# Patient Record
Sex: Male | Born: 1955 | Race: White | Hispanic: No | Marital: Single | Smoking: Former smoker
Health system: Southern US, Community
[De-identification: ages and names within clinical notes are randomized; demographics above are authoritative.]

## PROBLEM LIST (undated history)

## (undated) DIAGNOSIS — I10 Essential (primary) hypertension: Secondary | ICD-10-CM

## (undated) DIAGNOSIS — I428 Other cardiomyopathies: Secondary | ICD-10-CM

## (undated) DIAGNOSIS — I2510 Atherosclerotic heart disease of native coronary artery without angina pectoris: Secondary | ICD-10-CM

## (undated) DIAGNOSIS — N400 Enlarged prostate without lower urinary tract symptoms: Secondary | ICD-10-CM

## (undated) DIAGNOSIS — I4891 Unspecified atrial fibrillation: Secondary | ICD-10-CM

## (undated) DIAGNOSIS — I5020 Unspecified systolic (congestive) heart failure: Secondary | ICD-10-CM

## (undated) DIAGNOSIS — I700 Atherosclerosis of aorta: Secondary | ICD-10-CM

## (undated) DIAGNOSIS — J439 Emphysema, unspecified: Secondary | ICD-10-CM

## (undated) DIAGNOSIS — N183 Chronic kidney disease, stage 3 unspecified: Secondary | ICD-10-CM

## (undated) DIAGNOSIS — Z87891 Personal history of nicotine dependence: Secondary | ICD-10-CM

## (undated) DIAGNOSIS — E785 Hyperlipidemia, unspecified: Secondary | ICD-10-CM

## (undated) DIAGNOSIS — M7020 Olecranon bursitis, unspecified elbow: Secondary | ICD-10-CM

---

## 1977-02-14 HISTORY — PX: HAND SURGERY: SHX662

## 1998-01-05 ENCOUNTER — Ambulatory Visit (HOSPITAL_COMMUNITY): Admission: RE | Admit: 1998-01-05 | Discharge: 1998-01-05 | Payer: Self-pay | Admitting: *Deleted

## 1999-01-25 ENCOUNTER — Ambulatory Visit (HOSPITAL_COMMUNITY): Admission: RE | Admit: 1999-01-25 | Discharge: 1999-01-25 | Payer: Self-pay | Admitting: *Deleted

## 2000-11-09 ENCOUNTER — Encounter: Payer: Self-pay | Admitting: Internal Medicine

## 2000-11-09 ENCOUNTER — Ambulatory Visit (HOSPITAL_COMMUNITY): Admission: RE | Admit: 2000-11-09 | Discharge: 2000-11-09 | Payer: Self-pay | Admitting: Internal Medicine

## 2001-07-12 ENCOUNTER — Ambulatory Visit (HOSPITAL_COMMUNITY): Admission: RE | Admit: 2001-07-12 | Discharge: 2001-07-12 | Payer: Self-pay | Admitting: Internal Medicine

## 2001-07-12 ENCOUNTER — Encounter: Payer: Self-pay | Admitting: Internal Medicine

## 2002-02-21 ENCOUNTER — Ambulatory Visit (HOSPITAL_COMMUNITY): Admission: RE | Admit: 2002-02-21 | Discharge: 2002-02-21 | Payer: Self-pay | Admitting: Internal Medicine

## 2002-02-21 ENCOUNTER — Encounter: Payer: Self-pay | Admitting: Internal Medicine

## 2003-02-26 ENCOUNTER — Ambulatory Visit (HOSPITAL_COMMUNITY): Admission: RE | Admit: 2003-02-26 | Discharge: 2003-02-26 | Payer: Self-pay | Admitting: Internal Medicine

## 2010-02-14 DIAGNOSIS — I219 Acute myocardial infarction, unspecified: Secondary | ICD-10-CM

## 2010-02-14 HISTORY — DX: Acute myocardial infarction, unspecified: I21.9

## 2010-11-15 DIAGNOSIS — I428 Other cardiomyopathies: Secondary | ICD-10-CM

## 2010-11-15 HISTORY — DX: Other cardiomyopathies: I42.8

## 2010-12-08 ENCOUNTER — Inpatient Hospital Stay (HOSPITAL_COMMUNITY)
Admission: EM | Admit: 2010-12-08 | Discharge: 2010-12-11 | DRG: 286 | Disposition: A | Discharge status: Home / Self Care | Payer: Managed Care, Other (non HMO) | Source: Ambulatory Visit | Attending: Internal Medicine | Admitting: Internal Medicine

## 2010-12-08 ENCOUNTER — Emergency Department (HOSPITAL_COMMUNITY): Payer: Managed Care, Other (non HMO)

## 2010-12-08 DIAGNOSIS — R0602 Shortness of breath: Secondary | ICD-10-CM

## 2010-12-08 DIAGNOSIS — R0789 Other chest pain: Secondary | ICD-10-CM | POA: Diagnosis present

## 2010-12-08 DIAGNOSIS — Z7982 Long term (current) use of aspirin: Secondary | ICD-10-CM

## 2010-12-08 DIAGNOSIS — R0609 Other forms of dyspnea: Secondary | ICD-10-CM

## 2010-12-08 DIAGNOSIS — Z87891 Personal history of nicotine dependence: Secondary | ICD-10-CM

## 2010-12-08 DIAGNOSIS — I2510 Atherosclerotic heart disease of native coronary artery without angina pectoris: Secondary | ICD-10-CM | POA: Diagnosis present

## 2010-12-08 DIAGNOSIS — J9600 Acute respiratory failure, unspecified whether with hypoxia or hypercapnia: Secondary | ICD-10-CM | POA: Diagnosis present

## 2010-12-08 DIAGNOSIS — R0989 Other specified symptoms and signs involving the circulatory and respiratory systems: Secondary | ICD-10-CM

## 2010-12-08 DIAGNOSIS — I5023 Acute on chronic systolic (congestive) heart failure: Principal | ICD-10-CM | POA: Diagnosis present

## 2010-12-08 DIAGNOSIS — E785 Hyperlipidemia, unspecified: Secondary | ICD-10-CM | POA: Diagnosis present

## 2010-12-08 DIAGNOSIS — N400 Enlarged prostate without lower urinary tract symptoms: Secondary | ICD-10-CM | POA: Diagnosis present

## 2010-12-08 DIAGNOSIS — I428 Other cardiomyopathies: Secondary | ICD-10-CM | POA: Diagnosis present

## 2010-12-08 DIAGNOSIS — I509 Heart failure, unspecified: Secondary | ICD-10-CM | POA: Diagnosis present

## 2010-12-08 LAB — CBC
HCT: 42.2 % (ref 39.0–52.0)
Hemoglobin: 14.7 g/dL (ref 13.0–17.0)
MCH: 31.7 pg (ref 26.0–34.0)
MCHC: 34.8 g/dL (ref 30.0–36.0)
RDW: 13.5 % (ref 11.5–15.5)

## 2010-12-08 LAB — CARDIAC PANEL(CRET KIN+CKTOT+MB+TROPI)
CK, MB: 6 ng/mL — ABNORMAL HIGH (ref 0.3–4.0)
CK, MB: 4.8 ng/mL — ABNORMAL HIGH (ref 0.3–4.0)
Relative Index: 2.4 (ref 0.0–2.5)
Troponin I: <0.3 ng/mL (ref <0.30)
Troponin I: <0.3 ng/mL (ref <0.30)

## 2010-12-08 LAB — LIPID PANEL
Cholesterol: 264 mg/dL — ABNORMAL HIGH (ref 0–200)
Triglycerides: 132 mg/dL (ref <150)

## 2010-12-08 LAB — BASIC METABOLIC PANEL
BUN: 19 mg/dL (ref 6–23)
CO2: 21 mEq/L (ref 19–32)
Chloride: 111 mEq/L (ref 96–112)
Creatinine, Ser: 1.11 mg/dL (ref 0.50–1.35)

## 2010-12-08 LAB — DIFFERENTIAL
Basophils Absolute: 0 10*3/uL (ref 0.0–0.1)
Basophils Relative: 0 % (ref 0–1)
Eosinophils Absolute: 0.1 10*3/uL (ref 0.0–0.7)
Eosinophils Relative: 1 % (ref 0–5)
Lymphocytes Relative: 21 % (ref 12–46)
Lymphs Abs: 2.3 10*3/uL (ref 0.7–4.0)
Monocytes Absolute: 0.6 10*3/uL (ref 0.1–1.0)
Monocytes Relative: 5 % (ref 3–12)
Neutro Abs: 7.6 10*3/uL (ref 1.7–7.7)
Neutrophils Relative %: 72 % (ref 43–77)

## 2010-12-08 LAB — POCT I-STAT TROPONIN I: Troponin i, poc: 0.06 ng/mL (ref 0.00–0.08)

## 2010-12-08 LAB — TSH: TSH: 0.945 u[IU]/mL (ref 0.350–4.500)

## 2010-12-08 MED ORDER — IOHEXOL 300 MG/ML  SOLN
80.0000 mL | Freq: Once | INTRAMUSCULAR | Status: AC | PRN
  Administered 2010-12-08: 80 mL via INTRAVENOUS

## 2010-12-09 LAB — BASIC METABOLIC PANEL
BUN: 19 mg/dL (ref 6–23)
CO2: 25 mEq/L (ref 19–32)
Calcium: 9.9 mg/dL (ref 8.4–10.5)
Chloride: 105 mEq/L (ref 96–112)
Creatinine, Ser: 1.36 mg/dL — ABNORMAL HIGH (ref 0.50–1.35)
Creatinine, Ser: 1.13 mg/dL (ref 0.50–1.35)
GFR calc Af Amer: 65 mL/min — ABNORMAL LOW (ref >90)
Glucose, Bld: 97 mg/dL (ref 70–99)
Potassium: 4.2 mEq/L (ref 3.5–5.1)

## 2010-12-09 LAB — URINE DRUGS OF ABUSE SCREEN W ALC, ROUTINE (REF LAB)
Barbiturate Quant, Ur: NEGATIVE
Benzodiazepines.: NEGATIVE
Ethyl Alcohol: <10 mg/dL (ref <10)
Phencyclidine (PCP): NEGATIVE

## 2010-12-09 LAB — PRO B NATRIURETIC PEPTIDE: Pro B Natriuretic peptide (BNP): 1680 pg/mL — ABNORMAL HIGH (ref 0–125)

## 2010-12-09 LAB — CBC
Platelets: 182 10*3/uL (ref 150–400)
RDW: 13.7 % (ref 11.5–15.5)
WBC: 9 10*3/uL (ref 4.0–10.5)

## 2010-12-09 LAB — CARDIAC PANEL(CRET KIN+CKTOT+MB+TROPI): Troponin I: <0.3 ng/mL (ref <0.30)

## 2010-12-10 DIAGNOSIS — I5021 Acute systolic (congestive) heart failure: Secondary | ICD-10-CM

## 2010-12-10 LAB — BASIC METABOLIC PANEL
BUN: 21 mg/dL (ref 6–23)
Calcium: 9.9 mg/dL (ref 8.4–10.5)
Creatinine, Ser: 1.31 mg/dL (ref 0.50–1.35)
GFR calc non Af Amer: 59 mL/min — ABNORMAL LOW (ref >90)
Glucose, Bld: 99 mg/dL (ref 70–99)

## 2010-12-10 LAB — POCT I-STAT 3, VENOUS BLOOD GAS (G3P V)
Bicarbonate: 24.4 mEq/L — ABNORMAL HIGH (ref 20.0–24.0)
TCO2: 26 mmol/L (ref 0–100)

## 2010-12-10 LAB — POCT I-STAT 3, ART BLOOD GAS (G3+)
Acid-base deficit: 1 mmol/L (ref 0.0–2.0)
O2 Saturation: 91 %
TCO2: 24 mmol/L (ref 0–100)

## 2010-12-10 NOTE — H&P (Signed)
Austin [name], Austin [name]              ACCOUNT NO.:  [id number]  MEDICAL RECORD NO.:  [id number]  LOCATION:  3731                         FACILITY:  [facility]  PHYSICIAN:  Isidor Holts, M.D.  DATE OF BIRTH:  [birth year]  DATE OF ADMISSION:  12/08/2010 DATE OF DISCHARGE:                             HISTORY & PHYSICAL   PRIMARY CARE PHYSICIAN:  None.  Previously was with Dr. Lovenia Kim at [facility].  CHIEF COMPLAINT:  Chest pain, shortness of breath, and cough productive of yellowish/blood-streaked phlegm for the past 4 days.  HISTORY OF PRESENT ILLNESS:  This is a 56 year old male.  He is an excellent historian.  According to him, over the past 4 days, he has experienced increasing shortness of breath while doing his regular work. He works as a Product manager, making deliveries, and according to him he has been unable to "catch my breath" associated with chest discomfort, but no dysphagia.  He has also noticed retrosternal and left- sided chest pain on and off for the past 4 days, duration approximately 8-10 minutes at each time, partially relieved by cough, but immediately recurrent.  The cough is productive of yellowish phlegm with blood streaks.  He denies any chills, but has felt "feverish".  Denies any sick contacts.  Has had no lower extremity pain or swelling.  He also has occasional abdominal pain at the same time as his chest pain, but has no diarrhea or vomiting.  He has tried to continue working through today, since he will be off from tomorrow; however, at about 06:20 a.m. while at [facility] making deliveries, he was so symptomatic that a member of the [facility] staff called the [facility] police, who then called emergency medical services and the patient was brought to the emergency department at [facility], where his chest pain responded to sublingual nitroglycerin x2.  Chest CT angiogram done on suspicion of possible pulmonary embolism, was negative for such,  although there were some other abnormalities; for details see below.  The patient was then referred to the medical service for admission.  PAST MEDICAL HISTORY: 1. Benign prostatic hypertrophy. 2. Dyslipidemia. 3. Status post left hand surgery for left hand machine trauma     approximately 33 years ago.  ALLERGIES:  NO KNOWN DRUG ALLERGIES.  MEDICATIONS:  The patient is not currently on any medication.  REVIEW OF SYSTEMS:  Systems review as per HPI and chief complaint.  The patient denies headache.  Denies photophobia or neck stiffness.  Denies vomiting or diarrhea.  Denies back pain.  The rest of systems review is negative.  SOCIAL HISTORY:  The patient is single, has no offspring.  He works as a Product manager.  Ex-smoker, quit smoking approximately 7 years ago.  Prior to that, he was smoking 2 packets of cigarettes per day for the past 14 years.  Drinks alcohol only very occasionally, maybe once a month.  Has no history of drug abuse.  FAMILY HISTORY:  The patient's mother died at age 17 years, from breast cancer.  She had no other known medical problems.  His father died when he was at a very young age, so he does not know his  father's history.  He does have 2 younger brothers, 1 died at age 49 years from a massive heart attack.  The second is aged 69 years now.  He had an MI at age 46 years.  PHYSICAL EXAMINATION:  VITAL SIGNS:  Temperature 97.6, pulse 90 per minute regular, respiratory rate 19, BP 127/92 mmHg, rechecked 145/92 mmHg, pulse oximeter 99% on room air. GENERAL:  The patient appeared somewhat anxious, mildly short of breath on exertion at the time of this evaluation.  Alert and communicative. HEENT:  No clinical pallor.  No jaundice.  No conjunctival injection. Throat is clear. NECK:  Supple.  JVP not seen.  No palpable lymphadenopathy.  No palpable goiter. CHEST:  Clinically clear to auscultation.  No wheezes.  No crackles. CARDIOVASCULAR:  Heart  sounds 1 and 2 heard, normal.  Regular, mildly tachycardic.  No murmurs. ABDOMEN:  Full, soft, and nontender.  No palpable organomegaly.  No palpable masses.  Normal bowel sounds. EXTREMITIES:  Lower extremity examination, no pitting edema.  Palpable peripheral pulses. MUSCULOSKELETAL SYSTEM:  Appears quite unremarkable. CENTRAL NERVOUS SYSTEM:  No focal neurologic deficits on gross examination.  INVESTIGATIONS:  CBC WBC 10.6, hemoglobin 14.7, hematocrit 42.2, and platelets 209.  Electrolytes sodium 142, potassium 3.8, chloride 111, CO2 21, BUN 19, creatinine 1.11, and glucose 110.  Troponin I point of care 0.07.  Chest x-ray from December 08, 2010, shows left lower lobe atelectasis versus infiltrate as well as small bilateral pleural effusions.  Chest CT angiogram December 08, 2010, shows no evidence of pulmonary embolism, but there were moderate bilateral pleural effusions with airspace disease likely secondary to atelectasis.  There is minimal focal opacification of the right upper lobe.  A 12-lead EKG December 08, 2010, shows sinus rhythm, regular, normal axis, no acute ischemic findings.  Rate 100 per minute.  ASSESSMENT AND PLAN: 1. Community-acquired pneumonia.  The patient has borderline elevated     white cell count, subjective fevers, productive cough with yellow     phlegm, and blood streaks, also suspicious chest x-ray and chest CT     angiogram.  We shall therefore commence the patient on intravenous     Rocephin and azithromycin.  Manage this with albuterol nebulizers.     For completeness, we shall send blood cultures.  2. Bilateral moderate pleural effusions seen on chest CT angiogram.     Etiology unclear.  Might be parapneumonic and may be contributory     to shortness of breath.  Clinically, the patient does not appear to     have congestive heart failure; however, we will discuss with     Radiologist and possibly consider diagnostic/therapeutic     thoracentesis.   For completeness, we shall check TSH.  3. Chest pain.  This has atypical features and I suspect it may be     secondary to community-acquired pneumonia.  However, the patient     has had relief with sublingual nitroglycerin, and has risk factors     including dyslipidemia, positive family history of early coronary     artery disease.  He will therefore need stratification.  We shall     cycle cardiac enzymes, monitor telemetrically, and continue     Nitropaste for now.  Do a 2D echocardiogram.    Further management will depend on clinical course.     Isidor Holts, M.D.    CO/MEDQ  D:  12/08/2010  T:  12/08/2010  Job:  [id number]  Electronically Signed by Isidor Holts M.D. on  12/10/2010 07:04:53 PM

## 2010-12-11 ENCOUNTER — Inpatient Hospital Stay (HOSPITAL_COMMUNITY): Payer: Managed Care, Other (non HMO)

## 2010-12-11 LAB — BASIC METABOLIC PANEL
BUN: 22 mg/dL (ref 6–23)
CO2: 27 mEq/L (ref 19–32)
Calcium: 10.1 mg/dL (ref 8.4–10.5)
Chloride: 104 mEq/L (ref 96–112)
Creatinine, Ser: 1.31 mg/dL (ref 0.50–1.35)
Glucose, Bld: 106 mg/dL — ABNORMAL HIGH (ref 70–99)

## 2010-12-14 LAB — CULTURE, BLOOD (ROUTINE X 2)
Culture  Setup Time: 201210242101
Culture: NO GROWTH

## 2010-12-17 ENCOUNTER — Ambulatory Visit (HOSPITAL_COMMUNITY)
Admit: 2010-12-17 | Discharge: 2010-12-17 | Disposition: A | Discharge status: Home / Self Care | Payer: Managed Care, Other (non HMO) | Source: Ambulatory Visit | Attending: Internal Medicine | Admitting: Internal Medicine

## 2010-12-17 ENCOUNTER — Encounter (HOSPITAL_COMMUNITY): Payer: Self-pay

## 2010-12-17 ENCOUNTER — Encounter (HOSPITAL_COMMUNITY): Payer: Self-pay | Admitting: *Deleted

## 2010-12-17 VITALS — BP 88/62 | HR 65 | Ht 76.0 in | Wt 217.1 lb

## 2010-12-17 DIAGNOSIS — I5022 Chronic systolic (congestive) heart failure: Secondary | ICD-10-CM | POA: Insufficient documentation

## 2010-12-17 DIAGNOSIS — I959 Hypotension, unspecified: Secondary | ICD-10-CM | POA: Insufficient documentation

## 2010-12-17 DIAGNOSIS — I5023 Acute on chronic systolic (congestive) heart failure: Secondary | ICD-10-CM | POA: Insufficient documentation

## 2010-12-17 HISTORY — DX: Other cardiomyopathies: I42.8

## 2010-12-17 HISTORY — DX: Benign prostatic hyperplasia without lower urinary tract symptoms: N40.0

## 2010-12-17 HISTORY — DX: Hyperlipidemia, unspecified: E78.5

## 2010-12-17 LAB — BASIC METABOLIC PANEL
BUN: 40 mg/dL — ABNORMAL HIGH (ref 6–23)
CO2: 24 mEq/L (ref 19–32)
Chloride: 101 mEq/L (ref 96–112)
Creatinine, Ser: 1.35 mg/dL (ref 0.50–1.35)
GFR calc Af Amer: 66 mL/min — ABNORMAL LOW (ref >90)
Glucose, Bld: 95 mg/dL (ref 70–99)
Potassium: 5 mEq/L (ref 3.5–5.1)

## 2010-12-17 MED ORDER — POTASSIUM CHLORIDE CRYS ER 20 MEQ PO TBCR: 20.0000 meq | EXTENDED_RELEASE_TABLET | Freq: Every day | ORAL | Status: DC

## 2010-12-17 MED ORDER — LISINOPRIL 10 MG PO TABS: 5.0000 mg | ORAL_TABLET | Freq: Two times a day (BID) | ORAL | Status: DC

## 2010-12-17 MED ORDER — FUROSEMIDE 40 MG PO TABS: 40.0000 mg | ORAL_TABLET | Freq: Every day | ORAL | Status: DC

## 2010-12-17 NOTE — Consult Note (Signed)
Austin [name]              ACCOUNT NO.:  [id number]  MEDICAL RECORD NO.:  [id number]  LOCATION:  3731                         FACILITY:  [facility]  PHYSICIAN:  Austin [name], MDDATE OF BIRTH:  August 31, 1955  DATE OF CONSULTATION:  12/08/2010 DATE OF DISCHARGE:                                CONSULTATION   REQUESTING PHYSICIAN:  Austin Holts, MD  PRIMARY CARE PHYSICIAN:  None.  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  He presented to the hospital with a 4-day history of increasing shortness of breath, dyspnea, and orthopnea.  He said that he is usually quite active and felt fine until over the weekend and over the past few days, he has noticed that he has become more dyspneic with exertion.  He also has been unable to lie flat and having to sit straight up in bed. He also notes that he has had a very severe cough.  He says he has coughed up a couple of episodes of blood-streaked phlegm over the past 24 hours.  He says occasionally, it is yellow, but often clear.  He does not have any chest pain, but he says he feels like something is stuck in his throat.  He denies any lower extremity edema.  He says his weight has been relatively stable.  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  He was brought to the ER.  EKG showed sinus tachycardia.  First set of cardiac markers were normal.  Chest x-ray showed some atelectasis and bilateral pleural effusions.  He subsequently underwent a CT scan of the chest which showed moderate-to- large bilateral pleural effusions and some atelectasis.  A BNP was obtained which was markedly elevated at 1948.  REVIEW OF SYSTEMS:  He denies fevers or chills.  No nausea, no vomiting, no diarrhea.  No rashes.  No joint swelling.   He does have some arthritis pain.  Remainder of review of systems, all systems are negative except for HPI and problem list.  PAST MEDICAL HISTORY: 1. Hyperlipidemia. 2. History of sinus troubles. 3. Benign prostatic hypertrophy.  He went to a cardiologist in [place] about 5 or 10 years ago and had a normal stress test and echocardiogram which were just done as part of screening.  CURRENT MEDICINES:  Just a cholesterol medicine which does not appear like he was taking.  ALLERGIES:  No known drug allergies.  SOCIAL HISTORY:  He lives in [place].  He is single.  He drives a refrigerator truck.  Denies any current tobacco use, he quit 16 years ago.  Alcohol very rarely.  FAMILY HISTORY:  Mother died of breast cancer at age 51.  Father died of bladder cancer at age 2.  Had a brother who died suddenly at age 3 and another brother who had an MI at age 58, brother is currently 60.  PHYSICAL EXAMINATION:  GENERAL:  He is dyspneic at rest. VITAL SIGNS:  Blood pressure is 145/93, his heart rate is  110, his respirations are about 20 per minute, he is sating 97% on 2 L. HEENT:  Normal. NECK:  Supple.  JVP is elevated about 8-9 cm with prominent CV waves. There is also hepatojugular reflux.  Carotids are 1+ bilaterally without bruits.  There is no lymphadenopathy or thyromegaly. CARDIAC:  PMI is laterally displaced.  He is tachycardic and regular. He has a prominent S3. HEART:  His heart sounds are distant, though I do not hear any murmurs. LUNGS:  Bibasilar crackles. ABDOMEN:  Obese, mildly distended.  Liver edge appears down several centimeters.  They are otherwise nontender.  Good bowel sounds. EXTREMITIES:  Warm with no cyanosis, clubbing, or edema.  No rash. NEUROLOGIC:  Alert and oriented x3.  Cranial nerves II through XII are intact.  Moves all 4 extremities without difficulty.  Affect is pleasant.  EKG shows sinus tach at a rate of 100.  There is LVH with a  nonspecific IVCD.  There is left axis deviation.  No acute ST-T-wave abnormalities. First set of cardiac markers; CK 221, MB of 6.1, troponin is 0.07. White count is 10.6, hemoglobin 14.7, platelet 209.  Sodium 143, potassium 3.8, BUN 19, creatinine 1.11, glucose of 110.  Cholesterol is 264, triglycerides 132, HDL 46, LDL 192.  ASSESSMENT: 1. Acute respiratory failure. 2. Acute congestive heart failure, probably systolic. 3. Hyperlipidemia. 4. Bilateral pleural effusions, suspect related to heart failure.  PLAN/DISCUSSION:  I suspect [name] has acute systolic heart failure.  He has significant volume overload.  I do not see any evidence of pneumonia.  We will start with IV Lasix and also ACE inhibitor.  I worry he has significant systolic dysfunction.  We will get an echocardiogram on him hopefully tonight.  I suspect he will also need a right and left heart catheterization once his volume status is improved. I would hold on beta-blocker for now until his respiratory status is improved.  We will go ahead and start digoxin.  TOTAL TIME SPENT:  One hour in reviewing records and examining and discussing with the patient.Austin Buckles. Bensimhon, MD     DRB/MEDQ  D:  12/08/2010  T:  12/08/2010  Job:  [id number]  Electronically Signed by Arvilla Meres MD on 12/17/2010 02:09:45 PM

## 2010-12-17 NOTE — Assessment & Plan Note (Signed)
Asymptomatic, doppler BP 92.  Will decrease medications as above.

## 2010-12-17 NOTE — Patient Instructions (Signed)
Labs today.  Decrease Lasix 40 mg daily. Decrease KCl 20 mEq daily. Decrease Lisinopril 5 mg (0.5 tab) twice daily.    Call if dizziness/lightheadnesses worsens or any questions.  Continue to weigh and record daily weights.  Return for follow up in 2 weeks.

## 2010-12-17 NOTE — Progress Notes (Signed)
HPI:  Austin [name] is a 55 yo gentlemen with history of BPH and hyperlipidemia and recently diagnosed systolic HF, EF 20% in October 2012.  Dr. Shirlee Latch took the pt to the cath lab on 12/10/2010 for right and left heart catheterizations showing: Right atrial pressure 7, RV 23/7, PA 30/17, mean PCWP 18, CO by Fick 5.2, CI by Fick 2.2, CO by thermodilution 3.61 and CI by thermodilution 1.55; RCA normal, left main with mild narrowing distal portion 20%, LAD luminal irregularities, circumflex mild luminal irregularities; EF 15%-20%.  He diuresed well with discharge rate of 102 kg.  He was discharged on Aspirin 81, Carvedilol 3.125 mg twice daily, Digoxin 0.25 mg daily, Lasix 40 mg b.i.d, Lisinopril 10 mg twice daily, Potassium 20 mEq 2 tablets b.i.d, Crestor 40 mg daily and Spironolactone 12.5 mg daily.  Returns for follow up today.  Feels fatigued but doing well.  Feels he does tire easier than prior to the hospital.  Walking about 1 mile daily, sometimes he needs to stop prior to getting back home.  No SOB/orthopnea/PND.  Occasional dizziness with standing.  Nonproductive cough.  Weighing daily at [facility] because he does not have a scale at home, weight constant 221.  He is ready to go back to work as he is not use to sitting around the house all day.    ROS: All systems negative except as listed in HPI, PMH and Problem List.  Past Medical History  Diagnosis Date  . Systolic heart failure Oct 2012    echo 12/08/10 EF 20%, severe left ventricular enlargement, mild right ventricular enlargement  . Nonischemic cardiomyopathy Oct 2012    cath 12/10/10 minimal nonobstructive coronary artery disease  . Hyperlipidemia   . Benign prostatic hypertrophy     Current Outpatient Prescriptions  Medication Sig Dispense Refill  . aspirin 81 MG tablet Take 81 mg by mouth daily.        . carvedilol (COREG) 3.125 MG tablet Take 3.125 mg by mouth 2 (two) times daily with a meal.        . digoxin (LANOXIN) 0.25 MG  tablet Take 250 mcg by mouth daily.        . furosemide (LASIX) 40 MG tablet Take 40 mg by mouth 2 (two) times daily.        Marland Kitchen lisinopril (PRINIVIL,ZESTRIL) 10 MG tablet Take 10 mg by mouth 2 (two) times daily.        . potassium chloride SA (K-DUR,KLOR-CON) 20 MEQ tablet Take 40 mEq by mouth 2 (two) times daily.        . rosuvastatin (CRESTOR) 40 MG tablet Take 40 mg by mouth daily.        Marland Kitchen spironolactone (ALDACTONE) 25 MG tablet Take 12.5 mg by mouth daily.           PHYSICAL EXAM: Filed Vitals:   12/17/10 1250  BP: 88/62  Pulse: 65  Wt 217 Doppler BP:92 Standing BP: 78/64  General:  Well appearing. No resp difficulty HEENT: normal Neck: supple. JVP flat. Carotids 1+ bilaterally; no bruits. No lymphadenopathy or thryomegaly appreciated. Cor: PMI normal. Regular rate & rhythm. No rubs, gallops or murmurs. No S3 Lungs: clear Abdomen: soft, nontender, nondistended. No hepatosplenomegaly. No bruits or masses. Good bowel sounds. Extremities: no cyanosis, clubbing, rash, edema Neuro: alert & orientedx3, cranial nerves grossly intact. Moves all 4 extremities w/o difficulty. Affect pleasant.     ASSESSMENT & PLAN:

## 2010-12-17 NOTE — Assessment & Plan Note (Addendum)
He continues to feel fatigued.  Volume status looks good.  NYHA III.  Would recommend decreasing lasix to 40 mg daily, KCL 20 mEq daily and Lisinopril 5 mg BID with hypotension and dizziness.  Have given patient a scale and discussed the importance of daily weights and recording them daily, he is very agreeable to this.   Patient seen and examined with Ulyess Blossom PA-C. We discussed all aspects of the encounter. I agree with the assessment and plan as stated above.

## 2010-12-20 NOTE — Progress Notes (Signed)
Patient seen and examined with Nicki Bradley PA-C. We discussed all aspects of the encounter. I agree with the assessment and plan as stated above.   

## 2010-12-25 NOTE — Discharge Summary (Signed)
NAMEJONAVON, TRIEU NO.:  [id number]  MEDICAL RECORD NO.:  [id number]  LOCATION:  3731                         FACILITY:  [facility]  PHYSICIAN:  Luis Abed, MD, FACCDATE OF BIRTH:  [birth year]  DATE OF ADMISSION:  12/08/2010 DATE OF DISCHARGE:  12/11/2010                              DISCHARGE SUMMARY   PRIMARY CARDIOLOGIST:  Bevelyn Buckles. Bensimhon, MD with the [facility]  REASON FOR ADMISSION:  Acute congestive heart failure.  DISCHARGE DIAGNOSES: 1. Acute systolic congestive heart failure - compensated prior to     discharge. 2. Nonischemic cardiomyopathy diagnosed this admission. 3. a.  2-D echocardiogram, December 08, 2010:  Severe left ventricular     enlargement, ejection fraction 20%, mild right ventricular     enlargement, mildly reduced right ventricular systolic function,     mild heart right atrial enlargement. 4. Minimal nonobstructive coronary artery disease by cardiac     catheterization this admission. 5. Hyperlipidemia. 6. Benign prostatic hypertrophy. 7. Bilateral pleural effusions - improved prior to discharge.  PROCEDURE PERFORMED THIS ADMISSION: Right and left heart catheterizations by Dr. Marca Ancona, December 10, 2010:  Right atrial pressure 7, RV 23/7, PA 30/17, mean PCWP 18, CO by Fick 5.2, CI by Fick 2.2, CO by thermodilution 3.61 and CI by thermodilution 1.55; RCA normal, left main with mild narrowing distal portion 20%, LAD luminal irregularities, circumflex mild luminal irregularities; EF 15%-20%.  HOSPITAL COURSE:  Mr. Alexa is a 55 year old male with no recent regular medical followups who does have a history of BPH and hyperlipidemia.  He presented to the hospital with 4-day history of increased shortness of breath and orthopnea.  Chest x-ray on admission demonstrated left lower lobe atelectasis versus infiltrate and small bilateral pleural effusions.  CT angiogram of the chest demonstrated  no pulmonary embolism, but moderate bilateral pleural effusions and associated airspace disease, likely reflecting atelectasis; minimal focal opacification in right upper lobe, likely reflecting atelectasis as well.  He was initially admitted by the Internal Medicine Service. They put him on antibiotics empirically for possible pneumonia.  CHF Service was asked to see the patient.  Echocardiogram demonstrated severely reduced LV function with an EF of 20%.  He was diuresed.  CHF medications were titrated as they could be tolerated.  He was set up for cardiac catheterization done on December 10, 2010, which demonstrated minimal nonobstructive CAD.  He was felt to have a nonischemic cardiomyopathy.  He was evaluated by Dr. Myrtis Ser on the morning of December 11, 2010 and felt to be stable enough for discharge to home.  Followup chest x-ray demonstrated improving left lower lobe atelectasis or infiltrate and no acute disease.  The patient will follow up in the [facility] next week.  DISCHARGE WEIGHT:  102 kg.  LABORATORIES AND ANCILLARY DATA: Hemoglobin 13.7.  Sodium 140, potassium 4.6, creatinine 1.31.  Cardiac markers negative x3.  BNP at discharge 457.3.  Total cholesterol 264, triglycerides 132, HDL 46, LDL 192.  TSH 0.945.  Urine drug screen positive for marijuana.  Blood cultures were negative x2 to date at discharge.  Echocardiogram, chest CT as noted above.  DISCHARGE MEDICATION: 1. Aspirin 81 mg  daily. 2. Carvedilol 3.125 mg twice daily. 3. Digoxin 0.25 mg daily. 4. Lasix 40 mg b.i.d. 5. Lisinopril 10 mg twice daily. 6. Potassium 20 mEq 2 tablets b.i.d. 7. Crestor 40 mg daily at bedtime. 8. Spironolactone 12.5 mg daily.  ALLERGIES:  No known drug allergies.  ACTIVITY:  The patient has been advised to increase activity slowly.  No lifting, driving, or sexual activity for 1 week.  He should not return to work until he sees Dr. [name] in followup next  week.  WOUND CARE:  He should call our office for any swelling, bleeding, bruising or fever.  FOLLOWUP: 1. With Dr. [name] in the [facility] on December 17, 2010     at 11:15 a.m. 2. He will need lipids and LFTs arranged in the next 6 weeks given the     initiation of Crestor. Total physician and PA time greater than 30 minutes on this discharge.     Tereso Newcomer, PA-C   ______________________________ Luis Abed, MD, [facility]    SW/MEDQ  D:  12/11/2010  T:  12/11/2010  Job:  [id number]  Electronically Signed by Tereso Newcomer PA-C on 12/18/2010 08:20:45 PM Electronically Signed by Willa Rough MD FACC on 12/25/2010 06:32:54 AM

## 2010-12-31 ENCOUNTER — Encounter (HOSPITAL_COMMUNITY): Payer: Self-pay | Admitting: Internal Medicine

## 2010-12-31 ENCOUNTER — Ambulatory Visit (HOSPITAL_COMMUNITY)
Admission: RE | Admit: 2010-12-31 | Discharge: 2010-12-31 | Disposition: A | Discharge status: Home / Self Care | Payer: Managed Care, Other (non HMO) | Source: Ambulatory Visit | Attending: Internal Medicine | Admitting: Internal Medicine

## 2010-12-31 VITALS — BP 108/82 | HR 68 | Wt 219.5 lb

## 2010-12-31 DIAGNOSIS — I5022 Chronic systolic (congestive) heart failure: Secondary | ICD-10-CM | POA: Insufficient documentation

## 2010-12-31 MED ORDER — CARVEDILOL 3.125 MG PO TABS: 6.2500 mg | ORAL_TABLET | Freq: Two times a day (BID) | ORAL | Status: DC

## 2010-12-31 NOTE — Progress Notes (Signed)
HPI:  [name] is a 55 yo gentlemen with history of BPH and hyperlipidemia and recently diagnosed systolic HF, EF 20% in October 2012.  Dr. Shirlee Latch took the pt to the cath lab on 12/10/2010 for right and left heart catheterizations showing: Right atrial pressure 7, RV 23/7, PA 30/17, mean PCWP 18, CO by Fick 5.2, CI by Fick 2.2, CO by thermodilution 3.61 and CI by thermodilution 1.55; RCA normal, left main with mild narrowing distal portion 20%, LAD luminal irregularities, circumflex mild luminal irregularities; EF 15%-20%.  He diuresed well with discharge rate of 102 kg.  He was discharged on Aspirin 81, Carvedilol 3.125 mg twice daily, Digoxin 0.25 mg daily, Lasix 40 mg b.i.d, Lisinopril 10 mg twice daily, Potassium 20 mEq 2 tablets b.i.d, Crestor 40 mg daily and Spironolactone 12.5 mg daily.  Last visit he was hypotensive/fatigue and occasional dizziness and we decreased lisinopril 5 mg BID, lasix 40 mg daily and KCl 20 mEq daily.  He returns today for follow up.  Continues to fatigue quicker than he use too but feels good.  He continues to walk almost 1 mile on most days but does not walk on cold days.  He is weighing daily, stable 211.5-214.  Has not taken extra lasix.  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  He is watching his sodium very closely and uses Ms[employer] now.  No SOB/orhtopnea or PND.  Dizziness improved.     Quit smoking many years ago.    ROS: All systems negative except as listed in HPI, PMH and Problem List.  Past Medical History  Diagnosis Date  . Systolic heart failure Oct 2012    echo 12/08/10 EF 20%, severe left ventricular enlargement, mild right ventricular enlargement  . Nonischemic cardiomyopathy Oct 2012    cath 12/10/10 minimal nonobstructive coronary artery disease  . Hyperlipidemia   . Benign prostatic hypertrophy     Current Outpatient Prescriptions   Medication Sig Dispense Refill  . aspirin 81 MG tablet Take 81 mg by mouth daily.        . carvedilol (COREG) 3.125 MG tablet Take 3.125 mg by mouth 2 (two) times daily with a meal.        . digoxin (LANOXIN) 0.25 MG tablet Take 250 mcg by mouth daily.        . furosemide (LASIX) 40 MG tablet Take 1 tablet (40 mg total) by mouth daily.      Marland Kitchen lisinopril (PRINIVIL,ZESTRIL) 10 MG tablet Take 0.5 tablets (5 mg total) by mouth 2 (two) times daily.      . potassium chloride SA (K-DUR,KLOR-CON) 20 MEQ tablet Take 1 tablet (20 mEq total) by mouth daily.      . rosuvastatin (CRESTOR) 40 MG tablet Take 40 mg by mouth daily.        Marland Kitchen spironolactone (ALDACTONE) 25 MG tablet Take 12.5 mg by mouth daily.           PHYSICAL EXAM: Filed Vitals:   12/31/10 1055  BP: 108/82  Pulse: 68  Wt 219   General:  Well appearing. No resp difficulty HEENT: normal Neck: supple. JVP flat. Carotids 1+ bilaterally; no bruits. No lymphadenopathy or thryomegaly appreciated. Cor: PMI normal. Regular rate & rhythm. No rubs, gallops or murmurs. No S3 Lungs: clear Abdomen: soft, nontender, nondistended. No hepatosplenomegaly. No bruits or masses. Good bowel sounds. Extremities: no cyanosis, clubbing,  rash, edema Neuro: alert & orientedx3, cranial nerves grossly intact. Moves all 4 extremities w/o difficulty. Affect pleasant.     ASSESSMENT & PLAN:

## 2010-12-31 NOTE — Assessment & Plan Note (Addendum)
Volume status looks good.  NYHA II.  At this time he is tolerating medications, will titrate coreg 6.25 mg BID.  Suspect his dry weight should stay around 219 (per our scales).  Had a long discussion regarding low sodium diet and allotment of 2 g sodium daily.  Encouraged him to keep walking.  With him using Mrs. Dash, Spironolactone, decrease in lasix, and recent K of 5 will discontinue KCl at this time.  Will recheck echo in January to reassess LVEF.

## 2010-12-31 NOTE — Patient Instructions (Addendum)
Stop taking potassium.  Increase coreg 6.25 mg (2tablets) twice daily.      Continue to weigh yourself daily and record weights.  Follow up in 3 weeks.

## 2011-01-06 NOTE — Progress Notes (Signed)
Patient seen and examined with Nicki Bradley PA-C. We discussed all aspects of the encounter. I agree with the assessment and plan as stated above.   

## 2011-01-24 ENCOUNTER — Encounter (HOSPITAL_COMMUNITY): Payer: Self-pay | Admitting: *Deleted

## 2011-01-24 ENCOUNTER — Encounter (HOSPITAL_COMMUNITY): Payer: Self-pay

## 2011-01-24 ENCOUNTER — Ambulatory Visit (HOSPITAL_COMMUNITY)
Admission: RE | Admit: 2011-01-24 | Discharge: 2011-01-24 | Disposition: A | Discharge status: Home / Self Care | Payer: Managed Care, Other (non HMO) | Source: Ambulatory Visit | Attending: Internal Medicine | Admitting: Internal Medicine

## 2011-01-24 VITALS — BP 122/82 | HR 81 | Wt 221.2 lb

## 2011-01-24 DIAGNOSIS — I5022 Chronic systolic (congestive) heart failure: Secondary | ICD-10-CM | POA: Insufficient documentation

## 2011-01-24 LAB — DIGOXIN LEVEL: Digoxin Level: 0.4 ng/mL — ABNORMAL LOW (ref 0.8–2.0)

## 2011-01-24 LAB — BASIC METABOLIC PANEL
CO2: 26 mEq/L (ref 19–32)
Chloride: 105 mEq/L (ref 96–112)
Creatinine, Ser: 1.09 mg/dL (ref 0.50–1.35)
Glucose, Bld: 109 mg/dL — ABNORMAL HIGH (ref 70–99)

## 2011-01-24 MED ORDER — CARVEDILOL 12.5 MG PO TABS: 12.5000 mg | ORAL_TABLET | Freq: Two times a day (BID) | ORAL | Status: DC

## 2011-01-24 MED ORDER — FUROSEMIDE 40 MG PO TABS: 40.0000 mg | ORAL_TABLET | Freq: Every day | ORAL | Status: DC

## 2011-01-24 MED ORDER — SPIRONOLACTONE 25 MG PO TABS: 25.0000 mg | ORAL_TABLET | Freq: Every day | ORAL | Status: DC

## 2011-01-24 MED ORDER — DIGOXIN 250 MCG PO TABS: 250.0000 ug | ORAL_TABLET | Freq: Every day | ORAL | Status: DC

## 2011-01-24 MED ORDER — CARVEDILOL 12.5 MG PO TABS: 6.2500 mg | ORAL_TABLET | Freq: Two times a day (BID) | ORAL | Status: DC

## 2011-01-24 MED ORDER — PRAVASTATIN SODIUM 40 MG PO TABS: 40.0000 mg | ORAL_TABLET | Freq: Every day | ORAL | Status: DC

## 2011-01-24 MED ORDER — LISINOPRIL 10 MG PO TABS: 5.0000 mg | ORAL_TABLET | Freq: Two times a day (BID) | ORAL | Status: DC

## 2011-01-24 NOTE — Patient Instructions (Signed)
Increase Carvedilol to 6.25 mg Twice daily (We gave you prescription for 12.5 mg Twice daily, PLEASE CUT TAB IN HALF AND TAKE 1/2 TAB Twice daily) Stop Crestor Start Pravastatin 40 mg daily  Your physician has requested that you have an echocardiogram. Echocardiography is a painless test that uses sound waves to create images of your heart. It provides your doctor with information about the size and shape of your heart and how well your heart's chambers and valves are working. This procedure takes approximately one hour. There are no restrictions for this procedure.  IN 1 MONTH.  Your physician recommends that you schedule a follow-up appointment in: 1 month

## 2011-01-24 NOTE — Progress Notes (Signed)
HPI:  [name] is a 55 yo gentlemen with history of BPH and hyperlipidemia and recently diagnosed systolic HF, EF 20% in October 2012.  Dr. Shirlee Latch took the pt to the cath lab on 12/10/2010 for right and left heart catheterizations showing: Right atrial pressure 7, RV 23/7, PA 30/17, mean PCWP 18, CO by Fick 5.2, CI by Fick 2.2, CO by thermodilution 3.61 and CI by thermodilution 1.55; RCA normal, left main with mild narrowing distal portion 20%, LAD luminal irregularities, circumflex mild luminal irregularities; EF 15%-20%.  He diuresed well with discharge rate of 102 kg.  He was discharged on Aspirin 81, Carvedilol 3.125 mg twice daily, Digoxin 0.25 mg daily, Lasix 40 mg b.i.d, Lisinopril 10 mg twice daily, Potassium 20 mEq 2 tablets b.i.d, Crestor 40 mg daily and Spironolactone 12.5 mg daily.  At last visit we asked him to increase carvedilol to 6.25 bid but he didn't do that due to confusion over his medication. Energy much improved. 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 Weigt now 217-221 (previously 212-214).  No SOB/orhtopnea/edema or PND.  Able to go up 3 flights up steps. Dizziness improved.  Appetite great.    ROS: All systems negative except as listed in HPI, PMH and Problem List.  Past Medical History  Diagnosis Date  . Systolic heart failure Oct 2012    echo 12/08/10 EF 20%, severe left ventricular enlargement, mild right ventricular enlargement  . Nonischemic cardiomyopathy Oct 2012    cath 12/10/10 minimal nonobstructive coronary artery disease  . Hyperlipidemia   . Benign prostatic hypertrophy     Current Outpatient Prescriptions  Medication Sig Dispense Refill  . aspirin 81 MG tablet Take 81 mg by mouth daily.        . digoxin (LANOXIN) 0.25 MG tablet Take 250 mcg by mouth daily.        . furosemide (LASIX) 40 MG tablet Take 1 tablet (40 mg total) by mouth daily.      Marland Kitchen lisinopril (PRINIVIL,ZESTRIL) 10 MG tablet Take 0.5 tablets (5 mg total) by mouth 2 (two)  times daily.      . rosuvastatin (CRESTOR) 40 MG tablet Take 40 mg by mouth daily.        Marland Kitchen spironolactone (ALDACTONE) 25 MG tablet Take 12.5 mg by mouth daily.        . carvedilol (COREG) 3.125 MG tablet Take 2 tablets (6.25 mg total) by mouth 2 (two) times daily with a meal.         PHYSICAL EXAM: Filed Vitals:   01/24/11 1242  BP: 122/87  Pulse: 81  Wt 219   General:  Well appearing. No resp difficulty HEENT: normal Neck: supple. JVP flat. Carotids 1+ bilaterally; no bruits. No lymphadenopathy or thryomegaly appreciated. Cor: PMI normal. Regular rate & rhythm. No rubs, gallops or murmurs. No S3 Lungs: clear Abdomen: soft, nontender, nondistended. No hepatosplenomegaly. No bruits or masses. Good bowel sounds. Extremities: no cyanosis, clubbing, rash, edema Neuro: alert & orientedx3, cranial nerves grossly intact. Moves all 4 extremities w/o difficulty. Affect pleasant.   ASSESSMENT & PLAN:

## 2011-01-24 NOTE — Progress Notes (Signed)
Encounter addended by: Noralee Space, RN on: 01/24/2011  1:28 PM<BR>     Documentation filed: Patient Instructions Section, Visit Diagnoses, Orders

## 2011-01-24 NOTE — Assessment & Plan Note (Signed)
Doing very well. NYHA I. Volume status looks good. Suspect weight gain due to adipose. I also suspect he will have complete recovery of his LV function. Increase carvedilol to 6.25 bid. See back 1 month with echo. Needs bloowork today. Reinforced need to be compliant with meds. We will have him get his meds at [facility] to save money.

## 2011-01-29 NOTE — Progress Notes (Signed)
Encounter addended by: Dolores Patty, MD on: 01/29/2011 12:09 AM<BR>     Documentation filed: Follow-up Section, LOS Section

## 2011-02-09 ENCOUNTER — Telehealth (HOSPITAL_COMMUNITY): Payer: Self-pay | Admitting: Physician Assistant

## 2011-02-09 NOTE — Telephone Encounter (Signed)
Patient received a letter from his insurance company today stated they are going to drop him from his plan if he doesn't pay 2 payments of $58.05.  He currently is not working full time and is barely able to keep his lights on and have money to eat.  He was calling to see what needed to be done.  He is going to go to the crisis center near his home this afternoon to see if they could help out, I agreed this was a good idea.  I will discuss the case with SW, Amy as she has worked with him in the past and call him back.  He was appreciative of the call back.

## 2011-02-11 NOTE — Telephone Encounter (Signed)
CSW spoke with patient about losing his insurance and the patient is going to try and borrow the money for his insurance from his family in [place] and pay them back when he starts work again. CSW encouraged patient to call back if he had anymore concerns

## 2011-02-11 NOTE — Telephone Encounter (Signed)
Amy Riley Kill is aware and will call pt

## 2011-02-11 NOTE — Telephone Encounter (Signed)
CSW contacted Mr. Robar and left a message. CSW will continue to follow up.

## 2011-03-01 ENCOUNTER — Ambulatory Visit (HOSPITAL_COMMUNITY)
Admission: RE | Admit: 2011-03-01 | Discharge: 2011-03-01 | Disposition: A | Discharge status: Home / Self Care | Payer: Managed Care, Other (non HMO) | Source: Ambulatory Visit | Attending: Internal Medicine | Admitting: Internal Medicine

## 2011-03-01 ENCOUNTER — Ambulatory Visit (HOSPITAL_COMMUNITY)
Admission: RE | Admit: 2011-03-01 | Discharge: 2011-03-01 | Disposition: A | Discharge status: Home / Self Care | Payer: PRIVATE HEALTH INSURANCE | Source: Ambulatory Visit | Attending: Internal Medicine | Admitting: Internal Medicine

## 2011-03-01 VITALS — BP 124/68 | HR 66 | Wt 227.5 lb

## 2011-03-01 DIAGNOSIS — R079 Chest pain, unspecified: Secondary | ICD-10-CM | POA: Insufficient documentation

## 2011-03-01 DIAGNOSIS — R0609 Other forms of dyspnea: Secondary | ICD-10-CM | POA: Insufficient documentation

## 2011-03-01 DIAGNOSIS — I509 Heart failure, unspecified: Secondary | ICD-10-CM | POA: Insufficient documentation

## 2011-03-01 DIAGNOSIS — I379 Nonrheumatic pulmonary valve disorder, unspecified: Secondary | ICD-10-CM | POA: Insufficient documentation

## 2011-03-01 DIAGNOSIS — I5022 Chronic systolic (congestive) heart failure: Secondary | ICD-10-CM

## 2011-03-01 DIAGNOSIS — I059 Rheumatic mitral valve disease, unspecified: Secondary | ICD-10-CM | POA: Insufficient documentation

## 2011-03-01 DIAGNOSIS — I079 Rheumatic tricuspid valve disease, unspecified: Secondary | ICD-10-CM | POA: Insufficient documentation

## 2011-03-01 DIAGNOSIS — I369 Nonrheumatic tricuspid valve disorder, unspecified: Secondary | ICD-10-CM

## 2011-03-01 DIAGNOSIS — R0989 Other specified symptoms and signs involving the circulatory and respiratory systems: Secondary | ICD-10-CM | POA: Insufficient documentation

## 2011-03-01 NOTE — Assessment & Plan Note (Addendum)
Appears EF is improving on preliminary review of echo today.  Still with NYHA II-III symptoms.  Weight stable.  Will hold off on ICD at this time with EF improving.  Feel that he may have extra fluid on the days that he has increased fatigue/chest pressure.  Will have him take extra lasix for a weight of 222 lbs or above.  Would like him to stay around 220 lbs.    Patient seen and examined with Austin Blossom PA-C. We discussed all aspects of the encounter. I agree with the assessment and plan as stated above. Reviewed echo with him in clinic today. EF appears to be improving but still NYHA III. Continue current regimen as BP too soft to push harder at this time. Reinforced need for daily weights and reviewed use of sliding scale diuretics.

## 2011-03-01 NOTE — Progress Notes (Signed)
HPI:  Austin [name] is a 56 yo gentlemen with history of BPH and hyperlipidemia and recently diagnosed systolic HF, EF 20% in October 2012.  Dr. Shirlee Latch took the pt to the cath lab on 12/10/2010 for right and left heart catheterizations showing: Right atrial pressure 7, RV 23/7, PA 30/17, mean PCWP 18, CO by Fick 5.2, CI by Fick 2.2, CO by thermodilution 3.61 and CI by thermodilution 1.55; RCA normal, left main with mild narrowing distal portion 20%, LAD luminal irregularities, circumflex mild luminal irregularities; EF 15%-20%.  He diuresed well with discharge rate of 102 kg.    Returns for follow up today.  Has felt increased fatigue over the last 3 weeks or so.  Carvedilol was increased to 6.25 mg BID.  He has good and bad days, dyspnea ok today.  C/o chest pressure at night that wakes him up.  Weight at home has remained stable 218-222.  No orthopnea/PND/edema.  No dizziness/syncope.  Sometimes struggle with stairs.   Repeat echo today (1/15) preliminary: 35-40%. Scheduled for disability hearing on Thursday.    ROS: All systems negative except as listed in HPI, PMH and Problem List.  Past Medical History  Diagnosis Date  . Systolic heart failure Oct 2012    echo 12/08/10 EF 20%, severe left ventricular enlargement, mild right ventricular enlargement  . Nonischemic cardiomyopathy Oct 2012    cath 12/10/10 minimal nonobstructive coronary artery disease  . Hyperlipidemia   . Benign prostatic hypertrophy     Current Outpatient Prescriptions  Medication Sig Dispense Refill  . aspirin 81 MG tablet Take 81 mg by mouth daily.        . carvedilol (COREG) 12.5 MG tablet Take 0.5 tablets (6.25 mg total) by mouth 2 (two) times daily with a meal.  180 tablet  2  . digoxin (LANOXIN) 0.25 MG tablet Take 1 tablet (250 mcg total) by mouth daily.  90 tablet  3  . furosemide (LASIX) 40 MG tablet Take 1 tablet (40 mg total) by mouth daily.  90 tablet  2  . lisinopril (PRINIVIL,ZESTRIL) 10 MG tablet Take 0.5  tablets (5 mg total) by mouth 2 (two) times daily.  90 tablet  2  . pravastatin (PRAVACHOL) 40 MG tablet Take 1 tablet (40 mg total) by mouth daily.  90 tablet  2  . spironolactone (ALDACTONE) 25 MG tablet Take 1 tablet (25 mg total) by mouth daily.  90 tablet  2     PHYSICAL EXAM: Filed Vitals:   03/01/11 1415  BP: 124/68  Pulse: 66  Weight: 227 lb 8 oz (103.193 kg)  SpO2: 98%  Weight up from 221 1 month ago  General:  Well appearing. No resp difficulty HEENT: normal Neck: supple. JVP flat. Carotids 1+ bilaterally; no bruits. No lymphadenopathy or thryomegaly appreciated. Cor: PMI normal. Regular rate & rhythm. No rubs, gallops or murmurs. No S3 Lungs: clear Abdomen: soft, nontender, nondistended. No hepatosplenomegaly. No bruits or masses. Good bowel sounds. Extremities: no cyanosis, clubbing, rash, edema Neuro: alert & orientedx3, cranial nerves grossly intact. Moves all 4 extremities w/o difficulty. Affect pleasant.   ASSESSMENT & PLAN:

## 2011-03-01 NOTE — Patient Instructions (Addendum)
Take an extra Furosemide (lasix) if your weight is 222 lb or higher  Your physician recommends that you schedule a follow-up appointment in: 1 month

## 2011-03-31 ENCOUNTER — Ambulatory Visit (HOSPITAL_COMMUNITY)
Admission: RE | Admit: 2011-03-31 | Discharge: 2011-03-31 | Disposition: A | Discharge status: Home / Self Care | Payer: PRIVATE HEALTH INSURANCE | Source: Ambulatory Visit | Attending: Internal Medicine | Admitting: Internal Medicine

## 2011-03-31 ENCOUNTER — Encounter (HOSPITAL_COMMUNITY): Payer: Self-pay

## 2011-03-31 VITALS — BP 130/78 | HR 73 | Wt 234.0 lb

## 2011-03-31 DIAGNOSIS — I5022 Chronic systolic (congestive) heart failure: Secondary | ICD-10-CM

## 2011-03-31 MED ORDER — SPIRONOLACTONE 25 MG PO TABS: 25.0000 mg | ORAL_TABLET | Freq: Every day | ORAL | Status: DC

## 2011-03-31 MED ORDER — CARVEDILOL 12.5 MG PO TABS: 12.5000 mg | ORAL_TABLET | Freq: Two times a day (BID) | ORAL | Status: DC

## 2011-03-31 NOTE — Patient Instructions (Signed)
Take Spironolactone 25 mg daily  Take carvedilol 12.5 mg twice a day  Follow up in 1 month  Do the following things EVERYDAY: 1) Weigh yourself in the morning before breakfast. Write it down and keep it in a log. 2) Take your medicines as prescribed 3) Eat low salt foods--Limit salt (sodium) to 2000mg  per day.  4) Stay as active as you can everyday

## 2011-03-31 NOTE — Progress Notes (Signed)
Patient ID: Austin [name], male   DOB: [birth year], 56 y.o.   MRN: [id number] HPI:  Austin [name] is a 56 yo gentlemen with history of BPH and hyperlipidemia and recently diagnosed systolic HF, EF 20% in October 2012.  Dr. Shirlee Latch took the pt to the cath lab on 12/10/2010 for right and left heart catheterizations showing: Right atrial pressure 7, RV 23/7, PA 30/17, mean PCWP 18, CO by Fick 5.2, CI by Fick 2.2, CO by thermodilution 3.61 and CI by thermodilution 1.55; RCA normal, left main with mild narrowing distal portion 20%, LAD luminal irregularities, circumflex mild luminal irregularities; EF 15%-20%.  He diuresed well with discharge rate of 102 kg.    Repeat echo today (1/15) preliminary: 35-40%. Scheduled for disability hearing on Thursday.    He is here for follow up. Complains of intermittent chest pain. 1-2 times a week.  He has missed a couple of days of his medications. He has signed up for [facility] benefits. [facility] Discharge).  Disability pending. He has difficulty paying for food. Noncompliant with low salt diet. Works part time at [employer]. Weight at home 227-231 pounds. Denies lower extremity edema.  Denies SOB/Orthopnea.  +PND.   ROS: All systems negative except as listed in HPI, PMH and Problem List.  Past Medical History  Diagnosis Date  . Systolic heart failure Oct 2012    echo 12/08/10 EF 20%, severe left ventricular enlargement, mild right ventricular enlargement  . Nonischemic cardiomyopathy Oct 2012    cath 12/10/10 minimal nonobstructive coronary artery disease  . Hyperlipidemia   . Benign prostatic hypertrophy     Current Outpatient Prescriptions  Medication Sig Dispense Refill  . aspirin 81 MG tablet Take 81 mg by mouth daily.        . carvedilol (COREG) 12.5 MG tablet Take 0.5 tablets (6.25 mg total) by mouth 2 (two) times daily with a meal.  180 tablet  2  . digoxin (LANOXIN) 0.25 MG tablet Take 1 tablet (250 mcg total) by mouth daily.  90 tablet  3  . furosemide  (LASIX) 40 MG tablet Take 1 tablet (40 mg total) by mouth daily.  90 tablet  2  . lisinopril (PRINIVIL,ZESTRIL) 10 MG tablet Take 0.5 tablets (5 mg total) by mouth 2 (two) times daily.  90 tablet  2  . pravastatin (PRAVACHOL) 40 MG tablet Take 1 tablet (40 mg total) by mouth daily.  90 tablet  2  . spironolactone (ALDACTONE) 25 MG tablet Take 12.5 mg by mouth daily.         PHYSICAL EXAM: Filed Vitals:   03/31/11 1335  BP: 130/78  Pulse: 73  Weight: 234 lb (106.142 kg)  SpO2: 99%  Weight up from 234  (227)  General:  Well appearing. No resp difficulty HEENT: normal Neck: supple. JVP flat. Carotids 1+ bilaterally; no bruits. No lymphadenopathy or thryomegaly appreciated. Cor: PMI normal. Regular rate & rhythm. No rubs, gallops or murmurs. No S3 Lungs: clear Abdomen: soft, nontender, nondistended. No hepatosplenomegaly. No bruits or masses. Good bowel sounds. Extremities: no cyanosis, clubbing, rash, edema Neuro: alert & orientedx3, cranial nerves grossly intact. Moves all 4 extremities w/o difficulty. Affect pleasant.   ASSESSMENT & PLAN:

## 2011-03-31 NOTE — Assessment & Plan Note (Addendum)
NYHA II-III. Volume status stable. ECHO results discussed 40-45%. Will increase Carvedilol 9.375 mg BID. Increase  Spironolactone 25 mg daily. Follow up in 3 weeks.   Patient seen and examined with Tonye Becket, NP. We discussed all aspects of the encounter. I agree with the assessment and plan as stated above.  EF and symptoms improving. Continue to titrate meds as tolerated.

## 2011-04-05 ENCOUNTER — Telehealth: Payer: Self-pay | Admitting: Cardiology

## 2011-04-05 NOTE — Telephone Encounter (Signed)
Error, sent pt to [facility], had question re financial services

## 2011-04-07 ENCOUNTER — Telehealth (HOSPITAL_COMMUNITY): Payer: Self-pay | Admitting: *Deleted

## 2011-04-07 NOTE — Telephone Encounter (Signed)
Got pt a 30 day supply of meds from pharmacy, he is aware and will pick them up tomorrow AM, he is aware that Amy Riley Kill will call him to f/u

## 2011-04-07 NOTE — Telephone Encounter (Signed)
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  04/07/11.cam

## 2011-04-07 NOTE — Telephone Encounter (Addendum)
Pt's insurance has been cancelled and he has been out of meds for 5 days and has no money to get them, he is very nervous and upset about this, he states he has dropped off paperwork at the [place] showing he was a [place] Discharge and they said it would be 4-6 weeks before they could get him an appt, will talk to pharmacy and get his meds have spoken w/Amy [place] Child psychotherapist and she will f/u with pt

## 2011-04-27 ENCOUNTER — Ambulatory Visit (HOSPITAL_COMMUNITY)
Admission: RE | Admit: 2011-04-27 | Discharge: 2011-04-27 | Disposition: A | Discharge status: Home / Self Care | Payer: PRIVATE HEALTH INSURANCE | Source: Ambulatory Visit | Attending: Internal Medicine | Admitting: Internal Medicine

## 2011-04-27 VITALS — BP 142/88 | HR 84 | Wt 235.8 lb

## 2011-04-27 DIAGNOSIS — Z91199 Patient's noncompliance with other medical treatment and regimen due to unspecified reason: Secondary | ICD-10-CM | POA: Insufficient documentation

## 2011-04-27 DIAGNOSIS — I5022 Chronic systolic (congestive) heart failure: Secondary | ICD-10-CM | POA: Insufficient documentation

## 2011-04-27 DIAGNOSIS — E785 Hyperlipidemia, unspecified: Secondary | ICD-10-CM | POA: Insufficient documentation

## 2011-04-27 DIAGNOSIS — I509 Heart failure, unspecified: Secondary | ICD-10-CM | POA: Insufficient documentation

## 2011-04-27 DIAGNOSIS — I428 Other cardiomyopathies: Secondary | ICD-10-CM | POA: Insufficient documentation

## 2011-04-27 DIAGNOSIS — N400 Enlarged prostate without lower urinary tract symptoms: Secondary | ICD-10-CM | POA: Insufficient documentation

## 2011-04-27 DIAGNOSIS — Z9119 Patient's noncompliance with other medical treatment and regimen: Secondary | ICD-10-CM | POA: Insufficient documentation

## 2011-04-27 DIAGNOSIS — Z7982 Long term (current) use of aspirin: Secondary | ICD-10-CM | POA: Insufficient documentation

## 2011-04-27 MED ORDER — CARVEDILOL 12.5 MG PO TABS: 18.7500 mg | ORAL_TABLET | Freq: Two times a day (BID) | ORAL | Status: DC

## 2011-04-27 NOTE — Patient Instructions (Signed)
Increase carvedilol to 1.5 tab (18.75 mg ) twice daily.  Follow up 4 weeks.   Do the following things EVERYDAY: 1) Weigh yourself in the morning before breakfast. Write it down and keep it in a log. 2) Take your medicines as prescribed 3) Eat low salt foods--Limit salt (sodium) to 2000mg  per day.  4) Stay as active as you can everyday

## 2011-04-27 NOTE — Progress Notes (Signed)
HPI:  [name] is a 57 yo gentlemen with history of BPH and hyperlipidemia and recently diagnosed systolic HF, EF 20% in October 2012.  Dr. Shirlee Latch took the pt to the cath lab on 12/10/2010 for right and left heart catheterizations showing: Right atrial pressure 7, RV 23/7, PA 30/17, mean PCWP 18, CO by Fick 5.2, CI by Fick 2.2, CO by thermodilution 3.61 and CI by thermodilution 1.55; RCA normal, left main with mild narrowing distal portion 20%, LAD luminal irregularities, circumflex mild luminal irregularities; EF 15%-20%.  He diuresed well with discharge rate of 102 kg.    Repeat echo (1/15) preliminary: 35-40%.      He is here today for follow up.  Complains of intermittent chest pain. 1-2 times a week.  Compliant with his medications.  He [facility] at the [facility] on [facility] 24th.  [facility] Discharge).  Disability pending. He has difficulty paying for food. Noncompliant with low salt diet. Works part time at [employer]. Weight at home around 230 pounds. Denies lower extremity edema. Denies SOB/Orthopnea.  +PND.   ROS: All systems negative except as listed in HPI, PMH and Problem List.  Past Medical History  Diagnosis Date  . Systolic heart failure Oct 2012    echo 12/08/10 EF 20%, severe left ventricular enlargement, mild right ventricular enlargement  . Nonischemic cardiomyopathy Oct 2012    cath 12/10/10 minimal nonobstructive coronary artery disease  . Hyperlipidemia   . Benign prostatic hypertrophy     Current Outpatient Prescriptions  Medication Sig Dispense Refill  . aspirin 81 MG tablet Take 81 mg by mouth daily.        . carvedilol (COREG) 12.5 MG tablet Take 1 tablet (12.5 mg total) by mouth 2 (two) times daily with a meal.  60 tablet  6  . digoxin (LANOXIN) 0.25 MG tablet Take 1 tablet (250 mcg total) by mouth daily.  90 tablet  3  . furosemide (LASIX) 40 MG tablet Take 1 tablet (40 mg total) by mouth daily.  90 tablet  2  . lisinopril (PRINIVIL,ZESTRIL) 10 MG tablet Take 0.5  tablets (5 mg total) by mouth 2 (two) times daily.  90 tablet  2  . pravastatin (PRAVACHOL) 40 MG tablet Take 1 tablet (40 mg total) by mouth daily.  90 tablet  2  . spironolactone (ALDACTONE) 25 MG tablet Take 1 tablet (25 mg total) by mouth daily.  30 tablet  6     PHYSICAL EXAM: Filed Vitals:   04/27/11 0932  BP: 142/88  Pulse: 84  Weight: 235 lb 12 oz (106.935 kg)  SpO2: 97%    General:  Well appearing. No resp difficulty HEENT: normal Neck: supple. JVP flat. Carotids 1+ bilaterally; no bruits. No lymphadenopathy or thryomegaly appreciated. Cor: PMI normal. Regular rate & rhythm. No rubs, gallops or murmurs. No S3 Lungs: clear Abdomen: soft, nontender, nondistended. No hepatosplenomegaly. No bruits or masses. Good bowel sounds. Extremities: no cyanosis, clubbing, rash, edema Neuro: alert & orientedx3, cranial nerves grossly intact. Moves all 4 extremities w/o difficulty. Affect pleasant.   ASSESSMENT & PLAN:

## 2011-04-27 NOTE — Assessment & Plan Note (Signed)
Volume status looks good today.  Will continue to titrate coreg to 18.75 mg twice daily.  With potassium being around 5 will not titrate lisinopril at this time.  Encouraged him to continue daily weights and discussed use of sliding scale lasix.

## 2011-04-29 ENCOUNTER — Encounter (HOSPITAL_COMMUNITY): Payer: Managed Care, Other (non HMO)

## 2011-06-03 ENCOUNTER — Ambulatory Visit (HOSPITAL_COMMUNITY)
Admission: RE | Admit: 2011-06-03 | Discharge: 2011-06-03 | Disposition: A | Discharge status: Home / Self Care | Payer: Self-pay | Source: Ambulatory Visit | Attending: Internal Medicine | Admitting: Internal Medicine

## 2011-06-03 VITALS — BP 118/74 | HR 83 | Wt 233.0 lb

## 2011-06-03 DIAGNOSIS — I5022 Chronic systolic (congestive) heart failure: Secondary | ICD-10-CM

## 2011-06-03 MED ORDER — CARVEDILOL 12.5 MG PO TABS: 25.0000 mg | ORAL_TABLET | Freq: Two times a day (BID) | ORAL | Status: DC

## 2011-06-03 NOTE — Patient Instructions (Signed)
Take Carvedilol 25 mg twice a day  Do the following things EVERYDAY: 1) Weigh yourself in the morning before breakfast. Write it down and keep it in a log. 2) Take your medicines as prescribed 3) Eat low salt foods--Limit salt (sodium) to 2000mg  per day.  4) Stay as active as you can everyday  Follow up in 6 weeks

## 2011-06-03 NOTE — Assessment & Plan Note (Signed)
NYHA I. Volume status stable. Reviewed most recent BMET. Potassium 5.0 therefore will not increase ace inhibitor.Will increase Carvedilol to 25 mg twice. Discussed possible fatigue and edema associated with increased carvedilol. Instructed to take an extra Lasix if weight increases 3 pounds in 24 hours. Follow up in 6 weeks.

## 2011-06-03 NOTE — Progress Notes (Signed)
Patient ID: Austin [name], male   DOB: 01-Jun-1955, 56 y.o.   MRN: [id number] HPI:  [name] is a 56 yo gentlemen with history of BPH and hyperlipidemia and recently diagnosed systolic HF, EF 20% in October 2012.  Dr. Shirlee Latch took the pt to the cath lab on 12/10/2010 for right and left heart catheterizations showing: Right atrial pressure 7, RV 23/7, PA 30/17, mean PCWP 18, CO by Fick 5.2, CI by Fick 2.2, CO by thermodilution 3.61 and CI by thermodilution 1.55; RCA normal, left main with mild narrowing distal portion 20%, LAD luminal irregularities, circumflex mild luminal irregularities; EF 15%-20%.  He diuresed well with discharge rate of 102 kg.    Repeat echo (1/15) preliminary: 35-40%.      He is here today for follow up.  Last visit Carvedilol increased to 18.75 mg  BID and he tolerated increase. Denies SOB/PND/Orthopnea/CP. Weight at home 229-230. He has not required any extra Lasix. [facility] Compliant with all medications. Disability approved and he is able to pay for his medications. Tries to follow low salt diet.    ROS: All systems negative except as listed in HPI, PMH and Problem List.  Past Medical History  Diagnosis Date  . Systolic heart failure Oct 2012    echo 12/08/10 EF 20%, severe left ventricular enlargement, mild right ventricular enlargement  . Nonischemic cardiomyopathy Oct 2012    cath 12/10/10 minimal nonobstructive coronary artery disease  . Hyperlipidemia   . Benign prostatic hypertrophy     Current Outpatient Prescriptions  Medication Sig Dispense Refill  . aspirin 81 MG tablet Take 81 mg by mouth daily.        . carvedilol (COREG) 12.5 MG tablet Take 1.5 tablets (18.75 mg total) by mouth 2 (two) times daily with a meal.  60 tablet  6  . digoxin (LANOXIN) 0.25 MG tablet Take 1 tablet (250 mcg total) by mouth daily.  90 tablet  3  . furosemide (LASIX) 40 MG tablet Take 1 tablet (40 mg total) by mouth daily.  90 tablet  2  .  lisinopril (PRINIVIL,ZESTRIL) 10 MG tablet Take 0.5 tablets (5 mg total) by mouth 2 (two) times daily.  90 tablet  2  . pravastatin (PRAVACHOL) 40 MG tablet Take 1 tablet (40 mg total) by mouth daily.  90 tablet  2  . spironolactone (ALDACTONE) 25 MG tablet Take 1 tablet (25 mg total) by mouth daily.  30 tablet  6     PHYSICAL EXAM: Filed Vitals:   06/03/11 0914  BP: 118/74  Pulse: 83  Weight: 233 lb (105.688 kg)  SpO2: 100%    General:  Well appearing. No resp difficulty HEENT: normal Neck: supple. JVP flat. Carotids 1+ bilaterally; no bruits. No lymphadenopathy or thryomegaly appreciated. Cor: PMI normal. Regular rate & rhythm. No rubs, gallops or murmurs. No S3 Lungs: clear Abdomen: soft, nontender, nondistended. No hepatosplenomegaly. No bruits or masses. Good bowel sounds. Extremities: no cyanosis, clubbing, rash, edema Neuro: alert & orientedx3, cranial nerves grossly intact. Moves all 4 extremities w/o difficulty. Affect pleasant.   ASSESSMENT & PLAN:

## 2011-07-14 ENCOUNTER — Ambulatory Visit (HOSPITAL_COMMUNITY)
Admission: RE | Admit: 2011-07-14 | Discharge: 2011-07-14 | Disposition: A | Discharge status: Home / Self Care | Payer: Self-pay | Source: Ambulatory Visit | Attending: Internal Medicine | Admitting: Internal Medicine

## 2011-07-14 VITALS — BP 120/70 | HR 72 | Ht 76.0 in | Wt 241.0 lb

## 2011-07-14 DIAGNOSIS — I5022 Chronic systolic (congestive) heart failure: Secondary | ICD-10-CM | POA: Insufficient documentation

## 2011-07-14 DIAGNOSIS — E785 Hyperlipidemia, unspecified: Secondary | ICD-10-CM | POA: Insufficient documentation

## 2011-07-14 DIAGNOSIS — N400 Enlarged prostate without lower urinary tract symptoms: Secondary | ICD-10-CM | POA: Insufficient documentation

## 2011-07-14 DIAGNOSIS — I428 Other cardiomyopathies: Secondary | ICD-10-CM | POA: Insufficient documentation

## 2011-07-14 DIAGNOSIS — I509 Heart failure, unspecified: Secondary | ICD-10-CM | POA: Insufficient documentation

## 2011-07-14 DIAGNOSIS — Z7982 Long term (current) use of aspirin: Secondary | ICD-10-CM | POA: Insufficient documentation

## 2011-07-14 NOTE — Assessment & Plan Note (Signed)
He returns for follow up and he continues to do well. NYHA I. Volume status stable despite weight gain. Continue current medications. Reinforced low diet choices. Repeat ECHO. Follow up in one month.

## 2011-07-14 NOTE — Patient Instructions (Signed)
Follow up in one month  Please schedule ECHO    Do the following things EVERYDAY: 1) Weigh yourself in the morning before breakfast. Write it down and keep it in a log. 2) Take your medicines as prescribed 3) Eat low salt foods--Limit salt (sodium) to 2000 mg per day.  4) Stay as active as you can everyday 5) Limit all fluids for the day to less than 2 liters

## 2011-07-14 NOTE — Progress Notes (Signed)
Patient ID: ORVAN PAPADAKIS, male   DOB: [birth year], 56 y.o.   MRN: [id number] HPI: Mr Klecka is a 56 yo gentlemen with history of BPH and hyperlipidemia and recently diagnosed systolic HF, EF 20% in October 2012.  Dr. Shirlee Latch took the pt to the cath lab on 12/10/2010 for right and left heart catheterizations showing: Right atrial pressure 7, RV 23/7, PA 30/17, mean PCWP 18, CO by Fick 5.2, CI by Fick 2.2, CO by thermodilution 3.61 and CI by thermodilution 1.55; RCA normal, left main with mild narrowing distal portion 20%, LAD luminal irregularities, circumflex mild luminal irregularities; EF 15%-20%.  He diuresed well with discharge rate of 102 kg.    Repeat echo (1/15) EF 30-35%.      He is here today for follow up.  Last visit Carvedilol increased to 25 mg  BID and he tolerated increase. Feels great!. Denies SOB/PND/Orthopnea/CP. Weight at home 238-241 pounds. Compliant with medications. Works part time to Air traffic controller. Liberalized diet and he is eating pizza 5 days a week.  Followed by [place] in [place]. He obtains medications at [place].    ROS: All systems negative except as listed in HPI, PMH and Problem List.  Past Medical History  Diagnosis Date  . Systolic heart failure Oct 2012    echo 12/08/10 EF 20%, severe left ventricular enlargement, mild right ventricular enlargement  . Nonischemic cardiomyopathy Oct 2012    cath 12/10/10 minimal nonobstructive coronary artery disease  . Hyperlipidemia   . Benign prostatic hypertrophy     Current Outpatient Prescriptions  Medication Sig Dispense Refill  . aspirin 81 MG tablet Take 81 mg by mouth daily.        . carvedilol (COREG) 12.5 MG tablet Take 2 tablets (25 mg total) by mouth 2 (two) times daily with a meal.  120 tablet  6  . digoxin (LANOXIN) 0.25 MG tablet Take 1 tablet (250 mcg total) by mouth daily.  90 tablet  3  . furosemide (LASIX) 40 MG tablet Take 1 tablet (40 mg total) by mouth daily.  90 tablet  2  . lisinopril (PRINIVIL,ZESTRIL)  10 MG tablet Take 0.5 tablets (5 mg total) by mouth 2 (two) times daily.  90 tablet  2  . pravastatin (PRAVACHOL) 40 MG tablet Take 1 tablet (40 mg total) by mouth daily.  90 tablet  2  . spironolactone (ALDACTONE) 25 MG tablet Take 1 tablet (25 mg total) by mouth daily.  30 tablet  6     PHYSICAL EXAM: Filed Vitals:   07/14/11 1335  BP: 120/70  Pulse: 72  Height: 6\' 4"  (1.93 m)  Weight: 241 lb (109.317 kg)  SpO2: 97%  241 (233)  General:  Well appearing. No resp difficulty HEENT: normal Neck: supple. JVP flat. Carotids 1+ bilaterally; no bruits. No lymphadenopathy or thryomegaly appreciated. Cor: PMI normal. Regular rate & rhythm. No rubs, gallops or murmurs. No S3 Lungs: clear Abdomen: soft, nontender, nondistended. No hepatosplenomegaly. No bruits or masses. Good bowel sounds. Extremities: no cyanosis, clubbing, rash, edema RUE incision intact Neuro: alert & orientedx3, cranial nerves grossly intact. Moves all 4 extremities w/o difficulty. Affect pleasant.   ASSESSMENT & PLAN:

## 2011-08-08 ENCOUNTER — Ambulatory Visit (HOSPITAL_COMMUNITY)
Admission: RE | Admit: 2011-08-08 | Discharge: 2011-08-08 | Disposition: A | Discharge status: Home / Self Care | Payer: Self-pay | Source: Ambulatory Visit | Attending: Internal Medicine | Admitting: Internal Medicine

## 2011-08-08 ENCOUNTER — Ambulatory Visit (HOSPITAL_COMMUNITY)
Admission: RE | Admit: 2011-08-08 | Discharge: 2011-08-08 | Disposition: A | Discharge status: Home / Self Care | Payer: Self-pay | Source: Ambulatory Visit | Attending: Adult Health | Admitting: Adult Health

## 2011-08-08 ENCOUNTER — Encounter (HOSPITAL_COMMUNITY): Payer: Self-pay

## 2011-08-08 VITALS — BP 112/60 | HR 62 | Resp 18 | Ht 76.0 in | Wt 241.0 lb

## 2011-08-08 DIAGNOSIS — I517 Cardiomegaly: Secondary | ICD-10-CM

## 2011-08-08 DIAGNOSIS — I5020 Unspecified systolic (congestive) heart failure: Secondary | ICD-10-CM | POA: Insufficient documentation

## 2011-08-08 DIAGNOSIS — Z87891 Personal history of nicotine dependence: Secondary | ICD-10-CM | POA: Insufficient documentation

## 2011-08-08 DIAGNOSIS — I428 Other cardiomyopathies: Secondary | ICD-10-CM | POA: Insufficient documentation

## 2011-08-08 DIAGNOSIS — I509 Heart failure, unspecified: Secondary | ICD-10-CM | POA: Insufficient documentation

## 2011-08-08 DIAGNOSIS — E785 Hyperlipidemia, unspecified: Secondary | ICD-10-CM | POA: Insufficient documentation

## 2011-08-08 DIAGNOSIS — I5022 Chronic systolic (congestive) heart failure: Secondary | ICD-10-CM

## 2011-08-08 NOTE — Progress Notes (Signed)
*  PRELIMINARY RESULTS* Echocardiogram 2D Echocardiogram has been performed.  Austin [name] 08/08/2011, 2:38 PM

## 2011-08-08 NOTE — Patient Instructions (Addendum)
Stop digoxin  Follow up in 3 months  Do the following things EVERYDAY: 1) Weigh yourself in the morning before breakfast. Write it down and keep it in a log. 2) Take your medicines as prescribed 3) Eat low salt foods--Limit salt (sodium) to 2000 mg per day.  4) Stay as active as you can everyday 5) Limit all fluids for the day to less than 2 liters

## 2011-08-08 NOTE — Assessment & Plan Note (Addendum)
Reviewed ECHO results . EF recovered 50-55%. Stop digoxin. Follow up in 3 months.   Patient seen and examined with Tonye Becket, NP. We discussed all aspects of the encounter. I agree with the assessment and plan as stated above.  Echo reviewed personally. EF back in normal range. Doing well clinically. Reinforced need for daily weights and reviewed use of sliding scale diuretics. Can stop digoxin. Continue other meds.

## 2011-08-08 NOTE — Progress Notes (Signed)
Patient ID: Austin [name], male   DOB: 1955/02/19, 56 y.o.   MRN: [id number] HPI: [name] is a 56 yo gentlemen with history of BPH and hyperlipidemia and recently diagnosed systolic HF, EF 20% in October 2012.  Dr. Shirlee Latch took the pt to the cath lab on 12/10/2010 for right and left heart catheterizations showing: Right atrial pressure 7, RV 23/7, PA 30/17, mean PCWP 18, CO by Fick 5.2, CI by Fick 2.2, CO by thermodilution 3.61 and CI by thermodilution 1.55; RCA normal, left main with mild narrowing distal portion 20%, LAD luminal irregularities, circumflex mild luminal irregularities; EF 15%-20%.  He diuresed well with discharge rate of 102 kg.    Repeat echo (1/15) EF 30-35%.     08/08/11 ECHO EF 50-55%  He is here today for follow up.  Feels better. More energy. Denies SOB/PND/Orthopnea/CP. Obtains medications at [facility]. Compliant with medications. Weight at home 240-242. Working for a Loss adjuster, chartered. Not following low salt diet. Eating ham, hot dogs, pizza, bacon.    ROS: All systems negative except as listed in HPI, PMH and Problem List.  Past Medical History  Diagnosis Date  . Systolic heart failure Oct 2012    echo 12/08/10 EF 20%, severe left ventricular enlargement, mild right ventricular enlargement  . Nonischemic cardiomyopathy Oct 2012    cath 12/10/10 minimal nonobstructive coronary artery disease  . Hyperlipidemia   . Benign prostatic hypertrophy     Current Outpatient Prescriptions  Medication Sig Dispense Refill  . aspirin 81 MG tablet Take 81 mg by mouth daily.        . carvedilol (COREG) 12.5 MG tablet Take 2 tablets (25 mg total) by mouth 2 (two) times daily with a meal.  120 tablet  6  . digoxin (LANOXIN) 0.25 MG tablet Take 1 tablet (250 mcg total) by mouth daily.  90 tablet  3  . furosemide (LASIX) 40 MG tablet Take 1 tablet (40 mg total) by mouth daily.  90 tablet  2  . lisinopril (PRINIVIL,ZESTRIL) 10 MG tablet Take 0.5 tablets (5 mg total) by mouth 2 (two) times daily.   90 tablet  2  . pravastatin (PRAVACHOL) 40 MG tablet Take 1 tablet (40 mg total) by mouth daily.  90 tablet  2  . spironolactone (ALDACTONE) 25 MG tablet Take 1 tablet (25 mg total) by mouth daily.  30 tablet  6     PHYSICAL EXAM: Filed Vitals:   08/08/11 1454  BP: 112/60  Pulse: 62  Resp: 18  Height: 6\' 4"  (1.93 m)  Weight: 241 lb (109.317 kg)  241 (233)  General:  Well appearing. No resp difficulty HEENT: normal Neck: supple. JVP flat. Carotids 1+ bilaterally; no bruits. No lymphadenopathy or thryomegaly appreciated. Cor: PMI normal. Regular rate & rhythm. No rubs, gallops or murmurs. No S3 Lungs: clear Abdomen: soft, nontender, nondistended. No hepatosplenomegaly. No bruits or masses. Good bowel sounds. Extremities: no cyanosis, clubbing, rash, edema RUE incision intact Neuro: alert & orientedx3, cranial nerves grossly intact. Moves all 4 extremities w/o difficulty. Affect pleasant.   ASSESSMENT & PLAN:

## 2011-11-21 ENCOUNTER — Ambulatory Visit (HOSPITAL_COMMUNITY)
Admission: RE | Admit: 2011-11-21 | Discharge: 2011-11-21 | Disposition: A | Discharge status: Home / Self Care | Payer: Self-pay | Source: Ambulatory Visit | Attending: Internal Medicine | Admitting: Internal Medicine

## 2011-11-21 VITALS — BP 106/72 | HR 88 | Wt 239.0 lb

## 2011-11-21 DIAGNOSIS — I5022 Chronic systolic (congestive) heart failure: Secondary | ICD-10-CM | POA: Insufficient documentation

## 2011-11-21 NOTE — Progress Notes (Signed)
HPI: [name] is a 56 yo gentlemen with history of BPH and hyperlipidemia and recently diagnosed systolic HF, EF 20% in October 2012.  Dr. Shirlee Latch took the pt to the cath lab on 12/10/2010 for right and left heart catheterizations showing: Right atrial pressure 7, RV 23/7, PA 30/17, mean PCWP 18, CO by Fick 5.2, CI by Fick 2.2, CO by thermodilution 3.61 and CI by thermodilution 1.55; RCA normal, left main with mild narrowing distal portion 20%, LAD luminal irregularities, circumflex mild luminal irregularities; EF 15%-20%.  He diuresed well with discharge rate of 102 kg.    Repeat echo (1/15) EF 30-35%.     08/08/11 ECHO EF 50-55%  He is here today for follow up.  Weight at home is a steady 239 pounds.  He is ready to go to the gym to lose weight.  He is watching his diet closely.  Continues to work for [employer].  Does note that he has more energy. Denies SOB/PND/Orthopnea/CP. Obtains medications at [facility]. Compliant with medications.    ROS: All systems negative except as listed in HPI, PMH and Problem List.  Past Medical History  Diagnosis Date  . Systolic heart failure Oct 2012    echo 12/08/10 EF 20%, severe left ventricular enlargement, mild right ventricular enlargement  . Nonischemic cardiomyopathy Oct 2012    cath 12/10/10 minimal nonobstructive coronary artery disease  . Hyperlipidemia   . Benign prostatic hypertrophy     Current Outpatient Prescriptions  Medication Sig Dispense Refill  . aspirin 81 MG tablet Take 81 mg by mouth daily.        . carvedilol (COREG) 12.5 MG tablet Take 2 tablets (25 mg total) by mouth 2 (two) times daily with a meal.  120 tablet  6  . furosemide (LASIX) 40 MG tablet Take 1 tablet (40 mg total) by mouth daily.  90 tablet  2  . lisinopril (PRINIVIL,ZESTRIL) 10 MG tablet Take 0.5 tablets (5 mg total) by mouth 2 (two) times daily.  90 tablet  2  . pravastatin (PRAVACHOL) 40 MG tablet Take 1 tablet (40 mg total) by mouth daily.  90 tablet  2  .  spironolactone (ALDACTONE) 25 MG tablet Take 1 tablet (25 mg total) by mouth daily.  30 tablet  6     PHYSICAL EXAM: Filed Vitals:   11/21/11 1344  BP: 106/72  Pulse: 88  Weight: 239 lb (108.41 kg)  SpO2: 97%   General:  Well appearing. No resp difficulty HEENT: normal Neck: supple. JVP flat. Carotids 1+ bilaterally; no bruits. No lymphadenopathy or thryomegaly appreciated. Cor: PMI normal. Regular rate & rhythm. No rubs, gallops or murmurs. No S3 Lungs: clear Abdomen: soft, nontender, nondistended. No hepatosplenomegaly. No bruits or masses. Good bowel sounds. Extremities: no cyanosis, clubbing, rash, edema  Neuro: alert & orientedx3, cranial nerves grossly intact. Moves all 4 extremities w/o difficulty. Affect pleasant.   ASSESSMENT & PLAN:

## 2011-11-21 NOTE — Patient Instructions (Addendum)
Try to use lasix only as needed for weight gain or shortness of breath.  Follow up 4 months with Dr. [name].

## 2011-11-22 NOTE — Assessment & Plan Note (Signed)
EF recovered per last echo.  Volume status looks good.  Will stop lasix and only use as needed.  Otherwise, continue current medication.  Follow up 4 months.

## 2011-12-30 ENCOUNTER — Telehealth (HOSPITAL_COMMUNITY): Payer: Self-pay | Admitting: *Deleted

## 2011-12-30 DIAGNOSIS — I5022 Chronic systolic (congestive) heart failure: Secondary | ICD-10-CM

## 2011-12-30 NOTE — Telephone Encounter (Signed)
[facility] called stating [facility] wants [facility] to have a stress test, per Dr [name] ok to order GXT, order placed and GXT scheduled for 11/22 at 11:15 pt is aware and agreeable

## 2012-01-06 ENCOUNTER — Encounter: Payer: Self-pay | Admitting: Nurse Practitioner

## 2012-01-06 ENCOUNTER — Ambulatory Visit (INDEPENDENT_AMBULATORY_CARE_PROVIDER_SITE_OTHER): Payer: Self-pay | Admitting: Nurse Practitioner

## 2012-01-06 VITALS — BP 117/89 | HR 82

## 2012-01-06 DIAGNOSIS — R079 Chest pain, unspecified: Secondary | ICD-10-CM

## 2012-01-06 DIAGNOSIS — I5022 Chronic systolic (congestive) heart failure: Secondary | ICD-10-CM

## 2012-01-06 NOTE — Procedures (Signed)
Exercise Treadmill Test  Pre-Exercise Testing Evaluation Rhythm: normal sinus  Rate: 86    PR:  .14 QRS:  .11  QT:  .36 QTc: .43     Test  Exercise Tolerance Test Ordering MD: Arvilla Meres, MD  Interpreting MD: Dennison Bulla  Unique Test No:1  Treadmill:  1  Indication for ETT: HEART FAILURE  Contraindication to ETT: No   Stress Modality: exercise - treadmill  Cardiac Imaging Performed: non   Protocol: standard Bruce - maximal  Max BP:  177/82  Max MPHR (bpm):  161 85% MPR (bpm):  137  MPHR obtained (bpm):   % MPHR obtained:  80%  Reached 85% MPHR (min:sec):  n/a Total Exercise Time (min-sec):  3:19  Workload in METS:  4.6 Borg Scale: 17  Reason ETT Terminated:  patient's desire to stop    ST Segment Analysis At Rest: Nonspecific ST/T wave changes With Exercise: borderline ST changes  Other Information Arrhythmia:  No Angina during ETT:  absent (0) Quality of ETT:  non-diagnostic  ETT Interpretation:  Non-diagnostic.  Comments: Patient presents today for routine GXT. Has known systolic heart failure. EF 20%. Followed in the CHF clinic. Has had cardiac cath with nonobstructive disease. 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 Resolves with rest. Was referred to the [place] from the CHF clinic. They requested stress testing.   Today, he exercised on the standard Bruce protocol for a total of 3:19. He has poor exercise tolerance. Target was not reached. No medicines were held for today's study. Adequate blood pressure response. Clinically he was fatigued and dyspneic. No chest pain reported. EKG with ST depression noted inferiorly and in V 5 and 6.    Recommendations: Nondiagnostic GXT for exertional chest pain.  Arrange Lexiscan  Patient is agreeable to this plan and will call if any problems develop in the interim.

## 2012-01-16 ENCOUNTER — Ambulatory Visit (HOSPITAL_COMMUNITY): Payer: Self-pay

## 2012-01-16 ENCOUNTER — Encounter (HOSPITAL_COMMUNITY): Payer: Self-pay | Admitting: *Deleted

## 2012-01-16 ENCOUNTER — Encounter: Payer: Self-pay | Admitting: *Deleted

## 2012-01-31 ENCOUNTER — Encounter (HOSPITAL_COMMUNITY): Payer: Self-pay | Admitting: Radiology

## 2012-03-28 ENCOUNTER — Encounter (HOSPITAL_COMMUNITY): Payer: Self-pay

## 2012-03-28 ENCOUNTER — Ambulatory Visit (HOSPITAL_COMMUNITY)
Admission: RE | Admit: 2012-03-28 | Discharge: 2012-03-28 | Disposition: A | Discharge status: Home / Self Care | Payer: Self-pay | Source: Ambulatory Visit | Attending: Internal Medicine | Admitting: Internal Medicine

## 2012-03-28 VITALS — BP 154/100 | HR 58 | Wt 248.5 lb

## 2012-03-28 DIAGNOSIS — I5022 Chronic systolic (congestive) heart failure: Secondary | ICD-10-CM | POA: Insufficient documentation

## 2012-03-28 DIAGNOSIS — I10 Essential (primary) hypertension: Secondary | ICD-10-CM | POA: Insufficient documentation

## 2012-03-28 NOTE — Progress Notes (Signed)
Patient ID: Austin [name], male   DOB: Sep 04, 1955, 57 y.o.   MRN: [id number]  HPI: Austin [name] is a 57 yo gentlemen with history of BPH and hyperlipidemia and recently diagnosed systolic HF, EF 20% in October 2012.  Dr. Shirlee Latch took the pt to the cath lab on 12/10/2010 for right and left heart catheterizations showing: Right atrial pressure 7, RV 23/7, PA 30/17, mean PCWP 18, CO by Fick 5.2, CI by Fick 2.2, CO by thermodilution 3.61 and CI by thermodilution 1.55; RCA normal, left main with mild narrowing distal portion 20%, LAD luminal irregularities, circumflex mild luminal irregularities; EF 15%-20%.  He diuresed well with discharge rate of 102 kg.    Repeat echo (1/15) EF 30-35%.     08/08/11 ECHO EF 50-55%  He is here today for follow up. Remains very active. Still working at [employer]. Also working on the farm. Weight at home very stable at 247-148. Has been that way for months. Denies SOB/PND/Orthopnea/CP. Obtains medications at [facility]. Compliant with medications.    ROS: All systems negative except as listed in HPI, PMH and Problem List.  Past Medical History  Diagnosis Date  . Systolic heart failure Oct 2012    echo 12/08/10 EF 20%, severe left ventricular enlargement, mild right ventricular enlargement  . Nonischemic cardiomyopathy Oct 2012    cath 12/10/10 minimal nonobstructive coronary artery disease  . Hyperlipidemia   . Benign prostatic hypertrophy     Current Outpatient Prescriptions  Medication Sig Dispense Refill  . aspirin 81 MG tablet Take 81 mg by mouth daily.        . carvedilol (COREG) 12.5 MG tablet Take 12.5 mg by mouth 2 (two) times daily with a meal.      . digoxin (LANOXIN) 0.25 MG tablet Take 0.25 mg by mouth daily.      Marland Kitchen lisinopril (PRINIVIL,ZESTRIL) 10 MG tablet Take 0.5 tablets (5 mg total) by mouth 2 (two) times daily.  90 tablet  2  . omeprazole (PRILOSEC) 20 MG capsule Take 20 mg by mouth daily.      . pravastatin (PRAVACHOL) 40 MG tablet Take 1 tablet (40 mg  total) by mouth daily.  90 tablet  2  . spironolactone (ALDACTONE) 25 MG tablet Take 1 tablet (25 mg total) by mouth daily.  30 tablet  6  . Tamsulosin HCl (FLOMAX) 0.4 MG CAPS Take 0.4 mg by mouth daily after supper.       No current facility-administered medications for this encounter.     PHYSICAL EXAM: Filed Vitals:   03/28/12 1147  BP: 162/98  Weight: 248 lb 8 oz (112.719 kg)   General:  Well appearing. No resp difficulty HEENT: normal Neck: supple. JVP flat. Carotids 1+ bilaterally; no bruits. No lymphadenopathy or thryomegaly appreciated. Cor: PMI normal. Regular rate & rhythm. No rubs, gallops or murmurs. No S3 Lungs: clear Abdomen: soft, nontender, nondistended. No hepatosplenomegaly. No bruits or masses. Good bowel sounds. Extremities: no cyanosis, clubbing, rash, edema  Neuro: alert & orientedx3, cranial nerves grossly intact. Moves all 4 extremities w/o difficulty. Affect pleasant.   ASSESSMENT & PLAN:

## 2012-03-28 NOTE — Assessment & Plan Note (Signed)
Doing great from a HF perspective. I suspect he had viral cardiomyopathy and now has recovered. Will stop digoxin. Continue other meds. Will see back in 1 month with follow-up echo to ensure stability.

## 2012-03-28 NOTE — Assessment & Plan Note (Signed)
BP up today. I gave him a calendar and he will take his BP every day and bring it back to [place]. HR too low to titrate carvedilol. Previously had hyperkalemia with ACE-I so likely cannot titrate this. May need to add amlodipine.

## 2012-03-28 NOTE — Addendum Note (Signed)
Encounter addended by: Noralee Space, RN on: 03/28/2012 12:25 PM<BR>     Documentation filed: Patient Instructions Section, Medications

## 2012-03-28 NOTE — Patient Instructions (Addendum)
Stop Digoxin  Your physician has requested that you have an echocardiogram. Echocardiography is a painless test that uses sound waves to create images of your heart. It provides your doctor with information about the size and shape of your heart and how well your heart's chambers and valves are working. This procedure takes approximately one hour. There are no restrictions for this procedure.  Your physician recommends that you schedule a follow-up appointment in: 1 month

## 2012-04-26 ENCOUNTER — Encounter (HOSPITAL_COMMUNITY): Payer: Self-pay

## 2012-04-26 ENCOUNTER — Ambulatory Visit (HOSPITAL_COMMUNITY): Payer: Self-pay

## 2012-12-30 DIAGNOSIS — M4802 Spinal stenosis, cervical region: Secondary | ICD-10-CM

## 2012-12-30 HISTORY — DX: Spinal stenosis, cervical region: M48.02

## 2013-06-25 IMAGING — CR DG CHEST 2V
2 series · 2 of 2 positions shown · non-contrast
Comparison: None available.

CLINICAL DATA: Mid chest discomfort with shortness of breath.  Ex-
smoker.

CHEST - 2 VIEW

[w chest pa]
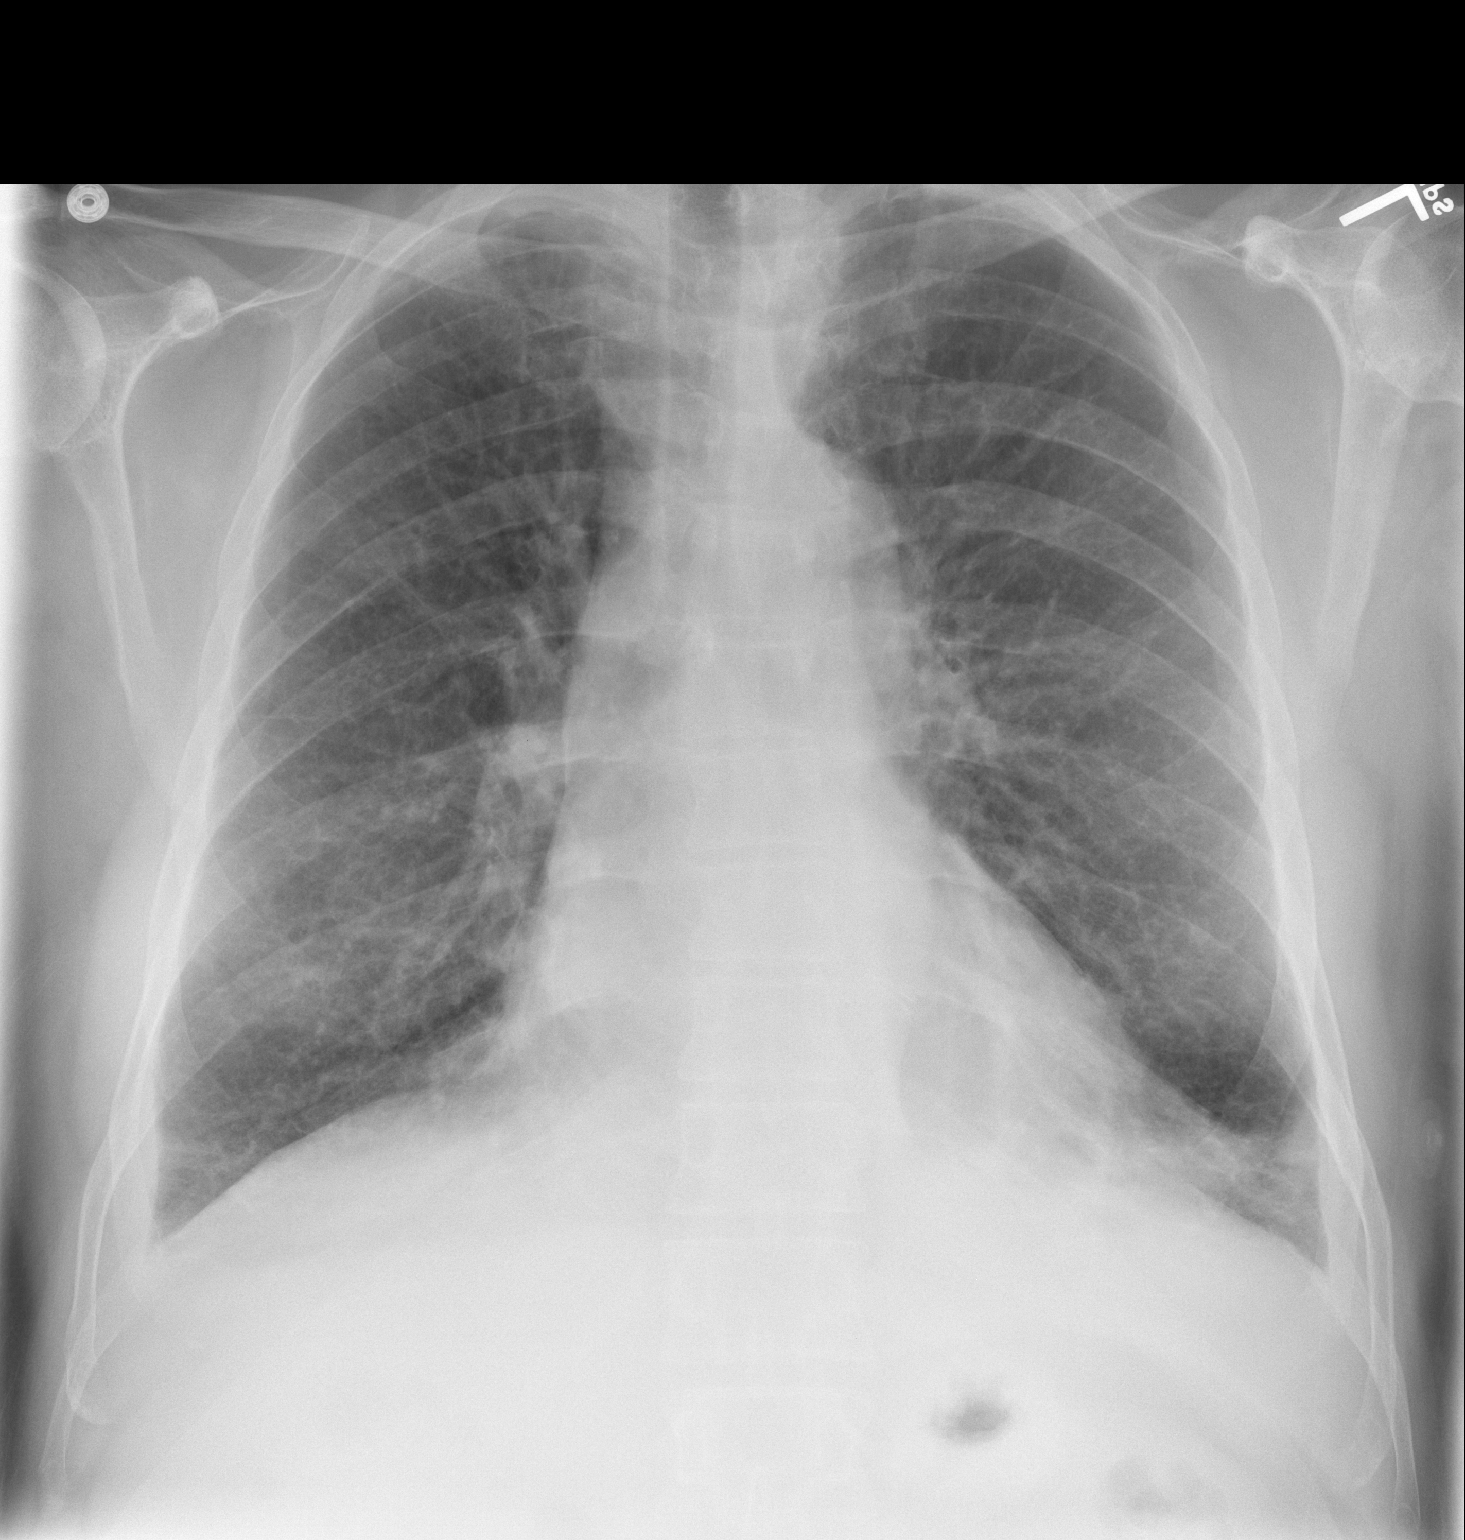

[w chest lat]
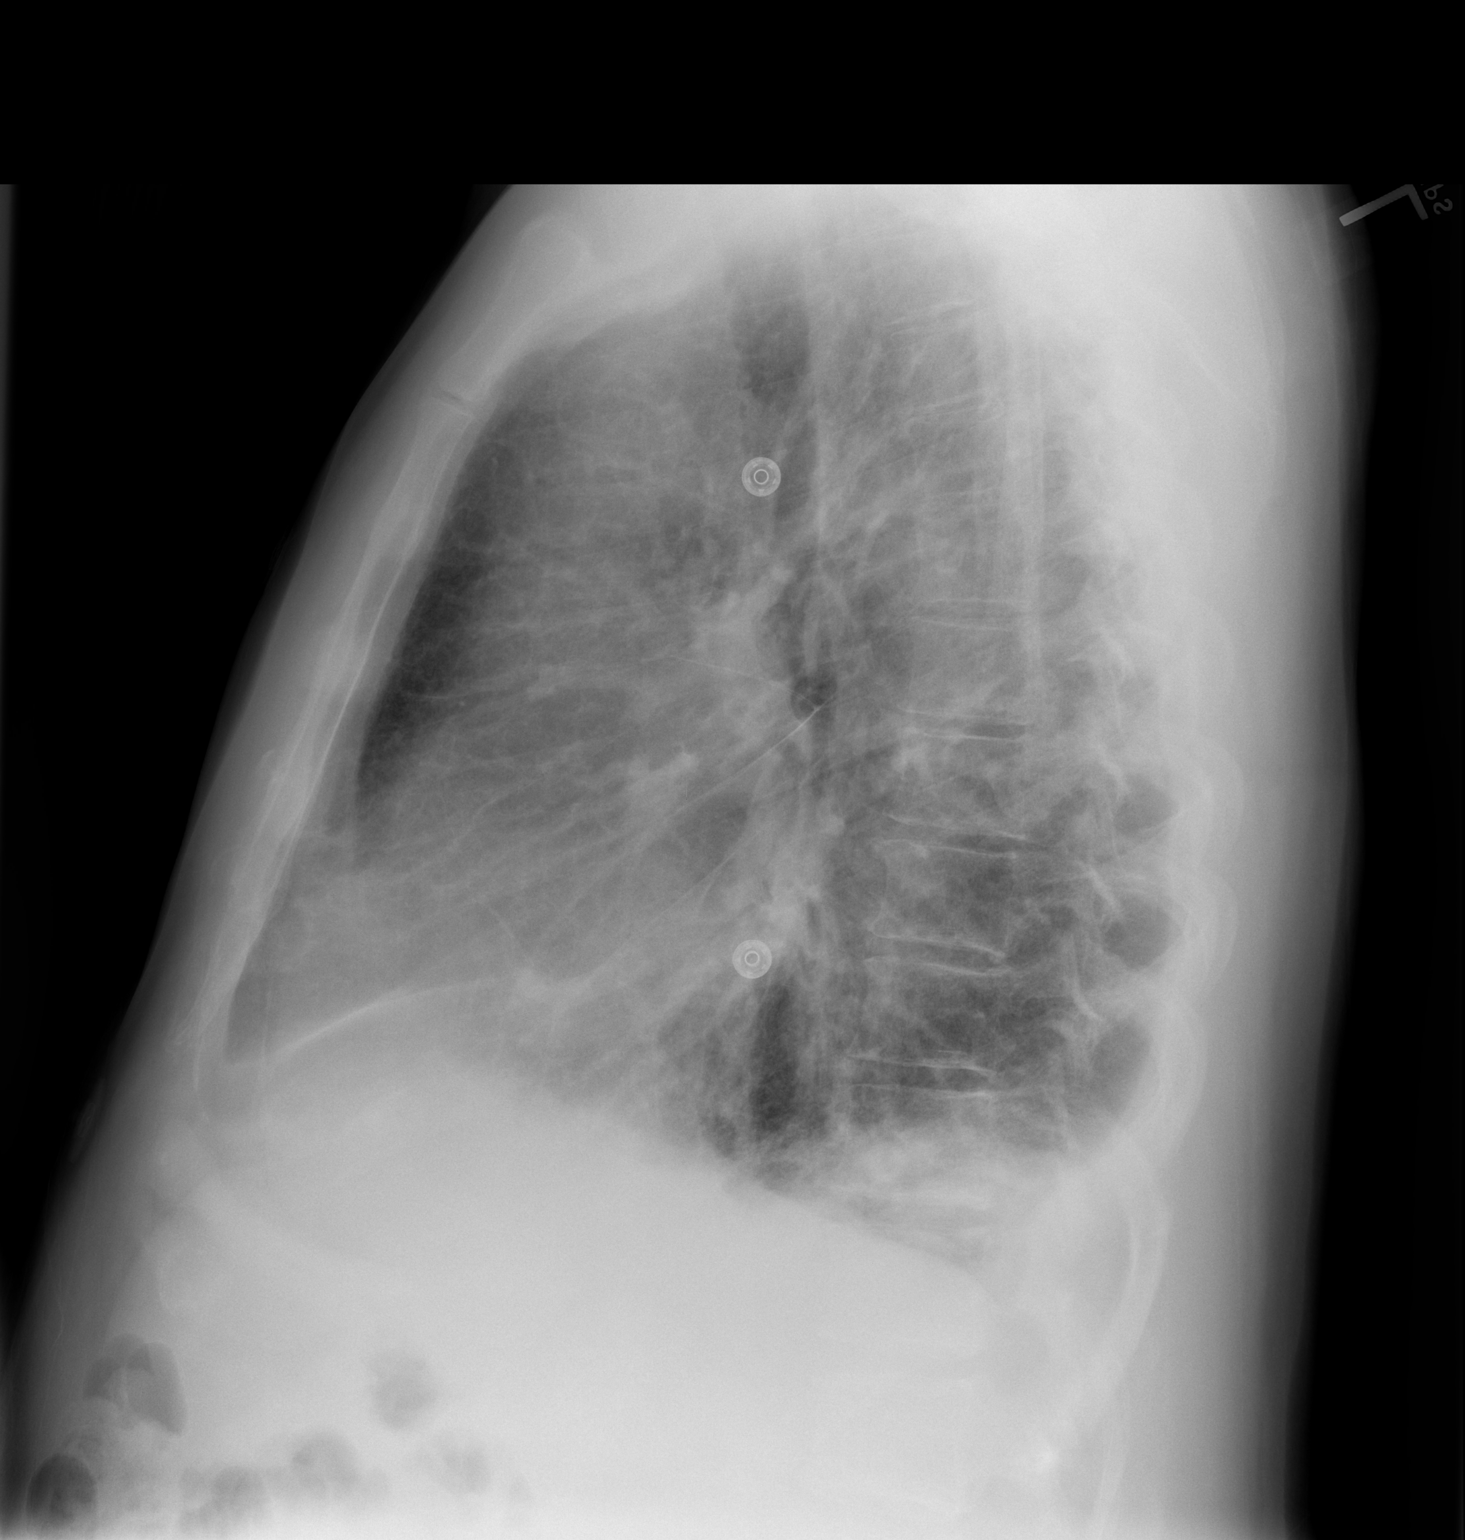

[2 of 2 positions shown; findings below may reference images not displayed]

FINDINGS: The heart size and mediastinal contours are normal.
There are small bilateral pleural effusions with associated patchy
left lower lobe air space disease.  No edema or pneumothorax is
evident.  The osseous structures appear unremarkable for age.
IMPRESSION: Left lower lobe atelectasis versus infiltrate with small bilateral
pleural effusions.  Radiographic followup recommended.

## 2013-06-25 IMAGING — CT CT ANGIO CHEST
2 of 7 series · 19 of 36 positions shown · IV contrast (APPLIED)
Comparison: Two-view chest x-ray 12/08/2010.

CLINICAL DATA: Chest pain.  Shortness of breath.

CT ANGIOGRAPHY CHEST WITH CONTRAST
TECHNIQUE: Multidetector CT imaging of the chest was performed
using the standard protocol during bolus administration of
intravenous contrast.  Multiplanar CT image reconstructions
including MIPs were obtained to evaluate the vascular anatomy.
Contrast: 80mL OMNIPAQUE IOHEXOL 300 MG/ML IV SOLN

[Series 8: pulm embolism 1.0 b25f thins · axial · 0.78mm/px · z∈[-392,-88]mm · 18 of 338 slices shown]
[im 17/338  lung]
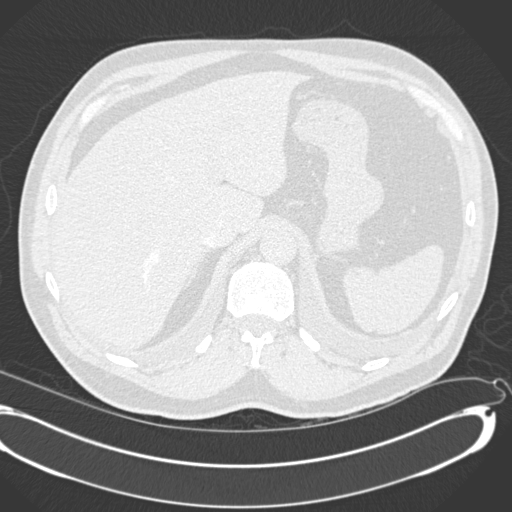
[im 34/338  mediastinal]
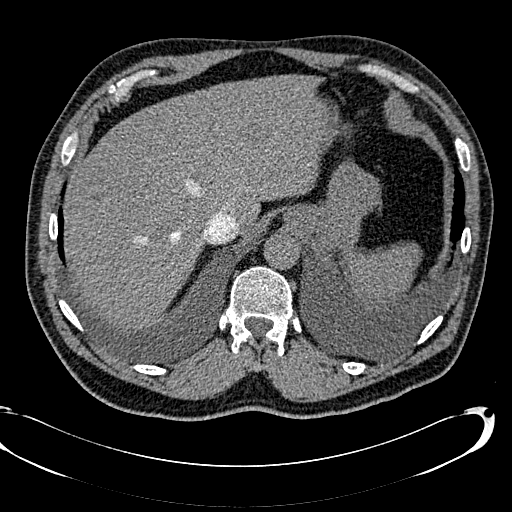
[im 51/338  lung]
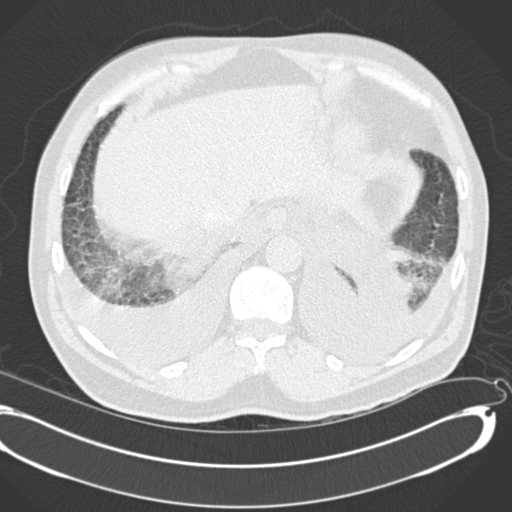
[im 68/338  mediastinal]
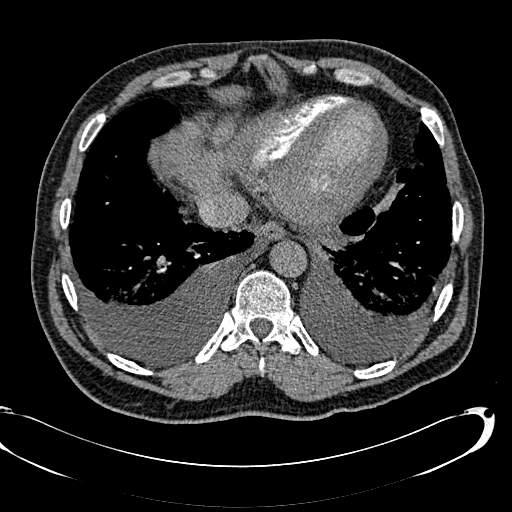
[im 85/338  lung]
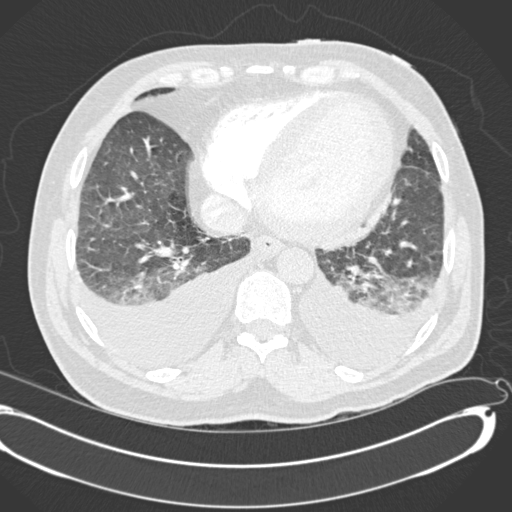
[im 102/338  mediastinal]
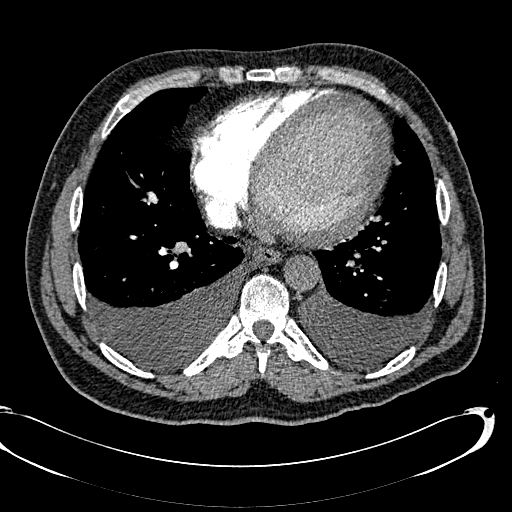
[im 118/338  lung]
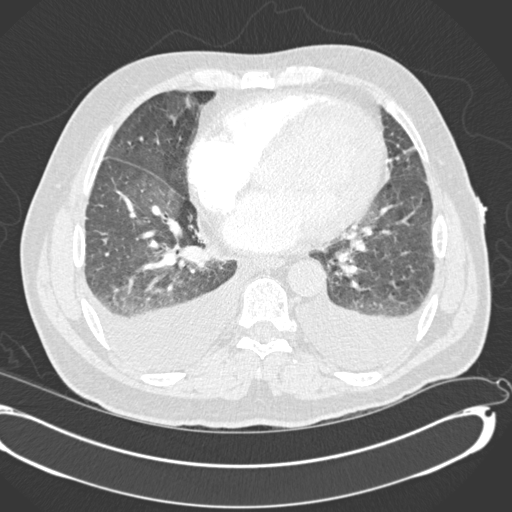
[im 135/338  mediastinal]
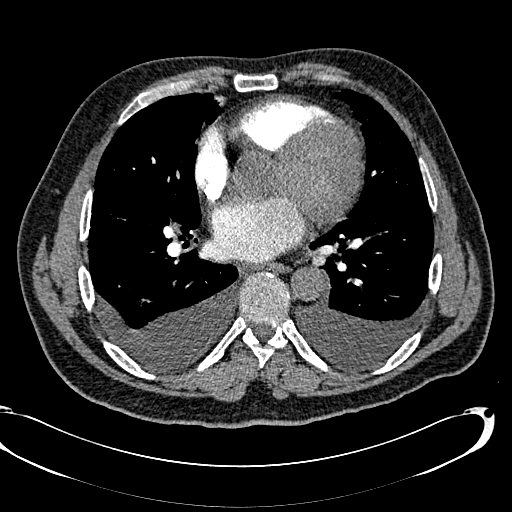
[im 152/338  lung]
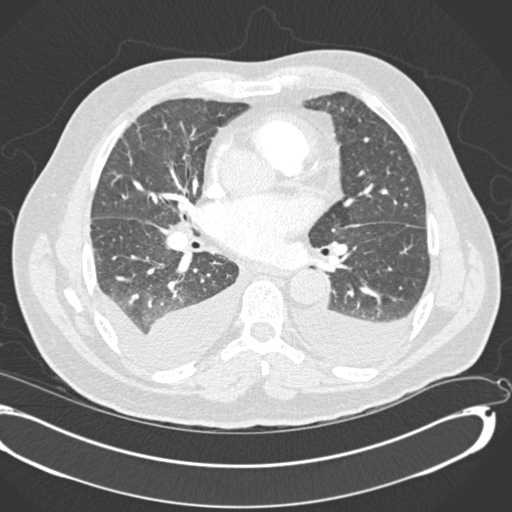
[im 186/338  mediastinal]
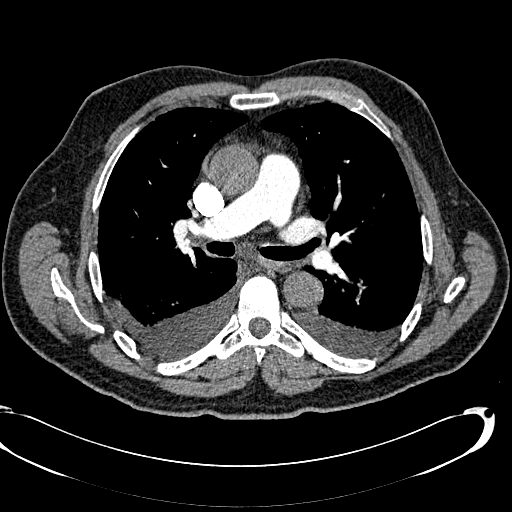
[im 203/338  lung]
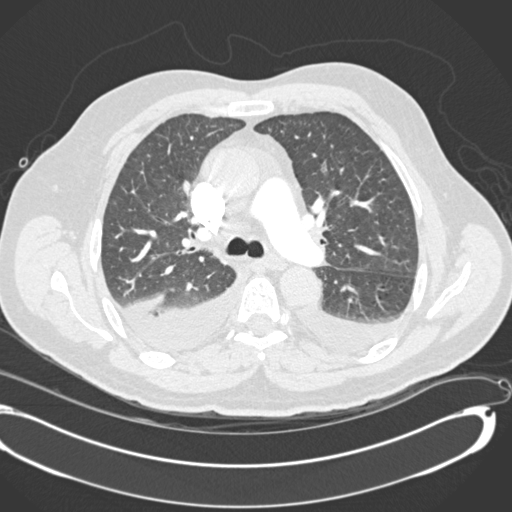
[im 220/338  mediastinal]
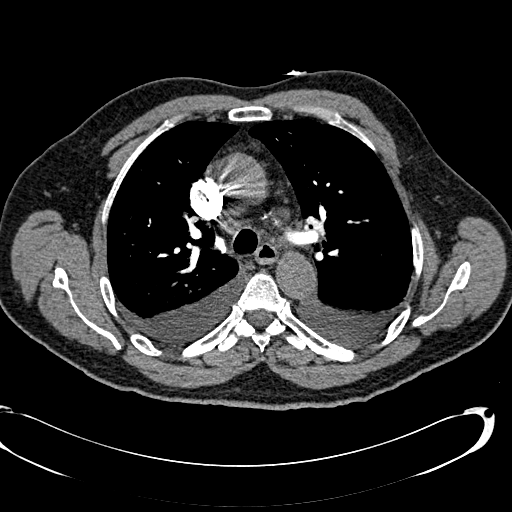
[im 236/338  lung]
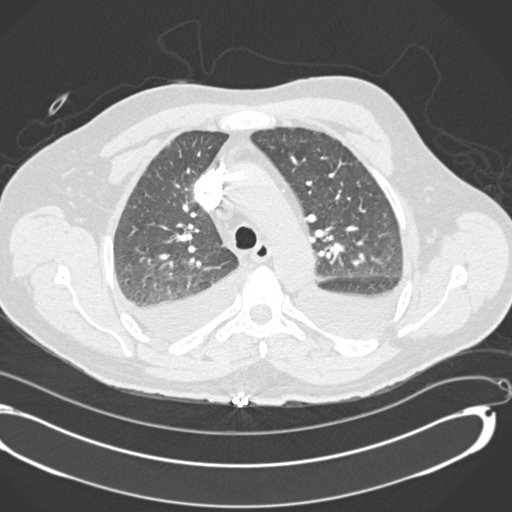
[im 253/338  mediastinal]
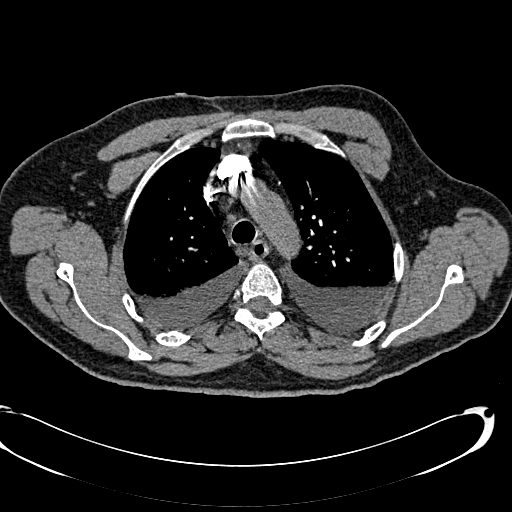
[im 270/338  lung]
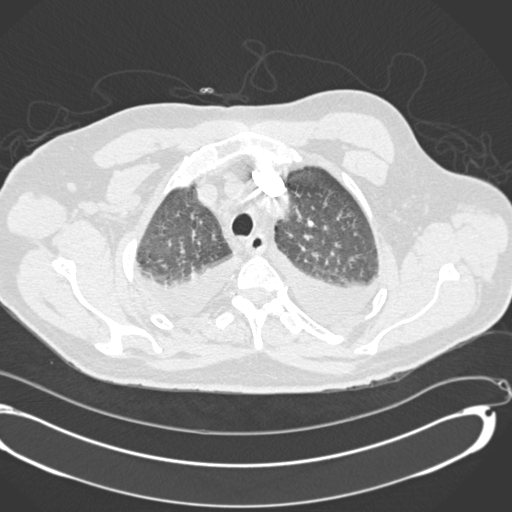
[im 287/338  mediastinal]
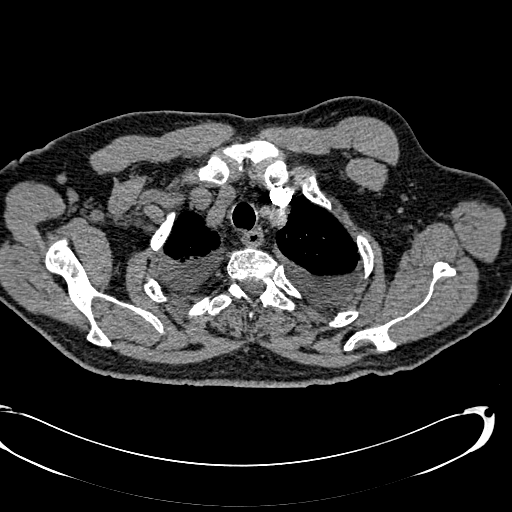
[im 304/338  lung]
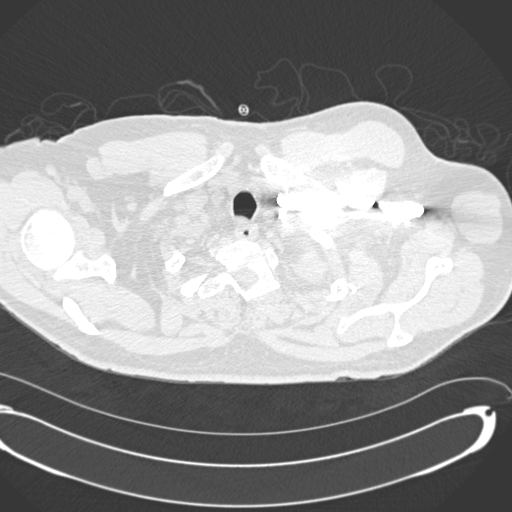
[im 321/338  mediastinal]
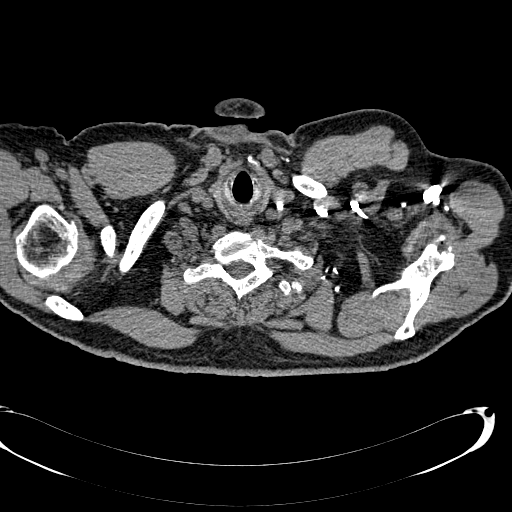

[Series 9: pulm embolism 2.0 spo thins · coronal · 0.83mm/px · 1 of 126 slices shown]
[im 63/126  mediastinal]
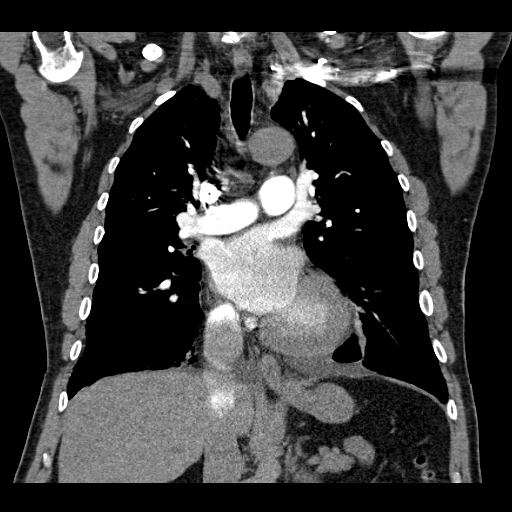

[19 of 36 positions shown; findings below may reference images not displayed]

FINDINGS: Pulmonary arterial opacification is satisfactory.  No
focal filling defects are present to suggest pulmonary emboli.

The heart is mildly enlarged.  Sub centimeter mediastinal lymph
nodes are present.  There are no pathologic mediastinal or axillary
lymph nodes.

Moderate to bilateral pleural effusions are present. Limited
imaging of the upper abdomen is unremarkable.

Bilateral, mostly dependent, airspace disease is seen bilaterally.
This is most significant at the bases.  A focal ill-defined area of
opacification is noted in the right upper lobe measuring 12 mm on
image 40 of series 5.

The bone windows are unremarkable.

Review of the MIP images confirms the above findings.
IMPRESSION: 1.  No evidence of pulmonary embolus.
2.  Moderate bilateral pleural effusions and associated airspace
disease, likely reflecting atelectasis.
3.  Minimal focal opacification of the right upper lobe, likely
reflecting atelectasis as well.

## 2016-04-14 DIAGNOSIS — C44629 Squamous cell carcinoma of skin of left upper limb, including shoulder: Secondary | ICD-10-CM

## 2016-04-14 HISTORY — DX: Squamous cell carcinoma of skin of left upper limb, including shoulder: C44.629

## 2020-09-21 DIAGNOSIS — Z95810 Presence of automatic (implantable) cardiac defibrillator: Secondary | ICD-10-CM

## 2020-09-21 HISTORY — DX: Presence of automatic (implantable) cardiac defibrillator: Z95.810

## 2021-09-09 HISTORY — PX: TRANSURETHRAL RESECTION OF PROSTATE: SHX73

## 2022-04-22 ENCOUNTER — Encounter (HOSPITAL_COMMUNITY): Payer: Self-pay

## 2022-08-07 ENCOUNTER — Other Ambulatory Visit: Payer: Self-pay

## 2022-08-07 ENCOUNTER — Observation Stay (HOSPITAL_COMMUNITY): Payer: Medicare PPO

## 2022-08-07 ENCOUNTER — Emergency Department (HOSPITAL_COMMUNITY): Payer: Medicare PPO

## 2022-08-07 ENCOUNTER — Encounter (HOSPITAL_COMMUNITY): Payer: Self-pay | Admitting: Student

## 2022-08-07 ENCOUNTER — Inpatient Hospital Stay (HOSPITAL_COMMUNITY)
Admission: EM | Admit: 2022-08-07 | Discharge: 2022-08-12 | DRG: 286 | Disposition: A | Discharge status: Home / Self Care | Payer: Medicare PPO | Attending: Family Medicine | Admitting: Family Medicine

## 2022-08-07 DIAGNOSIS — I5020 Unspecified systolic (congestive) heart failure: Secondary | ICD-10-CM

## 2022-08-07 DIAGNOSIS — E876 Hypokalemia: Secondary | ICD-10-CM | POA: Diagnosis present

## 2022-08-07 DIAGNOSIS — Z79899 Other long term (current) drug therapy: Secondary | ICD-10-CM

## 2022-08-07 DIAGNOSIS — Z8701 Personal history of pneumonia (recurrent): Secondary | ICD-10-CM

## 2022-08-07 DIAGNOSIS — R0602 Shortness of breath: Secondary | ICD-10-CM | POA: Diagnosis not present

## 2022-08-07 DIAGNOSIS — D649 Anemia, unspecified: Secondary | ICD-10-CM

## 2022-08-07 DIAGNOSIS — Z7901 Long term (current) use of anticoagulants: Secondary | ICD-10-CM

## 2022-08-07 DIAGNOSIS — D509 Iron deficiency anemia, unspecified: Secondary | ICD-10-CM | POA: Diagnosis present

## 2022-08-07 DIAGNOSIS — Z7982 Long term (current) use of aspirin: Secondary | ICD-10-CM

## 2022-08-07 DIAGNOSIS — Z803 Family history of malignant neoplasm of breast: Secondary | ICD-10-CM

## 2022-08-07 DIAGNOSIS — I130 Hypertensive heart and chronic kidney disease with heart failure and stage 1 through stage 4 chronic kidney disease, or unspecified chronic kidney disease: Secondary | ICD-10-CM | POA: Diagnosis not present

## 2022-08-07 DIAGNOSIS — T447X5A Adverse effect of beta-adrenoreceptor antagonists, initial encounter: Secondary | ICD-10-CM | POA: Diagnosis present

## 2022-08-07 DIAGNOSIS — I952 Hypotension due to drugs: Secondary | ICD-10-CM | POA: Diagnosis present

## 2022-08-07 DIAGNOSIS — I5022 Chronic systolic (congestive) heart failure: Principal | ICD-10-CM

## 2022-08-07 DIAGNOSIS — Z7984 Long term (current) use of oral hypoglycemic drugs: Secondary | ICD-10-CM

## 2022-08-07 DIAGNOSIS — I252 Old myocardial infarction: Secondary | ICD-10-CM

## 2022-08-07 DIAGNOSIS — E785 Hyperlipidemia, unspecified: Secondary | ICD-10-CM | POA: Diagnosis present

## 2022-08-07 DIAGNOSIS — I5023 Acute on chronic systolic (congestive) heart failure: Secondary | ICD-10-CM | POA: Diagnosis present

## 2022-08-07 DIAGNOSIS — I4891 Unspecified atrial fibrillation: Secondary | ICD-10-CM

## 2022-08-07 DIAGNOSIS — R17 Unspecified jaundice: Secondary | ICD-10-CM | POA: Diagnosis present

## 2022-08-07 DIAGNOSIS — J90 Pleural effusion, not elsewhere classified: Secondary | ICD-10-CM

## 2022-08-07 DIAGNOSIS — I2510 Atherosclerotic heart disease of native coronary artery without angina pectoris: Secondary | ICD-10-CM | POA: Diagnosis present

## 2022-08-07 DIAGNOSIS — I428 Other cardiomyopathies: Secondary | ICD-10-CM | POA: Diagnosis present

## 2022-08-07 DIAGNOSIS — Z8249 Family history of ischemic heart disease and other diseases of the circulatory system: Secondary | ICD-10-CM

## 2022-08-07 DIAGNOSIS — N400 Enlarged prostate without lower urinary tract symptoms: Secondary | ICD-10-CM | POA: Diagnosis present

## 2022-08-07 DIAGNOSIS — I4510 Unspecified right bundle-branch block: Secondary | ICD-10-CM | POA: Diagnosis present

## 2022-08-07 DIAGNOSIS — Z6825 Body mass index (BMI) 25.0-25.9, adult: Secondary | ICD-10-CM

## 2022-08-07 DIAGNOSIS — I509 Heart failure, unspecified: Secondary | ICD-10-CM

## 2022-08-07 DIAGNOSIS — K824 Cholesterolosis of gallbladder: Secondary | ICD-10-CM | POA: Diagnosis present

## 2022-08-07 DIAGNOSIS — Z95810 Presence of automatic (implantable) cardiac defibrillator: Secondary | ICD-10-CM

## 2022-08-07 DIAGNOSIS — I447 Left bundle-branch block, unspecified: Secondary | ICD-10-CM | POA: Diagnosis present

## 2022-08-07 DIAGNOSIS — R7989 Other specified abnormal findings of blood chemistry: Secondary | ICD-10-CM | POA: Diagnosis present

## 2022-08-07 DIAGNOSIS — E440 Moderate protein-calorie malnutrition: Secondary | ICD-10-CM | POA: Diagnosis present

## 2022-08-07 DIAGNOSIS — I4819 Other persistent atrial fibrillation: Secondary | ICD-10-CM | POA: Diagnosis present

## 2022-08-07 DIAGNOSIS — I472 Ventricular tachycardia, unspecified: Secondary | ICD-10-CM | POA: Diagnosis not present

## 2022-08-07 DIAGNOSIS — Z87891 Personal history of nicotine dependence: Secondary | ICD-10-CM

## 2022-08-07 DIAGNOSIS — N1831 Chronic kidney disease, stage 3a: Secondary | ICD-10-CM | POA: Diagnosis present

## 2022-08-07 DIAGNOSIS — R739 Hyperglycemia, unspecified: Secondary | ICD-10-CM | POA: Diagnosis present

## 2022-08-07 LAB — HEPATIC FUNCTION PANEL
ALT: 37 U/L (ref 0–44)
AST: 52 U/L — ABNORMAL HIGH (ref 15–41)
Albumin: 3.4 g/dL — ABNORMAL LOW (ref 3.5–5.0)
Alkaline Phosphatase: 60 U/L (ref 38–126)
Bilirubin, Direct: 0.6 mg/dL — ABNORMAL HIGH (ref 0.0–0.2)
Indirect Bilirubin: 2.1 mg/dL — ABNORMAL HIGH (ref 0.3–0.9)
Total Bilirubin: 2.7 mg/dL — ABNORMAL HIGH (ref 0.3–1.2)
Total Protein: 5.5 g/dL — ABNORMAL LOW (ref 6.5–8.1)

## 2022-08-07 LAB — CBC
HCT: 31.5 % — ABNORMAL LOW (ref 39.0–52.0)
Hemoglobin: 9.1 g/dL — ABNORMAL LOW (ref 13.0–17.0)
MCH: 22.6 pg — ABNORMAL LOW (ref 26.0–34.0)
MCHC: 28.9 g/dL — ABNORMAL LOW (ref 30.0–36.0)
MCV: 78.4 fL — ABNORMAL LOW (ref 80.0–100.0)
Platelets: 231 10*3/uL (ref 150–400)
RBC: 4.02 MIL/uL — ABNORMAL LOW (ref 4.22–5.81)
RDW: 17.3 % — ABNORMAL HIGH (ref 11.5–15.5)
WBC: 8.2 10*3/uL (ref 4.0–10.5)
nRBC: 0 % (ref 0.0–0.2)

## 2022-08-07 LAB — TROPONIN I (HIGH SENSITIVITY)
Troponin I (High Sensitivity): 48 ng/L — ABNORMAL HIGH (ref <18)
Troponin I (High Sensitivity): 45 ng/L — ABNORMAL HIGH (ref <18)

## 2022-08-07 LAB — BASIC METABOLIC PANEL
Anion gap: 12 (ref 5–15)
BUN: 19 mg/dL (ref 8–23)
CO2: 20 mmol/L — ABNORMAL LOW (ref 22–32)
Calcium: 8.9 mg/dL (ref 8.9–10.3)
Chloride: 108 mmol/L (ref 98–111)
Creatinine, Ser: 1.46 mg/dL — ABNORMAL HIGH (ref 0.61–1.24)
GFR, Estimated: 52 mL/min — ABNORMAL LOW (ref >60)
Glucose, Bld: 164 mg/dL — ABNORMAL HIGH (ref 70–99)
Potassium: 3.7 mmol/L (ref 3.5–5.1)
Sodium: 140 mmol/L (ref 135–145)

## 2022-08-07 LAB — LACTIC ACID, PLASMA
Lactic Acid, Venous: 2.9 mmol/L (ref 0.5–1.9)
Lactic Acid, Venous: 2.9 mmol/L (ref 0.5–1.9)

## 2022-08-07 LAB — HIV ANTIBODY (ROUTINE TESTING W REFLEX): HIV Screen 4th Generation wRfx: NONREACTIVE

## 2022-08-07 LAB — BRAIN NATRIURETIC PEPTIDE: B Natriuretic Peptide: 872.8 pg/mL — ABNORMAL HIGH (ref 0.0–100.0)

## 2022-08-07 MED ORDER — APIXABAN 5 MG PO TABS
5.0000 mg | ORAL_TABLET | Freq: Two times a day (BID) | ORAL | Status: DC
  Administered 2022-08-07 – 2022-08-09 (×5): 5 mg via ORAL
  Filled 2022-08-07 (×5): qty 1

## 2022-08-07 MED ORDER — DIGOXIN 125 MCG PO TABS
0.2500 mg | ORAL_TABLET | Freq: Every day | ORAL | Status: DC
  Administered 2022-08-07 – 2022-08-08 (×2): 0.25 mg via ORAL
  Filled 2022-08-07 (×2): qty 2

## 2022-08-07 MED ORDER — EMPAGLIFLOZIN 10 MG PO TABS
10.0000 mg | ORAL_TABLET | Freq: Every day | ORAL | Status: DC
  Administered 2022-08-07: 10 mg via ORAL
  Filled 2022-08-07: qty 1

## 2022-08-07 MED ORDER — PANTOPRAZOLE SODIUM 40 MG PO TBEC
40.0000 mg | DELAYED_RELEASE_TABLET | Freq: Every day | ORAL | Status: DC
  Administered 2022-08-07 – 2022-08-12 (×6): 40 mg via ORAL
  Filled 2022-08-07 (×6): qty 1

## 2022-08-07 MED ORDER — IOHEXOL 350 MG/ML SOLN
75.0000 mL | Freq: Once | INTRAVENOUS | Status: AC | PRN
  Administered 2022-08-07: 75 mL via INTRAVENOUS

## 2022-08-07 MED ORDER — FUROSEMIDE 10 MG/ML IJ SOLN
80.0000 mg | Freq: Once | INTRAMUSCULAR | Status: AC
  Administered 2022-08-07: 80 mg via INTRAVENOUS
  Filled 2022-08-07: qty 8

## 2022-08-07 MED ORDER — FINASTERIDE 5 MG PO TABS
5.0000 mg | ORAL_TABLET | Freq: Every day | ORAL | Status: DC
  Administered 2022-08-07 – 2022-08-12 (×6): 5 mg via ORAL
  Filled 2022-08-07 (×6): qty 1

## 2022-08-07 MED ORDER — CARVEDILOL 12.5 MG PO TABS
12.5000 mg | ORAL_TABLET | Freq: Two times a day (BID) | ORAL | Status: DC
  Administered 2022-08-07 – 2022-08-09 (×3): 12.5 mg via ORAL
  Filled 2022-08-07 (×3): qty 1

## 2022-08-07 MED ORDER — PRAVASTATIN SODIUM 40 MG PO TABS
40.0000 mg | ORAL_TABLET | Freq: Every day | ORAL | Status: DC
  Administered 2022-08-07 – 2022-08-12 (×6): 40 mg via ORAL
  Filled 2022-08-07 (×6): qty 1

## 2022-08-07 MED ORDER — SACUBITRIL-VALSARTAN 24-26 MG PO TABS
1.0000 | ORAL_TABLET | Freq: Two times a day (BID) | ORAL | Status: DC
  Administered 2022-08-07: 1 via ORAL
  Filled 2022-08-07: qty 1

## 2022-08-07 MED ORDER — ISOSORBIDE MONONITRATE ER 30 MG PO TB24
30.0000 mg | ORAL_TABLET | Freq: Every day | ORAL | Status: DC
  Administered 2022-08-08: 30 mg via ORAL
  Filled 2022-08-07: qty 1

## 2022-08-07 MED ORDER — MELATONIN 3 MG PO TABS
3.0000 mg | ORAL_TABLET | Freq: Every day | ORAL | Status: DC
  Administered 2022-08-07 – 2022-08-11 (×5): 3 mg via ORAL
  Filled 2022-08-07 (×5): qty 1

## 2022-08-07 MED ORDER — TAMSULOSIN HCL 0.4 MG PO CAPS
0.4000 mg | ORAL_CAPSULE | Freq: Every day | ORAL | Status: DC
  Administered 2022-08-07 – 2022-08-11 (×6): 0.4 mg via ORAL
  Filled 2022-08-07 (×6): qty 1

## 2022-08-07 MED ORDER — ALBUTEROL SULFATE (2.5 MG/3ML) 0.083% IN NEBU
3.0000 mL | INHALATION_SOLUTION | Freq: Four times a day (QID) | RESPIRATORY_TRACT | Status: DC | PRN
  Administered 2022-08-11: 3 mL via RESPIRATORY_TRACT
  Filled 2022-08-07: qty 3

## 2022-08-07 MED ORDER — FUROSEMIDE 10 MG/ML IJ SOLN
40.0000 mg | Freq: Once | INTRAMUSCULAR | Status: AC
  Administered 2022-08-07: 40 mg via INTRAVENOUS
  Filled 2022-08-07: qty 4

## 2022-08-07 NOTE — Hospital Course (Addendum)
Austin [name] is a 67 y.o. male who was admitted to the [facility] Medicine Teaching Service at [facility] for CHF exacerbation. Hospital course is outlined below by problem.   CHF exacerbation Presented with shortness of breath, tachycardia, tachypnea.  History of EF 15-20% in 03/2022.  Placed on 3 L O2 at that time.  BNP 872, troponin 48, 45.  CXR with pleural effusions.  Admitted for volume overload.  Given multiple doses of Lasix with good response.  Continued on home meds.  Repeat echo with stable EF of <20%, and concern for ICD lead vegetation. Cardiology consulted who was less concerned for vegetation. They recommended COOX for further monitoring of poor perfusion.  Ultimately underwent heart cath with minimal CAD, severe NICM EF 10%, mildly elevated filling pressures with severely reduced cardiac output.   Initiated LVAD workup while inpatient.  Heart failure team plan to reassess in 4 to 6 weeks for need for LVAD.  At discharge patient was on below cardiac medications: -Amiodarone 200 mg twice daily -Eliquis 5 mg twice daily -Jardiance 10 mg daily -Digoxin 0.125 mg daily -Spironolactone 12.5 mg daily -Torsemide 40 mg daily -Losartan 12.5 g daily  Medications that were stopped: Carvedilol, Bumex, Entresto  Atrial fibrillation Presented with heart rates 100s to 110s.  Home Coreg and Eliquis continued.  Coreg stopped due to lower BP.  Placed on amiodarone drip.  By discharge, patient converted to normal sinus rhythm.  Discharged on p.o. amiodarone.  Microcytic anemia Hemoglobin 9.1 with MCV 78.4.  No evidence of bleeding on admission. Unknown baseline.  Anemia panel demonstrated IDA.  He received 2 IV iron infusions over admission.  By discharge, hemoglobin was stable at 9.4.  Chronic stable conditions CKD 3-creatinine 1.4, stable slight increase with diuresis BPH-continued Flomax, finasteride Hypertension-medications adjusted as needed to account for heart failure and lower blood  pressures. HLD-continued pravastatin  Issues for follow up: Repeat RUQ ultrasound for gallbladder polyps in 6 months time. GI Evaluation outpatient for anemia. Repeat BMP, CBC Ensure heart failure team follow-up.

## 2022-08-07 NOTE — ED Notes (Signed)
ED TO INPATIENT HANDOFF REPORT  ED Nurse Name and Phone #: [phone]  S Name/Age/Gender Austin [name] 67 y.o. male Room/Bed: 028C/028C  Code Status   Code Status: Full Code  Home/SNF/Other Home Patient oriented to: self, place, time, and situation Is this baseline? Yes   Triage Complete: Triage complete  Chief Complaint Acute exacerbation of CHF (congestive heart failure) (HCC) [I50.9]  Triage Note Pt. Stated, Austin [name] had chest pain, arm, neck and face pain. Ive also had some stomach issue feeling like somebody is pulling my insides out. Im SOB all the time. I have a defibrillator. This started about 3 weeks ago.   Allergies No Known Allergies  Level of Care/Admitting Diagnosis ED Disposition     ED Disposition  Admit   Condition  --   Comment  Hospital Area: [facility] [[id number]]  Level of Care: Telemetry Medical [[id number]]  May place patient in observation at [facility] or [facility] if equivalent level of care is available:: No  Covid Evaluation: Asymptomatic - no recent exposure (last 10 days) testing not required  Diagnosis: Acute exacerbation of CHF (congestive heart failure) [facility]) [[id number]]  Admitting Physician: Celine Mans [[id number]]  Attending Physician: Westley Chandler [[id number]]          B Medical/Surgery History Past Medical History:  Diagnosis Date   Benign prostatic hypertrophy    Hyperlipidemia    Nonischemic cardiomyopathy (HCC) Oct 2012   cath 12/10/10 minimal nonobstructive coronary artery disease   Systolic heart failure Oct 2012   echo 12/08/10 EF 20%, severe left ventricular enlargement, mild right ventricular enlargement   Past Surgical History:  Procedure Laterality Date   HAND SURGERY  1979   left hand machine trauma     A IV Location/Drains/Wounds Patient Lines/Drains/Airways Status     Active Line/Drains/Airways     Name Placement date Placement time Site Days   Peripheral IV 08/07/22 20 G Right  Antecubital 08/07/22  0919  Antecubital  less than 1            Intake/Output Last 24 hours  Intake/Output Summary (Last 24 hours) at 08/07/2022 1647 Last data filed at 08/07/2022 1410 Gross per 24 hour  Intake --  Output 800 ml  Net -800 ml    Labs/Imaging Results for orders placed or performed during the hospital encounter of 08/07/22 (from the past 48 hour(s))  Basic metabolic panel     Status: Abnormal   Collection Time: 08/07/22  7:19 AM  Result Value Ref Range   Sodium 140 135 - 145 mmol/L   Potassium 3.7 3.5 - 5.1 mmol/L   Chloride 108 98 - 111 mmol/L   CO2 20 (L) 22 - 32 mmol/L   Glucose, Bld 164 (H) 70 - 99 mg/dL    Comment: Glucose reference range applies only to samples taken after fasting for at least 8 hours.   BUN 19 8 - 23 mg/dL   Creatinine, Ser 5.78 (H) 0.61 - 1.24 mg/dL   Calcium 8.9 8.9 - 46.9 mg/dL   GFR, Estimated 52 (L) >60 mL/min    Comment: (NOTE) Calculated using the CKD-EPI Creatinine Equation (2021)    Anion gap 12 5 - 15    Comment: Performed at [facility], [address], [place], [place] [address]  CBC     Status: Abnormal   Collection Time: 08/07/22  7:19 AM  Result Value Ref Range   WBC 8.2 4.0 - 10.5 K/uL   RBC 4.02 (L)  4.22 - 5.81 MIL/uL   Hemoglobin 9.1 (L) 13.0 - 17.0 g/dL   HCT 81.1 (L) 91.4 - 78.2 %   MCV 78.4 (L) 80.0 - 100.0 fL   MCH 22.6 (L) 26.0 - 34.0 pg   MCHC 28.9 (L) 30.0 - 36.0 g/dL   RDW 95.6 (H) 21.3 - 08.6 %   Platelets 231 150 - 400 K/uL   nRBC 0.0 0.0 - 0.2 %    Comment: Performed at [facility], [address] [place], [place] [address]  Troponin I (High Sensitivity)     Status: Abnormal   Collection Time: 08/07/22  7:19 AM  Result Value Ref Range   Troponin I (High Sensitivity) 48 (H) <18 ng/L    Comment: (NOTE) Elevated high sensitivity troponin I (hsTnI) values and significant  changes across serial measurements may suggest ACS but many other  chronic and acute conditions are known to  elevate hsTnI results.  Refer to the "Links" section for chest pain algorithms and additional  guidance. Performed at [facility], [address], [place], [place] [address]   Brain natriuretic peptide     Status: Abnormal   Collection Time: 08/07/22  7:19 AM  Result Value Ref Range   B Natriuretic Peptide 872.8 (H) 0.0 - 100.0 pg/mL    Comment: Performed at [facility], [address], [place], [place] [address]  Troponin I (High Sensitivity)     Status: Abnormal   Collection Time: 08/07/22  9:12 AM  Result Value Ref Range   Troponin I (High Sensitivity) 45 (H) <18 ng/L    Comment: (NOTE) Elevated high sensitivity troponin I (hsTnI) values and significant  changes across serial measurements may suggest ACS but many other  chronic and acute conditions are known to elevate hsTnI results.  Refer to the "Links" section for chest pain algorithms and additional  guidance. Performed at [facility], [address] [place], [place] [address]   Hepatic function panel     Status: Abnormal   Collection Time: 08/07/22  9:12 AM  Result Value Ref Range   Total Protein 5.5 (L) 6.5 - 8.1 g/dL   Albumin 3.4 (L) 3.5 - 5.0 g/dL   AST 52 (H) 15 - 41 U/L    Comment: HEMOLYSIS AT THIS LEVEL MAY AFFECT RESULT   ALT 37 0 - 44 U/L    Comment: HEMOLYSIS AT THIS LEVEL MAY AFFECT RESULT   Alkaline Phosphatase 60 38 - 126 U/L   Total Bilirubin 2.7 (H) 0.3 - 1.2 mg/dL    Comment: HEMOLYSIS AT THIS LEVEL MAY AFFECT RESULT   Bilirubin, Direct 0.6 (H) 0.0 - 0.2 mg/dL    Comment: HEMOLYSIS AT THIS LEVEL MAY AFFECT RESULT   Indirect Bilirubin 2.1 (H) 0.3 - 0.9 mg/dL    Comment: Performed at [facility], [address], [place], [place] [address]   US Abdomen Limited RUQ (LIVER/GB)  Result Date: 08/07/2022 CLINICAL DATA:  [id number] Abdominal pain [id number] EXAM: ULTRASOUND ABDOMEN LIMITED RIGHT UPPER QUADRANT COMPARISON:  CT 08/07/2022 FINDINGS: Gallbladder: No gallstones or wall  thickening visualized. No sonographic Murphy sign noted by sonographer. Common bile duct: Diameter: 2 mm. Liver: No focal lesion identified. Mildly increased hepatic parenchymal echogenicity. Portal vein is patent on color Doppler imaging with normal direction of blood flow towards the liver. Other: None. IMPRESSION: 1. Unremarkable sonographic appearance of the gallbladder. 2. The echogenicity of the liver is mildly increased. This is a nonspecific finding  but is most commonly seen with fatty infiltration of the liver. 3. No focal liver lesion identified sonographically to correspond to the CT findings. Electronically Signed   By: Duanne Guess D.O.   On: 08/07/2022 12:45   CT ABDOMEN PELVIS W CONTRAST  Result Date: 08/07/2022 CLINICAL DATA:  Abdominal pain, acute. EXAM: CT ABDOMEN AND PELVIS WITH CONTRAST TECHNIQUE: Multidetector CT imaging of the abdomen and pelvis was performed using the standard protocol following bolus administration of intravenous contrast. RADIATION DOSE REDUCTION: This exam was performed according to the departmental dose-optimization program which includes automated exposure control, adjustment of the mA and/or kV according to patient size and/or use of iterative reconstruction technique. CONTRAST:  75mL OMNIPAQUE IOHEXOL 350 MG/ML SOLN COMPARISON:  None Available. FINDINGS: Lower chest: Artifact from ICD wires identified within the heart. Bilateral pleural effusions, right greater than left. Asymmetric scratch ground-glass attenuation and interlobular septal thickening identified within the lung bases. Hepatobiliary: Low attenuation structure along the lateral dome of liver measures 0.9 by 0.7 cm and is too small to reliably characterize. Focal enhancing lesion within the lateral segment of left hepatic lobe measures 1.3 cm and is favored to represent either a hemangioma or benign vascular abnormality. Gallbladder appears normal. Small stone or polyp noted within the gallbladder  measuring 4 mm. Mild low-density thickening of the gallbladder wall appears asymmetric measuring up to 5 mm. No surrounding fat stranding. No evidence for bile duct dilatation. Pancreas: Unremarkable. No pancreatic ductal dilatation or surrounding inflammatory changes. Spleen: The spleen appears normal in size. Three focal enhancing lesions within the spleen are noted which measure up to 5 mm, image 35/3. Adrenals/Urinary Tract: Normal adrenal glands. No suspicious kidney mass, nephrolithiasis, or hydronephrosis. The urinary bladder is unremarkable. Stomach/Bowel: Stomach is normal. The appendix is visualized and is within normal limits. No pathologic dilatation of the bowel loops to suggest obstruction. Sigmoid diverticulosis is identified without signs of acute diverticulitis. Vascular/Lymphatic: Aortic atherosclerosis. No signs of abdominopelvic adenopathy. Reproductive: Prostate gland enlargement with mass effect upon the base of bladder. Other: No free fluid or fluid collections. Musculoskeletal: No acute or suspicious osseous findings. L5-S1 degenerative disc disease. IMPRESSION: 1. Bilateral pleural effusions, right greater than left. Ground-glass attenuation and interlobular septal thickening identified within the lung bases. Correlate for any clinical signs or symptoms of CHF. 2. Small stone or polyp within the gallbladder measuring 4 mm. Mild low-density thickening of the gallbladder wall appears asymmetric measuring up to 5 mm. No surrounding fat stranding. If there is a clinical concern for acute cholecystitis consider further evaluation with right upper quadrant ultrasound. 3. Three focal enhancing lesions within the spleen are noted which measure up to 5 mm. These are nonspecific and may represent small hemangiomas or benign vascular lesions. 4. Prostate gland enlargement with mass effect upon the base of bladder. 5.  Aortic Atherosclerosis (ICD10-I70.0). Electronically Signed   By: Signa Kell M.D.    On: 08/07/2022 10:42   DG Chest 2 View  Result Date: 08/07/2022 CLINICAL DATA:  Chest pain EXAM: CHEST - 2 VIEW COMPARISON:  12/11/2010 FINDINGS: Mild cardiac enlargement. Left chest wall ICD noted with lead in the right ventricle. Small bilateral pleural effusions. No frank interstitial edema or airspace consolidation. Visualized osseous structures are unremarkable. IMPRESSION: Mild cardiac enlargement and small bilateral pleural effusions. Correlate for any clinical signs/symptoms of early CHF. Electronically Signed   By: Signa Kell M.D.   On: 08/07/2022 08:20    Pending Labs Unresulted Labs (From admission, onward)  Start     Ordered   08/08/22 0500  Comprehensive metabolic panel  Tomorrow morning,   R        08/07/22 1544   08/08/22 0500  CBC  Tomorrow morning,   R        08/07/22 1544   08/08/22 0500  Magnesium  Tomorrow morning,   R        08/07/22 1544   08/08/22 0500  Vitamin B12  (Anemia Panel (PNL))  Tomorrow morning,   R        08/07/22 1607   08/08/22 0500  Folate  (Anemia Panel (PNL))  Tomorrow morning,   R        08/07/22 1607   08/08/22 0500  Iron and TIBC  (Anemia Panel (PNL))  Tomorrow morning,   R        08/07/22 1607   08/08/22 0500  Ferritin  (Anemia Panel (PNL))  Tomorrow morning,   R        08/07/22 1607   08/08/22 0500  Reticulocytes  (Anemia Panel (PNL))  Tomorrow morning,   R        08/07/22 1607   08/07/22 1541  Lactic acid, plasma  STAT Now then every 3 hours,   R (with STAT occurrences)      08/07/22 1544   08/07/22 1535  HIV Antibody (routine testing w rflx)  (HIV Antibody (Routine testing w reflex) panel)  Once,   R        08/07/22 1544            Vitals/Pain Today's Vitals   08/07/22 1304 08/07/22 1429 08/07/22 1439 08/07/22 1440  BP:    (!) 149/125  Pulse:  (!) 110  (!) 107  Resp:  (!) 23  (!) 25  Temp: 98.2 F (36.8 C)     TempSrc: Oral     SpO2:  98%  97%  Weight:      Height:      PainSc:   0-No pain     Isolation  Precautions No active isolations  Medications Medications  carvedilol (COREG) tablet 12.5 mg (has no administration in time range)  digoxin (LANOXIN) tablet 0.25 mg (has no administration in time range)  sacubitril-valsartan (ENTRESTO) 24-26 mg per tablet (has no administration in time range)  isosorbide mononitrate (IMDUR) 24 hr tablet 30 mg (has no administration in time range)  pravastatin (PRAVACHOL) tablet 40 mg (has no administration in time range)  empagliflozin (JARDIANCE) tablet 10 mg (has no administration in time range)  pantoprazole (PROTONIX) EC tablet 40 mg (has no administration in time range)  tamsulosin (FLOMAX) capsule 0.4 mg (has no administration in time range)  finasteride (PROSCAR) tablet 5 mg (has no administration in time range)  apixaban (ELIQUIS) tablet 5 mg (has no administration in time range)  melatonin tablet 3 mg (has no administration in time range)  albuterol (PROVENTIL) (2.5 MG/3ML) 0.083% nebulizer solution 3 mL (has no administration in time range)  furosemide (LASIX) injection 80 mg (has no administration in time range)  iohexol (OMNIPAQUE) 350 MG/ML injection 75 mL (75 mLs Intravenous Contrast Given 08/07/22 1031)  furosemide (LASIX) injection 40 mg (40 mg Intravenous Given 08/07/22 1307)    Mobility walks     Focused Assessments Cardiac Assessment Handoff:  Cardiac Rhythm: Atrial fibrillation Lab Results  Component Value Date   CKTOTAL 161 12/09/2010   CKMB 4.7 (H) 12/09/2010   TROPONINI <0.30 12/09/2010   No results found for: "DDIMER" Does the  Patient currently have chest pain? Yes    R Recommendations: See Admitting Provider Note  Report given to:   Additional Notes: patient get short of breath  when talk gave him Lasix earlier and getting ready to give him more

## 2022-08-07 NOTE — Assessment & Plan Note (Signed)
Heart rate controlled 100s to 110s on exam today.  Currently in A-fib. -Continue home Coreg -Continue Eliquis 

## 2022-08-07 NOTE — ED Provider Notes (Signed)
[place] EMERGENCY DEPARTMENT AT [facility] Provider Note   CSN: [id number] Arrival date & time: 08/07/22  6962     History  Chief Complaint  Patient presents with   Chest Pain   Arm Pain   Facial Pain   Neck Pain   Abdominal Pain    Austin [name] is a 67 y.o. male with past medical history significant for hypertension, chronic systolic heart failure, nonischemic cardiomyopathy, hyperlipidemia, A-fib on chronic anticoagulation with Eliquis presents to the ED complaining of worsening shortness of breath, abdominal pain, diarrhea, left shoulder and arm pain.  Patient also reports that earlier this morning around 0500 he had left-sided neck pain that radiated into his face after he woke up, but this has since resolved.  Patient has intermittent left shoulder and elbow pain that he states is "unbearable".  He has had shortness of breath for the past few weeks and has been evaluated at [facility].  He has been compliant with his medications.  Patient states this morning he had 2 episodes of vomiting as well.  Denies fever, chest pain, dysuria, light-headedness, syncope, weakness.         Home Medications Prior to Admission medications   Medication Sig Start Date End Date Taking? Authorizing Provider  albuterol (VENTOLIN HFA) 108 (90 Base) MCG/ACT inhaler Inhale 1-2 puffs into the lungs every 6 (six) hours as needed. 05/03/22 05/03/23 Yes [provider]  apixaban (ELIQUIS) 5 MG TABS tablet Take 5 mg by mouth 2 (two) times daily. 05/25/22  Yes [provider]  aspirin 81 MG tablet Take 81 mg by mouth daily.     Yes [provider]  benzonatate (TESSALON) 100 MG capsule Take 100 mg by mouth every 8 (eight) hours. 02/24/22  Yes [provider]  bumetanide (BUMEX) 2 MG tablet Take 2 mg by mouth 2 (two) times daily. 06/29/22  Yes [provider]  carvedilol (COREG) 12.5 MG tablet Take 12.5 mg by mouth 2 (two) times daily with a meal. 06/03/11   Yes Clegg, Amy D, NP  Cholecalciferol 50 MCG (2000 UT) TABS Take 1 tablet by mouth daily. 06/11/19  Yes [provider]  digoxin (LANOXIN) 0.25 MG tablet Take 0.25 mg by mouth daily. 12/30/12  Yes [provider]  ENTRESTO 24-26 MG Take 1 tablet by mouth 2 (two) times daily. 07/15/22  Yes [provider]  fenofibrate micronized (LOFIBRA) 67 MG capsule Take 67 mg by mouth every morning. 05/25/22  Yes [provider]  ferrous sulfate 325 (65 FE) MG tablet Take 325 mg by mouth. 05/29/19  Yes [provider]  finasteride (PROSCAR) 5 MG tablet Take 1 tablet by mouth daily. 05/08/19  Yes [provider]  hydrocortisone 2.5 % cream Apply 1 Application topically. 12/30/12  Yes [provider]  isosorbide mononitrate (IMDUR) 30 MG 24 hr tablet Take 1 tablet by mouth daily. 04/19/19  Yes [provider]  JARDIANCE 10 MG TABS tablet Take 10 mg by mouth daily. 03/28/22  Yes [provider]  ketoconazole (NIZORAL) 2 % shampoo Apply 1 Application topically 3 (three) times a week. 12/30/12  Yes [provider]  loratadine (CLARITIN) 10 MG tablet Take 10 mg by mouth daily. 12/30/12  Yes [provider]  losartan (COZAAR) 50 MG tablet Take 1 tablet by mouth daily. 05/29/19  Yes [provider]  melatonin 3 MG TABS tablet Take 3 mg by mouth at bedtime. 02/03/22  Yes [provider]  omeprazole (PRILOSEC)  20 MG capsule Take 20 mg by mouth daily.   Yes [provider]  pravastatin (PRAVACHOL) 40 MG tablet Take 1 tablet (40 mg total) by mouth daily. 01/24/11 08/07/22 Yes Bensimhon, Bevelyn Buckles, MD  Tamsulosin HCl (FLOMAX) 0.4 MG CAPS Take 0.4 mg by mouth daily after supper.   Yes [provider]  ciprofloxacin (CIPRO) 500 MG tablet Take 500 mg by mouth 2 (two) times daily. Patient not taking: Reported on 08/07/2022 05/30/19   [provider]  furosemide (LASIX) 20 MG tablet Take 20 mg by  mouth daily. Patient not taking: Reported on 08/07/2022 05/30/19   [provider]  lisinopril (PRINIVIL,ZESTRIL) 10 MG tablet Take 0.5 tablets (5 mg total) by mouth 2 (two) times daily. Patient not taking: Reported on 08/07/2022 01/24/11   Bensimhon, Bevelyn Buckles, MD  losartan (COZAAR) 25 MG tablet Take 25 mg by mouth every morning. Patient not taking: Reported on 08/07/2022 05/26/22   [provider]  spironolactone (ALDACTONE) 25 MG tablet Take 1 tablet (25 mg total) by mouth daily. Patient not taking: Reported on 08/07/2022 03/31/11   Sherald Hess, NP      Allergies    Patient has no known allergies.    Review of Systems   Review of Systems  Constitutional:  Negative for fever.  Respiratory:  Positive for shortness of breath.   Cardiovascular:  Negative for chest pain.  Gastrointestinal:  Positive for abdominal pain, diarrhea, nausea and vomiting.  Genitourinary:  Negative for dysuria.  Musculoskeletal:  Positive for arthralgias and neck pain.  Neurological:  Negative for syncope, weakness and light-headedness.    Physical Exam Updated Vital Signs BP (!) 115/100   Pulse (!) 110   Temp 98.2 F (36.8 C) (Oral)   Resp (!) 23   Ht 6\' 4"  (1.93 m)   Wt 100.7 kg   SpO2 98%   BMI 27.02 kg/m  Physical Exam Vitals and nursing note reviewed.  Constitutional:      General: He is not in acute distress.    Appearance: He is ill-appearing. He is not toxic-appearing.  HENT:     Mouth/Throat:     Mouth: Mucous membranes are moist.     Pharynx: Oropharynx is clear.  Eyes:     Extraocular Movements: Extraocular movements intact.     Comments: Mild scleral icterus noted.   Cardiovascular:     Rate and Rhythm: Normal rate. Rhythm irregularly irregular.     Pulses: Normal pulses.     Heart sounds: Normal heart sounds.  Pulmonary:     Effort: Pulmonary effort is normal. Tachypnea present. No accessory muscle usage or respiratory distress.     Breath sounds: Normal air entry.  Examination of the right-upper field reveals rales. Examination of the right-middle field reveals rales. Examination of the right-lower field reveals rales. Examination of the left-lower field reveals rales. Rales present.  Abdominal:     General: Abdomen is flat. Bowel sounds are normal. There is no distension.     Palpations: Abdomen is soft.     Tenderness: There is abdominal tenderness.     Hernia: No hernia is present.  Skin:    General: Skin is warm and dry.     Capillary Refill: Capillary refill takes less than 2 seconds.  Neurological:     Mental Status: He is alert. Mental status is at baseline.  Psychiatric:        Mood and Affect: Mood normal.        Behavior:  Behavior normal.     ED Results / Procedures / Treatments   Labs (all labs ordered are listed, but only abnormal results are displayed) Labs Reviewed  BASIC METABOLIC PANEL - Abnormal; Notable for the following components:      Result Value   CO2 20 (*)    Glucose, Bld 164 (*)    Creatinine, Ser 1.46 (*)    GFR, Estimated 52 (*)    All other components within normal limits  CBC - Abnormal; Notable for the following components:   RBC 4.02 (*)    Hemoglobin 9.1 (*)    HCT 31.5 (*)    MCV 78.4 (*)    MCH 22.6 (*)    MCHC 28.9 (*)    RDW 17.3 (*)    All other components within normal limits  HEPATIC FUNCTION PANEL - Abnormal; Notable for the following components:   Total Protein 5.5 (*)    Albumin 3.4 (*)    AST 52 (*)    Total Bilirubin 2.7 (*)    Bilirubin, Direct 0.6 (*)    Indirect Bilirubin 2.1 (*)    All other components within normal limits  BRAIN NATRIURETIC PEPTIDE - Abnormal; Notable for the following components:   B Natriuretic Peptide 872.8 (*)    All other components within normal limits  TROPONIN I (HIGH SENSITIVITY) - Abnormal; Notable for the following components:   Troponin I (High Sensitivity) 48 (*)    All other components within normal limits  TROPONIN I (HIGH SENSITIVITY) -  Abnormal; Notable for the following components:   Troponin I (High Sensitivity) 45 (*)    All other components within normal limits    EKG EKG Interpretation  Date/Time:  Sunday August 07 2022 08:34:38 EDT Ventricular Rate:  98 PR Interval:    QRS Duration: 154 QT Interval:  429 QTC Calculation: 548 R Axis:   184 Text Interpretation: Atrial fibrillation Right bundle branch block Confirmed by Kristine Royal [phone]) on 08/07/2022 9:32:00 AM  Radiology US Abdomen Limited RUQ (LIVER/GB)  Result Date: 08/07/2022 CLINICAL DATA:  [id number] Abdominal pain [id number] EXAM: ULTRASOUND ABDOMEN LIMITED RIGHT UPPER QUADRANT COMPARISON:  CT 08/07/2022 FINDINGS: Gallbladder: No gallstones or wall thickening visualized. No sonographic Murphy sign noted by sonographer. Common bile duct: Diameter: 2 mm. Liver: No focal lesion identified. Mildly increased hepatic parenchymal echogenicity. Portal vein is patent on color Doppler imaging with normal direction of blood flow towards the liver. Other: None. IMPRESSION: 1. Unremarkable sonographic appearance of the gallbladder. 2. The echogenicity of the liver is mildly increased. This is a nonspecific finding but is most commonly seen with fatty infiltration of the liver. 3. No focal liver lesion identified sonographically to correspond to the CT findings. Electronically Signed   By: Duanne Guess D.O.   On: 08/07/2022 12:45   CT ABDOMEN PELVIS W CONTRAST  Result Date: 08/07/2022 CLINICAL DATA:  Abdominal pain, acute. EXAM: CT ABDOMEN AND PELVIS WITH CONTRAST TECHNIQUE: Multidetector CT imaging of the abdomen and pelvis was performed using the standard protocol following bolus administration of intravenous contrast. RADIATION DOSE REDUCTION: This exam was performed according to the departmental dose-optimization program which includes automated exposure control, adjustment of the mA and/or kV according to patient size and/or use of iterative reconstruction technique.  CONTRAST:  75mL OMNIPAQUE IOHEXOL 350 MG/ML SOLN COMPARISON:  None Available. FINDINGS: Lower chest: Artifact from ICD wires identified within the heart. Bilateral pleural effusions, right greater than left. Asymmetric scratch ground-glass attenuation and interlobular septal thickening identified within  the lung bases. Hepatobiliary: Low attenuation structure along the lateral dome of liver measures 0.9 by 0.7 cm and is too small to reliably characterize. Focal enhancing lesion within the lateral segment of left hepatic lobe measures 1.3 cm and is favored to represent either a hemangioma or benign vascular abnormality. Gallbladder appears normal. Small stone or polyp noted within the gallbladder measuring 4 mm. Mild low-density thickening of the gallbladder wall appears asymmetric measuring up to 5 mm. No surrounding fat stranding. No evidence for bile duct dilatation. Pancreas: Unremarkable. No pancreatic ductal dilatation or surrounding inflammatory changes. Spleen: The spleen appears normal in size. Three focal enhancing lesions within the spleen are noted which measure up to 5 mm, image 35/3. Adrenals/Urinary Tract: Normal adrenal glands. No suspicious kidney mass, nephrolithiasis, or hydronephrosis. The urinary bladder is unremarkable. Stomach/Bowel: Stomach is normal. The appendix is visualized and is within normal limits. No pathologic dilatation of the bowel loops to suggest obstruction. Sigmoid diverticulosis is identified without signs of acute diverticulitis. Vascular/Lymphatic: Aortic atherosclerosis. No signs of abdominopelvic adenopathy. Reproductive: Prostate gland enlargement with mass effect upon the base of bladder. Other: No free fluid or fluid collections. Musculoskeletal: No acute or suspicious osseous findings. L5-S1 degenerative disc disease. IMPRESSION: 1. Bilateral pleural effusions, right greater than left. Ground-glass attenuation and interlobular septal thickening identified within the  lung bases. Correlate for any clinical signs or symptoms of CHF. 2. Small stone or polyp within the gallbladder measuring 4 mm. Mild low-density thickening of the gallbladder wall appears asymmetric measuring up to 5 mm. No surrounding fat stranding. If there is a clinical concern for acute cholecystitis consider further evaluation with right upper quadrant ultrasound. 3. Three focal enhancing lesions within the spleen are noted which measure up to 5 mm. These are nonspecific and may represent small hemangiomas or benign vascular lesions. 4. Prostate gland enlargement with mass effect upon the base of bladder. 5.  Aortic Atherosclerosis (ICD10-I70.0). Electronically Signed   By: Signa Kell M.D.   On: 08/07/2022 10:42   DG Chest 2 View  Result Date: 08/07/2022 CLINICAL DATA:  Chest pain EXAM: CHEST - 2 VIEW COMPARISON:  12/11/2010 FINDINGS: Mild cardiac enlargement. Left chest wall ICD noted with lead in the right ventricle. Small bilateral pleural effusions. No frank interstitial edema or airspace consolidation. Visualized osseous structures are unremarkable. IMPRESSION: Mild cardiac enlargement and small bilateral pleural effusions. Correlate for any clinical signs/symptoms of early CHF. Electronically Signed   By: Signa Kell M.D.   On: 08/07/2022 08:20    Procedures Procedures    Medications Ordered in ED Medications  iohexol (OMNIPAQUE) 350 MG/ML injection 75 mL (75 mLs Intravenous Contrast Given 08/07/22 1031)  furosemide (LASIX) injection 40 mg (40 mg Intravenous Given 08/07/22 1307)    ED Course/ Medical Decision Making/ A&P Clinical Course as of 08/07/22 1435  Sun Aug 07, 2022  1058 CT ABDOMEN PELVIS W CONTRAST [PM]    Clinical Course User Index [PM] Wynetta Fines, MD                             Medical Decision Making Amount and/or Complexity of Data Reviewed Labs: ordered. Radiology: ordered. Decision-making details documented in ED Course.  Risk Prescription drug  management. Decision regarding hospitalization.   This patient presents to the ED with chief complaint(s) of shortness of breath, abdominal pain with pertinent past medical history of HFrEF, cardiomyopathy, hyperlipidemia, Afib anticoagulated with Eliquis.  The complaint involves  an extensive differential diagnosis and also carries with it a high risk of complications and morbidity.    The differential diagnosis includes acute on chronic CHF, ACS, PE, pneumonia, pneumothorax, metabolic derangement   The initial plan is to obtain labs, chest x-ray  Additional history obtained: Records reviewed  records from [facility], patient was admitted at the end of May for similar symptoms and was diuresed.  Patient transition to Bumex at home for diuresis.  Reviewed patient's medication list, which shows that he is anticoagulated with Eliquis.  Initial Assessment:   Exam significant for ill-appearing tachypneic patient who is not in respiratory distress.  Patient does have to take a breath every 3-4 words.  Skin is warm and dry, no cyanosis.  Lung sounds with significant rales on the right side and in the left lower base.  Adequate tidal volume.  SpO2 in the mid 90s.  Heart rate is 100-110 bpm with A-fib on the monitor.  Patient feels mildly better when he sits upright.  Patient does experience increased dyspnea with moving around the bed.  Independent ECG/labs interpretation:  The following labs were independently interpreted:  CBC with anemia, hemoglobin at 9.1, which is down from 10.6 from 3 weeks prior.  Metabolic panel with elevated Cr, somewhat higher than his baseline.  BNP elevated.  Initial trop 48, repeat 45 (delta -3).  Hepatic function with elevated bilirubin and AST.    Independent visualization and interpretation of imaging: I independently visualized the following imaging with scope of interpretation limited to determining acute life threatening conditions related to emergency care: Chest x-ray,  which revealed enlargement of the cardiac silhouette and bilateral pleural effusions.  CT abdomen pelvis with contrast was ordered to investigate abdominal pain.  There is a moderate right side pleural effusion and small left-sided pleural effusion.  There is some asymmetry around the gallbladder, will investigate with RUQ ultrasound as patient does have tenderness in this region.  Ultrasound was obtained and gallbladder is unremarkable.   Treatment and Reassessment: Patient's SpO2 dropped to 89% at 1 point, patient was placed on nasal cannula with a low flow rate.  He continues to complain of shortness of breath.  Will give patient IV Lasix to help with diuresis since he does have pleural effusions on CT.  Consultations obtained:   I requested consultation with on-call hospitalist provider and spoke with IM resident, Celine Mans, who agreed to come see patient.  Disposition:   Patient to be admitted to [facility] for acute CHF exacerbation.         Final Clinical Impression(s) / ED Diagnoses Final diagnoses:  Shortness of breath  HFrEF (heart failure with reduced ejection fraction) (HCC)  Bilateral pleural effusion    Rx / DC Orders ED Discharge Orders     None         Lenard Simmer, PA-C 08/07/22 1522    Wynetta Fines, MD 08/07/22 [id number]

## 2022-08-07 NOTE — H&P (Cosign Needed Addendum)
Hospital Admission History and Physical Service Pager: [phone]  Patient name: Austin [name] Medical record number: [id number] Date of Birth: 1956/01/28 Age: 67 y.o. Gender: male  Primary Care Provider: [name], NP Consultants: None Code Status: Full which was confirmed with patient Preferred Emergency Contact:  Contact Information     Name Relation Home Work [place] Sister [phone]  [phone]      Chief Complaint: shortness of breath  Assessment and Plan: Austin [name] is a 67 y.o. male presenting with dyspnea on exertion. Differential for presentation of this includes acute heart failure exacerbation, anemia, ACS, pneumonia, PE, COPD, asthma.  Less likely due to acute heart failure exacerbation given pleural effusion seen on imaging.  ACS workup reassuringly negative.  Anemia may be contributing giving new decline to 9.1 hemoglobin.  No obvious infectious symptoms making pneumonia less likely.  Patient is tachycardic (A-fib), patient has been taking Eliquis regularly making PE unlikely.  Patient does not have history of COPD or asthma. Hospital Problem List      Hospital     * (Principal) Acute exacerbation of CHF (congestive heart failure)  (HCC)     Significant cardiac history.  Last EF February 2024 for 15 to 20%.  S/p  ICD.  Follows with [facility] cardiology.  Prior similar admission in March of  this year.  Vital signs stable, appears well-perfused on exam.  No obvious  cause of exacerbation of based on history.  Consider change in EF versus  Bumex resistance. -Admit to family medicine teaching service, med/tele, attending Dr. Manson Passey -Monitor vitals per floor -Respiratory support as needed, currently requiring 3 L nasal cannula -Continuous pulse ox, cardiac monitoring -80 mg IV Lasix -ECHO -Strict I's and O's -Daily weights -Continue home Entresto, digoxin, Coreg, Imdur, Jardiance -Consult cardiology in a.m. -Lactic acid         Anemia     May be contributing to dyspnea.  Hemoglobin 9.1.  Per Care Everywhere  no recent iron studies.  Reassuringly, no obvious signs of bleeding on  exam or history.  Patient is taking Eliquis as below. -Anemia panel -AM CBC        Atrial fibrillation (HCC)     Heart rate controlled 100s to 110s on exam today.  Currently in A-fib. -Continue home Coreg -Continue [facility] Problem List      Hospital     * (Principal) Acute exacerbation of CHF (congestive heart failure)  (HCC)     Significant cardiac history.  Last EF February 2024 for 15 to 20%.  S/p  ICD.  Follows with [facility] cardiology.  Prior similar admission in March of  this year.  Vital signs stable, appears well-perfused on exam.  No obvious  cause of exacerbation of based on history.  Consider change in EF versus  Bumex resistance. -Admit to family medicine teaching service, med/tele, attending Dr. Manson Passey -Monitor vitals per floor -Respiratory support as needed, currently requiring 3 L nasal cannula -Continuous pulse ox, cardiac monitoring -80 mg IV Lasix -ECHO -Strict I's and O's -Daily weights -Continue home Entresto, digoxin, Coreg, Imdur, Jardiance -Consult cardiology in a.m. -Lactic acid        Anemia     May be contributing to dyspnea.  Hemoglobin 9.1.  Per Care Everywhere  no recent iron studies.  Reassuringly, no obvious signs of bleeding on  exam or history.  Patient is taking Eliquis as below. -Anemia panel -AM CBC  Atrial fibrillation (HCC)     Heart rate controlled 100s to 110s on exam today.  Currently in A-fib. -Continue home Coreg -Continue Eliquis     Chronic stable conditions CKD 3-creatinine 1.4, stable BPH-continue Flomax, finasteride Hypertension-elevated, monitor with restarting home cardiac medications HLD-continue pravastatin  FEN/GI: Heart healthy diet VTE Prophylaxis: Eliquis  Disposition: Med/tele  History of Present Illness:  Austin [name] is a 67  y.o. male presenting with dyspnea.  Notes he has been having issues where he can't catch his breath, abdominal pain, and last night he had left chest pain which radiated to his shoulder and left arm. Further endorses left neck pain. That neck pain eventually resolved. This all started approximately 2pm yesterday. He got home yesterday after work and couldn't eat because of his abdominal pain. He tried to lay down on the couch and couldn't get comfortable. He went to bed at 8pm. He was unable to sleep and notes these symptoms got worse and worse. Nyquil was not helpful. He worked some of his shift outside yesterday and the heat was wearing him out.  Notes he felt this way 3 months ago and was diagnosed with community-acquired pneumonia.  Endorses cough, vomited twice last night which looked like phlegm. Denies diarrhea, dysuria. He has been unable to take his medications today. Notes his abdominal pain has been going on for about 1 month. Not correlated to his eating habits. States the abdominal pain is worse when he gets coughing fits.  Endorses hx of 2 heart attacks. He does not know if he feels the same as his previous heart attacks. He does not have any stents placed. He has been very particular about his fluid intake.   In the ED, patient presented with SOB, abdominal pain.  Patient was tachycardic, tachypneic, hypertensive.  Placed on 3 L oxygen nasal cannula for work of breathing, saturating at 100%.  BNP was elevated to 872, troponin 48> 45.  Chest x-ray showed bilateral pleural effusions.  CT abdomen pelvis reassuring for no acute abdominal process.  Patient was given 1 dose 40 mg Lasix.  Review Of Systems: Per HPI  Pertinent Past Medical History: Atrial fibrillation HFrEF status post ICD Nonischemic cardiomyopathy CKD 3 HLD BPH Hypertension  Remainder reviewed in history tab.   Pertinent Past Surgical History: Hand surgery  Defibrillator placement?? Remainder reviewed in history  tab.  Pertinent Social History: Tobacco use: Former, quit Oct 1987 Alcohol use: rarely, quit Oct 1987 Other Substance use: marijuana seldom (once in a while when watching football) Lives alone  Pertinent Family History: Mother: breast cancer Father: Bladder cancer Brother: MI Brother (2): MI  Remainder reviewed in history tab.   Important Outpatient Medications: Tylenol Albuterol Eliquis Aspirin Bumex Coreg Jardiance Fenofibrate Finasteride Melatonin Omeprazole MiraLAX Pravastatin Entresto Spironolactone Flomax Remainder reviewed in medication history.   Objective: BP (!) 149/125 (BP Location: Left Arm)   Pulse (!) 107   Temp 98.2 F (36.8 C) (Oral)   Resp (!) 25   Ht 6\' 4"  (1.93 m)   Wt 100.7 kg   SpO2 97%   BMI 27.02 kg/m  Exam: General: A&O, NAD, lying comfortably in hospital bed HEENT: No sign of trauma, EOM grossly intact, moist mucous membranes Cardiac: Irregular rate and rhythm, no murmurs appreciated Respiratory:  labored breaths with conversation on 3 L nasal cannula, no retractions, crackles of bilateral lung bases  GI: Soft, mildly tender to palpation diffusely, non-distended, no rebound or guarding Extremities: NTTP, no peripheral edema. Neuro: Moves all  four extremities appropriately. Psych: Appropriate mood and affect   Labs:  CBC BMET  Recent Labs  Lab 08/07/22 0719  WBC 8.2  HGB 9.1*  HCT 31.5*  PLT 231   Recent Labs  Lab 08/07/22 0719  NA 140  K 3.7  CL 108  CO2 20*  BUN 19  CREATININE 1.46*  GLUCOSE 164*  CALCIUM 8.9     Troponin 48>45 BNP: 872  EKG: Atrial fibrillation, no acute ST changes  Imaging Studies Performed: CXR: Mild cardiac enlargement and small bilateral pleural effusions. CT a/p: Bilateral pleural effusions, right greater than left. Ground-glass attenuation and interlobular septal thickening identified within the lung bases. Small stone or polyp within the gallbladder measuring 4 mm. Mild low-density  thickening of the gallbladder wall appears asymmetric measuring up to 5 mm. No surrounding fat stranding. Three focal enhancing lesions within the spleen are noted which measure up to 5 mm. These are nonspecific and may represent small hemangiomas or benign vascular lesions. Prostate gland enlargement with mass effect upon the base of bladder. RUQ U/S: GB unremarkable. Echogenicity of the liver is mildly increased. No focal liver lesion.   Celine Mans, MD 08/07/2022, 2:25 PM PGY-1, [facility] Health Family Medicine  FPTS Intern pager: [phone], text pages welcome Secure chat group [facility] Teaching Service   I was personally present and performed or re-performed the history, physical exam and medical decision making activities of this service and have verified that the service and findings are accurately documented in the resident's note.  Shelby Mattocks, DO                  08/07/2022, 4:56 PM

## 2022-08-07 NOTE — Assessment & Plan Note (Signed)
May be contributing to dyspnea.  Hemoglobin 9.1.  Per Care Everywhere no recent iron studies.  Reassuringly, no obvious signs of bleeding on exam or history.  Patient is taking Eliquis as below. -Anemia panel -AM CBC

## 2022-08-07 NOTE — Assessment & Plan Note (Signed)
Heart rate controlled 100s to 110s on exam today.  Currently in A-fib. -Continue home Coreg -Continue Eliquis

## 2022-08-07 NOTE — Progress Notes (Signed)
FMTS Interim Progress Note  S: Breathing better. No concerns. Going for CTA PE.   O: BP 109/79 (BP Location: Left Arm)   Pulse (!) 107   Temp 98.3 F (36.8 C) (Oral)   Resp (!) 25   Ht 6\' 4"  (1.93 m)   Wt 100.7 kg   SpO2 97%   BMI 27.02 kg/m   General: Alert and oriented, in NAD Cardiac: Tachycardic, normal rhythm, no m/r/g appreciated Respiratory: CTAB throughout, breathing and speaking comfortably with [facility]: Appropriate mood and affect   A/P: Dyspnea likely due to CHF exacerbation VSS. Breathing and speaking well with me on 3L [place]. History of pleural effusions though mild on my CXR read. Repeat echo ordered, last EF 15-20%. Trending LA, recent 2.9. S/p lasix. Continuing home meds. Likely consult cards in AM. Going for CTA PE to r/o cause for dyspnea.  Atrial fibrillation HR consistently in 100s. Continuing home coreg and eliquis. Can consider spot metoprolol prn if rates sustained above 110s.  Remainder per day team note.  Janeal Holmes, MD 08/07/2022, 8:26 PM PGY-1, [facility] Family Medicine Service pager [phone]

## 2022-08-07 NOTE — Assessment & Plan Note (Addendum)
Significant cardiac history.  Last EF February 2020 for 15 to 20%.  S/p ICD.  Follows with [facility] cardiology.  Prior similar admission in March of this year.  Vital signs stable, appears well-perfused on exam.  No obvious cause of exacerbation of based on history.  Consider change in EF versus Bumex resistance. -Admit to family medicine teaching service, med/tele, attending Dr. Manson Passey -Monitor vitals per floor -Respiratory support as needed, currently requiring 3 L nasal cannula -Continuous pulse ox, cardiac monitoring -80 mg IV Lasix -ECHO -Strict I's and O's -Daily weights -Continue home Entresto, digoxin, Coreg, Imdur, Jardiance -Consult cardiology in a.m. -Lactic acid

## 2022-08-07 NOTE — Assessment & Plan Note (Signed)
Significant cardiac history.  Last EF February 2024 for 15 to 20%.  S/p ICD.  Follows with [facility] cardiology.  Prior similar admission in March of this year.  Vital signs stable, appears well-perfused on exam.  No obvious cause of exacerbation of based on history.  Consider change in EF versus Bumex resistance. -Admit to family medicine teaching service, med/tele, attending Dr. Manson Passey -Monitor vitals per floor -Respiratory support as needed, currently requiring 3 L nasal cannula -Continuous pulse ox, cardiac monitoring -80 mg IV Lasix -ECHO -Strict I's and O's -Daily weights -Continue home Entresto, digoxin, Coreg, Imdur, Jardiance -Consult cardiology in a.m. -Lactic acid

## 2022-08-07 NOTE — ED Notes (Signed)
Blue top drawn and sent to main lab

## 2022-08-07 NOTE — ED Triage Notes (Signed)
Pt. Stated, Ive had chest pain, arm, neck and face pain. Ive also had some stomach issue feeling like somebody is pulling my insides out. Im SOB all the time. I have a defibrillator. This started about 3 weeks ago.

## 2022-08-08 ENCOUNTER — Observation Stay (HOSPITAL_COMMUNITY): Payer: Medicare PPO

## 2022-08-08 DIAGNOSIS — I4819 Other persistent atrial fibrillation: Secondary | ICD-10-CM | POA: Diagnosis not present

## 2022-08-08 DIAGNOSIS — I428 Other cardiomyopathies: Secondary | ICD-10-CM

## 2022-08-08 DIAGNOSIS — R0609 Other forms of dyspnea: Secondary | ICD-10-CM

## 2022-08-08 DIAGNOSIS — R7989 Other specified abnormal findings of blood chemistry: Secondary | ICD-10-CM | POA: Insufficient documentation

## 2022-08-08 DIAGNOSIS — I5043 Acute on chronic combined systolic (congestive) and diastolic (congestive) heart failure: Secondary | ICD-10-CM | POA: Diagnosis not present

## 2022-08-08 DIAGNOSIS — I5020 Unspecified systolic (congestive) heart failure: Secondary | ICD-10-CM

## 2022-08-08 DIAGNOSIS — I5023 Acute on chronic systolic (congestive) heart failure: Secondary | ICD-10-CM | POA: Diagnosis not present

## 2022-08-08 LAB — LACTIC ACID, PLASMA: Lactic Acid, Venous: 1.5 mmol/L (ref 0.5–1.9)

## 2022-08-08 LAB — CBC
HCT: 31.1 % — ABNORMAL LOW (ref 39.0–52.0)
Hemoglobin: 9.1 g/dL — ABNORMAL LOW (ref 13.0–17.0)
MCH: 23 pg — ABNORMAL LOW (ref 26.0–34.0)
MCHC: 29.3 g/dL — ABNORMAL LOW (ref 30.0–36.0)
MCV: 78.7 fL — ABNORMAL LOW (ref 80.0–100.0)
Platelets: 198 10*3/uL (ref 150–400)
RBC: 3.95 MIL/uL — ABNORMAL LOW (ref 4.22–5.81)
RDW: 17.1 % — ABNORMAL HIGH (ref 11.5–15.5)
WBC: 7.9 10*3/uL (ref 4.0–10.5)
nRBC: 0 % (ref 0.0–0.2)

## 2022-08-08 LAB — ECHOCARDIOGRAM COMPLETE
Area-P 1/2: 4.8 cm2
Est EF: <20
Height: 76 in
MV M vel: 3.84 m/s
MV Peak grad: 59 mmHg
S' Lateral: 7 cm
Weight: 3287.5 oz

## 2022-08-08 LAB — IRON AND TIBC
Iron: 8 ug/dL — ABNORMAL LOW (ref 45–182)
Saturation Ratios: 2 % — ABNORMAL LOW (ref 17.9–39.5)
TIBC: 368 ug/dL (ref 250–450)
UIBC: 360 ug/dL

## 2022-08-08 LAB — RETICULOCYTES
Immature Retic Fract: 25.4 % — ABNORMAL HIGH (ref 2.3–15.9)
RBC.: 3.9 MIL/uL — ABNORMAL LOW (ref 4.22–5.81)
Retic Count, Absolute: 74.9 10*3/uL (ref 19.0–186.0)
Retic Ct Pct: 1.9 % (ref 0.4–3.1)

## 2022-08-08 LAB — COMPREHENSIVE METABOLIC PANEL
ALT: 46 U/L — ABNORMAL HIGH (ref 0–44)
AST: 39 U/L (ref 15–41)
Albumin: 3.3 g/dL — ABNORMAL LOW (ref 3.5–5.0)
Alkaline Phosphatase: 62 U/L (ref 38–126)
Anion gap: 10 (ref 5–15)
BUN: 19 mg/dL (ref 8–23)
CO2: 24 mmol/L (ref 22–32)
Calcium: 8.5 mg/dL — ABNORMAL LOW (ref 8.9–10.3)
Chloride: 106 mmol/L (ref 98–111)
Creatinine, Ser: 1.45 mg/dL — ABNORMAL HIGH (ref 0.61–1.24)
GFR, Estimated: 52 mL/min — ABNORMAL LOW (ref >60)
Glucose, Bld: 127 mg/dL — ABNORMAL HIGH (ref 70–99)
Potassium: 3.4 mmol/L — ABNORMAL LOW (ref 3.5–5.1)
Sodium: 140 mmol/L (ref 135–145)
Total Bilirubin: 2.9 mg/dL — ABNORMAL HIGH (ref 0.3–1.2)
Total Protein: 5.3 g/dL — ABNORMAL LOW (ref 6.5–8.1)

## 2022-08-08 LAB — MAGNESIUM: Magnesium: 2.1 mg/dL (ref 1.7–2.4)

## 2022-08-08 LAB — FERRITIN: Ferritin: 13 ng/mL — ABNORMAL LOW (ref 24–336)

## 2022-08-08 LAB — VITAMIN B12: Vitamin B-12: 151 pg/mL — ABNORMAL LOW (ref 180–914)

## 2022-08-08 LAB — DIGOXIN LEVEL: Digoxin Level: 0.9 ng/mL (ref 0.8–2.0)

## 2022-08-08 LAB — FOLATE: Folate: 7.9 ng/mL (ref >5.9)

## 2022-08-08 MED ORDER — SODIUM CHLORIDE 0.9 % IV SOLN
250.0000 mg | Freq: Once | INTRAVENOUS | Status: AC
  Administered 2022-08-08: 250 mg via INTRAVENOUS
  Filled 2022-08-08: qty 20

## 2022-08-08 MED ORDER — VITAMIN B-12 1000 MCG PO TABS
1000.0000 ug | ORAL_TABLET | Freq: Every day | ORAL | Status: DC
  Administered 2022-08-08 – 2022-08-12 (×5): 1000 ug via ORAL
  Filled 2022-08-08 (×5): qty 1

## 2022-08-08 MED ORDER — PERFLUTREN LIPID MICROSPHERE
1.0000 mL | INTRAVENOUS | Status: AC | PRN
  Administered 2022-08-08: 4 mL via INTRAVENOUS

## 2022-08-08 MED ORDER — POTASSIUM CHLORIDE CRYS ER 20 MEQ PO TBCR: 40.0000 meq | EXTENDED_RELEASE_TABLET | Freq: Once | ORAL | Status: DC

## 2022-08-08 MED ORDER — FUROSEMIDE 10 MG/ML IJ SOLN
80.0000 mg | Freq: Once | INTRAMUSCULAR | Status: DC
  Filled 2022-08-08: qty 8

## 2022-08-08 MED ORDER — POTASSIUM CHLORIDE CRYS ER 20 MEQ PO TBCR
40.0000 meq | EXTENDED_RELEASE_TABLET | Freq: Two times a day (BID) | ORAL | Status: AC
  Administered 2022-08-08 (×2): 40 meq via ORAL
  Filled 2022-08-08 (×2): qty 2

## 2022-08-08 NOTE — TOC Initial Note (Signed)
Transition of Care [facility]) - Initial/Assessment Note    Patient Details  Name: Austin [name] MRN: [id number] Date of Birth: 03-30-1955  [facility] of [facility]) CM/SW Contact:    Harriet Masson, RN Phone Number: 08/08/2022, 2:43 PM  Clinical Narrative:                 Spoke to patient regarding transition needs.  Patient lives alone and drives himself. Patient's car is at the hospital.  PCP confirmed. Currently on room air.  TOC following.  Expected Discharge Plan: Home/Self Care Barriers to Discharge: Continued Medical Work up   Patient Goals and CMS Choice Patient states their goals for this hospitalization and ongoing recovery are:: return home          Expected Discharge Plan and Services       Living arrangements for the past 2 months: Apartment                                      Prior Living Arrangements/Services Living arrangements for the past 2 months: Apartment Lives with:: Self Patient language and need for interpreter reviewed:: Yes Do you feel safe going back to the place where you live?: Yes      Need for Family Participation in Patient Care: Yes (Comment) Care giver support system in place?: Yes (comment)   Criminal Activity/Legal Involvement Pertinent to Current Situation/Hospitalization: No - Comment as needed  Activities of Daily Living Home Assistive Devices/Equipment: None ADL Screening (condition at time of admission) Patient's cognitive ability adequate to safely complete daily activities?: Yes Is the patient deaf or have difficulty hearing?: Yes Does the patient have difficulty seeing, even when wearing glasses/contacts?: No Does the patient have difficulty concentrating, remembering, or making decisions?: No Patient able to express need for assistance with ADLs?: No Does the patient have difficulty dressing or bathing?: Yes Independently performs ADLs?: No Communication: Independent Feeding: Independent Bathing: Needs  assistance Toileting: Needs assistance In/Out Bed: Needs assistance Does the patient have difficulty walking or climbing stairs?: Yes Weakness of Legs: None Weakness of Arms/Hands: None  Permission Sought/Granted                  Emotional Assessment Appearance:: Appears stated age Attitude/Demeanor/Rapport: Gracious, Engaged Affect (typically observed): Accepting Orientation: : Oriented to Self, Oriented to Place, Oriented to  Time, Oriented to Situation Alcohol / Substance Use: Not Applicable Psych Involvement: No (comment)  Admission diagnosis:  Shortness of breath [R06.02] Acute exacerbation of CHF (congestive heart failure) (HCC) [I50.9] Bilateral pleural effusion [J90] HFrEF (heart failure with reduced ejection fraction) (HCC) [I50.20] Patient Active Problem List   Diagnosis Date Noted   Elevated serum creatinine 08/08/2022   HFrEF (heart failure with reduced ejection fraction) (HCC) 08/08/2022   Acute exacerbation of CHF (congestive heart failure) (HCC) 08/07/2022   Anemia 08/07/2022   Atrial fibrillation (HCC) 08/07/2022   Essential hypertension 03/28/2012   Chronic systolic heart failure (HCC) 12/17/2010   PCP:  [name], NP Pharmacy:   [facility] [address], [place] - [address] [place] [address] Phone: [phone] Fax: [phone]  [facility] - [address] [place], [place] - [address] [place] [address] Phone: [phone] Fax: [phone]     Social Determinants of Health (SDOH) Social History: SDOH Screenings   Food Insecurity: No Food Insecurity (08/07/2022)  Housing: Low  Risk  (08/07/2022)  Transportation Needs: No Transportation Needs (08/07/2022)  Utilities: Not At Risk (08/07/2022)  Tobacco Use: Medium Risk (08/07/2022)   SDOH Interventions:     Readmission Risk Interventions     No data to display

## 2022-08-08 NOTE — Progress Notes (Addendum)
Daily Progress Note Intern Pager: [phone]  Patient name: Austin [name] Medical record number: [id number] Date of birth: [birth year] Age: 67 y.o. Gender: male  Primary Care Provider: [name], NP Consultants: Cardiology Code Status: Full  Pt Overview and Major Events to Date:  -Admitted 08/08/22  Assessment and Plan:  Austin [name] is a 67 y.o. male admitted for acute on chronic HFrEF exacerbation. Pertinent PMH/PSH includes HTN, CHF s/p ICD, HLD, CKD3.   Hospital Problem List      Hospital     * (Principal) Acute exacerbation of CHF (congestive heart failure)  (HCC)     I/O -4.13L. Now on RA.  Respiratory status improved.  Last EF February  2024 for 15 to 20%.  S/p ICD.  Follows with [facility] cardiology. LA  initially elevated, now resolved. -Monitor vitals per floor -Respiratory support as needed -Continuous pulse ox, cardiac monitoring -80 mg IV Lasix repeat -ECHO -Strict I's and O's -Daily weights -Continue digoxin, Coreg, Imdur, hold Entresto, Jardiance given kidney  function -Consult cardiology, recs appreciated        Anemia     Labs consistent with iron deficiency anemia. Patient is taking Eliquis  as below. Hgb stable 9.1. -AM CBC -Replete Iron, B12 -Outpatient GI evaluation        Atrial fibrillation (HCC)     Heart rate controlled 100s to 110s on exam today.  Currently in A-fib. -Continue home Coreg -Continue Eliquis        Elevated serum creatinine     Cr 1.46>1.45. CKD Stage 3. Per Care Everywhere, baseline 1-1.20. Likely  prerenal in the setting of cardiorenal syndrome, expect improvement with  diuresis.        HFrEF (heart failure with reduced ejection fraction) (HCC)    FEN/GI: Heart healthy diet PPx: Eliquis Dispo: Pending cardiology evaluation  Subjective:  No acute events overnight.  States he is feeling a lot better.  Breathing is doing well.  No chest pain.  Feels heart rate is controlled.  Objective: Temp:   [97.8 F (36.6 C)-98.5 F (36.9 C)] 98.5 F (36.9 C) (06/24 0745) Pulse Rate:  [59-110] 89 (06/24 0745) Resp:  [15-25] 16 (06/24 0745) BP: (90-149)/(65-125) 90/65 (06/24 0745) SpO2:  [93 %-100 %] 94 % (06/24 0745) Weight:  [93.2 kg] 93.2 kg (06/24 1478) Physical Exam: General: NAD, comfortable in hospital bed Cardiovascular: RRR, no murmurs, no peripheral edema Respiratory: normal WOB on RA, CTAB, no wheezes, ronchi or rales Abdomen: soft, NTTP, no rebound or guarding Extremities: Moving all 4 extremities equally   Laboratory: Most recent CBC Lab Results  Component Value Date   WBC 7.9 08/08/2022   HGB 9.1 (L) 08/08/2022   HCT 31.1 (L) 08/08/2022   MCV 78.7 (L) 08/08/2022   PLT 198 08/08/2022   Most recent BMP    Latest Ref Rng & Units 08/08/2022   12:16 AM  BMP  Glucose 70 - 99 mg/dL 295   BUN 8 - 23 mg/dL 19   Creatinine 6.21 - 1.24 mg/dL 3.08   Sodium 657 - 846 mmol/L 140   Potassium 3.5 - 5.1 mmol/L 3.4   Chloride 98 - 111 mmol/L 106   CO2 22 - 32 mmol/L 24   Calcium 8.9 - 10.3 mg/dL 8.5     N62 - 952 Iron - 8 SR - 2 Ferritin 13  Imaging/Diagnostic Tests: No new imaging.  Celine Mans, MD 08/08/2022, 11:53 AM  PGY-1, [facility] Health Family Medicine FPTS Intern pager:  [phone], text pages welcome Secure chat group [place]

## 2022-08-08 NOTE — Assessment & Plan Note (Signed)
Cr 1.46>1.45. CKD Stage 3. Per Care Everywhere, baseline 1-1.20. Likely prerenal in the setting of cardiorenal syndrome, expect improvement with diuresis.

## 2022-08-08 NOTE — Consult Note (Addendum)
Cardiology Consultation   Patient ID: Austin [name] MRN: [id number]; DOB: May 01, 1955  Admit date: 08/07/2022 Date of Consult: 08/08/2022  PCP:  [name], NP   [facility] Providers Cardiologist:  Dr. Mellody Drown on [facility]  Electrophysiologist: Dr. Erich Montane II of [facility]  Patient Profile:   Austin [name] is a 67 y.o. male with a hx of hypertension, hyperlipidemia, nonobstructive CAD on cath 12/10/2010, CKD stage III, PAF, NICM s/p single chamber Biotronik ICD 09/21/2020 and chronic systolic CHF with baseline EF 08-65% who is being seen 08/08/2022 for the evaluation of dyspnea at the request of Dr. Manson Passey.  History of Present Illness:   Austin [name] is a 67 yo male with past medical history of hypertension, hyperlipidemia, nonobstructive CAD on cath 12/10/2010, CKD stage III, PAF, NICM s/p single chamber Biotronik ICD 09/21/2020 and chronic systolic CHF with baseline EF 78-46%.  Patient was initially admitted to [facility] in October 2012 with new onset of systolic heart failure.  He was seen by heart failure service.  Left and right heart catheterization performed at the time showed 20% distal left main, luminal irregularities in the LAD and left circumflex artery, cardiac output 5.2, cardiac index 2.2.  EF was 15-20% at the time.  Ejection fraction later improved to 30 to 35% in January 2015.  He was readmitted to the hospital in November 2015 with chest pain and dyspnea.  Cardiac catheterization performed at [facility] on 01/02/2014 demonstrated 20% ostial OM disease, no angiographic evidence of coronary artery disease in left main, LAD and RCA.  EF remained low at 30 to 35% in 2018.  He initially declined ICD.  EF dropped further down to 25 to 30% on echocardiogram in 2022.  ARB was switched to [facility].  He was also on carvedilol.  Patient was subsequently referred to heart failure service at [facility].  He underwent ICD implantation in August  2022.  Last echocardiogram obtained on 03/25/2022 showed EF 15 to 20%, severe diffuse hypokinesis, mild to moderate MR, normal-sized RV.  More recently, patient was admitted and [facility] on 05/14/2022 was heart failure exacerbation.  He underwent IV diuresis and transition to p.o. Bumex 2 mg twice a day.  His EKG at [facility] suggested he was still in sinus rhythm at the time.  He was last seen in heart failure clinic on 07/13/2022 at which time he remained very independent.  Systolic blood pressure was in the 90s, however patient was asymptomatic.  Patient was seen at [facility] ED on 07/14/2022 for nonbilious emesis and abdominal discomfort.  He reported at the time that he has misplaced his Aldactone and was taking reduced dose of Entresto.  ED course complicated by A-fib with RVR.  Heart failure service was consulted, it was felt that RVR was in the setting of acute GI illness.  CT of abdomen pelvis suggested possible gastroenteritis with small to moderate bilateral effusion.  Heart rate settled down without intervention.  He was discharged to follow-up with heart failure service as outpatient.   His discharge heart failure regimen included carvedilol 25 mg twice daily, Entresto 97-1 3 mg twice daily, spironolactone 25 mg daily, Jardiance 10 mg daily and Bumex 2 mg twice a day.  According to the patient, he continued to have diffuse abdominal discomfort for the past several months.  He also has a radiating pain from the abdomen to the left chest.  He noticed the symptoms more so when he exerted himself.  He  described dyspnea on exertion that is worsened in the recent months as well.  He never had any lower extremity edema before.  He has been compliant with his heart failure medication at home.  He has been nauseated and had poor appetite every time he has abdominal discomfort, however abdominal pain is not postprandial.  He eventually sought medical attention at [facility] on 08/08/2022.  He vomited  twice yesterday before coming to the emergency room.  At this time, he continued to have diffuse abdominal tenderness.  Work on arrival showed a creatinine of 1.46, baseline creatinine around 1.2.  BNP 872.  Serial troponin 48--> 45.  AST 52.  Hemoglobin 9.1.  Baseline hemoglobin 10-11.  Chest x-ray showed mild cardiomegaly with bilateral pleural effusion.  CT abdomen showed bilateral pleural effusion, right greater than left, groundglass attenuation and the interlobar septal thickening which may represent CHF, small stone or polyp in the gallbladder, 3 focal enhancing lesions in the spleen which may represent small hemangiomas, prostate gland enlargement with mass effect upon the base of the bladder.  Abdominal ultrasound was unremarkable.  CTA of the chest was negative for PE, cardiomegaly, small bilateral effusion, right greater than left.  Repeat echocardiogram showed EF less than 20%, global hypokinesis, severe LV dysfunction, echodensity noted on device lead in the RV cannot exclude vegetation, moderately reduced RVEF, mild MR, mild dilatation of the ascending aorta measuring 40 mm.  Cardiology service consulted for heart failure.  Overnight, patient has been treated with 120 mg of IV Lasix, he peed out 4 L of urine.  Breathing has improved.  Abdominal discomfort persist upon palpation.   Past Medical History:  Diagnosis Date   Benign prostatic hypertrophy    Hyperlipidemia    Nonischemic cardiomyopathy [facility]) Oct 2012   cath 12/10/10 minimal nonobstructive coronary artery disease   Systolic heart failure Oct 2012   echo 12/08/10 EF 20%, severe left ventricular enlargement, mild right ventricular enlargement    Past Surgical History:  Procedure Laterality Date   HAND SURGERY  1979   left hand machine trauma     Home Medications:  Prior to Admission medications   Medication Sig Start Date End Date Taking? Authorizing Provider  albuterol (VENTOLIN HFA) 108 (90 Base) MCG/ACT inhaler Inhale  1-2 puffs into the lungs every 6 (six) hours as needed. 05/03/22 05/03/23 Yes [provider]  apixaban (ELIQUIS) 5 MG TABS tablet Take 5 mg by mouth 2 (two) times daily. 05/25/22  Yes [provider]  aspirin 81 MG tablet Take 81 mg by mouth daily.     Yes [provider]  benzonatate (TESSALON) 100 MG capsule Take 100 mg by mouth every 8 (eight) hours. 02/24/22  Yes [provider]  bumetanide (BUMEX) 2 MG tablet Take 2 mg by mouth 2 (two) times daily. 06/29/22  Yes [provider]  carvedilol (COREG) 12.5 MG tablet Take 12.5 mg by mouth 2 (two) times daily with a meal. 06/03/11  Yes Clegg, Amy D, NP  Cholecalciferol 50 MCG (2000 UT) TABS Take 1 tablet by mouth daily. 06/11/19  Yes [provider]  digoxin (LANOXIN) 0.25 MG tablet Take 0.25 mg by mouth daily. 12/30/12  Yes [provider]  ENTRESTO 24-26 MG Take 1 tablet by mouth 2 (two) times daily. 07/15/22  Yes [provider]  fenofibrate micronized (LOFIBRA) 67 MG capsule Take 67 mg by mouth every morning. 05/25/22  Yes [provider]  ferrous sulfate 325 (65 FE) MG tablet Take  325 mg by mouth. 05/29/19  Yes [provider]  finasteride (PROSCAR) 5 MG tablet Take 1 tablet by mouth daily. 05/08/19  Yes [provider]  hydrocortisone 2.5 % cream Apply 1 Application topically. 12/30/12  Yes [provider]  isosorbide mononitrate (IMDUR) 30 MG 24 hr tablet Take 1 tablet by mouth daily. 04/19/19  Yes [provider]  JARDIANCE 10 MG TABS tablet Take 10 mg by mouth daily. 03/28/22  Yes [provider]  ketoconazole (NIZORAL) 2 % shampoo Apply 1 Application topically 3 (three) times a week. 12/30/12  Yes [provider]  loratadine (CLARITIN) 10 MG tablet Take 10 mg by mouth daily. 12/30/12  Yes [provider]  melatonin 3 MG TABS tablet Take 3 mg by mouth at bedtime. 02/03/22  Yes [provider]   omeprazole (PRILOSEC) 20 MG capsule Take 20 mg by mouth daily.   Yes [provider]  pravastatin (PRAVACHOL) 40 MG tablet Take 1 tablet (40 mg total) by mouth daily. 01/24/11 08/07/22 Yes Bensimhon, Bevelyn Buckles, MD  Tamsulosin HCl (FLOMAX) 0.4 MG CAPS Take 0.4 mg by mouth daily after supper.   Yes [provider]  ciprofloxacin (CIPRO) 500 MG tablet Take 500 mg by mouth 2 (two) times daily. Patient not taking: Reported on 08/07/2022 05/30/19   [provider]  furosemide (LASIX) 20 MG tablet Take 20 mg by mouth daily. Patient not taking: Reported on 08/07/2022 05/30/19   [provider]  spironolactone (ALDACTONE) 25 MG tablet Take 1 tablet (25 mg total) by mouth daily. Patient not taking: Reported on 08/07/2022 03/31/11   Sherald Hess, NP    Inpatient Medications: Scheduled Meds:  apixaban  5 mg Oral BID   carvedilol  12.5 mg Oral BID WC   vitamin B-12  1,000 mcg Oral Daily   digoxin  0.25 mg Oral Daily   finasteride  5 mg Oral Daily   furosemide  80 mg Intravenous Once   isosorbide mononitrate  30 mg Oral Daily   melatonin  3 mg Oral QHS   pantoprazole  40 mg Oral Daily   potassium chloride  40 mEq Oral BID   pravastatin  40 mg Oral Daily   tamsulosin  0.4 mg Oral QPC supper   Continuous Infusions:  ferric gluconate (FERRLECIT) IVPB     PRN Meds: albuterol, perflutren lipid microspheres (DEFINITY) IV suspension  Allergies:   No Known Allergies  Social History:   Social History   Socioeconomic History   Marital status: Single    Spouse name: Not on file   Number of children: Not on file   Years of education: Not on file   Highest education level: Not on file  Occupational History   Not on file  Tobacco Use   Smoking status: Former    Packs/day: 2.00    Years: 14.00    Additional pack years: 0.00    Total pack years: 28.00    Types: Cigarettes    Quit date: 02/14/1993    Years since quitting: 29.4   Smokeless tobacco: Not on file   Substance and Sexual Activity   Alcohol use: Yes    Comment: occasionally, once a month   Drug use: Not on file   Sexual activity: Not on file  Other Topics Concern   Not on file  Social History Narrative   He is single and lives in [place].  He drives a refrigerator truck.   Social Determinants of Health   Financial  Resource Strain: Not on file  Food Insecurity: No Food Insecurity (08/07/2022)   Hunger Vital Sign    Worried About Running Out of Food in the Last Year: Never true    Ran Out of Food in the Last Year: Never true  Transportation Needs: No Transportation Needs (08/07/2022)   PRAPARE - Administrator, Civil Service (Medical): No    Lack of Transportation (Non-Medical): No  Physical Activity: Not on file  Stress: Not on file  Social Connections: Not on file  Intimate Partner Violence: Not At Risk (08/07/2022)   Humiliation, Afraid, Rape, and Kick questionnaire    Fear of Current or Ex-Partner: No    Emotionally Abused: No    Physically Abused: No    Sexually Abused: No    Family History:    Family History  Problem Relation Age of Onset   Breast cancer Mother    Cancer Father    Heart attack Brother    Heart attack Brother 42     ROS:  Please see the history of present illness.   All other ROS reviewed and negative.     Physical Exam/Data:   Vitals:   08/07/22 2331 08/08/22 0330 08/08/22 0614 08/08/22 0745  BP: 97/68 92/68 104/74 90/65  Pulse:    89  Resp:  15 20 16   Temp: 98 F (36.7 C) 98.4 F (36.9 C) 97.8 F (36.6 C) 98.5 F (36.9 C)  TempSrc: Oral Oral Oral Oral  SpO2:  95% 93% 94%  Weight:   93.2 kg   Height:        Intake/Output Summary (Last 24 hours) at 08/08/2022 1211 Last data filed at 08/08/2022 0331 Gross per 24 hour  Intake 120 ml  Output 4250 ml  Net -4130 ml      08/08/2022    6:14 AM 08/07/2022    7:13 AM 03/28/2012   11:47 AM  Last 3 Weights  Weight (lbs) 205 lb 7.5 oz 222 lb 248 lb 8 oz  Weight (kg)  93.2 kg 100.699 kg 112.719 kg     Body mass index is 25.01 kg/m.  General:  Well nourished, well developed, in no acute distress HEENT: normal Neck: no JVD Vascular: No carotid bruits; Distal pulses 2+ bilaterally Cardiac: irregular; no murmur  Lungs:  clear to auscultation bilaterally, no wheezing, rhonchi or rales  Abd: soft, nontender, no hepatomegaly  Ext: no edema Musculoskeletal:  No deformities, BUE and BLE strength normal and equal Skin: warm and dry  Neuro:  CNs 2-12 intact, no focal abnormalities noted Psych:  Normal affect   EKG:  The EKG was personally reviewed and demonstrates: Atrial fibrillation, right bundle branch block Telemetry:  Telemetry was personally reviewed and demonstrates: Atrial fibrillation, no significant ventricular ectopy.  Relevant CV Studies:   Echo 08/08/2022  1. Left ventricular ejection fraction, by estimation, is <20%. The left  ventricle has severely decreased function. The left ventricle demonstrates  global hypokinesis. The left ventricular internal cavity size was severely  dilated. Left ventricular  diastolic parameters are indeterminate.   2. Echodensity noted on device lead in RV. Cannot exclude vegetation.  Right ventricular systolic function is moderately reduced. The right  ventricular size is moderately enlarged. There is normal pulmonary artery  systolic pressure.   3. Left atrial size was mildly dilated.   4. Right atrial size was moderately dilated.   5. The mitral valve is normal in structure. Mild mitral valve  regurgitation. No evidence of mitral stenosis.  6. The aortic valve is tricuspid. Aortic valve regurgitation is not  visualized. No aortic stenosis is present.   7. Aortic dilatation noted. There is mild dilatation of the ascending  aorta, measuring 40 mm.   8. The inferior vena cava is dilated in size with >50% respiratory  variability, suggesting right atrial pressure of 8 mmHg.    Conclusion(s)/Recommendation(s): No left ventricular mural or apical  thrombus/thrombi. Severely reduced LVEF, <20% with severe dilation of LV  chamber. Echodensity noted on device lead in RV. Cannot exclude  vegetation. Findings communicated to ordering  team.   Laboratory Data:  High Sensitivity Troponin:   Recent Labs  Lab 08/07/22 0719 08/07/22 0912  TROPONINIHS 48* 45*     Chemistry Recent Labs  Lab 08/07/22 0719 08/08/22 0016  NA 140 140  K 3.7 3.4*  CL 108 106  CO2 20* 24  GLUCOSE 164* 127*  BUN 19 19  CREATININE 1.46* 1.45*  CALCIUM 8.9 8.5*  MG  --  2.1  GFRNONAA 52* 52*  ANIONGAP 12 10    Recent Labs  Lab 08/07/22 0912 08/08/22 0016  PROT 5.5* 5.3*  ALBUMIN 3.4* 3.3*  AST 52* 39  ALT 37 46*  ALKPHOS 60 62  BILITOT 2.7* 2.9*   Lipids No results for input(s): "CHOL", "TRIG", "HDL", "LABVLDL", "LDLCALC", "CHOLHDL" in the last 168 hours.  Hematology Recent Labs  Lab 08/07/22 0719 08/08/22 0016  WBC 8.2 7.9  RBC 4.02* 3.95*  3.90*  HGB 9.1* 9.1*  HCT 31.5* 31.1*  MCV 78.4* 78.7*  MCH 22.6* 23.0*  MCHC 28.9* 29.3*  RDW 17.3* 17.1*  PLT 231 198   Thyroid No results for input(s): "TSH", "FREET4" in the last 168 hours.  BNP Recent Labs  Lab 08/07/22 0719  BNP 872.8*    DDimer No results for input(s): "DDIMER" in the last 168 hours.   Radiology/Studies:  ECHOCARDIOGRAM COMPLETE  Result Date: 08/08/2022    ECHOCARDIOGRAM REPORT   Patient Name:   Austin [name] Date of Exam: 08/08/2022 Medical Rec #:  [id number]        Height:       76.0 in Accession #:    [id number]       Weight:       205.5 lb Date of Birth:  1955/08/11         BSA:          2.241 m Patient Age:    66 years         BP:           90/65 mmHg Patient Gender: M                HR:           86 bpm. Exam Location:  Inpatient Procedure: 2D Echo, 3D Echo, Cardiac Doppler, Color Doppler and Intracardiac            Opacification Agent Indications:    Dyspnea R06.00  History:         Patient has prior history of Echocardiogram examinations, most                 recent 08/08/2011. CHF and Cardiomyopathy, Pacemaker,                 Arrythmias:Atrial Fibrillation, Signs/Symptoms:Dyspnea; Risk                 Factors:Hypertension, Former Smoker and Dyslipidemia.  Sonographer:    Aron Baba Referring Phys: [id number] CARINA  M BROWN IMPRESSIONS  1. Left ventricular ejection fraction, by estimation, is <20%. The left ventricle has severely decreased function. The left ventricle demonstrates global hypokinesis. The left ventricular internal cavity size was severely dilated. Left ventricular diastolic parameters are indeterminate.  2. Echodensity noted on device lead in RV. Cannot exclude vegetation. Right ventricular systolic function is moderately reduced. The right ventricular size is moderately enlarged. There is normal pulmonary artery systolic pressure.  3. Left atrial size was mildly dilated.  4. Right atrial size was moderately dilated.  5. The mitral valve is normal in structure. Mild mitral valve regurgitation. No evidence of mitral stenosis.  6. The aortic valve is tricuspid. Aortic valve regurgitation is not visualized. No aortic stenosis is present.  7. Aortic dilatation noted. There is mild dilatation of the ascending aorta, measuring 40 mm.  8. The inferior vena cava is dilated in size with >50% respiratory variability, suggesting right atrial pressure of 8 mmHg. Conclusion(s)/Recommendation(s): No left ventricular mural or apical thrombus/thrombi. Severely reduced LVEF, <20% with severe dilation of LV chamber. Echodensity noted on device lead in RV. Cannot exclude vegetation. Findings communicated to ordering team. FINDINGS  Left Ventricle: Left ventricular ejection fraction, by estimation, is <20%. The left ventricle has severely decreased function. The left ventricle demonstrates global hypokinesis. Definity contrast agent was given IV to delineate the left ventricular endocardial  borders. The left ventricular internal cavity size was severely dilated. There is no left ventricular hypertrophy. Left ventricular diastolic parameters are indeterminate. Right Ventricle: Echodensity noted on device lead in RV. Cannot exclude vegetation. The right ventricular size is moderately enlarged. No increase in right ventricular wall thickness. Right ventricular systolic function is moderately reduced. There is normal pulmonary artery systolic pressure. The tricuspid regurgitant velocity is 2.19 m/s, and with an assumed right atrial pressure of 8 mmHg, the estimated right ventricular systolic pressure is 27.2 mmHg. Left Atrium: Left atrial size was mildly dilated. Right Atrium: Right atrial size was moderately dilated. Pericardium: There is no evidence of pericardial effusion. Mitral Valve: The mitral valve is normal in structure. Mild mitral valve regurgitation. No evidence of mitral valve stenosis. Tricuspid Valve: The tricuspid valve is normal in structure. Tricuspid valve regurgitation is mild . No evidence of tricuspid stenosis. Aortic Valve: The aortic valve is tricuspid. Aortic valve regurgitation is not visualized. No aortic stenosis is present. Pulmonic Valve: The pulmonic valve was not well visualized. Pulmonic valve regurgitation is trivial. No evidence of pulmonic stenosis. Aorta: Aortic dilatation noted. There is mild dilatation of the ascending aorta, measuring 40 mm. Venous: The inferior vena cava is dilated in size with greater than 50% respiratory variability, suggesting right atrial pressure of 8 mmHg. IAS/Shunts: The atrial septum is grossly normal. Additional Comments: A device lead is visualized in the right ventricle and right atrium.  LEFT VENTRICLE PLAX 2D LVIDd:         7.80 cm   Diastology LVIDs:         7.00 cm   LV e' medial:    7.29 cm/s LV PW:         1.00 cm   LV E/e' medial:  14.1 LV IVS:        0.70 cm   LV e' lateral:   8.81 cm/s LVOT diam:     2.30 cm   LV E/e' lateral:  11.7 LV SV:         40 LV SV Index:   18 LVOT Area:     4.15 cm  RIGHT VENTRICLE RV S prime:     7.46 cm/s TAPSE (M-mode): 1.1 cm LEFT ATRIUM             Index        RIGHT ATRIUM           Index LA diam:        4.10 cm 1.83 cm/m   RA Area:     27.10 cm LA Vol (A2C):   62.2 ml 27.76 ml/m  RA Volume:   91.00 ml  40.61 ml/m LA Vol (A4C):   62.2 ml 27.76 ml/m LA Biplane Vol: 64.1 ml 28.61 ml/m  AORTIC VALVE             PULMONIC VALVE LVOT Vmax:   68.00 cm/s  PR End Diast Vel: 6.45 msec LVOT Vmean:  43.200 cm/s LVOT VTI:    0.096 m  AORTA Ao Root diam: 3.80 cm Ao Asc diam:  4.00 cm MITRAL VALVE                TRICUSPID VALVE MV Area (PHT): 4.80 cm     TR Peak grad:   19.2 mmHg MV Decel Time: 158 msec     TR Vmax:        219.00 cm/s MR Peak grad: 59.0 mmHg MR Mean grad: 33.0 mmHg     SHUNTS MR Vmax:      384.00 cm/s   Systemic VTI:  0.10 m MR Vmean:     266.0 cm/s    Systemic Diam: 2.30 cm MR PISA:      2.26 cm MV E velocity: 103.00 cm/s MV A velocity: 45.70 cm/s MV E/A ratio:  2.25 Jodelle Red MD Electronically signed by Jodelle Red MD Signature Date/Time: 08/08/2022/11:28:07 AM    Final    CT Angio Chest Pulmonary Embolism (PE) W or WO Contrast  Result Date: 08/07/2022 CLINICAL DATA:  Pulmonary embolism suspected, chest pain EXAM: CT ANGIOGRAPHY CHEST WITH CONTRAST TECHNIQUE: Multidetector CT imaging of the chest was performed using the standard protocol during bolus administration of intravenous contrast. Multiplanar CT image reconstructions and MIPs were obtained to evaluate the vascular anatomy. RADIATION DOSE REDUCTION: This exam was performed according to the departmental dose-optimization program which includes automated exposure control, adjustment of the mA and/or kV according to patient size and/or use of iterative reconstruction technique. CONTRAST:  75mL OMNIPAQUE IOHEXOL 350 MG/ML SOLN COMPARISON:  08/07/2022, 12/08/2010 FINDINGS: Cardiovascular: This is a technically  adequate evaluation of the pulmonary vasculature. No filling defects or pulmonary emboli. The heart is enlarged, with prominent left ventricular dilatation. Single lead cardiac pacer seen within the right ventricular lumen. Normal caliber of the thoracic aorta. Atherosclerosis of the aorta and coronary vasculature. Mediastinum/Nodes: No enlarged mediastinal, hilar, or axillary lymph nodes. Thyroid gland, trachea, and esophagus demonstrate no significant findings. Lungs/Pleura: Small bilateral pleural effusions, right greater than left. No acute airspace disease or pneumothorax. The central airways are patent. Hypoventilatory changes are seen at the lung bases. Upper Abdomen: Pericholecystic fat stranding again noted, please see earlier CT and ultrasound abdomen reports. Remainder of the upper abdomen is unremarkable. Musculoskeletal: No acute or destructive bony abnormalities. Reconstructed images demonstrate no additional findings. Review of the MIP images confirms the above findings. IMPRESSION: 1. No evidence of pulmonary embolus. 2. Cardiomegaly, with prominent left ventricular dilatation. 3. Small bilateral pleural effusions, right greater than left. 4. Pericholecystic fat stranding partially visualized, unchanged since earlier CT. Please see previous CT and ultrasound abdomen reports. 5. Aortic Atherosclerosis (ICD10-I70.0).  Coronary artery atherosclerosis. Electronically Signed   By: Sharlet Salina M.D.   On: 08/07/2022 20:45   US Abdomen Limited RUQ (LIVER/GB)  Result Date: 08/07/2022 CLINICAL DATA:  [id number] Abdominal pain [id number] EXAM: ULTRASOUND ABDOMEN LIMITED RIGHT UPPER QUADRANT COMPARISON:  CT 08/07/2022 FINDINGS: Gallbladder: No gallstones or wall thickening visualized. No sonographic Murphy sign noted by sonographer. Common bile duct: Diameter: 2 mm. Liver: No focal lesion identified. Mildly increased hepatic parenchymal echogenicity. Portal vein is patent on color Doppler imaging with normal  direction of blood flow towards the liver. Other: None. IMPRESSION: 1. Unremarkable sonographic appearance of the gallbladder. 2. The echogenicity of the liver is mildly increased. This is a nonspecific finding but is most commonly seen with fatty infiltration of the liver. 3. No focal liver lesion identified sonographically to correspond to the CT findings. Electronically Signed   By: Duanne Guess D.O.   On: 08/07/2022 12:45   CT ABDOMEN PELVIS W CONTRAST  Result Date: 08/07/2022 CLINICAL DATA:  Abdominal pain, acute. EXAM: CT ABDOMEN AND PELVIS WITH CONTRAST TECHNIQUE: Multidetector CT imaging of the abdomen and pelvis was performed using the standard protocol following bolus administration of intravenous contrast. RADIATION DOSE REDUCTION: This exam was performed according to the departmental dose-optimization program which includes automated exposure control, adjustment of the mA and/or kV according to patient size and/or use of iterative reconstruction technique. CONTRAST:  75mL OMNIPAQUE IOHEXOL 350 MG/ML SOLN COMPARISON:  None Available. FINDINGS: Lower chest: Artifact from ICD wires identified within the heart. Bilateral pleural effusions, right greater than left. Asymmetric scratch ground-glass attenuation and interlobular septal thickening identified within the lung bases. Hepatobiliary: Low attenuation structure along the lateral dome of liver measures 0.9 by 0.7 cm and is too small to reliably characterize. Focal enhancing lesion within the lateral segment of left hepatic lobe measures 1.3 cm and is favored to represent either a hemangioma or benign vascular abnormality. Gallbladder appears normal. Small stone or polyp noted within the gallbladder measuring 4 mm. Mild low-density thickening of the gallbladder wall appears asymmetric measuring up to 5 mm. No surrounding fat stranding. No evidence for bile duct dilatation. Pancreas: Unremarkable. No pancreatic ductal dilatation or surrounding  inflammatory changes. Spleen: The spleen appears normal in size. Three focal enhancing lesions within the spleen are noted which measure up to 5 mm, image 35/3. Adrenals/Urinary Tract: Normal adrenal glands. No suspicious kidney mass, nephrolithiasis, or hydronephrosis. The urinary bladder is unremarkable. Stomach/Bowel: Stomach is normal. The appendix is visualized and is within normal limits. No pathologic dilatation of the bowel loops to suggest obstruction. Sigmoid diverticulosis is identified without signs of acute diverticulitis. Vascular/Lymphatic: Aortic atherosclerosis. No signs of abdominopelvic adenopathy. Reproductive: Prostate gland enlargement with mass effect upon the base of bladder. Other: No free fluid or fluid collections. Musculoskeletal: No acute or suspicious osseous findings. L5-S1 degenerative disc disease. IMPRESSION: 1. Bilateral pleural effusions, right greater than left. Ground-glass attenuation and interlobular septal thickening identified within the lung bases. Correlate for any clinical signs or symptoms of CHF. 2. Small stone or polyp within the gallbladder measuring 4 mm. Mild low-density thickening of the gallbladder wall appears asymmetric measuring up to 5 mm. No surrounding fat stranding. If there is a clinical concern for acute cholecystitis consider further evaluation with right upper quadrant ultrasound. 3. Three focal enhancing lesions within the spleen are noted which measure up to 5 mm. These are nonspecific and may represent small hemangiomas or benign vascular lesions. 4. Prostate gland enlargement with mass effect upon the base  of bladder. 5.  Aortic Atherosclerosis (ICD10-I70.0). Electronically Signed   By: Signa Kell M.D.   On: 08/07/2022 10:42   DG Chest 2 View  Result Date: 08/07/2022 CLINICAL DATA:  Chest pain EXAM: CHEST - 2 VIEW COMPARISON:  12/11/2010 FINDINGS: Mild cardiac enlargement. Left chest wall ICD noted with lead in the right ventricle. Small  bilateral pleural effusions. No frank interstitial edema or airspace consolidation. Visualized osseous structures are unremarkable. IMPRESSION: Mild cardiac enlargement and small bilateral pleural effusions. Correlate for any clinical signs/symptoms of early CHF. Electronically Signed   By: Signa Kell M.D.   On: 08/07/2022 08:20     Assessment and Plan:   Acute on chronic systolic heart failure: Patient underwent aggressive IV diuresis and was given a total of 120 mg IV Lasix yesterday.  He put out 4 L of urine.  He appears to be euvolemic on exam today.  EF remained low at less than 20% on echocardiogram with signs of biventricular failure.  -Home heart failure medication has been either held or reduced.  Carvedilol reduced to 12.5 mg twice a day.  Spironolactone, Jardiance and Entresto stopped.  Systolic blood pressure in the 90s.  Will discuss with MD, potentially restart Jardiance.   ?Mass on echo: will review with MD  Abdominal pain: Unclear cause.  Recently diagnosed with gastroenteritis and [facility] based on CT abdomen and pelvis.  CT abdomen pelvis repeated on 08/07/2022, no clear finding to explain diffuse abdominal pain.  Nonobstructive CAD: Previous cardiac catheterization in 2012 showed 20% distal left main disease, minor luminal irregularities in other vessels.  Last cardiac catheterization performed at [facility] in 2015 showed a 20% OM lesion, normal coronary arteries otherwise.  Complaining of occasional left-sided chest pain radiating from the abdomen up.  Serial troponin is borderline elevated however remained flat.  CKD stage III: Creatinine 1.4.  PAF: On Eliquis.  Likely persistent at this point.  On carvedilol and digoxin at home.  Unable to see an EKG at [facility], however EKG interpretation at [facility] suggested he was in sinus rhythm in March however was in A-fib with RVR during ED visit on 5/30.  He currently remains in atrial fibrillation and has no cardiac awareness.  Nonischemic  cardiomyopathy s/p single-chamber Biotronik ICD  Hypertension: Blood pressure in the 90s.  Home heart failure medication has been on hold  Hyperlipidemia: On pravastatin   Risk Assessment/Risk Scores:        New York Heart Association (NYHA) Functional Class NYHA Class II  CHA2DS2-VASc Score = 4   This indicates a 4.8% annual risk of stroke. The patient's score is based upon: CHF History: 1 HTN History: 1 Diabetes History: 0 Stroke History: 0 Vascular Disease History: 1 Age Score: 1 Gender Score: 0         For questions or updates, please contact [facility] Please consult [email or web address] for contact info under    Signed, Azalee Course, PA  08/08/2022 12:11 PM   I have seen and examined the patient along with Azalee Course, PA .  I have reviewed the chart, notes and new data.  I agree with PA/NP's note.  Key new complaints: presented with abdominal soreness, but also describes difficulty sleeping due to orthopnea. Both complaints have improved with 4 liter diuresis overnight.  BP is now 77-82/55-57, but he denies dizziness, just feels weak. Key examination changes: no signs of elevated RA pressure - no edema or JVD or hepatomegaly, clear lungs. Irregular rhythm.  Key new findings /  data: Reviewed the echo - severe spherical LV remodeling with EDD almost 8 cm, EF 15%, RV function relatively preserved, non-dilated IVC. No major valve abnormalities. I do not think there is a vegetation on the ICD lead - looks like a more mobile lead with lots of slack so the ultrasound plane sections across it more than once.  PLAN: Severe nonischemic CMP with acute decompensation accompanied by / possibly caused by persistent atrial fibrillation. Has a single lead ICD so cannot identify with precision when the AFib started. Check for dig toxicity due to prominence of GI complaints. Hold diuretics today for hypotension. Consider DCCV if arrhythmia persists and dig level is non-toxic. He is  followed in [facility] HF clinic. May need to enlist assistance from our advanced HF team if he is not progressing well during this admission.  Thurmon Fair, MD, [facility] [phone] 08/08/2022, 1:11 PM

## 2022-08-08 NOTE — Evaluation (Signed)
Physical Therapy Evaluation/ Discharge Patient Details Name: Austin [name] MRN: [id number] DOB: 09/24/1955 Today's Date: 08/08/2022  History of Present Illness  67 yo male admitted 6/23 with dyspnea and CHF exacerbation. PMhx: HTN, HLD, CAD, CKD, NICM, ICD, HFrEF 15-20%, BPH  Clinical Impression  Pt pleasant, talkative and sharing about his time in the [employer]. Pt lives alone, works part time and is generally active. Pt able to perform all mobility without assist and states weighing himself daily. Pt educated for improved control of sodium intake with diet but otherwise is managing well. No DOE with max HR 110. Pt without further therapy needs at this time and will sign off with pt aware and agreeable. Encouraged daily ambulation while admitted.        Recommendations for follow up therapy are one component of a multi-disciplinary discharge planning process, led by the attending physician.  Recommendations may be updated based on patient status, additional functional criteria and insurance authorization.  Follow Up Recommendations       Assistance Recommended at Discharge None  Patient can return home with the following       Equipment Recommendations None recommended by PT  Recommendations for Other Services       Functional Status Assessment Patient has not had a recent decline in their functional status     Precautions / Restrictions Precautions Precautions: None      Mobility  Bed Mobility Overal bed mobility: Independent                  Transfers Overall transfer level: Independent                      Ambulation/Gait Ambulation/Gait assistance: Independent Gait Distance (Feet): 800 Feet Assistive device: None Gait Pattern/deviations: WFL(Within Functional Limits)   Gait velocity interpretation: >2.62 ft/sec, indicative of community ambulatory   General Gait Details: slight sway with gait at times without need for physical assist to  maintain balance. HR max 110  Stairs            Wheelchair Mobility    Modified Rankin (Stroke Patients Only)       Balance Overall balance assessment: Mild deficits observed, not formally tested                                           Pertinent Vitals/Pain Pain Assessment Pain Assessment: No/denies pain    Home Living Family/patient expects to be discharged to:: Private residence Living Arrangements: Alone   Type of Home: Apartment Home Access: Level entry         Home Equipment: None      Prior Function Prior Level of Function : Independent/Modified Independent                     Hand Dominance        Extremity/Trunk Assessment   Upper Extremity Assessment Upper Extremity Assessment: Overall WFL for tasks assessed    Lower Extremity Assessment Lower Extremity Assessment: Overall WFL for tasks assessed    Cervical / Trunk Assessment Cervical / Trunk Assessment: Normal  Communication   Communication: No difficulties  Cognition Arousal/Alertness: Awake/alert Behavior During Therapy: WFL for tasks assessed/performed Overall Cognitive Status: Within Functional Limits for tasks assessed  General Comments      Exercises     Assessment/Plan    PT Assessment Patient does not need any further PT services  PT Problem List         PT Treatment Interventions      PT Goals (Current goals can be found in the Care Plan section)  Acute Rehab PT Goals PT Goal Formulation: All assessment and education complete, DC therapy    Frequency       Co-evaluation               AM-PAC PT "6 Clicks" Mobility  Outcome Measure Help needed turning from your back to your side while in a flat bed without using bedrails?: None Help needed moving from lying on your back to sitting on the side of a flat bed without using bedrails?: None Help needed moving to and from a  bed to a chair (including a wheelchair)?: None Help needed standing up from a chair using your arms (e.g., wheelchair or bedside chair)?: None Help needed to walk in hospital room?: None Help needed climbing 3-5 steps with a railing? : None 6 Click Score: 24    End of Session   Activity Tolerance: Patient tolerated treatment well Patient left: in chair;with call bell/phone within reach Nurse Communication: Mobility status PT Visit Diagnosis: Other abnormalities of gait and mobility (R26.89)    Time: 4098-1191 PT Time Calculation (min) (ACUTE ONLY): 24 min   Charges:   PT Evaluation $PT Eval Low Complexity: 1 Low          Tevyn Codd P, PT Acute Rehabilitation Services Office: [phone]   Enedina Finner Kelsie Zaborowski 08/08/2022, 1:45 PM

## 2022-08-08 NOTE — Progress Notes (Signed)
MEWS Progress Note  Patient Details Name: Austin [name] MRN: [id number] DOB: 07-07-1955 Today's Date: 08/08/2022   MEWS Flowsheet Documentation:  Assess: MEWS Score Temp: 98.5 F (36.9 C) BP: (!) 82/56 (asymptomatic, md aware) MAP (mmHg): 65 Pulse Rate: 89 ECG Heart Rate: 90 Resp: (!) 21 Level of Consciousness: Alert SpO2: 94 % O2 Device: Room Air Assess: MEWS Score MEWS Temp: 0 MEWS Systolic: 1 MEWS Pulse: 0 MEWS RR: 1 MEWS LOC: 0 MEWS Score: 2 MEWS Score Color: Yellow Assess: SIRS CRITERIA SIRS Temperature : 0 SIRS Respirations : 1 SIRS Pulse: 0 SIRS WBC: 0 SIRS Score Sum : 1 SIRS Temperature : 0 SIRS Pulse: 0 SIRS Respirations : 1 SIRS WBC: 0 SIRS Score Sum : 1 Assess: if the MEWS score is Yellow or Red Were vital signs taken at a resting state?: Yes Focused Assessment: No change from prior assessment Does the patient meet 2 or more of the SIRS criteria?: No MEWS guidelines implemented : Yes, yellow Treat MEWS Interventions: Considered administering scheduled or prn medications/treatments as ordered Take Vital Signs Increase Vital Sign Frequency : Yellow: Q2hr x1, continue Q4hrs until patient remains green for 12hrs Escalate MEWS: Escalate: Yellow: Discuss with charge nurse and consider notifying provider and/or RRT Notify: Charge Nurse/RN Name of Charge Nurse/RN Notified: creshenda Rn Provider Notification Provider Name/Title: dr. Royann Shivers Date Provider Notified: 08/08/22 Time Provider Notified: 1250 Method of Notification: Face-to-face Notification Reason: Other (Comment) (systolic bp-80's) Test performed and critical result: Lactic acid 2.9 Date Critical Result Received: 08/07/22 Time Critical Result Received: 7829 Provider response: At bedside, See new orders Date of Provider Response: 08/08/22 Time of Provider Response: [id number]      Arvella Merles I 08/08/2022, 12:51 PM

## 2022-08-08 NOTE — Assessment & Plan Note (Signed)
I/O -4.9L. Now on RA. Digoxin level wnl. Last EF February 2024 for 15 to 20%. ECHO demonstrating similar EF, possible ICD lead vegetation. Stomach pain likely due to volume overload, unable to further diuresis given Bps. -Strict I's and O's -Daily weights -Continue digoxin, Coreg, hold Entresto, Jardiance given kidney function/BP -Consult cardiology, recs appreciated  -Hold diuresis for BP  -Low suspicion for vegetation  -Tentative cardioversion tomorrow -Consider HF consult -Bentyl, Miralax

## 2022-08-08 NOTE — Care Management Obs Status (Signed)
MEDICARE OBSERVATION STATUS NOTIFICATION   Patient Details  Name: Austin [name] MRN: [id number] Date of Birth: February 05, 1956   Medicare Observation Status Notification Given:  Yes    Harriet Masson, RN 08/08/2022, 2:17 PM

## 2022-08-09 ENCOUNTER — Other Ambulatory Visit: Payer: Self-pay

## 2022-08-09 ENCOUNTER — Observation Stay (HOSPITAL_COMMUNITY): Payer: Medicare PPO

## 2022-08-09 DIAGNOSIS — I5023 Acute on chronic systolic (congestive) heart failure: Secondary | ICD-10-CM | POA: Diagnosis not present

## 2022-08-09 DIAGNOSIS — I428 Other cardiomyopathies: Secondary | ICD-10-CM | POA: Diagnosis not present

## 2022-08-09 DIAGNOSIS — D500 Iron deficiency anemia secondary to blood loss (chronic): Secondary | ICD-10-CM | POA: Diagnosis not present

## 2022-08-09 DIAGNOSIS — I4819 Other persistent atrial fibrillation: Secondary | ICD-10-CM | POA: Diagnosis not present

## 2022-08-09 LAB — BASIC METABOLIC PANEL
Anion gap: 7 (ref 5–15)
BUN: 17 mg/dL (ref 8–23)
CO2: 23 mmol/L (ref 22–32)
Calcium: 8.6 mg/dL — ABNORMAL LOW (ref 8.9–10.3)
Chloride: 108 mmol/L (ref 98–111)
Creatinine, Ser: 1.37 mg/dL — ABNORMAL HIGH (ref 0.61–1.24)
GFR, Estimated: 56 mL/min — ABNORMAL LOW (ref >60)
Glucose, Bld: 121 mg/dL — ABNORMAL HIGH (ref 70–99)
Potassium: 3.6 mmol/L (ref 3.5–5.1)
Sodium: 138 mmol/L (ref 135–145)

## 2022-08-09 LAB — CBC
HCT: 30 % — ABNORMAL LOW (ref 39.0–52.0)
Hemoglobin: 8.9 g/dL — ABNORMAL LOW (ref 13.0–17.0)
MCH: 22.8 pg — ABNORMAL LOW (ref 26.0–34.0)
MCHC: 29.7 g/dL — ABNORMAL LOW (ref 30.0–36.0)
MCV: 76.9 fL — ABNORMAL LOW (ref 80.0–100.0)
Platelets: 203 10*3/uL (ref 150–400)
RBC: 3.9 MIL/uL — ABNORMAL LOW (ref 4.22–5.81)
RDW: 17 % — ABNORMAL HIGH (ref 11.5–15.5)
WBC: 8.3 10*3/uL (ref 4.0–10.5)
nRBC: 0 % (ref 0.0–0.2)

## 2022-08-09 LAB — MAGNESIUM: Magnesium: 2.1 mg/dL (ref 1.7–2.4)

## 2022-08-09 MED ORDER — POTASSIUM CHLORIDE CRYS ER 20 MEQ PO TBCR: 40.0000 meq | EXTENDED_RELEASE_TABLET | ORAL | Status: DC

## 2022-08-09 MED ORDER — AMIODARONE HCL IN DEXTROSE 360-4.14 MG/200ML-% IV SOLN
60.0000 mg/h | INTRAVENOUS | Status: DC
  Administered 2022-08-09: 30 mg/h via INTRAVENOUS
  Administered 2022-08-10 (×2): 60 mg/h via INTRAVENOUS
  Administered 2022-08-10: 30 mg/h via INTRAVENOUS
  Administered 2022-08-11 – 2022-08-12 (×5): 60 mg/h via INTRAVENOUS
  Filled 2022-08-09 (×9): qty 200

## 2022-08-09 MED ORDER — DIGOXIN 125 MCG PO TABS
0.1250 mg | ORAL_TABLET | Freq: Every day | ORAL | Status: DC
  Administered 2022-08-09 – 2022-08-12 (×4): 0.125 mg via ORAL
  Filled 2022-08-09 (×4): qty 1

## 2022-08-09 MED ORDER — SODIUM CHLORIDE 0.9 % IV SOLN
250.0000 mg | Freq: Once | INTRAVENOUS | Status: AC
  Administered 2022-08-09: 250 mg via INTRAVENOUS
  Filled 2022-08-09: qty 20

## 2022-08-09 MED ORDER — DICYCLOMINE HCL 10 MG PO CAPS
10.0000 mg | ORAL_CAPSULE | Freq: Two times a day (BID) | ORAL | Status: DC
  Administered 2022-08-09 – 2022-08-12 (×5): 10 mg via ORAL
  Filled 2022-08-09 (×9): qty 1

## 2022-08-09 MED ORDER — DAPAGLIFLOZIN PROPANEDIOL 10 MG PO TABS: 10.0000 mg | ORAL_TABLET | Freq: Every day | ORAL | Status: DC

## 2022-08-09 MED ORDER — POTASSIUM CHLORIDE CRYS ER 20 MEQ PO TBCR
40.0000 meq | EXTENDED_RELEASE_TABLET | ORAL | Status: AC
  Administered 2022-08-09 (×2): 40 meq via ORAL
  Filled 2022-08-09 (×2): qty 2

## 2022-08-09 MED ORDER — AMIODARONE HCL IN DEXTROSE 360-4.14 MG/200ML-% IV SOLN
60.0000 mg/h | INTRAVENOUS | Status: DC
  Administered 2022-08-09: 60 mg/h via INTRAVENOUS
  Filled 2022-08-09: qty 200

## 2022-08-09 MED ORDER — CARVEDILOL 3.125 MG PO TABS
3.1250 mg | ORAL_TABLET | Freq: Two times a day (BID) | ORAL | Status: DC
  Filled 2022-08-09: qty 1

## 2022-08-09 MED ORDER — SODIUM CHLORIDE 0.9% FLUSH
10.0000 mL | Freq: Two times a day (BID) | INTRAVENOUS | Status: DC
  Administered 2022-08-09 – 2022-08-11 (×3): 10 mL

## 2022-08-09 MED ORDER — EMPAGLIFLOZIN 10 MG PO TABS
10.0000 mg | ORAL_TABLET | Freq: Every day | ORAL | Status: DC
  Administered 2022-08-09 – 2022-08-12 (×4): 10 mg via ORAL
  Filled 2022-08-09 (×4): qty 1

## 2022-08-09 MED ORDER — SODIUM CHLORIDE 0.9% FLUSH: 10.0000 mL | INTRAVENOUS | Status: DC | PRN

## 2022-08-09 MED ORDER — CHLORHEXIDINE GLUCONATE CLOTH 2 % EX PADS
6.0000 | MEDICATED_PAD | Freq: Every day | CUTANEOUS | Status: DC
  Administered 2022-08-10 – 2022-08-12 (×3): 6 via TOPICAL

## 2022-08-09 MED ORDER — POLYETHYLENE GLYCOL 3350 17 G PO PACK
17.0000 g | PACK | Freq: Every day | ORAL | Status: DC
  Administered 2022-08-09 – 2022-08-12 (×4): 17 g via ORAL
  Filled 2022-08-09 (×4): qty 1

## 2022-08-09 NOTE — Consult Note (Addendum)
Advanced Heart Failure Team Consult Note   Primary Physician: [name], NP PCP-Cardiologist:  Dr [name]  Reason for Consultation: A/C HFrEF  HPI:    Austin [name] is seen today for evaluation of A/C HFrEF at the request of dr Croitoru.  Austin [name] is 67 year  old with a history of chronic HFrEF, HTN, HLD, nonobstructive CAD 2012, CKD Stage III, PAF, and single chamber Biotronik .   EF initially down 2012 to 20%. EF improved in 2013 to 50-55%.   2015 cath- LCX 20% OMI, otherwise ok.  Echo 2022 EF 25-30% RV normal.  Echo 03/2022 EF 15-20% RV normal.   Last noted to be in SR in March 2024.   Admitted to [facility] Jan, Feb, March, April, and May with A/C HFrEF. He tells me he would feel ok for a few days then start feeling bad again. He has difficulty paying for medications. Says he was confused about his medications.   He decided he wanted to come back to [facility] to re establish with Dr [name] and Heart Failure Team at [facility]. He has been having trouble paying for medications. He has not missed doses of eliquis. Lives alone and is able to drive to appoints. Works part time a Health visitor.    Presented to [facility] on Sunday with increased shortness of breath and abdominal discomfort. Admitted with A/C HFrEF + A fib. EKG A fib 93 bpm.  Lactic acid 2.9>1.9, hs trop  48>45, bnP 872, creatinine 1.46, and Hgb 9.1. CTA - no PE. CT abd/pelvis bilateral pleural effusion R> L. Abd [place] ok. CXR with small pleural effusions.  Echo this admit EF < 20% RV normal. Diuresing with IV lasix. Given IV Iron.   Remains SOB with exertion.   Review of Systems: [y] = yes, [ ]  = no   General: Weight gain [ ] ; Weight loss [ ] ; Anorexia [ ] ; Fatigue [ ] ; Fever [ ] ; Chills [ ] ; Weakness [ Y]  Cardiac: Chest pain/pressure [ ] ; Resting SOB [ ] ; Exertional SOB [ Y]; Orthopnea [Y ]; Pedal Edema [ ] ; Palpitations [ ] ; Syncope [ ] ; Presyncope [ ] ; Paroxysmal nocturnal dyspnea[ ]   Pulmonary:  Cough Gilian.Kraft ]; Wheezing[ ] ; Hemoptysis[ ] ; Sputum [ ] ; Snoring [ ]   GI: Vomiting[ ] ; Dysphagia[ ] ; Melena[ ] ; Hematochezia [ ] ; Heartburn[ ] ; Abdominal pain [ Y]; Constipation [ ] ; Diarrhea [ ] ; BRBPR [ ]   GU: Hematuria[ ] ; Dysuria [ ] ; Nocturia[ ]   Vascular: Pain in legs with walking [ ] ; Pain in feet with lying flat [ ] ; Non-healing sores [ ] ; Stroke [ ] ; TIA [ ] ; Slurred speech [ ] ;  Neuro: Headaches[ ] ; Vertigo[ ] ; Seizures[ ] ; Paresthesias[ ] ;Blurred vision [ ] ; Diplopia [ ] ; Vision changes [ ]   Ortho/Skin: Arthritis [ ] ; Joint pain [Y ]; Muscle pain [ ] ; Joint swelling [ ] ; Back Pain [ ] ; Rash [ ]   Psych: Depression[ ] ; Anxiety[ ]   Heme: Bleeding problems [ ] ; Clotting disorders [ ] ; Anemia [ ]   Endocrine: Diabetes [ ] ; Thyroid dysfunction[ ]   Home Medications Prior to Admission medications   Medication Sig Start Date End Date Taking? Authorizing Provider  albuterol (VENTOLIN HFA) 108 (90 Base) MCG/ACT inhaler Inhale 1-2 puffs into the lungs every 6 (six) hours as needed. 05/03/22 05/03/23 Yes [provider]  apixaban (ELIQUIS) 5 MG TABS tablet Take 5 mg by mouth 2 (two) times daily. 05/25/22  Yes [provider]  aspirin 81 MG tablet Take 81 mg by mouth daily.     Yes [provider]  benzonatate (TESSALON) 100 MG capsule Take 100 mg by mouth every 8 (eight) hours. 02/24/22  Yes [provider]  bumetanide (BUMEX) 2 MG tablet Take 2 mg by mouth 2 (two) times daily. 06/29/22  Yes [provider]  carvedilol (COREG) 12.5 MG tablet Take 12.5 mg by mouth 2 (two) times daily with a meal. 06/03/11  Yes Clegg, Amy D, NP  Cholecalciferol 50 MCG (2000 UT) TABS Take 1 tablet by mouth daily. 06/11/19  Yes [provider]  digoxin (LANOXIN) 0.25 MG tablet Take 0.25 mg by mouth daily. 12/30/12  Yes [provider]  ENTRESTO 24-26 MG Take 1 tablet by mouth 2 (two) times daily. 07/15/22  Yes [provider]  fenofibrate micronized (LOFIBRA)  67 MG capsule Take 67 mg by mouth every morning. 05/25/22  Yes [provider]  ferrous sulfate 325 (65 FE) MG tablet Take 325 mg by mouth. 05/29/19  Yes [provider]  finasteride (PROSCAR) 5 MG tablet Take 1 tablet by mouth daily. 05/08/19  Yes [provider]  hydrocortisone 2.5 % cream Apply 1 Application topically. 12/30/12  Yes [provider]  isosorbide mononitrate (IMDUR) 30 MG 24 hr tablet Take 1 tablet by mouth daily. 04/19/19  Yes [provider]  JARDIANCE 10 MG TABS tablet Take 10 mg by mouth daily. 03/28/22  Yes [provider]  ketoconazole (NIZORAL) 2 % shampoo Apply 1 Application topically 3 (three) times a week. 12/30/12  Yes [provider]  loratadine (CLARITIN) 10 MG tablet Take 10 mg by mouth daily. 12/30/12  Yes [provider]  melatonin 3 MG TABS tablet Take 3 mg by mouth at bedtime. 02/03/22  Yes [provider]  omeprazole (PRILOSEC) 20 MG capsule Take 20 mg by mouth daily.   Yes [provider]  pravastatin (PRAVACHOL) 40 MG tablet Take 1 tablet (40 mg total) by mouth daily. 01/24/11 08/07/22 Yes Bensimhon, Bevelyn Buckles, MD  Tamsulosin HCl (FLOMAX) 0.4 MG CAPS Take 0.4 mg by mouth daily after supper.   Yes [provider]  ciprofloxacin (CIPRO) 500 MG tablet Take 500 mg by mouth 2 (two) times daily. Patient not taking: Reported on 08/07/2022 05/30/19   [provider]  furosemide (LASIX) 20 MG tablet Take 20 mg by mouth daily. Patient not taking: Reported on 08/07/2022 05/30/19   [provider]  spironolactone (ALDACTONE) 25 MG tablet Take 1 tablet (25 mg total) by mouth daily. Patient not taking: Reported on 08/07/2022 03/31/11   Tonye Becket D, NP    Past Medical History: Past Medical History:  Diagnosis Date   Benign prostatic hypertrophy    Hyperlipidemia    Nonischemic cardiomyopathy [place]) Oct 2012   cath 12/10/10 minimal nonobstructive coronary artery  disease   Systolic heart failure Oct 2012   echo 12/08/10 EF 20%, severe left ventricular enlargement, mild right ventricular enlargement    Past Surgical History: Past Surgical History:  Procedure Laterality Date   HAND SURGERY  1979   left hand machine trauma    Family History: Family History  Problem Relation Age of Onset   Breast cancer Mother    Cancer Father    Heart attack Brother    Heart attack Brother 14    Social History: Social History   Socioeconomic History   Marital status: Single    Spouse name: Not on file   Number of  children: Not on file   Years of education: Not on file   Highest education level: Not on file  Occupational History   Not on file  Tobacco Use   Smoking status: Former    Packs/day: 2.00    Years: 14.00    Additional pack years: 0.00    Total pack years: 28.00    Types: Cigarettes    Quit date: 02/14/1993    Years since quitting: 29.5   Smokeless tobacco: Not on file  Substance and Sexual Activity   Alcohol use: Yes    Comment: occasionally, once a month   Drug use: Not on file   Sexual activity: Not on file  Other Topics Concern   Not on file  Social History Narrative   He is single and lives in [place].  He drives a refrigerator truck.   Social Determinants of Health   Financial Resource Strain: Not on file  Food Insecurity: No Food Insecurity (08/07/2022)   Hunger Vital Sign    Worried About Running Out of Food in the Last Year: Never true    Ran Out of Food in the Last Year: Never true  Transportation Needs: No Transportation Needs (08/07/2022)   PRAPARE - Administrator, Civil Service (Medical): No    Lack of Transportation (Non-Medical): No  Physical Activity: Not on file  Stress: Not on file  Social Connections: Not on file    Allergies:  No Known Allergies  Objective:    Vital Signs:   Temp:  [97.6 F (36.4 C)-99 F (37.2 C)] 98.3 F (36.8 C) (06/25 1116) Pulse Rate:  [81-134] 81 (06/25  1116) Resp:  [13-20] 18 (06/25 1116) BP: (88-119)/(61-91) 95/75 (06/25 1116) SpO2:  [90 %-95 %] 92 % (06/25 1116) Weight:  [94.4 kg] 94.4 kg (06/25 0707) Last BM Date : 08/07/22  Weight change: Filed Weights   08/07/22 0713 08/08/22 0614 08/09/22 0707  Weight: 100.7 kg 93.2 kg 94.4 kg    Intake/Output:   Intake/Output Summary (Last 24 hours) at 08/09/2022 1257 Last data filed at 08/08/2022 2300 Gross per 24 hour  Intake 450 ml  Output 1225 ml  Net -775 ml      Physical Exam    General: In the chair.  No resp difficulty HEENT: normal Neck: supple. JVP 7-8  . Carotids 2+ bilat; no bruits. No lymphadenopathy or thyromegaly appreciated. Cor: PMI nondisplaced. Irregular rate & rhythm. No rubs, gallops or murmurs. Lungs: Coarse throughout on room air.  Abdomen: soft, nontender, nondistended. No hepatosplenomegaly. No bruits or masses. Good bowel sounds. Extremities: cool, no cyanosis, clubbing, rash, edema Neuro: alert & orientedx3, cranial nerves grossly intact. moves all 4 extremities w/o difficulty. Affect pleasant   Telemetry   A fib 90   EKG    A fib 93 bpm   Labs   Basic Metabolic Panel: Recent Labs  Lab 08/07/22 0719 08/08/22 0016 08/09/22 0006  NA 140 140 138  K 3.7 3.4* 3.6  CL 108 106 108  CO2 20* 24 23  GLUCOSE 164* 127* 121*  BUN 19 19 17   CREATININE 1.46* 1.45* 1.37*  CALCIUM 8.9 8.5* 8.6*  MG  --  2.1 2.1    Liver Function Tests: Recent Labs  Lab 08/07/22 0912 08/08/22 0016  AST 52* 39  ALT 37 46*  ALKPHOS 60 62  BILITOT 2.7* 2.9*  PROT 5.5* 5.3*  ALBUMIN 3.4* 3.3*   No results for input(s): "LIPASE", "AMYLASE" in the last 168 hours. No  results for input(s): "AMMONIA" in the last 168 hours.  CBC: Recent Labs  Lab 08/07/22 0719 08/08/22 0016 08/09/22 0006  WBC 8.2 7.9 8.3  HGB 9.1* 9.1* 8.9*  HCT 31.5* 31.1* 30.0*  MCV 78.4* 78.7* 76.9*  PLT 231 198 203    Cardiac Enzymes: No results for input(s): "CKTOTAL", "CKMB",  "CKMBINDEX", "TROPONINI" in the last 168 hours.  BNP: BNP (last 3 results) Recent Labs    08/07/22 0719  BNP 872.8*    ProBNP (last 3 results) No results for input(s): "PROBNP" in the last 8760 hours.   CBG: No results for input(s): "GLUCAP" in the last 168 hours.  Coagulation Studies: No results for input(s): "LABPROT", "INR" in the last 72 hours.   Imaging   No results found.   Medications:     Current Medications:  apixaban  5 mg Oral BID   carvedilol  12.5 mg Oral BID WC   vitamin B-12  1,000 mcg Oral Daily   dicyclomine  10 mg Oral BID WC   digoxin  0.125 mg Oral Daily   finasteride  5 mg Oral Daily   melatonin  3 mg Oral QHS   pantoprazole  40 mg Oral Daily   polyethylene glycol  17 g Oral Daily   pravastatin  40 mg Oral Daily   tamsulosin  0.4 mg Oral QPC supper    Infusions:  ferric gluconate (FERRLECIT) IVPB 250 mg (08/09/22 1153)      Patient Profile   Austin [name] is 67 year  old with a history of chronic HFrEF, HTN, HLD, nonobstructive CAD 2012, CKD Stage III, PAF, and single chamber Biotronik .   Admitted with A/C HFrEF and A fib.   Assessment/Plan   1. A/C HFrEF, NICM  Initially diagnosed with HFrEF in 2012. EF previously  improved to 50-55% on medications. He had been lost to follow up in the HF clinic at [facility] and had been followed at [facility]. Most recent cath 2015 showed nonobstructive LCX OM 20%.  In 2022 EF went back down 25-30% and has continued to drop. EF now < 20%. He does have single chamber Biotronic.  Over the last 6 months he has had multiple heart failure admissions.  Admission presentation concerning for low output heart failure with abd discomfort. On exam his is cool and dry.  Now diuresed.  Place PICC and check CO-OX /CVP.  If CO-OX low will need to add inotropes.  Cut back carvedilol 3.125 mg twice a day with suspected low output.  Dig level 0.9. Dig was cut back 0.125 mcg.  He has been diuresed with IV lasix with  brisk diuresis noted.  GDMT limited by soft BP.  Concerning he may need advanced therapies-->LVAD   Eventually may need CMRI but he had IV Iron this admit which could interfere with results.   2 A fib  Last in SR back in March of this year. Back in A fib in May and on admit.  Cut back coreg to 3.125 mg twice a day. - Load with IV amio . TEE DC-CV tomorrow.   -Continue eliquis 5 mg twice a day.  -Will need to try and restore SR.   3. CKD Stage IIIa -Creatinine baseline ~1.  -Creatinine 1.4 today. Follow BMET.   4. Anemia, IDA -Iron sats 2%.  -Given IV iron.   Length of Stay: 0  Tonye Becket, NP  08/09/2022, 12:57 PM  Advanced Heart Failure Team Pager [phone] (M-F; 7a - 5p)  Please contact [facility] for night-coverage after hours (4p -7a ) and weekends on [email or web address]  Patient seen with NP, agree with the above note.   Patient has long history of nonischemic cardiomyopathy, as described above.  Initially followed at [facility], more recently at [facility] in [place].  He has had several admission for CHF exacerbations earlier this year.  He was readmitted this time with worsening dyspnea over several days.  Echo this admission with EF < 20%, severe LV dilation, moderate RV dysfunction, echodensity on device lead in RV.  EF is similar to most recent prior echo in 2/24 with reported EF 15-20%. Patient has history of paroxysmal atrial fibrillation on Eliquis, he was admitted in atrial fibrillation rate 90s.  Patient was diuresed initially.   General: NAD Neck: No JVD, no thyromegaly or thyroid nodule.  Lungs: Clear to auscultation bilaterally with normal respiratory effort. CV: Lateral PMI.  Heart irregular S1/S2, no S3/S4, no murmur.  No peripheral edema.  No carotid bruit.  Normal pedal pulses.  Abdomen: Soft, nontender, no hepatosplenomegaly, no distention.  Skin: Intact without lesions or rashes.  Neurologic: Alert and oriented x 3.  Psych: Normal affect. Extremities: No  clubbing or cyanosis. Cool.  HEENT: Normal.   1. Acute on chronic systolic CHF: Long-standing nonischemic cardiomyopathy. Biotronik ICD. Echo this admission with EF < 20%, severe LV dilation, moderate RV dysfunction, echodensity on device lead in RV.  EF is similar to most recent prior echo in 2/24 with reported EF 15-20%. Atrial fibrillation may have triggered acute exacerbation, but situation is concerning given multiple admissions for HF in the last 6 months. Extremities are cool, I am concerned for low output HF. Lactate was elevated at admission but normalized. I worry that we may need to consider advanced therapies (most likely LVAD) at this point. Currently, he does not look significantly volume overloaded on exam (was diuresed this admission).  - Can hold Lasix for now, will confirm volume status via CVP (see below).  - Start Jardiance 10 mg daily.  - Decrease Coreg to 3.125 mg bid.  - Continue digoxin 0.125 daily.  - If BP remains stable, add on Entresto and spironolactone in future.  - I will place a PICC to follow CVP and co-ox. - Need to get him back into NSR (see below).  - Ideally would get cardiac MRI in the future but had IV Fe this admission.  - He would like to be followed again at [facility] heart failure clinic.  We may need to work him up for LVAD in the near future. Discussed this possibility.  2. Atrial fibrillation: Persistent.  He was admitted with AF rate in 90s.  Suspect this contributed to his CHF exacerbation.  - Continue Eliquis, uncertain about whether he has missed recent doses but he has been confused about his meds at home.  - I do not think he will maintain NSR long-term without anti-arrhythmic.  I will start amiodarone gtt prior to cardioversion (today).  - He is set up for TEE-guided DCCV tomorrow. Discussed risks/benefits with patient and he agrees to procedure.  - Consider atrial fibrillation ablation in future.  3. ?RV lead vegetation: I reviewed the echo, it is  possible that this is due to the "curlicue" that the lead forms in the right heart as noted on CXR.  However, need to rule out vegetation. Afebrile, WBCs not elevated.  - He will have TEE with DCCV tomorrow to assess.  - Send blood cultures.  4. Fe  deficiency anemia: Has had IV Fe.  5. CKD stage 3: Creatinine 1.37, follow.   Marca Ancona 08/09/2022 3:49 PM

## 2022-08-09 NOTE — Progress Notes (Signed)
Heart Failure Navigator Progress Note  Assessed for Heart & Vascular TOC clinic readiness.  Patient does not meet criteria due to Advanced Heart Failure Team consulted. .   Navigator will sign off at this time.   Musab Wingard, BSN, RN Heart Failure Nurse Navigator Secure Chat Only   

## 2022-08-09 NOTE — Progress Notes (Signed)
Peripherally Inserted Central Catheter Placement  The IV Nurse has discussed with the patient and/or persons authorized to consent for the patient, the purpose of this procedure and the potential benefits and risks involved with this procedure.  The benefits include less needle sticks, lab draws from the catheter, and the patient may be discharged home with the catheter. Risks include, but not limited to, infection, bleeding, blood clot (thrombus formation), and puncture of an artery; nerve damage and irregular heartbeat and possibility to perform a PICC exchange if needed/ordered by physician.  Alternatives to this procedure were also discussed.  Bard Power PICC patient education guide, fact sheet on infection prevention and patient information card has been provided to patient /or left at bedside.    PICC Placement Documentation  PICC Double Lumen 08/09/22 Right Basilic 41 cm 0 cm (Active)  Indication for Insertion or Continuance of Line Vasoactive infusions 08/09/22 2117  Exposed Catheter (cm) 0 cm 08/09/22 2117  Site Assessment Clean, Dry, Intact 08/09/22 2117  Lumen #1 Status Flushed;Saline locked;Blood return noted 08/09/22 2117  Lumen #2 Status Flushed;Saline locked;Blood return noted 08/09/22 2117  Dressing Type Transparent;Securing device 08/09/22 2117  Dressing Status Antimicrobial disc in place;Clean, Dry, Intact 08/09/22 2117  Safety Lock Not Applicable 08/09/22 2117  Line Care Connections checked and tightened 08/09/22 2117  Dressing Intervention New dressing 08/09/22 2117  Dressing Change Due 08/16/22 08/09/22 2117       Jaecion Dempster, Lajean Manes 08/09/2022, 9:18 PM

## 2022-08-09 NOTE — Assessment & Plan Note (Addendum)
Labs consistent with iron deficiency anemia. Patient is taking Eliquis as below. Hgb stable 8.9. -AM CBC -Replete Iron, B12 -Consider inpt GI evaluation -IV iron x2

## 2022-08-09 NOTE — Progress Notes (Signed)
MEWS Progress Note  Patient Details Name: Austin [name] MRN: [id number] DOB: 10-Mar-1955 Today's Date: 08/09/2022   MEWS Flowsheet Documentation:  Assess: MEWS Score Temp: 97.9 F (36.6 C) BP: (!) 116/91 MAP (mmHg): 100 Pulse Rate: (!) 106 ECG Heart Rate: (!) 102 Resp: 13 Level of Consciousness: Alert SpO2: 90 % O2 Device: Room Air Assess: MEWS Score MEWS Temp: 0 MEWS Systolic: 0 MEWS Pulse: 1 MEWS RR: 1 MEWS LOC: 0 MEWS Score: 2 MEWS Score Color: Yellow Assess: SIRS CRITERIA SIRS Temperature : 0 SIRS Respirations : 0 SIRS Pulse: 1 SIRS WBC: 0 SIRS Score Sum : 1 SIRS Temperature : 0 SIRS Pulse: 1 SIRS Respirations : 0 SIRS WBC: 0 SIRS Score Sum : 1 Assess: if the MEWS score is Yellow or Red Were vital signs taken at a resting state?: Yes Focused Assessment: No change from prior assessment Does the patient meet 2 or more of the SIRS criteria?: No MEWS guidelines implemented : Yes, yellow Treat MEWS Interventions: Considered administering scheduled or prn medications/treatments as ordered Take Vital Signs Increase Vital Sign Frequency : Yellow: Q2hr x1, continue Q4hrs until patient remains green for 12hrs Escalate MEWS: Escalate: Yellow: Discuss with charge nurse and consider notifying provider and/or RRT Notify: Charge Nurse/RN Name of Charge Nurse/RN Notified: creshenda Provider Notification Provider Name/Title: dr. Royann Shivers Date Provider Notified: 08/09/22 Time Provider Notified: 0930 Method of Notification: Face-to-face Notification Reason: Other (Comment) (Afib RVR) Test performed and critical result: Lactic acid 2.9 Date Critical Result Received: 08/07/22 Time Critical Result Received: 1093 Provider response: At bedside Date of Provider Response: 08/09/22 Time of Provider Response: 0930      Arvella Merles I 08/09/2022, 10:18 AM

## 2022-08-09 NOTE — Assessment & Plan Note (Addendum)
Cr 1.37. CKD Stage 3. Per Care Everywhere, baseline 1-1.20.

## 2022-08-09 NOTE — Evaluation (Signed)
Occupational Therapy Evaluation Patient Details Name: Austin [name] MRN: [id number] DOB: [birth year] Today's Date: 08/09/2022   History of Present Illness 67 yo male admitted 6/23 with dyspnea and CHF exacerbation. 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 Pt currently at baseline completing ADLs without assist, ind with bed mobility and hallway distance ambulation. Pt with 1/4 DOE with activity, audible SOB, pt educated on energy conservation strategies and pt verbalized understanding, reports he already implements these daily. Pt presenting with impairments listed below, however has no acute OT needs at this time, will s/o. Please reconsult if there is a change in pt status. Anticipate no OT follow up needs at d/c.      Recommendations for follow up therapy are one component of a multi-disciplinary discharge planning process, led by the attending physician.  Recommendations may be updated based on patient status, additional functional criteria and insurance authorization.   Assistance Recommended at Discharge PRN  Patient can return home with the following      Functional Status Assessment  Patient has had a recent decline in their functional status and demonstrates the ability to make significant improvements in function in a reasonable and predictable amount of time.  Equipment Recommendations  None recommended by OT    Recommendations for Other Services       Precautions / Restrictions Precautions Precautions: None Restrictions Weight Bearing Restrictions: No      Mobility Bed Mobility Overal bed mobility: Independent                  Transfers Overall transfer level: Independent                        Balance Overall balance assessment: Mild deficits observed, not formally tested                                          ADL either performed or assessed with clinical judgement   ADL Overall ADL's : At baseline                                       General ADL Comments: pt able to don/doff sock, complete toileting/tub transfer and hallway distance ambulation without assist     Vision   Vision Assessment?: No apparent visual deficits     Perception Perception Perception Tested?: No   Praxis Praxis Praxis tested?: Not tested    Pertinent Vitals/Pain Pain Assessment Pain Assessment: No/denies pain     Hand Dominance Right   Extremity/Trunk Assessment Upper Extremity Assessment Upper Extremity Assessment: Overall WFL for tasks assessed   Lower Extremity Assessment Lower Extremity Assessment: Defer to PT evaluation   Cervical / Trunk Assessment Cervical / Trunk Assessment: Normal   Communication Communication Communication: No difficulties   Cognition Arousal/Alertness: Awake/alert Behavior During Therapy: WFL for tasks assessed/performed Overall Cognitive Status: Within Functional Limits for tasks assessed                                 General Comments: tangential in conversation at times     General Comments  VSS on RA  Exercises     Shoulder Instructions      Home Living Family/patient expects to be discharged to:: Private residence Living Arrangements: Alone   Type of Home: Apartment Home Access: Level entry     Home Layout: One level     Bathroom Shower/Tub: Chief Strategy Officer: Standard     Home Equipment: None   Additional Comments: reports all family is ~2.5 hours away      Prior Functioning/Environment Prior Level of Function : Independent/Modified Independent             Mobility Comments: no AD use ADLs Comments: ind; works part time        OT Problem List: Decreased activity tolerance      OT Treatment/Interventions:      OT Goals(Current goals can be found in the care plan section)  Acute Rehab OT Goals OT Goal Formulation: With patient Time For Goal Achievement: 08/23/22 Potential to Achieve Goals: Good  OT Frequency:      Co-evaluation              AM-PAC OT "6 Clicks" Daily Activity     Outcome Measure Help from another person eating meals?: None Help from another person taking care of personal grooming?: None Help from another person toileting, which includes using toliet, bedpan, or urinal?: None Help from another person bathing (including washing, rinsing, drying)?: None Help from another person to put on and taking off regular upper body clothing?: None Help from another person to put on and taking off regular lower body clothing?: None 6 Click Score: 24   End of Session Nurse Communication: Mobility status  Activity Tolerance: Patient tolerated treatment well Patient left: in chair;with call bell/phone within reach  OT Visit Diagnosis: Muscle weakness (generalized) (M62.81)                Time: 1610-9604 OT Time Calculation (min): 32 min Charges:  OT General Charges $OT Visit: 1 Visit OT Evaluation $OT Eval Low Complexity: 1 Low OT Treatments $Self Care/Home Management : 8-22 mins  Carver Fila, OTD, OTR/L SecureChat Preferred Acute Rehab [phone]   [place] Cornia K Koonce 08/09/2022, 12:03 PM

## 2022-08-09 NOTE — Progress Notes (Signed)
Rounding Note    Patient Name: Austin [name] Date of Encounter: 08/09/2022  [facility] Cardiologist: None ([facility])  Subjective   Generally feeling better.  Had a 30-minute episode of abdominal discomfort and throat tightness last night, better this morning. Mains in atrial fibrillation, ventricular rate mostly in the 90s at rest, 120-100 30s with walking. Dig level 0.9.  Creatinine actually shows some improvement. Blood pressure remained fairly low overnight, 120/88 this morning. Maintaining good urine output, net diuresis 5 L since admission. Weight down almost 6 kg since admission. Stable anemia around 9.  Inpatient Medications    Scheduled Meds:  apixaban  5 mg Oral BID   carvedilol  12.5 mg Oral BID WC   vitamin B-12  1,000 mcg Oral Daily   digoxin  0.125 mg Oral Daily   finasteride  5 mg Oral Daily   melatonin  3 mg Oral QHS   pantoprazole  40 mg Oral Daily   potassium chloride  40 mEq Oral Q4H   pravastatin  40 mg Oral Daily   tamsulosin  0.4 mg Oral QPC supper   Continuous Infusions:  PRN Meds: albuterol   Vital Signs    Vitals:   08/08/22 2332 08/09/22 0359 08/09/22 0707 08/09/22 0723  BP: 90/61 92/72  (!) 119/91  Pulse: 99 83  (!) 103  Resp: 17 20  20   Temp: 98.4 F (36.9 C) 97.6 F (36.4 C)  97.9 F (36.6 C)  TempSrc: Oral Oral  Oral  SpO2: 94% 95%  91%  Weight:   94.4 kg   Height:        Intake/Output Summary (Last 24 hours) at 08/09/2022 0857 Last data filed at 08/08/2022 2300 Gross per 24 hour  Intake 450 ml  Output 1225 ml  Net -775 ml      08/09/2022    7:07 AM 08/08/2022    6:14 AM 08/07/2022    7:13 AM  Last 3 Weights  Weight (lbs) 208 lb 1.8 oz 205 lb 7.5 oz 222 lb  Weight (kg) 94.4 kg 93.2 kg 100.699 kg      Telemetry    Atrial fibrillation with controlled ventricular rate at rest.- Personally Reviewed  ECG    No new tracing- Personally Reviewed  Physical Exam  Appears well, sitting up in chair GEN: No  acute distress.   Neck: No JVD Cardiac: Regular, no murmurs, rubs, S3 gallop present.  Respiratory: Clear to auscultation bilaterally. GI: Soft, nontender, non-distended  MS: No edema; No deformity. Neuro:  Nonfocal  Psych: Normal affect   Labs    High Sensitivity Troponin:   Recent Labs  Lab 08/07/22 0719 08/07/22 0912  TROPONINIHS 48* 45*     Chemistry Recent Labs  Lab 08/07/22 0719 08/07/22 0912 08/08/22 0016 08/09/22 0006  NA 140  --  140 138  K 3.7  --  3.4* 3.6  CL 108  --  106 108  CO2 20*  --  24 23  GLUCOSE 164*  --  127* 121*  BUN 19  --  19 17  CREATININE 1.46*  --  1.45* 1.37*  CALCIUM 8.9  --  8.5* 8.6*  MG  --   --  2.1 2.1  PROT  --  5.5* 5.3*  --   ALBUMIN  --  3.4* 3.3*  --   AST  --  52* 39  --   ALT  --  37 46*  --   ALKPHOS  --  60 62  --  BILITOT  --  2.7* 2.9*  --   GFRNONAA 52*  --  52* 56*  ANIONGAP 12  --  10 7    Lipids No results for input(s): "CHOL", "TRIG", "HDL", "LABVLDL", "LDLCALC", "CHOLHDL" in the last 168 hours.  Hematology Recent Labs  Lab 08/07/22 0719 08/08/22 0016 08/09/22 0006  WBC 8.2 7.9 8.3  RBC 4.02* 3.95*  3.90* 3.90*  HGB 9.1* 9.1* 8.9*  HCT 31.5* 31.1* 30.0*  MCV 78.4* 78.7* 76.9*  MCH 22.6* 23.0* 22.8*  MCHC 28.9* 29.3* 29.7*  RDW 17.3* 17.1* 17.0*  PLT 231 198 203   Thyroid No results for input(s): "TSH", "FREET4" in the last 168 hours.  BNP Recent Labs  Lab 08/07/22 0719  BNP 872.8*    DDimer No results for input(s): "DDIMER" in the last 168 hours.   Radiology    ECHOCARDIOGRAM COMPLETE  Result Date: 08/08/2022    ECHOCARDIOGRAM REPORT   Patient Name:   AVIR DERUITER Date of Exam: 08/08/2022 Medical Rec #:  [id number]        Height:       76.0 in Accession #:    [id number]       Weight:       205.5 lb Date of Birth:  [birth year]         BSA:          2.241 m Patient Age:    67 years         BP:           90/65 mmHg Patient Gender: M                HR:           86 bpm. Exam Location:  Inpatient  Procedure: 2D Echo, 3D Echo, Cardiac Doppler, Color Doppler and Intracardiac            Opacification Agent Indications:    Dyspnea R06.00  History:        Patient has prior history of Echocardiogram examinations, most                 recent 08/08/2011. CHF and Cardiomyopathy, Pacemaker,                 Arrythmias:Atrial Fibrillation, Signs/Symptoms:Dyspnea; Risk                 Factors:Hypertension, Former Smoker and Dyslipidemia.  Sonographer:    Aron Baba Referring Phys: [id number] CARINA M BROWN IMPRESSIONS  1. Left ventricular ejection fraction, by estimation, is <20%. The left ventricle has severely decreased function. The left ventricle demonstrates global hypokinesis. The left ventricular internal cavity size was severely dilated. Left ventricular diastolic parameters are indeterminate.  2. Echodensity noted on device lead in RV. Cannot exclude vegetation. Right ventricular systolic function is moderately reduced. The right ventricular size is moderately enlarged. There is normal pulmonary artery systolic pressure.  3. Left atrial size was mildly dilated.  4. Right atrial size was moderately dilated.  5. The mitral valve is normal in structure. Mild mitral valve regurgitation. No evidence of mitral stenosis.  6. The aortic valve is tricuspid. Aortic valve regurgitation is not visualized. No aortic stenosis is present.  7. Aortic dilatation noted. There is mild dilatation of the ascending aorta, measuring 40 mm.  8. The inferior vena cava is dilated in size with >50% respiratory variability, suggesting right atrial pressure of 8 mmHg. Conclusion(s)/Recommendation(s): No left ventricular mural or apical thrombus/thrombi. Severely reduced LVEF, <20% with  severe dilation of LV chamber. Echodensity noted on device lead in RV. Cannot exclude vegetation. Findings communicated to ordering team. FINDINGS  Left Ventricle: Left ventricular ejection fraction, by estimation, is <20%. The left ventricle has severely  decreased function. The left ventricle demonstrates global hypokinesis. Definity contrast agent was given IV to delineate the left ventricular endocardial borders. The left ventricular internal cavity size was severely dilated. There is no left ventricular hypertrophy. Left ventricular diastolic parameters are indeterminate. Right Ventricle: Echodensity noted on device lead in RV. Cannot exclude vegetation. The right ventricular size is moderately enlarged. No increase in right ventricular wall thickness. Right ventricular systolic function is moderately reduced. There is normal pulmonary artery systolic pressure. The tricuspid regurgitant velocity is 2.19 m/s, and with an assumed right atrial pressure of 8 mmHg, the estimated right ventricular systolic pressure is 27.2 mmHg. Left Atrium: Left atrial size was mildly dilated. Right Atrium: Right atrial size was moderately dilated. Pericardium: There is no evidence of pericardial effusion. Mitral Valve: The mitral valve is normal in structure. Mild mitral valve regurgitation. No evidence of mitral valve stenosis. Tricuspid Valve: The tricuspid valve is normal in structure. Tricuspid valve regurgitation is mild . No evidence of tricuspid stenosis. Aortic Valve: The aortic valve is tricuspid. Aortic valve regurgitation is not visualized. No aortic stenosis is present. Pulmonic Valve: The pulmonic valve was not well visualized. Pulmonic valve regurgitation is trivial. No evidence of pulmonic stenosis. Aorta: Aortic dilatation noted. There is mild dilatation of the ascending aorta, measuring 40 mm. Venous: The inferior vena cava is dilated in size with greater than 50% respiratory variability, suggesting right atrial pressure of 8 mmHg. IAS/Shunts: The atrial septum is grossly normal. Additional Comments: A device lead is visualized in the right ventricle and right atrium.  LEFT VENTRICLE PLAX 2D LVIDd:         7.80 cm   Diastology LVIDs:         7.00 cm   LV e' medial:     7.29 cm/s LV PW:         1.00 cm   LV E/e' medial:  14.1 LV IVS:        0.70 cm   LV e' lateral:   8.81 cm/s LVOT diam:     2.30 cm   LV E/e' lateral: 11.7 LV SV:         40 LV SV Index:   18 LVOT Area:     4.15 cm  RIGHT VENTRICLE RV S prime:     7.46 cm/s TAPSE (M-mode): 1.1 cm LEFT ATRIUM             Index        RIGHT ATRIUM           Index LA diam:        4.10 cm 1.83 cm/m   RA Area:     27.10 cm LA Vol (A2C):   62.2 ml 27.76 ml/m  RA Volume:   91.00 ml  40.61 ml/m LA Vol (A4C):   62.2 ml 27.76 ml/m LA Biplane Vol: 64.1 ml 28.61 ml/m  AORTIC VALVE             PULMONIC VALVE LVOT Vmax:   68.00 cm/s  PR End Diast Vel: 6.45 msec LVOT Vmean:  43.200 cm/s LVOT VTI:    0.096 m  AORTA Ao Root diam: 3.80 cm Ao Asc diam:  4.00 cm MITRAL VALVE  TRICUSPID VALVE MV Area (PHT): 4.80 cm     TR Peak grad:   19.2 mmHg MV Decel Time: 158 msec     TR Vmax:        219.00 cm/s MR Peak grad: 59.0 mmHg MR Mean grad: 33.0 mmHg     SHUNTS MR Vmax:      384.00 cm/s   Systemic VTI:  0.10 m MR Vmean:     266.0 cm/s    Systemic Diam: 2.30 cm MR PISA:      2.26 cm MV E velocity: 103.00 cm/s MV A velocity: 45.70 cm/s MV E/A ratio:  2.25 Jodelle Red MD Electronically signed by Jodelle Red MD Signature Date/Time: 08/08/2022/11:28:07 AM    Final    CT Angio Chest Pulmonary Embolism (PE) W or WO Contrast  Result Date: 08/07/2022 CLINICAL DATA:  Pulmonary embolism suspected, chest pain EXAM: CT ANGIOGRAPHY CHEST WITH CONTRAST TECHNIQUE: Multidetector CT imaging of the chest was performed using the standard protocol during bolus administration of intravenous contrast. Multiplanar CT image reconstructions and MIPs were obtained to evaluate the vascular anatomy. RADIATION DOSE REDUCTION: This exam was performed according to the departmental dose-optimization program which includes automated exposure control, adjustment of the mA and/or kV according to patient size and/or use of iterative reconstruction  technique. CONTRAST:  75mL OMNIPAQUE IOHEXOL 350 MG/ML SOLN COMPARISON:  08/07/2022, 12/08/2010 FINDINGS: Cardiovascular: This is a technically adequate evaluation of the pulmonary vasculature. No filling defects or pulmonary emboli. The heart is enlarged, with prominent left ventricular dilatation. Single lead cardiac pacer seen within the right ventricular lumen. Normal caliber of the thoracic aorta. Atherosclerosis of the aorta and coronary vasculature. Mediastinum/Nodes: No enlarged mediastinal, hilar, or axillary lymph nodes. Thyroid gland, trachea, and esophagus demonstrate no significant findings. Lungs/Pleura: Small bilateral pleural effusions, right greater than left. No acute airspace disease or pneumothorax. The central airways are patent. Hypoventilatory changes are seen at the lung bases. Upper Abdomen: Pericholecystic fat stranding again noted, please see earlier CT and ultrasound abdomen reports. Remainder of the upper abdomen is unremarkable. Musculoskeletal: No acute or destructive bony abnormalities. Reconstructed images demonstrate no additional findings. Review of the MIP images confirms the above findings. IMPRESSION: 1. No evidence of pulmonary embolus. 2. Cardiomegaly, with prominent left ventricular dilatation. 3. Small bilateral pleural effusions, right greater than left. 4. Pericholecystic fat stranding partially visualized, unchanged since earlier CT. Please see previous CT and ultrasound abdomen reports. 5. Aortic Atherosclerosis (ICD10-I70.0). Coronary artery atherosclerosis. Electronically Signed   By: Sharlet Salina M.D.   On: 08/07/2022 20:45   US Abdomen Limited RUQ (LIVER/GB)  Result Date: 08/07/2022 CLINICAL DATA:  [id number] Abdominal pain [id number] EXAM: ULTRASOUND ABDOMEN LIMITED RIGHT UPPER QUADRANT COMPARISON:  CT 08/07/2022 FINDINGS: Gallbladder: No gallstones or wall thickening visualized. No sonographic Murphy sign noted by sonographer. Common bile duct: Diameter: 2 mm. Liver:  No focal lesion identified. Mildly increased hepatic parenchymal echogenicity. Portal vein is patent on color Doppler imaging with normal direction of blood flow towards the liver. Other: None. IMPRESSION: 1. Unremarkable sonographic appearance of the gallbladder. 2. The echogenicity of the liver is mildly increased. This is a nonspecific finding but is most commonly seen with fatty infiltration of the liver. 3. No focal liver lesion identified sonographically to correspond to the CT findings. Electronically Signed   By: Duanne Guess D.O.   On: 08/07/2022 12:45   CT ABDOMEN PELVIS W CONTRAST  Result Date: 08/07/2022 CLINICAL DATA:  Abdominal pain, acute. EXAM: CT ABDOMEN AND PELVIS WITH  CONTRAST TECHNIQUE: Multidetector CT imaging of the abdomen and pelvis was performed using the standard protocol following bolus administration of intravenous contrast. RADIATION DOSE REDUCTION: This exam was performed according to the departmental dose-optimization program which includes automated exposure control, adjustment of the mA and/or kV according to patient size and/or use of iterative reconstruction technique. CONTRAST:  75mL OMNIPAQUE IOHEXOL 350 MG/ML SOLN COMPARISON:  None Available. FINDINGS: Lower chest: Artifact from ICD wires identified within the heart. Bilateral pleural effusions, right greater than left. Asymmetric scratch ground-glass attenuation and interlobular septal thickening identified within the lung bases. Hepatobiliary: Low attenuation structure along the lateral dome of liver measures 0.9 by 0.7 cm and is too small to reliably characterize. Focal enhancing lesion within the lateral segment of left hepatic lobe measures 1.3 cm and is favored to represent either a hemangioma or benign vascular abnormality. Gallbladder appears normal. Small stone or polyp noted within the gallbladder measuring 4 mm. Mild low-density thickening of the gallbladder wall appears asymmetric measuring up to 5 mm. No  surrounding fat stranding. No evidence for bile duct dilatation. Pancreas: Unremarkable. No pancreatic ductal dilatation or surrounding inflammatory changes. Spleen: The spleen appears normal in size. Three focal enhancing lesions within the spleen are noted which measure up to 5 mm, image 35/3. Adrenals/Urinary Tract: Normal adrenal glands. No suspicious kidney mass, nephrolithiasis, or hydronephrosis. The urinary bladder is unremarkable. Stomach/Bowel: Stomach is normal. The appendix is visualized and is within normal limits. No pathologic dilatation of the bowel loops to suggest obstruction. Sigmoid diverticulosis is identified without signs of acute diverticulitis. Vascular/Lymphatic: Aortic atherosclerosis. No signs of abdominopelvic adenopathy. Reproductive: Prostate gland enlargement with mass effect upon the base of bladder. Other: No free fluid or fluid collections. Musculoskeletal: No acute or suspicious osseous findings. L5-S1 degenerative disc disease. IMPRESSION: 1. Bilateral pleural effusions, right greater than left. Ground-glass attenuation and interlobular septal thickening identified within the lung bases. Correlate for any clinical signs or symptoms of CHF. 2. Small stone or polyp within the gallbladder measuring 4 mm. Mild low-density thickening of the gallbladder wall appears asymmetric measuring up to 5 mm. No surrounding fat stranding. If there is a clinical concern for acute cholecystitis consider further evaluation with right upper quadrant ultrasound. 3. Three focal enhancing lesions within the spleen are noted which measure up to 5 mm. These are nonspecific and may represent small hemangiomas or benign vascular lesions. 4. Prostate gland enlargement with mass effect upon the base of bladder. 5.  Aortic Atherosclerosis (ICD10-I70.0). Electronically Signed   By: Signa Kell M.D.   On: 08/07/2022 10:42    Cardiac Studies   Echocardiogram 08/08/2022  1. Left ventricular ejection  fraction, by estimation, is <20%. The left ventricle has severely decreased function. The left ventricle demonstrates global hypokinesis. The left ventricular internal cavity size was severely dilated. Left ventricular diastolic parameters are indeterminate.  2. Echodensity noted on device lead in RV. Cannot exclude vegetation. Right ventricular systolic function is moderately reduced. The right ventricular size is moderately enlarged. There is normal pulmonary artery systolic pressure.  3. Left atrial size was mildly dilated.  4. Right atrial size was moderately dilated.  5. The mitral valve is normal in structure. Mild mitral valve regurgitation. No evidence of mitral stenosis.  6. The aortic valve is tricuspid. Aortic valve regurgitation is not visualized. No aortic stenosis is present.  7. Aortic dilatation noted. There is mild dilatation of the ascending aorta, measuring 40 mm.  8. The inferior vena cava is dilated in size  with >50% respiratory variability, suggesting right atrial pressure of 8 mmHg. Conclusion(s)/Recommendation(s): No left ventricular mural or apical thrombus/thrombi. Severely reduced LVEF, <20% with severe dilation of LV chamber. Echodensity noted on device lead in RV. Cannot exclude vegetation. Findings communicated to ordering team. FINDINGS  Left Ventricle: Left ventricular ejection fraction, by estimation, is <20%  Patient Profile     67 y.o. male with longstanding severe nonischemic cardiomyopathy, LVEF 15%, status post ICD, presents with acute on chronic heart failure exacerbation, symptoms of low cardiac output, persistent atrial fibrillation with rapid ventricular response  Assessment & Plan    CHF: Tenuous compensation.  Improved with diuresis.  Still has occasional symptoms of low cardiac output.  Little room to provide guideline directed medical therapy due to low blood pressure.  Only on carvedilol which also provides rate control.  Uncertain if the atrial fibrillation is  the cause or consequence of decompensation, but he would benefit from return to normal rhythm.  Also note moderate anemia, which probably contributes to decompensation. Persistent A-fib: Mediocre rate control.  Unable to increase carvedilol due to hypotension.  Digoxin level is not in toxic range but is relatively high.  He was reportedly in sinus rhythm in March when he was doing better.  ED visit on 07/14/2022 showed atrial fibrillation and his functional status has worsened since.  We discussed DC cardioversion today.  He has not missed Eliquis in the last 3 weeks.  Will try to see if we can do a cardioversion before discharge. Anemia: Consistent with iron deficiency.  No obvious active bleeding.  Stable hemoglobin.  Received iron infusion yesterday.  Had a colonoscopy about 3 years ago.  Will require additional workup as an outpatient. ICD: Report of possible vegetation on ICD lead.  I think it is actually a rather mobile redundant loop of lead that is intersecting the ultrasound plane more than once, creating a artifactual impression of vegetation.  Clinically he does not have endocarditis.    Informed Consent   Shared Decision Making/Informed Consent The risks (stroke, cardiac arrhythmias rarely resulting in the need for a temporary or permanent pacemaker, skin irritation or burns and complications associated with conscious sedation including aspiration, arrhythmia, respiratory failure and death), benefits (restoration of normal sinus rhythm) and alternatives of a direct current cardioversion were explained in detail to Mr. Allyne Gee and he agrees to proceed.        For questions or updates, please contact [facility] Please consult [email or web address] for contact info under        Signed, Thurmon Fair, MD  08/09/2022, 8:57 AM

## 2022-08-09 NOTE — Progress Notes (Signed)
Daily Progress Note Intern Pager: [phone]  Patient name: Austin [name] Medical record number: [id number] Date of birth: 1955-12-17 Age: 67 y.o. Gender: male  Primary Care Provider: [name], NP Consultants: Cardiology Code Status: Full  Pt Overview and Major Events to Date:  -Admitted 08/08/22  Assessment and Plan: Austin [name] is a 67 y.o. male admitted for acute on chronic HFrEF exacerbation. Pertinent PMH/PSH includes HTN, CHF s/p ICD, HLD, CKD3.   Suspect patient's cardiac status chronically worsening.  Persistent atrial fibrillation may be contributing to current hospitalization, cardiology planning for cardioversion.  Will see if cardioversion benefits patient.  Hospital Problem List      Hospital     * (Principal) Acute exacerbation of CHF (congestive heart failure)  (HCC)     I/O -4.9L. Now on RA. Digoxin level wnl. Last EF February 2024 for 15  to 20%. ECHO demonstrating similar EF, possible ICD lead vegetation.  Stomach pain likely due to volume overload, unable to further diuresis  given Bps. -Strict I's and O's -Daily weights -Continue digoxin, Coreg, hold Entresto, Jardiance given kidney  function/BP -Consult cardiology, recs appreciated  -Hold diuresis for BP  -Low suspicion for vegetation  -Tentative cardioversion tomorrow -Consider HF consult -Bentyl, Miralax        Anemia     Labs consistent with iron deficiency anemia. Patient is taking Eliquis  as below. Hgb stable 8.9. -AM CBC -Replete Iron, B12 -Consider inpt GI evaluation -IV iron x2        Atrial fibrillation (HCC)     Heart rate controlled 80-90s on exam today.  Currently in A-fib. -Continue home Coreg -Continue Eliquis -Cardioversion tomorrow        Elevated serum creatinine     Cr 1.37. CKD Stage 3. Per Care Everywhere, baseline 1-1.20.        HFrEF (heart failure with reduced ejection fraction) (HCC)    FEN/GI: N.p.o. at midnight for potential  cardioversion PPx: Eliquis Dispo: Pending clinical improvement  Subjective:  No acute events overnight, patient did have short run of V. tach but was asymptomatic.  Blood pressures continue to be soft.  Does endorse increased belly pain this a.m. after not having further diuresis yesterday.  Last bowel movement 2 days ago.  Does have some lightheaded and dizziness when he stands up that is not different from when he is at home.  Objective: Temp:  [97.6 F (36.4 C)-99 F (37.2 C)] 98.3 F (36.8 C) (06/25 1116) Pulse Rate:  [81-134] 81 (06/25 1116) Resp:  [13-21] 18 (06/25 1116) BP: (82-119)/(56-91) 95/75 (06/25 1116) SpO2:  [90 %-95 %] 92 % (06/25 1116) Weight:  [94.4 kg] 94.4 kg (06/25 0707) Physical Exam: General: NAD  Neuro: A&O Cardiovascular: RRR, no murmurs, no peripheral edema Respiratory: normal WOB on RA, CTAB, no wheezes, ronchi or rales Abdomen: soft, mild diffuse tenderness, no rebound or guarding Extremities: Moving all 4 extremities equally   Laboratory: Most recent CBC Lab Results  Component Value Date   WBC 8.3 08/09/2022   HGB 8.9 (L) 08/09/2022   HCT 30.0 (L) 08/09/2022   MCV 76.9 (L) 08/09/2022   PLT 203 08/09/2022   Most recent BMP    Latest Ref Rng & Units 08/09/2022   12:06 AM  BMP  Glucose 70 - 99 mg/dL 147   BUN 8 - 23 mg/dL 17   Creatinine 8.29 - 1.24 mg/dL 5.62   Sodium 130 - 865 mmol/L 138   Potassium 3.5 -  5.1 mmol/L 3.6   Chloride 98 - 111 mmol/L 108   CO2 22 - 32 mmol/L 23   Calcium 8.9 - 10.3 mg/dL 8.6     Digoxin 0.9  Imaging/Diagnostic Tests:  ECHO  1. Left ventricular ejection fraction, by estimation, is <20%. The left  ventricle has severely decreased function. The left ventricle demonstrates  global hypokinesis. The left ventricular internal cavity size was severely  dilated. Left ventricular  diastolic parameters are indeterminate.   2. Echodensity noted on device lead in RV. Cannot exclude vegetation.  Right ventricular  systolic function is moderately reduced. The right  ventricular size is moderately enlarged. There is normal pulmonary artery  systolic pressure.   3. Left atrial size was mildly dilated.   4. Right atrial size was moderately dilated.   5. The mitral valve is normal in structure. Mild mitral valve  regurgitation. No evidence of mitral stenosis.   6. The aortic valve is tricuspid. Aortic valve regurgitation is not  visualized. No aortic stenosis is present.   7. Aortic dilatation noted. There is mild dilatation of the ascending  aorta, measuring 40 mm.   8. The inferior vena cava is dilated in size with >50% respiratory  variability, suggesting right atrial pressure of 8 mmHg.   Conclusion(s)/Recommendation(s): No left ventricular mural or apical  thrombus/thrombi. Severely reduced LVEF, <20% with severe dilation of LV  chamber. Echodensity noted on device lead in RV. Cannot exclude  vegetation. Findings communicated to ordering  team.   Celine Mans, MD 08/09/2022, 11:40 AM  PGY-1, [facility] Family Medicine FPTS Intern pager: [phone], text pages welcome Secure chat group [facility] Teaching Service

## 2022-08-09 NOTE — Assessment & Plan Note (Addendum)
Heart rate controlled 80-90s on exam today.  Currently in A-fib. -Continue home Coreg -Continue Eliquis -Cardioversion tomorrow

## 2022-08-09 NOTE — Progress Notes (Addendum)
Noted NSVtach 26bts, Pt asymptomatic. Alert 4X. Denies lightheadedness. Still @ afib HR a@ 80's. Awaiting labs result.  K-3.6 Mag 2.1 -acknowledged  order from DR [facility] -plan of care ongoing.

## 2022-08-10 ENCOUNTER — Encounter (HOSPITAL_COMMUNITY)
Admission: EM | Disposition: A | Discharge status: Home / Self Care | Payer: Self-pay | Source: Home / Self Care | Attending: Family Medicine

## 2022-08-10 DIAGNOSIS — R7989 Other specified abnormal findings of blood chemistry: Secondary | ICD-10-CM | POA: Diagnosis present

## 2022-08-10 DIAGNOSIS — Z01818 Encounter for other preprocedural examination: Secondary | ICD-10-CM | POA: Diagnosis not present

## 2022-08-10 DIAGNOSIS — Z8701 Personal history of pneumonia (recurrent): Secondary | ICD-10-CM | POA: Diagnosis not present

## 2022-08-10 DIAGNOSIS — T447X5A Adverse effect of beta-adrenoreceptor antagonists, initial encounter: Secondary | ICD-10-CM | POA: Diagnosis present

## 2022-08-10 DIAGNOSIS — I4891 Unspecified atrial fibrillation: Secondary | ICD-10-CM | POA: Diagnosis not present

## 2022-08-10 DIAGNOSIS — I472 Ventricular tachycardia, unspecified: Secondary | ICD-10-CM | POA: Diagnosis not present

## 2022-08-10 DIAGNOSIS — I4819 Other persistent atrial fibrillation: Secondary | ICD-10-CM | POA: Diagnosis present

## 2022-08-10 DIAGNOSIS — Z6825 Body mass index (BMI) 25.0-25.9, adult: Secondary | ICD-10-CM | POA: Diagnosis not present

## 2022-08-10 DIAGNOSIS — D509 Iron deficiency anemia, unspecified: Secondary | ICD-10-CM | POA: Diagnosis present

## 2022-08-10 DIAGNOSIS — R17 Unspecified jaundice: Secondary | ICD-10-CM | POA: Diagnosis present

## 2022-08-10 DIAGNOSIS — I952 Hypotension due to drugs: Secondary | ICD-10-CM | POA: Diagnosis present

## 2022-08-10 DIAGNOSIS — Z515 Encounter for palliative care: Secondary | ICD-10-CM | POA: Diagnosis not present

## 2022-08-10 DIAGNOSIS — R739 Hyperglycemia, unspecified: Secondary | ICD-10-CM | POA: Diagnosis present

## 2022-08-10 DIAGNOSIS — Z7189 Other specified counseling: Secondary | ICD-10-CM | POA: Diagnosis not present

## 2022-08-10 DIAGNOSIS — N1831 Chronic kidney disease, stage 3a: Secondary | ICD-10-CM | POA: Diagnosis present

## 2022-08-10 DIAGNOSIS — N183 Chronic kidney disease, stage 3 unspecified: Secondary | ICD-10-CM | POA: Diagnosis not present

## 2022-08-10 DIAGNOSIS — E876 Hypokalemia: Secondary | ICD-10-CM | POA: Diagnosis present

## 2022-08-10 DIAGNOSIS — Z7901 Long term (current) use of anticoagulants: Secondary | ICD-10-CM | POA: Diagnosis not present

## 2022-08-10 DIAGNOSIS — I5022 Chronic systolic (congestive) heart failure: Secondary | ICD-10-CM | POA: Diagnosis not present

## 2022-08-10 DIAGNOSIS — E440 Moderate protein-calorie malnutrition: Secondary | ICD-10-CM | POA: Diagnosis present

## 2022-08-10 DIAGNOSIS — K824 Cholesterolosis of gallbladder: Secondary | ICD-10-CM | POA: Diagnosis present

## 2022-08-10 DIAGNOSIS — I428 Other cardiomyopathies: Secondary | ICD-10-CM | POA: Diagnosis present

## 2022-08-10 DIAGNOSIS — E785 Hyperlipidemia, unspecified: Secondary | ICD-10-CM | POA: Diagnosis present

## 2022-08-10 DIAGNOSIS — I2510 Atherosclerotic heart disease of native coronary artery without angina pectoris: Secondary | ICD-10-CM | POA: Diagnosis present

## 2022-08-10 DIAGNOSIS — I5020 Unspecified systolic (congestive) heart failure: Secondary | ICD-10-CM | POA: Diagnosis not present

## 2022-08-10 DIAGNOSIS — N400 Enlarged prostate without lower urinary tract symptoms: Secondary | ICD-10-CM | POA: Diagnosis present

## 2022-08-10 DIAGNOSIS — I252 Old myocardial infarction: Secondary | ICD-10-CM | POA: Diagnosis not present

## 2022-08-10 DIAGNOSIS — I447 Left bundle-branch block, unspecified: Secondary | ICD-10-CM | POA: Diagnosis present

## 2022-08-10 DIAGNOSIS — I130 Hypertensive heart and chronic kidney disease with heart failure and stage 1 through stage 4 chronic kidney disease, or unspecified chronic kidney disease: Secondary | ICD-10-CM | POA: Diagnosis present

## 2022-08-10 DIAGNOSIS — R0602 Shortness of breath: Secondary | ICD-10-CM | POA: Diagnosis present

## 2022-08-10 DIAGNOSIS — I5023 Acute on chronic systolic (congestive) heart failure: Secondary | ICD-10-CM | POA: Diagnosis present

## 2022-08-10 DIAGNOSIS — I4510 Unspecified right bundle-branch block: Secondary | ICD-10-CM | POA: Diagnosis present

## 2022-08-10 HISTORY — PX: RIGHT/LEFT HEART CATH AND CORONARY ANGIOGRAPHY: CATH118266

## 2022-08-10 LAB — POCT I-STAT EG7
Acid-base deficit: 2 mmol/L (ref 0.0–2.0)
Acid-base deficit: 2 mmol/L (ref 0.0–2.0)
Bicarbonate: 21.7 mmol/L (ref 20.0–28.0)
Bicarbonate: 22.7 mmol/L (ref 20.0–28.0)
Calcium, Ion: 1.19 mmol/L (ref 1.15–1.40)
Calcium, Ion: 1.21 mmol/L (ref 1.15–1.40)
HCT: 30 % — ABNORMAL LOW (ref 39.0–52.0)
HCT: 30 % — ABNORMAL LOW (ref 39.0–52.0)
Hemoglobin: 10.2 g/dL — ABNORMAL LOW (ref 13.0–17.0)
Hemoglobin: 10.2 g/dL — ABNORMAL LOW (ref 13.0–17.0)
O2 Saturation: 53 %
O2 Saturation: 55 %
Potassium: 3.6 mmol/L (ref 3.5–5.1)
Potassium: 3.7 mmol/L (ref 3.5–5.1)
Sodium: 139 mmol/L (ref 135–145)
Sodium: 140 mmol/L (ref 135–145)
TCO2: 23 mmol/L (ref 22–32)
TCO2: 24 mmol/L (ref 22–32)
pCO2, Ven: 33.7 mmHg — ABNORMAL LOW (ref 44–60)
pCO2, Ven: 35.6 mmHg — ABNORMAL LOW (ref 44–60)
pH, Ven: 7.413 (ref 7.25–7.43)
pH, Ven: 7.417 (ref 7.25–7.43)
pO2, Ven: 27 mmHg — CL (ref 32–45)
pO2, Ven: 28 mmHg — CL (ref 32–45)

## 2022-08-10 LAB — COOXEMETRY PANEL
Carboxyhemoglobin: 0.8 % (ref 0.5–1.5)
Carboxyhemoglobin: 1.1 % (ref 0.5–1.5)
Methemoglobin: <0.7 % (ref 0.0–1.5)
Methemoglobin: <0.7 % (ref 0.0–1.5)
O2 Saturation: 38.8 %
O2 Saturation: 43 %
Total hemoglobin: 9.2 g/dL — ABNORMAL LOW (ref 12.0–16.0)
Total hemoglobin: 9.5 g/dL — ABNORMAL LOW (ref 12.0–16.0)

## 2022-08-10 LAB — POCT I-STAT 7, (LYTES, BLD GAS, ICA,H+H)
Acid-base deficit: 3 mmol/L — ABNORMAL HIGH (ref 0.0–2.0)
Bicarbonate: 19.8 mmol/L — ABNORMAL LOW (ref 20.0–28.0)
Calcium, Ion: 1.1 mmol/L — ABNORMAL LOW (ref 1.15–1.40)
HCT: 30 % — ABNORMAL LOW (ref 39.0–52.0)
Hemoglobin: 10.2 g/dL — ABNORMAL LOW (ref 13.0–17.0)
O2 Saturation: 96 %
Potassium: 3.5 mmol/L (ref 3.5–5.1)
Sodium: 142 mmol/L (ref 135–145)
TCO2: 21 mmol/L — ABNORMAL LOW (ref 22–32)
pCO2 arterial: 28.9 mmHg — ABNORMAL LOW (ref 32–48)
pH, Arterial: 7.444 (ref 7.35–7.45)
pO2, Arterial: 77 mmHg — ABNORMAL LOW (ref 83–108)

## 2022-08-10 LAB — CBC
HCT: 30.2 % — ABNORMAL LOW (ref 39.0–52.0)
Hemoglobin: 8.7 g/dL — ABNORMAL LOW (ref 13.0–17.0)
MCH: 22.5 pg — ABNORMAL LOW (ref 26.0–34.0)
MCHC: 28.8 g/dL — ABNORMAL LOW (ref 30.0–36.0)
MCV: 78 fL — ABNORMAL LOW (ref 80.0–100.0)
Platelets: 196 10*3/uL (ref 150–400)
RBC: 3.87 MIL/uL — ABNORMAL LOW (ref 4.22–5.81)
RDW: 17.2 % — ABNORMAL HIGH (ref 11.5–15.5)
WBC: 10.7 10*3/uL — ABNORMAL HIGH (ref 4.0–10.5)
nRBC: 0.3 % — ABNORMAL HIGH (ref 0.0–0.2)

## 2022-08-10 LAB — BASIC METABOLIC PANEL
Anion gap: 11 (ref 5–15)
BUN: 21 mg/dL (ref 8–23)
CO2: 21 mmol/L — ABNORMAL LOW (ref 22–32)
Calcium: 9 mg/dL (ref 8.9–10.3)
Chloride: 106 mmol/L (ref 98–111)
Creatinine, Ser: 1.44 mg/dL — ABNORMAL HIGH (ref 0.61–1.24)
GFR, Estimated: 53 mL/min — ABNORMAL LOW (ref >60)
Glucose, Bld: 117 mg/dL — ABNORMAL HIGH (ref 70–99)
Potassium: 4.3 mmol/L (ref 3.5–5.1)
Sodium: 138 mmol/L (ref 135–145)

## 2022-08-10 LAB — DIGOXIN LEVEL: Digoxin Level: 0.8 ng/mL (ref 0.8–2.0)

## 2022-08-10 LAB — MAGNESIUM: Magnesium: 2.2 mg/dL (ref 1.7–2.4)

## 2022-08-10 SURGERY — RIGHT/LEFT HEART CATH AND CORONARY ANGIOGRAPHY
Anesthesia: LOCAL

## 2022-08-10 MED ORDER — HEPARIN (PORCINE) 25000 UT/250ML-% IV SOLN
1350.0000 [IU]/h | INTRAVENOUS | Status: DC
  Administered 2022-08-10: 1200 [IU]/h via INTRAVENOUS
  Filled 2022-08-10: qty 250

## 2022-08-10 MED ORDER — SODIUM CHLORIDE 0.9% FLUSH: 3.0000 mL | INTRAVENOUS | Status: DC | PRN

## 2022-08-10 MED ORDER — SODIUM CHLORIDE 0.9 % IV SOLN: INTRAVENOUS | Status: DC

## 2022-08-10 MED ORDER — ASPIRIN 81 MG PO CHEW
81.0000 mg | CHEWABLE_TABLET | Freq: Once | ORAL | Status: AC
  Administered 2022-08-10: 81 mg via ORAL

## 2022-08-10 MED ORDER — MILRINONE LACTATE IN DEXTROSE 20-5 MG/100ML-% IV SOLN
0.2500 ug/kg/min | INTRAVENOUS | Status: DC
  Administered 2022-08-10: 0.25 ug/kg/min via INTRAVENOUS
  Filled 2022-08-10: qty 100

## 2022-08-10 MED ORDER — FUROSEMIDE 10 MG/ML IJ SOLN: 80.0000 mg | Freq: Two times a day (BID) | INTRAMUSCULAR | Status: DC

## 2022-08-10 MED ORDER — MIDAZOLAM HCL 2 MG/2ML IJ SOLN
INTRAMUSCULAR | Status: DC | PRN
  Administered 2022-08-10: 1 mg via INTRAVENOUS

## 2022-08-10 MED ORDER — MIDAZOLAM HCL 2 MG/2ML IJ SOLN
INTRAMUSCULAR | Status: AC
  Filled 2022-08-10: qty 2

## 2022-08-10 MED ORDER — SODIUM CHLORIDE 0.9% FLUSH: 3.0000 mL | Freq: Two times a day (BID) | INTRAVENOUS | Status: DC

## 2022-08-10 MED ORDER — ONDANSETRON HCL 4 MG/2ML IJ SOLN
4.0000 mg | Freq: Four times a day (QID) | INTRAMUSCULAR | Status: DC | PRN
  Administered 2022-08-11: 4 mg via INTRAVENOUS
  Filled 2022-08-10: qty 2

## 2022-08-10 MED ORDER — HYDRALAZINE HCL 20 MG/ML IJ SOLN: 10.0000 mg | INTRAMUSCULAR | Status: AC | PRN

## 2022-08-10 MED ORDER — LIDOCAINE HCL (PF) 1 % IJ SOLN
INTRAMUSCULAR | Status: AC
  Filled 2022-08-10: qty 30

## 2022-08-10 MED ORDER — FENTANYL CITRATE (PF) 100 MCG/2ML IJ SOLN
INTRAMUSCULAR | Status: DC | PRN
  Administered 2022-08-10: 25 ug via INTRAVENOUS

## 2022-08-10 MED ORDER — SODIUM CHLORIDE 0.9 % IV SOLN: 250.0000 mL | INTRAVENOUS | Status: DC | PRN

## 2022-08-10 MED ORDER — ASPIRIN 81 MG PO CHEW
81.0000 mg | CHEWABLE_TABLET | ORAL | Status: DC
  Filled 2022-08-10: qty 1

## 2022-08-10 MED ORDER — LIDOCAINE HCL (PF) 1 % IJ SOLN
INTRAMUSCULAR | Status: DC | PRN
  Administered 2022-08-10: 5 mL
  Administered 2022-08-10: 4 mL

## 2022-08-10 MED ORDER — IOHEXOL 350 MG/ML SOLN
INTRAVENOUS | Status: DC | PRN
  Administered 2022-08-10: 40 mL

## 2022-08-10 MED ORDER — FUROSEMIDE 10 MG/ML IJ SOLN
80.0000 mg | Freq: Two times a day (BID) | INTRAMUSCULAR | Status: DC
  Administered 2022-08-10 – 2022-08-11 (×3): 80 mg via INTRAVENOUS
  Filled 2022-08-10 (×3): qty 8

## 2022-08-10 MED ORDER — FENTANYL CITRATE (PF) 100 MCG/2ML IJ SOLN
INTRAMUSCULAR | Status: AC
  Filled 2022-08-10: qty 2

## 2022-08-10 MED ORDER — HEPARIN (PORCINE) IN NACL 1000-0.9 UT/500ML-% IV SOLN
INTRAVENOUS | Status: DC | PRN
  Administered 2022-08-10 (×2): 500 mL

## 2022-08-10 MED ORDER — ACETAMINOPHEN 325 MG PO TABS
650.0000 mg | ORAL_TABLET | ORAL | Status: DC | PRN
  Administered 2022-08-11: 650 mg via ORAL
  Filled 2022-08-10: qty 2

## 2022-08-10 MED ORDER — VERAPAMIL HCL 2.5 MG/ML IV SOLN
INTRAVENOUS | Status: AC
  Filled 2022-08-10: qty 2

## 2022-08-10 MED ORDER — HEPARIN SODIUM (PORCINE) 1000 UNIT/ML IJ SOLN
INTRAMUSCULAR | Status: DC | PRN
  Administered 2022-08-10: 4500 [IU] via INTRAVENOUS

## 2022-08-10 MED ORDER — HEPARIN SODIUM (PORCINE) 1000 UNIT/ML IJ SOLN
INTRAMUSCULAR | Status: AC
  Filled 2022-08-10: qty 10

## 2022-08-10 MED ORDER — LABETALOL HCL 5 MG/ML IV SOLN: 10.0000 mg | INTRAVENOUS | Status: AC | PRN

## 2022-08-10 MED ORDER — VERAPAMIL HCL 2.5 MG/ML IV SOLN
INTRAVENOUS | Status: DC | PRN
  Administered 2022-08-10: 10 mL via INTRA_ARTERIAL

## 2022-08-10 SURGICAL SUPPLY — 15 items
CATH 5FR JL3.5 JR4 ANG PIG MP (CATHETERS) IMPLANT
CATH BALLN WEDGE 5F 110CM (CATHETERS) IMPLANT
CATH SWAN GANZ 7F STRAIGHT (CATHETERS) IMPLANT
DEVICE RAD COMP TR BAND LRG (VASCULAR PRODUCTS) IMPLANT
GLIDESHEATH SLEND SS 6F .021 (SHEATH) IMPLANT
GUIDEWIRE .025 260CM (WIRE) IMPLANT
GUIDEWIRE INQWIRE 1.5J.035X260 (WIRE) IMPLANT
INQWIRE 1.5J .035X260CM (WIRE) ×1
KIT MICROPUNCTURE NIT STIFF (SHEATH) IMPLANT
PACK CARDIAC CATHETERIZATION (CUSTOM PROCEDURE TRAY) ×1 IMPLANT
SHEATH GLIDE SLENDER 4/5FR (SHEATH) IMPLANT
SHEATH PINNACLE 5F 10CM (SHEATH) IMPLANT
SHEATH PINNACLE 7F 10CM (SHEATH) IMPLANT
TRANSDUCER W/STOPCOCK (MISCELLANEOUS) ×1 IMPLANT
WIRE EMERALD 3MM-J .035X150CM (WIRE) IMPLANT

## 2022-08-10 NOTE — Progress Notes (Addendum)
MCS EDUCATION NOTE:                VAD evaluation consent reviewed and signed by patient Austin [name]. Initial VAD teaching completed with patient.   VAD educational packet including "Understanding Your Options with Advanced Heart Failure", "[place] Patient Agreement for VAD Evaluation and Potential Implantation" consent, and [employer] "Heartmate 3 Left Ventricular Device (LVAD) Patient Guide", Heartmate 3 Left Ventricular Assist System Patient Education Program DVD", "[place] HM III Patient Education", "[place] Mechanical Circulatory Support Program", and "Decision Aids for Left Ventricular Assist Device" reviewed in detail and left at bedside for continued reference.  All questions answered regarding VAD implant, hospital stay, and what to expect when discharged home living with a heart pump. Pt states he has a close friend that he believes would agree to be his caregiver and will discuss further with her. He also has three sibling that he is close with; however, they live out of town. Explained need for 24/7 care when pt is discharged home due to sternal precautions, adaptation to living on support, emotional support, consistent and meticulous exit site care and management, medication adherence and high volume of follow up visits with the VAD Clinic after discharge; patient verbalized understanding of above.   Explained that LVAD can be implanted for two indications in the setting of advanced left ventricular heart failure treatment:  Bridge to transplant - used for patients who cannot safely wait for heart transplant without this device.  Or    Destination therapy - used for patients until end of life or recovery of heart function.  Patient acknowledges that the indication at this point in time for LVAD therapy would be for destination therapy due to age.   Reviewed and supplied a copy of home inspection check list stressing that only three pronged grounded power outlets can be used  for VAD equipment. Patient confirmed home has electrical outlets that will support the equipment along with access working telephone.  Identified the following lifestyle modifications while living on MCS:    1. No driving for at least three months and then only if doctor gives permission to do so.   2. No tub baths while pump implanted, and shower only when doctor gives permission.   3. No swimming or submersion in water while implanted with pump.   4. No contact sports or engaging in jumping activities.   5. Always have a backup controller, charged spare batteries, and battery clips nearby at all times in case of emergency.   6. Call the doctor or hospital contact person if any change in how the pump sounds, feels, or works.   7. Plan to sleep only when connected to the power module.   8. Do not sleep on your stomach.   9. Keep a backup system controller, charged batteries, battery clips, and flashlight near you during sleep in case of electrical power outage.   10. Exit site care including dressing changes, monitoring for infection, and importance of keeping percutaneous lead stabilized at all times.   Reviewed pictures of VAD drive line, site care, dressing changes, and drive line stabilization including securement attachment device and abdominal binder. Discussed with pt and family that they will be required to purchase dressing supplies as long as patient has the VAD in place.   Reinforced need for 24 hour/7 day week caregivers.Pt to talk to friend about caregiver role and will ask to come to hospital for initial education. Patient will also need to  abide by sternal precautions with no lifting >10lbs, pushing, pulling and will need assistance with adapting to new life style with VAD equipment and care.   Intermacs patient survival statistics through December 2023 reviewed with patient and caregiver as follows:    The patient understands that from this discussion it does not mean that they  will receive the device, but that depends on an extensive evaluation process. The patient is aware of the fact that if at anytime they want to stop the evaluation process they can.  All questions have been answered at this time and contact information was provided should they encounter any further questions. Patient agreeable at this time to the evaluation process and will move forward.   VAD Coordinator will return tomorrow to show patient equipment and will arrange for one of our current patients and his wife to visit with patient tomorrow to answer questions about living on and caring for someone on MCS.   Simmie Davies RN,BSN VAD Coordinator   Office: [phone] 24/7 VAD Pager: [phone]

## 2022-08-10 NOTE — Progress Notes (Signed)
Daily Progress Note Intern Pager: [phone]  Patient name: Austin [name] Medical record number: [id number] Date of birth: 1955/06/21 Age: 67 y.o. Gender: male  Primary Care Provider: [name], NP Consultants: Cardiology, Heart Failure Code Status: Full  Pt Overview and Major Events to Date:  -Admitted 08/08/22  Assessment and Plan: Austin [name] is a 67 y.o. male admitted for acute on chronic HFrEF exacerbation. Pertinent PMH/PSH includes HTN, CHF s/p ICD, HLD, CKD3.    Suspect patient's cardiac status chronically worsening.  Persistent atrial fibrillation may be contributing to current hospitalization, cardiology planning for cardioversion.  Will see if cardioversion benefits patient.  Hospital Problem List      Hospital     * (Principal) Acute exacerbation of CHF (congestive heart failure)  (HCC)     I/O -5.2. Heart failure team concerned for low output heart failure.  CO-OX suggestive of poor perfusion. Abdominal pain possible chronic  mesenteric ischemia due to poor perfusion. -Heart failure consulted, recs appreciated  -RHC/LHC today, potential cardioversion  -Stopping all GDMT due to low BP  -Lasix 80mg  mg twice a day        Anemia     Labs consistent with iron deficiency anemia. Patient is taking Eliquis  as below. Hgb stable 8.7. -AM CBC -Replete Iron, B12 -GI consult? -S/p IV iron x2 -Transfuse Hgb <8        Atrial fibrillation (HCC)     Heart rate controlled 80-90s on exam today.  Many PVCs. Currently in  A-fib. -Stop Coreg -Continue Eliquis -Cardioversion this week -Amio gtt        Elevated serum creatinine     Cr 1.44 stable. CKD Stage 3. Per Care Everywhere, baseline 1-1.20.        HFrEF (heart failure with reduced ejection fraction) (HCC)    FEN/GI: NPO PPx: Eliquis Dispo:Pending clinical improvement.  Subjective:  PICC placed. NAEO. Feels belly pain has improved with Bentyl.   Objective: Temp:  [97.9 F (36.6  C)-98.9 F (37.2 C)] 97.9 F (36.6 C) (06/26 0724) Pulse Rate:  [76-98] 84 (06/26 1137) Resp:  [16-20] 17 (06/26 1137) BP: (88-112)/(63-78) 112/78 (06/26 0724) SpO2:  [9 %-100 %] 100 % (06/26 1137) Weight:  [95.8 kg] 95.8 kg (06/26 0606) Physical Exam: General: NAD Neuro: A&O Cardiovascular: irregular rate and rhythm, no murmurs, no peripheral edema Respiratory: normal WOB on RA, CTAB, no wheezes, ronchi or rales Abdomen: soft, NTTP, no rebound or guarding Extremities: Moving all 4 extremities equally, feet cool, radial pulse 2+   Laboratory: Most recent CBC Lab Results  Component Value Date   WBC 10.7 (H) 08/10/2022   HGB 8.7 (L) 08/10/2022   HCT 30.2 (L) 08/10/2022   MCV 78.0 (L) 08/10/2022   PLT 196 08/10/2022   Most recent BMP    Latest Ref Rng & Units 08/10/2022    4:45 AM  BMP  Glucose 70 - 99 mg/dL 147   BUN 8 - 23 mg/dL 21   Creatinine 8.29 - 1.24 mg/dL 5.62   Sodium 130 - 865 mmol/L 138   Potassium 3.5 - 5.1 mmol/L 4.3   Chloride 98 - 111 mmol/L 106   CO2 22 - 32 mmol/L 21   Calcium 8.9 - 10.3 mg/dL 9.0    Mag - 2.2  Imaging/Diagnostic Tests: CXR - Right-sided PICC with tip over the central SVC. No pneumothorax.   Celine Mans, MD 08/10/2022, 11:38 AM  PGY-1, [facility] Family Medicine FPTS Intern pager: [phone], text  pages welcome Secure chat group [place]

## 2022-08-10 NOTE — Assessment & Plan Note (Signed)
Cr 1.44 stable. CKD Stage 3. Per Care Everywhere, baseline 1-1.20.

## 2022-08-10 NOTE — Progress Notes (Addendum)
Advanced Heart Failure Rounding Note  PCP-Cardiologist: None   Subjective:    6/25 Started on amio drip. PICC placed  CO-OX 38%-->43%   Complaining of fatigue. SOB exertion.   Objective:   Weight Range: 95.8 kg Body mass index is 25.71 kg/m.   Vital Signs:   Temp:  [97.9 F (36.6 C)-98.9 F (37.2 C)] 98.9 F (37.2 C) (06/25 2326) Pulse Rate:  [76-106] 76 (06/25 2326) Resp:  [13-20] 17 (06/25 2326) BP: (88-119)/(63-91) 93/69 (06/25 2326) SpO2:  [9 %-100 %] 100 % (06/25 2326) Weight:  [94.4 kg-95.8 kg] 95.8 kg (06/26 0606) Last BM Date : 08/07/22  Weight change: Filed Weights   08/08/22 0614 08/09/22 0707 08/10/22 0606  Weight: 93.2 kg 94.4 kg 95.8 kg    Intake/Output:   Intake/Output Summary (Last 24 hours) at 08/10/2022 0653 Last data filed at 08/10/2022 0300 Gross per 24 hour  Intake 224.48 ml  Output 600 ml  Net -375.52 ml    CVP 10-11   Physical Exam    General:   Dyspneic with exertion.  HEENT: Normal Neck: Supple. JVP 10-11  . Carotids 2+ bilat; no bruits. No lymphadenopathy or thyromegaly appreciated. Cor: PMI nondisplaced. Irregular rate & rhythm. No rubs, gallops or murmurs. Lungs: Coarse/EW on room air.  Abdomen: Soft, nontender, nondistended. No hepatosplenomegaly. No bruits or masses. Good bowel sounds. Extremities: No cyanosis, clubbing, rash, edema. RUE PICC Neuro: Alert & orientedx3, cranial nerves grossly intact. moves all 4 extremities w/o difficulty. Affect pleasant   Telemetry   A fib with occasional PVC ~ 30 per minute  80-100s  EKG    N/A   Labs    CBC Recent Labs    08/09/22 0006 08/10/22 0445  WBC 8.3 10.7*  HGB 8.9* 8.7*  HCT 30.0* 30.2*  MCV 76.9* 78.0*  PLT 203 196   Basic Metabolic Panel Recent Labs    40/98/11 0006 08/10/22 0445  NA 138 138  K 3.6 4.3  CL 108 106  CO2 23 21*  GLUCOSE 121* 117*  BUN 17 21  CREATININE 1.37* 1.44*  CALCIUM 8.6* 9.0  MG 2.1 2.2   Liver Function Tests Recent Labs     08/07/22 0912 08/08/22 0016  AST 52* 39  ALT 37 46*  ALKPHOS 60 62  BILITOT 2.7* 2.9*  PROT 5.5* 5.3*  ALBUMIN 3.4* 3.3*   No results for input(s): "LIPASE", "AMYLASE" in the last 72 hours. Cardiac Enzymes No results for input(s): "CKTOTAL", "CKMB", "CKMBINDEX", "TROPONINI" in the last 72 hours.  BNP: BNP (last 3 results) Recent Labs    08/07/22 0719  BNP 872.8*    ProBNP (last 3 results) No results for input(s): "PROBNP" in the last 8760 hours.   D-Dimer No results for input(s): "DDIMER" in the last 72 hours. Hemoglobin A1C No results for input(s): "HGBA1C" in the last 72 hours. Fasting Lipid Panel No results for input(s): "CHOL", "HDL", "LDLCALC", "TRIG", "CHOLHDL", "LDLDIRECT" in the last 72 hours. Thyroid Function Tests No results for input(s): "TSH", "T4TOTAL", "T3FREE", "THYROIDAB" in the last 72 hours.  Invalid input(s): "FREET3"  Other results:   Imaging    DG CHEST PORT 1 VIEW  Result Date: 08/09/2022 CLINICAL DATA:  Status post PICC placement. EXAM: PORTABLE CHEST 1 VIEW COMPARISON:  Chest radiograph dated 08/07/2022. FINDINGS: Right-sided PICC with tip over the central SVC close to the cavoatrial junction. No focal consolidation, pleural effusion, or pneumothorax. Stable cardiomegaly. Left pectoral pacemaker device. No acute osseous pathology. IMPRESSION: Right-sided PICC  with tip over the central SVC. No pneumothorax. Electronically Signed   By: Elgie Collard M.D.   On: 08/09/2022 21:24   [place] EKG SITE RITE  Result Date: 08/09/2022 If Site Rite image not attached, placement could not be confirmed due to current cardiac rhythm.    Medications:     Scheduled Medications:  apixaban  5 mg Oral BID   carvedilol  3.125 mg Oral BID WC   Chlorhexidine Gluconate Cloth  6 each Topical Daily   vitamin B-12  1,000 mcg Oral Daily   dicyclomine  10 mg Oral BID WC   digoxin  0.125 mg Oral Daily   empagliflozin  10 mg Oral Daily   finasteride  5 mg Oral  Daily   melatonin  3 mg Oral QHS   pantoprazole  40 mg Oral Daily   polyethylene glycol  17 g Oral Daily   pravastatin  40 mg Oral Daily   sodium chloride flush  10-40 mL Intracatheter Q12H   sodium chloride flush  3 mL Intravenous Q12H   tamsulosin  0.4 mg Oral QPC supper    Infusions:  amiodarone 30 mg/hr (08/09/22 2241)    PRN Medications: albuterol, sodium chloride flush    Patient Profile  Mr Vanderloop is 67 year old with a history of chronic HFrEF, HTN, HLD, nonobstructive CAD 2012, CKD Stage III, PAF, and single chamber Biotronik .   Admitted with A/C HFrEF + A fib.   Assessment/Plan   1. A/C HFrEF, NICM  Initially diagnosed with HFrEF in 2012. EF previously  improved to 50-55% on medications. He had been lost to follow up in the HF clinic at [facility] and had been followed at [facility]. Most recent cath 2015 showed nonobstructive LCX OM 20%.  In 2022 EF went back down 25-30% and has continued to drop. EF now < 20%. He does have single chamber Biotronic.  Over the last 6 months he has had multiple heart failure admissions.  Admission presentation concerning for low output heart failure with abd discomfort. On exam he is cool and dry.   It is possible cardiomyopathy is from high PVC burden.  - PICC placed CO-OX 38%>43%. Will hold off on TEE/Cardioversion. Set up for RHC/LHC today. NPO.  - Stop coreg with low output.  - CVP 10-11. Start lasix 80 mg twice a day. Adjust post cath.  -Dig level 0.9. Dig was cut back 0.125 mcg.  - No room for GDMTdue to  hypotension.  Concerning he may need advanced therapies-->LVAD   Eventually may need CMRI but he had IV Iron this admit which could interfere with results.  - Cath discussed.  The patient understands that risks included but are not limited to stroke (1 in 1000), death (1 in 1000), kidney failure [usually temporary] (1 in 500), bleeding (1 in 200), allergic reaction [possibly serious] (1 in 200).  The patient understands and  agrees to proceed.   2 A fib  Last in SR back in March of this year. Back in A fib in May and on admit.  -Stop coreg as noted above. Continue to load amio.  - Set up for TEE/DC-CV later this week.  - Load with IV amio . TEE DC-CV tomorrow.   -Continue eliquis 5 mg twice a day.  -Will need to try and restore SR.    3. CKD Stage IIIa -Creatinine baseline ~1.  -Creatinine 1.4 today. Follow BMET.    4. Anemia, IDA -Iron sats 2%.  -Given IV  iron.   5. RV Lead ? 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  However, need to rule out vegetation. WBCs 10. 7. But afebrile.  -He will have TEE with DCCV later this week.to assess.   - Send blood cultures.   6. PVCs High PVC burden. ~ 50% ! On amio drip as above. Hopefully this will help suppress.   Cath today. Possible TEE/DC-CV Friday.   Amy Clegg NP-C  7:17 AM   Agree with above   C/o ab pain and SOB. Remains in AF with very frequent PVCs. Co-ox 38% -> 43%   General:  Sitting in bed. SOB with conversation HEENT: normal Neck: supple. JVP to jaw Carotids 2+ bilat; no bruits. No lymphadenopathy or thryomegaly appreciated. Cor: PMI laterally displaced. Irregular rate & rhythm2/6 MR Lungs: clear Abdomen: soft, nontender, nondistended. No hepatosplenomegaly. No bruits or masses. Good bowel sounds. Extremities: no cyanosis, clubbing, rash, edema + cool  Neuro: alert & orientedx3, cranial nerves grossly intact. moves all 4 extremities w/o difficulty. Affect pleasant   He has low output HF. EF markedly decreased and spherical. Now end-stage. AF is rate controlled so doubt tachy CM but has very frequent PVCs and I awonder if this may be the cause of his CM (versus the result). Will defere TEE/DC-CV today. Plan R/L cath followed by inotropic support. Continue amio.   Once co-ox stable and amio loaded will proceed with TEE/DC-CV (likely Friday). Will start VAD w/u as I am not overly  optimistic EF will improve with PVC suppression.    CRITICAL CARE Performed by: Arvilla Meres  Total critical care time: 45 minutes  Critical care time was exclusive of separately billable procedures and treating other patients.  Critical care was necessary to treat or prevent imminent or life-threatening deterioration.  Critical care was time spent personally by me (independent of midlevel providers or residents) on the following activities: development of treatment plan with patient and/or surrogate as well as nursing, discussions with consultants, evaluation of patient's response to treatment, examination of patient, obtaining history from patient or surrogate, ordering and performing treatments and interventions, ordering and review of laboratory studies, ordering and review of radiographic studies, pulse oximetry and re-evaluation of patient's condition.  Arvilla Meres, MD  10:12 AM

## 2022-08-10 NOTE — Progress Notes (Signed)
ANTICOAGULATION CONSULT NOTE - Initial Consult  Pharmacy Consult for Heparin while Eliquis on hold Indication: atrial fibrillation  No Known Allergies  Patient Measurements: Height: 6\' 4"  (193 cm) Weight: 95.8 kg (211 lb 3.2 oz) IBW/kg (Calculated) : 86.8 Heparin Dosing Weight: total   Vital Signs: Temp: 97.9 F (36.6 C) (06/26 1546) Temp Source: Oral (06/26 1546) BP: 99/67 (06/26 1546) Pulse Rate: 73 (06/26 1546)  Labs: Recent Labs    08/08/22 0016 08/09/22 0006 08/10/22 0445 08/10/22 1421 08/10/22 1446 08/10/22 1447  HGB 9.1* 8.9* 8.7* 10.2* 10.2* 10.2*  HCT 31.1* 30.0* 30.2* 30.0* 30.0* 30.0*  PLT 198 203 196  --   --   --   CREATININE 1.45* 1.37* 1.44*  --   --   --     Estimated Creatinine Clearance: 60.3 mL/min (A) (by C-G formula based on SCr of 1.44 mg/dL (H)).   Medical History: Past Medical History:  Diagnosis Date   Benign prostatic hypertrophy    Hyperlipidemia    Nonischemic cardiomyopathy [facility]) Oct 2012   cath 12/10/10 minimal nonobstructive coronary artery disease   Systolic heart failure Oct 2012   echo 12/08/10 EF 20%, severe left ventricular enlargement, mild right ventricular enlargement    Medications:  Scheduled:   Chlorhexidine Gluconate Cloth  6 each Topical Daily   vitamin B-12  1,000 mcg Oral Daily   dicyclomine  10 mg Oral BID WC   digoxin  0.125 mg Oral Daily   empagliflozin  10 mg Oral Daily   finasteride  5 mg Oral Daily   furosemide  80 mg Intravenous BID   melatonin  3 mg Oral QHS   pantoprazole  40 mg Oral Daily   polyethylene glycol  17 g Oral Daily   pravastatin  40 mg Oral Daily   sodium chloride flush  10-40 mL Intracatheter Q12H   sodium chloride flush  3 mL Intravenous Q12H   sodium chloride flush  3 mL Intravenous Q12H   tamsulosin  0.4 mg Oral QPC supper   Infusions:   sodium chloride     amiodarone 60 mg/hr (08/10/22 1541)   PRN: sodium chloride, acetaminophen, albuterol, hydrALAZINE, labetalol,  ondansetron (ZOFRAN) IV, sodium chloride flush, sodium chloride flush  Assessment: 67 yo male on chronic eliquis for hx afib admitted with A/C HFrEF and afib. Pharmacy consulted to dose IV heparin, to begin 2hr after TR band removal. Will monitor using aPTT since heparin levels will be falsely elevated with recent eliquis use.  Goal of Therapy:  aPTT 66-102 sec Heparin level 0.3-0.7 units/ml Monitor platelets by anticoagulation protocol: Yes   Plan:  At 19:30, begin heparin (no bolus) 1200 units/hr Check heparin level and aPTT in 8hrs at 03:30 on 6/27 Daily heparin level and aPTT until both correlate, then can monitor using heparin levels only Daily CBC, monitor for signs/symptoms of bleeding  Loralee Pacas, PharmD, BCPS 08/10/2022,6:09 PM  Please check AMION for all [facility] Pharmacy phone numbers After 10:00 PM, call Main Pharmacy [phone]

## 2022-08-10 NOTE — Progress Notes (Signed)
FMTS Interim Progress Note  S: To bedside for postop check.  Patient feeling well, denies pain or any SOB.  Understands plan for likely TEE/DCCV on Friday.  O: BP 99/67 (BP Location: Right Arm)   Pulse 73   Temp 97.9 F (36.6 C) (Oral)   Resp 13   Ht 6\' 4"  (1.93 m)   Wt 95.8 kg   SpO2 100%   BMI 25.71 kg/m    Pt NAD awake and pleasant, conversational. No resp distress. Coarse breath sounds b/l. On room air.  Appreciate cardiology, plan per today's prog note  Vonna Drafts, MD 08/10/2022, 6:05 PM PGY-1, [facility] Family Medicine Service pager [phone]

## 2022-08-10 NOTE — Assessment & Plan Note (Addendum)
Labs consistent with iron deficiency anemia. Patient is taking Eliquis as below. Hgb stable 8.7. -AM CBC -Replete Iron, B12 -GI consult? -S/p IV iron x2 -Transfuse Hgb <8

## 2022-08-10 NOTE — H&P (View-Only) (Signed)
  Advanced Heart Failure Rounding Note  PCP-Cardiologist: None   Subjective:    6/25 Started on amio drip. PICC placed  CO-OX 38%-->43%   Complaining of fatigue. SOB exertion.   Objective:   Weight Range: 95.8 kg Body mass index is 25.71 kg/m.   Vital Signs:   Temp:  [97.9 F (36.6 C)-98.9 F (37.2 C)] 98.9 F (37.2 C) (06/25 2326) Pulse Rate:  [76-106] 76 (06/25 2326) Resp:  [13-20] 17 (06/25 2326) BP: (88-119)/(63-91) 93/69 (06/25 2326) SpO2:  [9 %-100 %] 100 % (06/25 2326) Weight:  [94.4 kg-95.8 kg] 95.8 kg (06/26 0606) Last BM Date : 08/07/22  Weight change: Filed Weights   08/08/22 0614 08/09/22 0707 08/10/22 0606  Weight: 93.2 kg 94.4 kg 95.8 kg    Intake/Output:   Intake/Output Summary (Last 24 hours) at 08/10/2022 0653 Last data filed at 08/10/2022 0300 Gross per 24 hour  Intake 224.48 ml  Output 600 ml  Net -375.52 ml    CVP 10-11   Physical Exam    General:   Dyspneic with exertion.  HEENT: Normal Neck: Supple. JVP 10-11  . Carotids 2+ bilat; no bruits. No lymphadenopathy or thyromegaly appreciated. Cor: PMI nondisplaced. Irregular rate & rhythm. No rubs, gallops or murmurs. Lungs: Coarse/EW on room air.  Abdomen: Soft, nontender, nondistended. No hepatosplenomegaly. No bruits or masses. Good bowel sounds. Extremities: No cyanosis, clubbing, rash, edema. RUE PICC Neuro: Alert & orientedx3, cranial nerves grossly intact. moves all 4 extremities w/o difficulty. Affect pleasant   Telemetry   A fib with occasional PVC ~ 30 per minute  80-100s  EKG    N/A   Labs    CBC Recent Labs    08/09/22 0006 08/10/22 0445  WBC 8.3 10.7*  HGB 8.9* 8.7*  HCT 30.0* 30.2*  MCV 76.9* 78.0*  PLT 203 196   Basic Metabolic Panel Recent Labs    08/09/22 0006 08/10/22 0445  NA 138 138  K 3.6 4.3  CL 108 106  CO2 23 21*  GLUCOSE 121* 117*  BUN 17 21  CREATININE 1.37* 1.44*  CALCIUM 8.6* 9.0  MG 2.1 2.2   Liver Function Tests Recent Labs     08/07/22 0912 08/08/22 0016  AST 52* 39  ALT 37 46*  ALKPHOS 60 62  BILITOT 2.7* 2.9*  PROT 5.5* 5.3*  ALBUMIN 3.4* 3.3*   No results for input(s): "LIPASE", "AMYLASE" in the last 72 hours. Cardiac Enzymes No results for input(s): "CKTOTAL", "CKMB", "CKMBINDEX", "TROPONINI" in the last 72 hours.  BNP: BNP (last 3 results) Recent Labs    08/07/22 0719  BNP 872.8*    ProBNP (last 3 results) No results for input(s): "PROBNP" in the last 8760 hours.   D-Dimer No results for input(s): "DDIMER" in the last 72 hours. Hemoglobin A1C No results for input(s): "HGBA1C" in the last 72 hours. Fasting Lipid Panel No results for input(s): "CHOL", "HDL", "LDLCALC", "TRIG", "CHOLHDL", "LDLDIRECT" in the last 72 hours. Thyroid Function Tests No results for input(s): "TSH", "T4TOTAL", "T3FREE", "THYROIDAB" in the last 72 hours.  Invalid input(s): "FREET3"  Other results:   Imaging    DG CHEST PORT 1 VIEW  Result Date: 08/09/2022 CLINICAL DATA:  Status post PICC placement. EXAM: PORTABLE CHEST 1 VIEW COMPARISON:  Chest radiograph dated 08/07/2022. FINDINGS: Right-sided PICC with tip over the central SVC close to the cavoatrial junction. No focal consolidation, pleural effusion, or pneumothorax. Stable cardiomegaly. Left pectoral pacemaker device. No acute osseous pathology. IMPRESSION: Right-sided PICC   with tip over the central SVC. No pneumothorax. Electronically Signed   By: Arash  Radparvar M.D.   On: 08/09/2022 21:24   US EKG SITE RITE  Result Date: 08/09/2022 If Site Rite image not attached, placement could not be confirmed due to current cardiac rhythm.    Medications:     Scheduled Medications:  apixaban  5 mg Oral BID   carvedilol  3.125 mg Oral BID WC   Chlorhexidine Gluconate Cloth  6 each Topical Daily   vitamin B-12  1,000 mcg Oral Daily   dicyclomine  10 mg Oral BID WC   digoxin  0.125 mg Oral Daily   empagliflozin  10 mg Oral Daily   finasteride  5 mg Oral  Daily   melatonin  3 mg Oral QHS   pantoprazole  40 mg Oral Daily   polyethylene glycol  17 g Oral Daily   pravastatin  40 mg Oral Daily   sodium chloride flush  10-40 mL Intracatheter Q12H   sodium chloride flush  3 mL Intravenous Q12H   tamsulosin  0.4 mg Oral QPC supper    Infusions:  amiodarone 30 mg/hr (08/09/22 2241)    PRN Medications: albuterol, sodium chloride flush    Patient Profile  Mr Duesing is 66 year old with a history of chronic HFrEF, HTN, HLD, nonobstructive CAD 2012, CKD Stage III, PAF, and single chamber Biotronik .   Admitted with A/C HFrEF + A fib.   Assessment/Plan   1. A/C HFrEF, NICM  Initially diagnosed with HFrEF in 2012. EF previously  improved to 50-55% on medications. He had been lost to follow up in the HF clinic at [facility] and had been followed at [facility]. Most recent cath 2015 showed nonobstructive LCX OM 20%.  In 2022 EF went back down 25-30% and has continued to drop. EF now < 20%. He does have single chamber Biotronic.  Over the last 6 months he has had multiple heart failure admissions.  Admission presentation concerning for low output heart failure with abd discomfort. On exam he is cool and dry.   It is possible cardiomyopathy is from high PVC burden.  - PICC placed CO-OX 38%>43%. Will hold off on TEE/Cardioversion. Set up for RHC/LHC today. NPO.  - Stop coreg with low output.  - CVP 10-11. Start lasix 80 mg twice a day. Adjust post cath.  -Dig level 0.9. Dig was cut back 0.125 mcg.  - No room for GDMTdue to  hypotension.  Concerning he may need advanced therapies-->LVAD   Eventually may need CMRI but he had IV Iron this admit which could interfere with results.  - Cath discussed.  The patient understands that risks included but are not limited to stroke (1 in 1000), death (1 in 1000), kidney failure [usually temporary] (1 in 500), bleeding (1 in 200), allergic reaction [possibly serious] (1 in 200).  The patient understands and  agrees to proceed.   2 A fib  Last in SR back in March of this year. Back in A fib in May and on admit.  -Stop coreg as noted above. Continue to load amio.  - Set up for TEE/DC-CV later this week.  - Load with IV amio . TEE DC-CV tomorrow.   -Continue eliquis 5 mg twice a day.  -Will need to try and restore SR.    3. CKD Stage IIIa -Creatinine baseline ~1.  -Creatinine 1.4 today. Follow BMET.    4. Anemia, IDA -Iron sats 2%.  -Given IV   iron.   5. RV Lead ? 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  However, need to rule out vegetation. WBCs 10. 7. But afebrile.  -He will have TEE with DCCV later this week.to assess.   - Send blood cultures.   6. PVCs High PVC burden. ~ 50% ! On amio drip as above. Hopefully this will help suppress.   Cath today. Possible TEE/DC-CV Friday.   Amy Clegg NP-C  7:17 AM   Agree with above   C/o ab pain and SOB. Remains in AF with very frequent PVCs. Co-ox 38% -> 43%   General:  Sitting in bed. SOB with conversation HEENT: normal Neck: supple. JVP to jaw Carotids 2+ bilat; no bruits. No lymphadenopathy or thryomegaly appreciated. Cor: PMI laterally displaced. Irregular rate & rhythm2/6 MR Lungs: clear Abdomen: soft, nontender, nondistended. No hepatosplenomegaly. No bruits or masses. Good bowel sounds. Extremities: no cyanosis, clubbing, rash, edema + cool  Neuro: alert & orientedx3, cranial nerves grossly intact. moves all 4 extremities w/o difficulty. Affect pleasant   He has low output HF. EF markedly decreased and spherical. Now end-stage. AF is rate controlled so doubt tachy CM but has very frequent PVCs and I awonder if this may be the cause of his CM (versus the result). Will defere TEE/DC-CV today. Plan R/L cath followed by inotropic support. Continue amio.   Once co-ox stable and amio loaded will proceed with TEE/DC-CV (likely Friday). Will start VAD w/u as I am not overly  optimistic EF will improve with PVC suppression.    CRITICAL CARE Performed by: Zayveon Raschke  Total critical care time: 45 minutes  Critical care time was exclusive of separately billable procedures and treating other patients.  Critical care was necessary to treat or prevent imminent or life-threatening deterioration.  Critical care was time spent personally by me (independent of midlevel providers or residents) on the following activities: development of treatment plan with patient and/or surrogate as well as nursing, discussions with consultants, evaluation of patient's response to treatment, examination of patient, obtaining history from patient or surrogate, ordering and performing treatments and interventions, ordering and review of laboratory studies, ordering and review of radiographic studies, pulse oximetry and re-evaluation of patient's condition.  Javyn Havlin, MD  10:12 AM    

## 2022-08-10 NOTE — Assessment & Plan Note (Addendum)
Heart rate controlled 80-90s on exam today.  Many PVCs. Currently in A-fib. -Stop Coreg -Continue Eliquis -Cardioversion this week -Amio gtt 

## 2022-08-10 NOTE — Assessment & Plan Note (Addendum)
I/O -5.2. Heart failure team concerned for low output heart failure. CO-OX suggestive of poor perfusion. Abdominal pain possible chronic mesenteric ischemia due to poor perfusion. -Heart failure consulted, recs appreciated  -RHC/LHC today, potential cardioversion  -Stopping all GDMT due to low BP  -Lasix 80mg  mg twice a day

## 2022-08-10 NOTE — Interval H&P Note (Signed)
History and Physical Interval Note:  08/10/2022 2:04 PM  Austin [name]  has presented today for surgery, with the diagnosis of heart failure.  The various methods of treatment have been discussed with the patient and family. After consideration of risks, benefits and other options for treatment, the patient has consented to  Procedure(s): RIGHT/LEFT HEART CATH AND CORONARY ANGIOGRAPHY (N/A) as a surgical intervention.  The patient's history has been reviewed, patient examined, no change in status, stable for surgery.  I have reviewed the patient's chart and labs.  Questions were answered to the patient's satisfaction.     Raedyn Klinck

## 2022-08-11 ENCOUNTER — Encounter (HOSPITAL_COMMUNITY): Payer: Self-pay | Admitting: Internal Medicine

## 2022-08-11 ENCOUNTER — Encounter (HOSPITAL_COMMUNITY): Payer: Medicare PPO

## 2022-08-11 ENCOUNTER — Encounter: Payer: Self-pay | Admitting: Registered Nurse

## 2022-08-11 ENCOUNTER — Inpatient Hospital Stay (HOSPITAL_COMMUNITY): Payer: Medicare PPO

## 2022-08-11 DIAGNOSIS — Z01818 Encounter for other preprocedural examination: Secondary | ICD-10-CM

## 2022-08-11 DIAGNOSIS — E440 Moderate protein-calorie malnutrition: Secondary | ICD-10-CM | POA: Insufficient documentation

## 2022-08-11 DIAGNOSIS — I5022 Chronic systolic (congestive) heart failure: Secondary | ICD-10-CM | POA: Diagnosis not present

## 2022-08-11 DIAGNOSIS — I5020 Unspecified systolic (congestive) heart failure: Secondary | ICD-10-CM | POA: Diagnosis not present

## 2022-08-11 DIAGNOSIS — R739 Hyperglycemia, unspecified: Secondary | ICD-10-CM

## 2022-08-11 DIAGNOSIS — I5023 Acute on chronic systolic (congestive) heart failure: Secondary | ICD-10-CM | POA: Diagnosis not present

## 2022-08-11 LAB — HEPATITIS B SURFACE ANTIGEN: Hepatitis B Surface Ag: NONREACTIVE

## 2022-08-11 LAB — CBC
HCT: 31.2 % — ABNORMAL LOW (ref 39.0–52.0)
Hemoglobin: 9.1 g/dL — ABNORMAL LOW (ref 13.0–17.0)
MCH: 22.8 pg — ABNORMAL LOW (ref 26.0–34.0)
MCHC: 29.2 g/dL — ABNORMAL LOW (ref 30.0–36.0)
MCV: 78.2 fL — ABNORMAL LOW (ref 80.0–100.0)
Platelets: 229 10*3/uL (ref 150–400)
RBC: 3.99 MIL/uL — ABNORMAL LOW (ref 4.22–5.81)
RDW: 17.5 % — ABNORMAL HIGH (ref 11.5–15.5)
WBC: 10.2 10*3/uL (ref 4.0–10.5)
nRBC: 0.6 % — ABNORMAL HIGH (ref 0.0–0.2)

## 2022-08-11 LAB — ABO/RH: ABO/RH(D): O POS

## 2022-08-11 LAB — VAS US DOPPLER PRE VAD: Right ABI: ABSENT

## 2022-08-11 LAB — COOXEMETRY PANEL
Carboxyhemoglobin: 1.8 % — ABNORMAL HIGH (ref 0.5–1.5)
Methemoglobin: <0.7 % (ref 0.0–1.5)
O2 Saturation: 50.5 %
Total hemoglobin: 8.3 g/dL — ABNORMAL LOW (ref 12.0–16.0)

## 2022-08-11 LAB — LIPID PANEL
Cholesterol: 156 mg/dL (ref 0–200)
HDL: 35 mg/dL — ABNORMAL LOW (ref >40)
LDL Cholesterol: 100 mg/dL — ABNORMAL HIGH (ref 0–99)
Total CHOL/HDL Ratio: 4.5 RATIO
Triglycerides: 103 mg/dL (ref <150)
VLDL: 21 mg/dL (ref 0–40)

## 2022-08-11 LAB — BASIC METABOLIC PANEL
Anion gap: 14 (ref 5–15)
BUN: 20 mg/dL (ref 8–23)
CO2: 22 mmol/L (ref 22–32)
Calcium: 8.7 mg/dL — ABNORMAL LOW (ref 8.9–10.3)
Chloride: 98 mmol/L (ref 98–111)
Creatinine, Ser: 1.54 mg/dL — ABNORMAL HIGH (ref 0.61–1.24)
GFR, Estimated: 49 mL/min — ABNORMAL LOW (ref >60)
Glucose, Bld: 259 mg/dL — ABNORMAL HIGH (ref 70–99)
Potassium: 3.4 mmol/L — ABNORMAL LOW (ref 3.5–5.1)
Sodium: 134 mmol/L — ABNORMAL LOW (ref 135–145)

## 2022-08-11 LAB — LACTATE DEHYDROGENASE: LDH: 146 U/L (ref 98–192)

## 2022-08-11 LAB — APTT
aPTT: 58 seconds — ABNORMAL HIGH (ref 24–36)
aPTT: 58 seconds — ABNORMAL HIGH (ref 24–36)

## 2022-08-11 LAB — LACTIC ACID, PLASMA
Lactic Acid, Venous: 1.6 mmol/L (ref 0.5–1.9)
Lactic Acid, Venous: 2.3 mmol/L (ref 0.5–1.9)
Lactic Acid, Venous: 1.6 mmol/L (ref 0.5–1.9)

## 2022-08-11 LAB — HEPARIN LEVEL (UNFRACTIONATED): Heparin Unfractionated: >1.1 IU/mL — ABNORMAL HIGH (ref 0.30–0.70)

## 2022-08-11 LAB — HEPATITIS C ANTIBODY: HCV Ab: NONREACTIVE

## 2022-08-11 LAB — HEPATITIS B CORE ANTIBODY, TOTAL: Hep B Core Total Ab: NONREACTIVE

## 2022-08-11 LAB — URIC ACID: Uric Acid, Serum: 7.6 mg/dL (ref 3.7–8.6)

## 2022-08-11 LAB — TYPE AND SCREEN
ABO/RH(D): O POS
Antibody Screen: NEGATIVE

## 2022-08-11 LAB — HIV ANTIBODY (ROUTINE TESTING W REFLEX): HIV Screen 4th Generation wRfx: NONREACTIVE

## 2022-08-11 LAB — TSH: TSH: 2.985 u[IU]/mL (ref 0.350–4.500)

## 2022-08-11 LAB — ANTITHROMBIN III: AntiThromb III Func: 93 % (ref 75–120)

## 2022-08-11 LAB — MAGNESIUM: Magnesium: 2.2 mg/dL (ref 1.7–2.4)

## 2022-08-11 LAB — PREALBUMIN: Prealbumin: 29 mg/dL (ref 18–38)

## 2022-08-11 LAB — CULTURE, BLOOD (ROUTINE X 2): Culture: NO GROWTH

## 2022-08-11 LAB — T4, FREE: Free T4: 1 ng/dL (ref 0.61–1.12)

## 2022-08-11 MED ORDER — POTASSIUM CHLORIDE 10 MEQ/100ML IV SOLN
10.0000 meq | INTRAVENOUS | Status: AC
  Administered 2022-08-11 (×6): 10 meq via INTRAVENOUS
  Filled 2022-08-11 (×6): qty 100

## 2022-08-11 MED ORDER — POTASSIUM CHLORIDE CRYS ER 20 MEQ PO TBCR
40.0000 meq | EXTENDED_RELEASE_TABLET | Freq: Once | ORAL | Status: AC
  Administered 2022-08-11: 40 meq via ORAL
  Filled 2022-08-11: qty 2

## 2022-08-11 MED ORDER — SODIUM CHLORIDE 0.9 % IV SOLN: INTRAVENOUS | Status: DC

## 2022-08-11 MED ORDER — APIXABAN 5 MG PO TABS
5.0000 mg | ORAL_TABLET | Freq: Two times a day (BID) | ORAL | Status: DC
  Administered 2022-08-11 – 2022-08-12 (×3): 5 mg via ORAL
  Filled 2022-08-11 (×3): qty 1

## 2022-08-11 MED ORDER — RENA-VITE PO TABS
1.0000 | ORAL_TABLET | Freq: Every day | ORAL | Status: DC
  Administered 2022-08-11: 1 via ORAL
  Filled 2022-08-11: qty 1

## 2022-08-11 MED ORDER — ADULT MULTIVITAMIN W/MINERALS CH: 1.0000 | ORAL_TABLET | Freq: Every day | ORAL | Status: DC

## 2022-08-11 MED ORDER — ENSURE ENLIVE PO LIQD: 237.0000 mL | Freq: Two times a day (BID) | ORAL | Status: DC

## 2022-08-11 NOTE — Assessment & Plan Note (Signed)
Heart rate controlled 80-90s on exam today.  Many PVCs. Currently in A-fib. -Stop Coreg -Continue Eliquis -Cardioversion this week -Amio gtt

## 2022-08-11 NOTE — Consult Note (Cosign Needed Addendum)
Consultation Note Date: 08/12/2022  Patient Name: Austin [name]  DOB: Jun 29, 1955  MRN: [id number]  Age / Sex: 67 y.o., male  PCP: [name], NP Referring Physician: Doreene Eland, MD  Reason for Consultation: VAD evaluation  HPI/Patient Profile: 67 y.o. male  with past medical history of HFrEF, s/p Biotronik ICD, NICM, atrial fibrillation, HLD, HTN, CKD stage 3a, BPH admitted on 08/07/2022 with abd/chest pain and dyspnea with worsening heart failure with exacerbation. Significant PVCs (~50% of time) improving with amiodarone. Plans for TEE/DC-CV 6/28. Consideration of VAD if EF not improved in 4-6 weeks in the setting of PVC suppression. Palliative care requested for VAD evaluation.   Clinical Assessment and Goals of Care: Consult received and chart reviewed. Discussed with heart failure team yesterday. I attempted to visit with Windy Fast x 2 yesterday but he was initially speaking with heart failure CSW for LVAD evaluation and then he was having ultrasound. I returned this morning and we had a good discussion.   I spent much time discussing with Maliky his underlying heart failure and the relationship of the heart muscle and electrical part of the heart. We discussed the trajectory of heart failure. We discussed the hopes that if we can better control the electrical part of his heart we will have to see if this helps the heart muscle - this will take some time. IF heart muscle remains severely weak then we can consider LVAD.  We spent much time discussing LVAD. We discussed the significance of the surgical procedure, recovery, and social support needed. We reviewed the ongoing care of maintaining LVAD. Serigne is very overwhelmed with the thoughts of his diagnosis and the potential of LVAD. He tells me that he is hoping that he does not need LVAD. However, he does confirm that if LVAD is needed to prolong  his life he would want this. He believes that he has some friends that would help care and stay with him post-op. His brother and sister work and live in [place], [place] so it would be hard for them to support him. Ultimately he wants to live and tells me that he has more he wishes to accomplish and do in life.   I reviewed with Windy Fast HCPOA/Living Will. He shares that he wants his brother, Onalee Hua, and sister to be HCPOA. We reviewed what HCPOA means and that he should share his thoughts and wishes with these people so they can be his voice if needed. He expresses his desire for cremation and to have his ashes spread with the graves of other family members. Family is the most important thing to him - especially his mother who he says was always his hero.   We discussed his wishes for measures to prolong life in certain scenarios. He expresses desire for full code currently. He does endorse he would not want to be kept alive by machines if he were terminally ill or in vegetative state. However, he tells me that he has concerns with his mother's teachings and concerns that  if he does not accept medical interventions to prolong life how this is equated to suicide. We further discussed the difference between suicide and measures to end your life vs the decision to not pursue interventions that may only serve to cause you more pain and suffering at the end of your life. We explored his faith and the belief that we all have a time to die and this will happen when God is ready for him regardless of the medical interventions he desires. He expresses understanding. He would not want to be prolonged in a state where he could not do anything for himself - his independence is very important to him. I encouraged him to continue to think, pray, and discuss with his family and medical team his wishes.   All questions/concerns addressed to the best of my ability. Emotional support provided. Updated LVAD coordinator and CSW.    Primary Decision Maker PATIENT    SUMMARY OF RECOMMENDATIONS   - He would be open to LVAD if needed - Recommend ongoing education and support - Recommend to involve family in conversations so they can support him as well  Code Status/Advance Care Planning: Full code   Symptom Management:  Per heart failure team  Prognosis:  Prognosis poor with worsening heart failure.   Discharge Planning: Home with Home Health      Primary Diagnoses: Present on Admission: **None**   I have reviewed the medical record, interviewed the patient and family, and examined the patient. The following aspects are pertinent.  Past Medical History:  Diagnosis Date   Benign prostatic hypertrophy    Hyperlipidemia    Nonischemic cardiomyopathy [facility]) Oct 2012   cath 12/10/10 minimal nonobstructive coronary artery disease   Systolic heart failure Oct 2012   echo 12/08/10 EF 20%, severe left ventricular enlargement, mild right ventricular enlargement   Social History   Socioeconomic History   Marital status: Single    Spouse name: Not on file   Number of children: Not on file   Years of education: Not on file   Highest education level: Not on file  Occupational History   Not on file  Tobacco Use   Smoking status: Former    Packs/day: 2.00    Years: 14.00    Additional pack years: 0.00    Total pack years: 28.00    Types: Cigarettes    Quit date: 02/14/1993    Years since quitting: 29.5   Smokeless tobacco: Not on file  Substance and Sexual Activity   Alcohol use: Yes    Comment: occasionally, once a month   Drug use: Not on file   Sexual activity: Not on file  Other Topics Concern   Not on file  Social History Narrative   He is single and lives in [place].  He drives a refrigerator truck.   Social Determinants of Health   Financial Resource Strain: Not on file  Food Insecurity: No Food Insecurity (08/07/2022)   Hunger Vital Sign    Worried About Running Out of Food in  the Last Year: Never true    Ran Out of Food in the Last Year: Never true  Transportation Needs: No Transportation Needs (08/07/2022)   PRAPARE - Administrator, Civil Service (Medical): No    Lack of Transportation (Non-Medical): No  Physical Activity: Not on file  Stress: Not on file  Social Connections: Not on file   Family History  Problem Relation Age of Onset   Breast cancer Mother  Cancer Father    Heart attack Brother    Heart attack Brother 2   Scheduled Meds:  apixaban  5 mg Oral BID   Chlorhexidine Gluconate Cloth  6 each Topical Daily   vitamin B-12  1,000 mcg Oral Daily   dicyclomine  10 mg Oral BID WC   digoxin  0.125 mg Oral Daily   empagliflozin  10 mg Oral Daily   finasteride  5 mg Oral Daily   furosemide  80 mg Intravenous BID   melatonin  3 mg Oral QHS   pantoprazole  40 mg Oral Daily   polyethylene glycol  17 g Oral Daily   pravastatin  40 mg Oral Daily   sodium chloride flush  10-40 mL Intracatheter Q12H   sodium chloride flush  3 mL Intravenous Q12H   sodium chloride flush  3 mL Intravenous Q12H   tamsulosin  0.4 mg Oral QPC supper   Continuous Infusions:  sodium chloride     amiodarone 60 mg/hr (08/11/22 1046)   PRN Meds:.sodium chloride, acetaminophen, albuterol, ondansetron (ZOFRAN) IV, sodium chloride flush, sodium chloride flush No Known Allergies Review of Systems  Constitutional:  Positive for activity change and appetite change.  Respiratory:  Positive for shortness of breath.   Neurological:  Positive for weakness.    Physical Exam Constitutional:      General: He is not in acute distress.    Appearance: He is ill-appearing.  Cardiovascular:     Rate and Rhythm: Normal rate and regular rhythm.  Pulmonary:     Effort: No tachypnea, accessory muscle usage or respiratory distress.  Abdominal:     Palpations: Abdomen is soft.  Neurological:     Mental Status: He is alert and oriented to person, place, and time.      Vital Signs: BP 113/78 (BP Location: Left Arm)   Pulse 67   Temp 98.6 F (37 C) (Oral)   Resp (!) 27   Ht 6\' 4"  (1.93 m)   Wt 93.2 kg   SpO2 95%   BMI 25.01 kg/m  Pain Scale: 0-10 POSS *See Group Information*: 1-Acceptable,Awake and alert Pain Score: 7    SpO2: SpO2: 95 % O2 Device:SpO2: 95 % O2 Flow Rate: .   IO: Intake/output summary:  Intake/Output Summary (Last 24 hours) at 08/11/2022 1310 Last data filed at 08/11/2022 1043 Gross per 24 hour  Intake 1103.53 ml  Output 2250 ml  Net -1146.47 ml    LBM: Last BM Date : 08/07/22 Baseline Weight: Weight: 100.7 kg Most recent weight: Weight: 93.2 kg     Palliative Assessment/Data:     Time Total: 100 min  Greater than 50%  of this time was spent counseling and coordinating care related to the above assessment and plan.  Signed by: Yong Channel, NP Palliative Medicine Team Pager # [phone] (M-F 8a-5p) Team Phone # [phone] (Nights/Weekends)

## 2022-08-11 NOTE — Assessment & Plan Note (Addendum)
Cardiac catheterization demonstrating EF 10%. Cardiac output poor. Hopefully cardioversion will improve EF and output, if not planning for LVAD. -Heart failure consulted, recs appreciated  -Cardioversion planned for Friday  -Continue Digoxin, Jardiance  -Lasix 80mg  mg twice a day

## 2022-08-11 NOTE — Progress Notes (Addendum)
Advanced Heart Failure Rounding Note  PCP-Cardiologist: None   Subjective:    6/25 Started on amio drip. PICC placed  CO-OX 38%-->43%->51%   Remains in AF. Rate controlled in 70s. PVCs less frequent.   Breathing and volume status much improved, CVP 7. Sitting up in bed. No current complaints this morning.     R/LHC 6/26  Ao = 85/61 (73) LV = 84/17 RA =  9 RV = 37/11 PA = 34/19 (27) PCW = 20 (v waves to 30) Fick cardiac output/index = 6.1/2.7 Thermo CO/CI = 4.3/1.9 PVR = 1.6 (TD) SVR = 1194 FA sat = 96% PA sat = 53%, 55% LVEF 10% markedly dilated   Assessment: 1. Minimal CAD 2. Severe NICM EF 10% 3. Mildly elevated filling pressures with severely reduced CO by Thermo  Objective:   Weight Range: 93.2 kg Body mass index is 25.01 kg/m.   Vital Signs:   Temp:  [97.9 F (36.6 C)-98.9 F (37.2 C)] 98.6 F (37 C) (06/27 0720) Pulse Rate:  [53-84] 72 (06/27 0720) Resp:  [13-23] 18 (06/27 0720) BP: (89-135)/(41-121) 107/77 (06/27 0720) SpO2:  [82 %-100 %] 100 % (06/27 0720) Weight:  [93.2 kg] 93.2 kg (06/27 0720) Last BM Date : 08/07/22  Weight change: Filed Weights   08/09/22 0707 08/10/22 0606 08/11/22 0720  Weight: 94.4 kg 95.8 kg 93.2 kg    Intake/Output:   Intake/Output Summary (Last 24 hours) at 08/11/2022 0741 Last data filed at 08/11/2022 0700 Gross per 24 hour  Intake 1103.53 ml  Output 4925 ml  Net -3821.47 ml      Physical Exam    CVP 7  General:  Well appearing. No respiratory difficulty HEENT: normal Neck: supple.  JVD 7 cm. Carotids 2+ bilat; no bruits. No lymphadenopathy or thyromegaly appreciated. Cor: PMI nondisplaced. Irregularly irregular rhythm and rate. No rubs, gallops or murmurs. Lungs: clear Abdomen: soft, nontender, nondistended. No hepatosplenomegaly. No bruits or masses. Good bowel sounds. Extremities: no cyanosis, clubbing, rash, edema Neuro: alert & oriented x 3, cranial nerves grossly intact. moves all 4  extremities w/o difficulty. Affect pleasant.    Telemetry   A fib with occasional PVCs ~7 per minute. HR 70s   EKG    N/A   Labs    CBC Recent Labs    08/10/22 0445 08/10/22 1421 08/10/22 1447 08/11/22 0001  WBC 10.7*  --   --  10.2  HGB 8.7*   < > 10.2* 9.1*  HCT 30.2*   < > 30.0* 31.2*  MCV 78.0*  --   --  78.2*  PLT 196  --   --  229   < > = values in this interval not displayed.   Basic Metabolic Panel Recent Labs    16/10/96 0445 08/10/22 1421 08/10/22 1447 08/11/22 0001  NA 138   < > 140 134*  K 4.3   < > 3.7 3.4*  CL 106  --   --  98  CO2 21*  --   --  22  GLUCOSE 117*  --   --  259*  BUN 21  --   --  20  CREATININE 1.44*  --   --  1.54*  CALCIUM 9.0  --   --  8.7*  MG 2.2  --   --  2.2   < > = values in this interval not displayed.   Liver Function Tests No results for input(s): "AST", "ALT", "ALKPHOS", "BILITOT", "PROT", "ALBUMIN" in the last  72 hours.  No results for input(s): "LIPASE", "AMYLASE" in the last 72 hours. Cardiac Enzymes No results for input(s): "CKTOTAL", "CKMB", "CKMBINDEX", "TROPONINI" in the last 72 hours.  BNP: BNP (last 3 results) Recent Labs    08/07/22 0719  BNP 872.8*    ProBNP (last 3 results) No results for input(s): "PROBNP" in the last 8760 hours.   D-Dimer No results for input(s): "DDIMER" in the last 72 hours. Hemoglobin A1C No results for input(s): "HGBA1C" in the last 72 hours. Fasting Lipid Panel Recent Labs    08/11/22 0001  CHOL 156  HDL 35*  LDLCALC 100*  TRIG 103  CHOLHDL 4.5   Thyroid Function Tests Recent Labs    08/11/22 0001  TSH 2.985    Other results:   Imaging    CARDIAC CATHETERIZATION  Result Date: 08/10/2022   Prox LAD to Mid LAD lesion is 20% stenosed.   The left ventricular ejection fraction is less than 25% by visual estimate. Findings: Ao = 85/61 (73) LV = 84/17 RA =  9 RV = 37/11 PA = 34/19 (27) PCW = 20 (v waves to 30) Fick cardiac output/index = 6.1/2.7 Thermo  CO/CI = 4.3/1.9 PVR = 1.6 (TD) SVR = 1194 FA sat = 96% PA sat = 53%, 55% LVEF 10% markedly dilated Assessment: 1. Minimal CAD 2. Severe NICM EF 10% 3. Mildly elevated filling pressures with severely reduced CO by Thermo Plan/Discussion: Will plan TEE/DC-CV and PVC suppression. Begin w/u for LVAD if EF not improved. Arvilla Meres, MD 3:13 PM    Medications:     Scheduled Medications:  Chlorhexidine Gluconate Cloth  6 each Topical Daily   vitamin B-12  1,000 mcg Oral Daily   dicyclomine  10 mg Oral BID WC   digoxin  0.125 mg Oral Daily   empagliflozin  10 mg Oral Daily   finasteride  5 mg Oral Daily   furosemide  80 mg Intravenous BID   melatonin  3 mg Oral QHS   pantoprazole  40 mg Oral Daily   polyethylene glycol  17 g Oral Daily   pravastatin  40 mg Oral Daily   sodium chloride flush  10-40 mL Intracatheter Q12H   sodium chloride flush  3 mL Intravenous Q12H   sodium chloride flush  3 mL Intravenous Q12H   tamsulosin  0.4 mg Oral QPC supper    Infusions:  sodium chloride     amiodarone 60 mg/hr (08/11/22 0413)   heparin 1,350 Units/hr (08/11/22 0440)   potassium chloride 10 mEq (08/11/22 0724)    PRN Medications: sodium chloride, acetaminophen, albuterol, ondansetron (ZOFRAN) IV, sodium chloride flush, sodium chloride flush    Patient Profile  Austin [name] is 67 year old with a history of chronic HFrEF, HTN, HLD, nonobstructive CAD 2012, CKD Stage III, PAF, and single chamber Biotronik .   Admitted with A/C HFrEF + A fib.   Assessment/Plan   1. A/C HFrEF, NICM  Initially diagnosed with HFrEF in 2012. EF previously  improved to 50-55% on medications. He had been lost to follow up in the HF clinic at [facility] and had been followed at [facility]. Most recent cath 2015 showed nonobstructive LCX OM 20%.  In 2022 EF went back down 25-30% and has continued to drop. EF now < 20%. He does have single chamber Biotronic.  Over the last 6 months he has had multiple heart failure  admissions.  NICM. LHC this admit w/ minimal CAD It is possible cardiomyopathy is  from high PVC burden.  RHC c/w severe NICM w/ mildly elevated filling pressures and severely reduced CO/CI by thermo 4.3/1.9. Co-ox 51% today. CVP 7   Plan TEE/DCCV tomorrow and try to suppress PVCs. If EF not improving w/ in 1 month w/ suppression, will need to consider LVAD - Continue Jardiance 10 mg  - Continue Digoxin 0.125 - Add spiro 12.5 mg daily  - Transition to PO Lasix  - Eventually may need CMRI but he had IV Iron this admit which could interfere with results.    2 A fib  Last in SR back in March of this year. Back in A fib in May and on admit.  -Continue to load amio.  -TEE/DC-CV tomorrow  -Continue eliquis 5 mg twice a day.   3. CKD Stage IIIa -Creatinine baseline ~1.  -Creatinine 1.5 today. Follow BMET.    4. Anemia, IDA -Iron sats 2%.  -Given IV iron.   5. RV Lead ? 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  However, need to rule out vegetation. WBCs normal and afebrile.  -He will have TEE with DCCV to assess.   -BCx NGTD    6. PVCs High PVC burden. ~ 50% ! - Improving w/ amiodarone, <10/min on tele  - continue amio - supp K   7. Hypokalemia - K 3.4 - supp K    Austin Simmons PA-C  7:41 AM   Patient seen and examined with the above-signed Advanced Practice Provider and/or Housestaff. I personally reviewed laboratory data, imaging studies and relevant notes. I independently examined the patient and formulated the important aspects of the plan. I have edited the note to reflect any of my changes or salient points. I have personally discussed the plan with the patient and/or family.  Feels ok. No CP or SOB. 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  Remains in AF on IV amio and heparin. PVCs suppressed   General:  Well appearing. No resp difficulty HEENT: normal Neck: supple. JVP 7 Carotids 2+ bilat; no  bruits. No lymphadenopathy or thryomegaly appreciated. Cor: PMI nondisplaced. Irregular rate & rhythm. No rubs, gallops or murmurs. Lungs: clear Abdomen: soft, nontender, nondistended. No hepatosplenomegaly. No bruits or masses. Good bowel sounds. Extremities: no cyanosis, clubbing, rash, edema Neuro: alert & orientedx3, cranial nerves grossly intact. moves all 4 extremities w/o difficulty. Affect pleasant  Results of cath reviewed with him  Continue to titrate GDMT. Will continue to load amio.   Plan TEE/DC-CV tomorrow. If EF not improving with PVC suppression over next 4-6 weeks will need to consider VAD. VAD team has seen.   Switch heparin to Eliquis.   Arvilla Meres, MD  9:50 AM

## 2022-08-11 NOTE — Assessment & Plan Note (Signed)
Cr 1.54 stable. CKD Stage 3. Per Care Everywhere, baseline 1-1.20.

## 2022-08-11 NOTE — Inpatient Diabetes Management (Signed)
Inpatient Diabetes Program Recommendations  AACE/ADA: New Consensus Statement on Inpatient Glycemic Control (2015)  Target Ranges:  Prepandial:   less than 140 mg/dL      Peak postprandial:   less than 180 mg/dL (1-2 hours)      Critically ill patients:  140 - 180 mg/dL   No results found for: "GLUCAP", "HGBA1C"  Review of Glycemic Control  Latest Reference Range & Units 08/11/22 00:01  Glucose 70 - 99 mg/dL 161 (H)   Diabetes history: None  Current orders for Inpatient glycemic control:  None  Inpatient Diabetes Program Recommendations:    -   Check CBGs ACHS -   Start Novolog 0-6 units tid (very sensitive scale starting at 150 mg/dl) + Novolog hs scale   Thanks,  Christena Deem RN, MSN, BC-ADM Inpatient Diabetes Coordinator Team Pager [phone] (8a-5p)

## 2022-08-11 NOTE — Progress Notes (Signed)
Patient complaining of sudden 6/10 abdominal pain. He denies chest pain, heart palpitation and shortness of breath. Stated feeling a bit nauseous, vitals signs stable. Gave PRN iv zofran. Paged Family medicine. Got orders for EKG, Tylenol and lactic acid lab work. Will continue to monitor patient.   Update 1430: patient states feeling better and abdominal pain subsiding.

## 2022-08-11 NOTE — Assessment & Plan Note (Addendum)
Elevated to 259. Likely stress reaction after heart catheterization. Will monitor.

## 2022-08-11 NOTE — Progress Notes (Signed)
Pre-VAD studies completed.   Please see CV Procedures for preliminary results.  Eimy Plaza, RVT  4:50 PM 08/11/22

## 2022-08-11 NOTE — Progress Notes (Signed)
Initial Nutrition Assessment  DOCUMENTATION CODES:   Non-severe (moderate) malnutrition in context of chronic illness  INTERVENTION:   - Ensure Enlive po BID, each supplement provides 350 kcal and 20 grams of protein  - Renal MVI daily given CKD stage IIIa  NUTRITION DIAGNOSIS:   Moderate Malnutrition related to chronic illness (HFrEF, CKD stage IIIa) as evidenced by mild fat depletion, moderate muscle depletion.  GOAL:   Patient will meet greater than or equal to 90% of their needs  MONITOR:   PO intake, Supplement acceptance, Labs, Weight trends, I & O's  REASON FOR ASSESSMENT:   Consult LVAD Eval  ASSESSMENT:   67 year old male who presented to the ED on 6/23 with chest pain and SOB. PMH of atrial fibrillation, HFrEF s/p ICD, nonischemic cardiomyopathy, CKD stage IIIa, HLD, BPH, HTN. Pt admitted with acute exacerbation of HF with reduced EF, atrial fibrillation.  06/26 - s/p RHC/LHC  Noted plan for TEE/DCCV tomorrow and try to suppress PVCs. Per notes, if EF not improving within 1 month with suppression, will need to consider LVAD.  Met with pt at bedside. Pt reports experiencing abdominal pain and nausea at time of visit. Noted lunch meal tray at bedside with only 1 bite of food consumed. MD came to see pt while RD was in room regarding sudden onset abdominal pain. Per MD, abdominal pain likely related to decreased blood flow to the intestines.  After a while, pt reported that pain was subsiding and was amenable to RD interview. Pt reports that when he is not having abdominal pain and nausea, he typically has a good appetite and clears his plate. Pt reports that he didn't eat much yesterday due to being NPO for a procedure.  Pt reports good appetite and PO intake at home. He states that he avoids fast-food restaurants and watches his beverage intake. Pt shares that he used to drink a lot of Diet [facility] but has cut that out entirely. Pt watches his sodium  intake.  Pt reports that his weight has fluctuated a lot recently due to volume. He states that he has been urinating quite frequently this admission and states that he has "lost 10 lbs of water weight." The last available weight PTA is from 03/28/12.  Based on NFPE, pt meets criteria for moderate malnutrition. Discussed oral nutrition supplements with pt. Pt is amenable to consume Ensure Enlive between meals. RD will also order daily MVI with minerals.  Admit weight: 100.7 kg Current weight: 93.2 kg  Meal Completion: 90-100%  Medications reviewed and include: vitamin B12 1000 mcg daily, bentyl 10 mg BID, jardiance, IV lasix 80 mg BID, melatonin, protonix, miralax, amiodarone gtt, heparin gtt, IV KCl 10 mEq x 6  Labs reviewed: sodium 134, potassium 3.4, creatinine 1.54, vitamin B12 151 (low), hemoglobin 9.1, hemoglobin A1C pending  UOP: 4925 ml x 24 hours I/O's: -9.1 L since admit  NUTRITION - FOCUSED PHYSICAL EXAM:  Flowsheet Row Most Recent Value  Orbital Region Mild depletion  Upper Arm Region Mild depletion  Thoracic and Lumbar Region Mild depletion  Buccal Region Moderate depletion  Temple Region Mild depletion  Clavicle Bone Region Mild depletion  Clavicle and Acromion Bone Region Moderate depletion  Scapular Bone Region Moderate depletion  Dorsal Hand Mild depletion  Patellar Region Mild depletion  Anterior Thigh Region Moderate depletion  Posterior Calf Region Mild depletion  Edema (RD Assessment) Mild  Hair Reviewed  Eyes Reviewed  Mouth Reviewed  Skin Reviewed  Nails Reviewed  Diet Order:   Diet Order             Diet NPO time specified  Diet effective midnight           Diet Heart Room service appropriate? Yes; Fluid consistency: Thin  Diet effective now                   EDUCATION NEEDS:   Education needs have been addressed  Skin:  Skin Assessment: Reviewed RN Assessment  Last BM:  08/07/22  Height:   Ht Readings from Last 1 Encounters:   08/11/22 6\' 4"  (1.93 m)    Weight:   Wt Readings from Last 1 Encounters:  08/11/22 93.2 kg    BMI:  Body mass index is 25.01 kg/m.  Estimated Nutritional Needs:   Kcal:  2300-2500  Protein:  115-130 grams  Fluid:  2.0 L/day    Mertie Clause, MS, RD, LDN Inpatient Clinical Dietitian Please see AMiON for contact information.

## 2022-08-11 NOTE — Progress Notes (Addendum)
FMTS Interim Progress Note  S: Messaged by RN that patient has sudden 6 out of 10 abdominal pain.  MD to bedside.  Reports this is the same abdominal pain that has been present intermittently throughout admission.  Described as dull diffusely across abdomen.  Occasionally radiates through chest on left side.  Not associated with eating.  Is now getting better.  Last bowel movement nonbloody this morning.  O: BP 113/78 (BP Location: Left Arm)   Pulse 67   Temp 98.6 F (37 C) (Oral)   Resp (!) 27   Ht 6\' 4"  (1.93 m)   Wt 93.2 kg   SpO2 95%   BMI 25.01 kg/m   General: NAD, sitting up in hospital bed Neuro: A&O Cardiovascular: Irregularly irregular, no murmurs appreciated Respiratory: Mildly increased work of breathing (anxiety), rare right-sided wheeze, cough present with congestion Abdomen: soft, mildly tender to palpation diffusely, no rebound or guarding Extremities: Moving all 4 extremities equally, feet mildly cool, hands warm, radial pulse 2+   A/P: Abdominal pain Given patient's extensive cardiac history and catheterization yesterday with low output heart failure.  This is most likely intermittent mesenteric ischemia due to decreased cardiac output.  Do not suspect ACS given nonischemic cardiomyopathy seen on catheterization yesterday. patient does not appear volume overloaded on exam making excess fluid less likely, and diuretic response has been good thus far through admission.  Constipation less likely given bowel movement.  Low suspicion for new pulmonary process, rule rule out with chest x-ray.  Vital signs remained reassuringly stable, and perfusion is unchanged from my previous exam this a.m.  Will rule out ACS with EKG.  Troponin inaccurate in the setting of recent left heart cath.  Repeat chest x-ray.  Suspect lactic acid will be elevated given low output state however will collect for comparison prior.  Tylenol and Bentyl for pain.  Unfortunately, long-term treatment for his  pain likely will depend on results of cardioversion tomorrow.  Celine Mans, MD 08/11/2022, 1:16 PM PGY-1, [facility] Family Medicine Service pager [phone]  ADDENDUM: EKG reviewed, concerning for ST elevation. Discussed with Dr. [name]. Represents LBBB. Not pathologic. Continue Heat Failure team management, recs appreciated.  Reviewed CXR, mild left lower lung patchiness, may be consistent with mild pulmonary edema. Continue diuresis.

## 2022-08-11 NOTE — Progress Notes (Addendum)
Brief MCS Note:  VAD Coordinator briefly met with patient to introduce one of our current VAD patient's and his wife to him today to answer questions about living with and caring for someone on MCS. Will follow up tomorrow to provide extended education on VAD.  Simmie Davies RN, BSN VAD Coordinator 24/7 Pager [phone]

## 2022-08-11 NOTE — Assessment & Plan Note (Addendum)
Labs consistent with iron deficiency anemia. Hgb stable 9.1. -AM CBC -Replete Iron, B12 -Consider GI consult, pending Cardioversion -S/p IV iron x2 -Transfuse Hgb <8 -Heparin

## 2022-08-11 NOTE — Progress Notes (Signed)
Mobility Specialist Progress Note:   08/11/22 1134  Mobility  Activity Ambulated with assistance in hallway  Level of Assistance Contact guard assist, steadying assist  Assistive Device None  Distance Ambulated (ft) 400 ft  Activity Response Tolerated well  Mobility Referral Yes  $Mobility charge 1 Mobility  Mobility Specialist Start Time (ACUTE ONLY) 1015  Mobility Specialist Stop Time (ACUTE ONLY) 1026  Mobility Specialist Time Calculation (min) (ACUTE ONLY) 11 min   Received pt in chair having no complaints and agreeable to mobility. No physical assistance needed throughout session. Pt was asymptomatic throughout ambulation and returned to room w/o fault. Left in chair w/ call bell in reach and all needs met.   Pre Mobility HR 69  During Mobility  HR 88 Post Mobility  Hr 80  Thompson Grayer Mobility Specialist  Please contact vis Secure Chat or  Rehab Office [phone]

## 2022-08-11 NOTE — Progress Notes (Signed)
Daily Progress Note Intern Pager: [phone]  Patient name: Austin [name] Medical record number: [id number] Date of birth: [birth year] Age: 67 y.o. Gender: male  Primary Care Provider: [name], NP Consultants: Cardiology, Heart Failure Code Status: Full  Pt Overview and Major Events to Date:  -Admitted 08/08/22 -LHC/RHC 08/10/22  Assessment and Plan: Austin [name] is a 67 y.o. male admitted for acute on chronic HFrEF exacerbation. Pertinent PMH/PSH includes HTN, CHF s/p ICD, HLD, CKD3.    LHC/RHC demonstrating low output cardiac state. Will see if cardioversion benefits patient.  Hospital Problem List      Hospital     * (Principal) Acute exacerbation of CHF (congestive heart failure)  (HCC)     Cardiac catheterization demonstrating EF 10%. Cardiac output poor.  Hopefully cardioversion will improve EF and output, if not planning for  LVAD. -Heart failure consulted, recs appreciated  -Cardioversion planned for Friday  -Continue Digoxin, Jardiance  -Lasix 80mg  mg twice a day        Anemia     Labs consistent with iron deficiency anemia. Patient is taking Eliquis  as below. Hgb stable 9.1. -AM CBC -Replete Iron, B12 -Consider GI consult, pending Cardioversion -S/p IV iron x2 -Transfuse Hgb <8        Atrial fibrillation (HCC)     Heart rate controlled 80-90s on exam today.  Many PVCs. Currently in  A-fib. -Stop Coreg -Continue Eliquis -Cardioversion this week -Amio gtt        Elevated serum creatinine     Cr 1.54 stable. CKD Stage 3. Per Care Everywhere, baseline 1-1.20.        HFrEF (heart failure with reduced ejection fraction) (HCC)    FEN/GI: Heart healthy, NPO at midnight PPx: Heparin Dispo:Pending clinical improvement  Subjective:  No acute events overnight.  Feels that heart catheterization yesterday went well.  Denies any difficulty breathing or chest pain today.  Mild abdominal pain.   Objective: Temp:  [97.9 F (36.6 C)-98.9  F (37.2 C)] 98.6 F (37 C) (06/27 0720) Pulse Rate:  [53-84] 72 (06/27 0720) Resp:  [13-23] 18 (06/27 0720) BP: (89-135)/(41-121) 107/77 (06/27 0720) SpO2:  [82 %-100 %] 100 % (06/27 0720) Weight:  [93.2 kg] 93.2 kg (06/27 0720) Physical Exam: General: NAD  Neuro: A&O Cardiovascular: irregular rate and rhythm, no murmurs, no peripheral edema Respiratory: normal WOB on RA, CTAB, no wheezes, ronchi or rales Abdomen: soft, mild TTP, no rebound or guarding Extremities: Moving all 4 extremities equally   Laboratory: Most recent CBC Lab Results  Component Value Date   WBC 10.2 08/11/2022   HGB 9.1 (L) 08/11/2022   HCT 31.2 (L) 08/11/2022   MCV 78.2 (L) 08/11/2022   PLT 229 08/11/2022   Most recent BMP    Latest Ref Rng & Units 08/11/2022   12:01 AM  BMP  Glucose 70 - 99 mg/dL 147   BUN 8 - 23 mg/dL 20   Creatinine 8.29 - 1.24 mg/dL 5.62   Sodium 130 - 865 mmol/L 134   Potassium 3.5 - 5.1 mmol/L 3.4   Chloride 98 - 111 mmol/L 98   CO2 22 - 32 mmol/L 22   Calcium 8.9 - 10.3 mg/dL 8.7     Undergoing lab evaluation in LVAD is required, thus far negative. (Factor 5 leiden, Lupus, HIV, Hep B/C, Antithrombin III, PSA, Uric acid, TSH, T4, Dental scan).  Imaging/Diagnostic Tests: No new imaging.  Austin Mans, MD 08/11/2022, 8:15 AM  PGY-1, Tressie Ellis  [place] Intern pager: [phone], text pages welcome Secure chat group [place]

## 2022-08-11 NOTE — Progress Notes (Signed)
ANTICOAGULATION CONSULT NOTE   Pharmacy Consult for Heparin while Eliquis on hold Indication: atrial fibrillation  No Known Allergies  Patient Measurements: Height: 6\' 4"  (193 cm) Weight: 95.8 kg (211 lb 3.2 oz) IBW/kg (Calculated) : 86.8 Heparin Dosing Weight: total   Vital Signs: Temp: 98.9 F (37.2 C) (06/26 2327) Temp Source: Oral (06/26 2327) BP: 107/Austin (06/26 2327) Pulse Rate: 53 (06/26 2327)  Labs: Recent Labs    08/09/22 0006 08/10/22 0445 08/10/22 1421 08/10/22 1446 08/10/22 1447 08/11/22 0001 08/11/22 0351  HGB 8.9* 8.7*   < > 10.2* 10.2* 9.1*  --   HCT 30.0* 30.2*   < > 30.0* 30.0* 31.2*  --   PLT 203 196  --   --   --  229  --   APTT  --   --   --   --   --  Austin* Austin*  HEPARINUNFRC  --   --   --   --   --   --  >1.10*  CREATININE 1.37* 1.44*  --   --   --  1.54*  --    < > = values in this interval not displayed.     Estimated Creatinine Clearance: 56.4 mL/min (A) (by C-G formula based on SCr of 1.54 mg/dL (H)).   Medical History: Past Medical History:  Diagnosis Date   Benign prostatic hypertrophy    Hyperlipidemia    Nonischemic cardiomyopathy [facility]) Oct 2012   cath 12/10/10 minimal nonobstructive coronary artery disease   Systolic heart failure Oct 2012   echo 12/08/10 EF 20%, severe left ventricular enlargement, mild right ventricular enlargement    Medications:  Scheduled:   Chlorhexidine Gluconate Cloth  6 each Topical Daily   vitamin B-12  1,000 mcg Oral Daily   dicyclomine  10 mg Oral BID WC   digoxin  0.125 mg Oral Daily   empagliflozin  10 mg Oral Daily   finasteride  5 mg Oral Daily   furosemide  80 mg Intravenous BID   melatonin  3 mg Oral QHS   pantoprazole  40 mg Oral Daily   polyethylene glycol  17 g Oral Daily   pravastatin  40 mg Oral Daily   sodium chloride flush  10-40 mL Intracatheter Q12H   sodium chloride flush  3 mL Intravenous Q12H   sodium chloride flush  3 mL Intravenous Q12H   tamsulosin  0.4 mg Oral QPC supper    Infusions:   sodium chloride     amiodarone 60 mg/hr (08/11/22 0413)   heparin 1,200 Units/hr (08/10/22 2006)   potassium chloride 10 mEq (08/11/22 0434)   PRN: sodium chloride, acetaminophen, albuterol, ondansetron (ZOFRAN) IV, sodium chloride flush, sodium chloride flush  Assessment: 67 yo Austin [name] on chronic eliquis for hx afib admitted with A/C HFrEF and afib. Pharmacy consulted to dose IV heparin, to begin 2hr after TR band removal. Will monitor using aPTT since heparin levels will be falsely elevated with recent eliquis use.  6/27 AM update:  aPTT sub-therapeutic at Austin after re-start s/p cath  Goal of Therapy:  aPTT 66-102 sec Heparin level 0.3-0.7 units/ml Monitor platelets by anticoagulation protocol: Yes   Plan:  Inc heparin to 1350 units/hr 1300 aPTT and heparin level  Abran Duke, PharmD, BCPS Clinical Pharmacist Phone: [phone]

## 2022-08-11 NOTE — Progress Notes (Signed)
Palliative:  I came to bedside to visit with Austin [name]. He is speaking with CSW Annice Pih for VAD evaluation and then he was having ultrasound. I introduced myself and left Abdulaziz a copy of Advance Directives to review if he wishes before we speak tomorrow for VAD evaluation.   No charge  Yong Channel, NP Palliative Medicine Team Pager [phone] (Please see [email or web address] for schedule) Team Phone [phone]

## 2022-08-12 ENCOUNTER — Other Ambulatory Visit (HOSPITAL_COMMUNITY): Payer: Self-pay

## 2022-08-12 ENCOUNTER — Encounter: Payer: Self-pay | Admitting: Surgery

## 2022-08-12 DIAGNOSIS — I5023 Acute on chronic systolic (congestive) heart failure: Secondary | ICD-10-CM

## 2022-08-12 DIAGNOSIS — I5020 Unspecified systolic (congestive) heart failure: Secondary | ICD-10-CM | POA: Diagnosis not present

## 2022-08-12 DIAGNOSIS — I428 Other cardiomyopathies: Secondary | ICD-10-CM

## 2022-08-12 DIAGNOSIS — N183 Chronic kidney disease, stage 3 unspecified: Secondary | ICD-10-CM

## 2022-08-12 DIAGNOSIS — I5022 Chronic systolic (congestive) heart failure: Secondary | ICD-10-CM

## 2022-08-12 DIAGNOSIS — I4891 Unspecified atrial fibrillation: Secondary | ICD-10-CM

## 2022-08-12 DIAGNOSIS — Z515 Encounter for palliative care: Secondary | ICD-10-CM

## 2022-08-12 DIAGNOSIS — Z7189 Other specified counseling: Secondary | ICD-10-CM

## 2022-08-12 LAB — URINALYSIS, ROUTINE W REFLEX MICROSCOPIC
Bacteria, UA: NONE SEEN
Bilirubin Urine: NEGATIVE
Glucose, UA: >=500 mg/dL — AB
Hgb urine dipstick: NEGATIVE
Ketones, ur: NEGATIVE mg/dL
Leukocytes,Ua: NEGATIVE
Nitrite: NEGATIVE
Protein, ur: NEGATIVE mg/dL
Specific Gravity, Urine: 1.018 (ref 1.005–1.030)
pH: 5 (ref 5.0–8.0)

## 2022-08-12 LAB — BASIC METABOLIC PANEL
Anion gap: 13 (ref 5–15)
BUN: 18 mg/dL (ref 8–23)
CO2: 22 mmol/L (ref 22–32)
Calcium: 8.8 mg/dL — ABNORMAL LOW (ref 8.9–10.3)
Chloride: 100 mmol/L (ref 98–111)
Creatinine, Ser: 1.62 mg/dL — ABNORMAL HIGH (ref 0.61–1.24)
GFR, Estimated: 46 mL/min — ABNORMAL LOW (ref >60)
Glucose, Bld: 182 mg/dL — ABNORMAL HIGH (ref 70–99)
Potassium: 3.8 mmol/L (ref 3.5–5.1)
Sodium: 135 mmol/L (ref 135–145)

## 2022-08-12 LAB — CULTURE, BLOOD (ROUTINE X 2)
Special Requests: ADEQUATE
Special Requests: ADEQUATE

## 2022-08-12 LAB — HEMOGLOBIN A1C
Hgb A1c MFr Bld: 6.2 % — ABNORMAL HIGH (ref 4.8–5.6)
Mean Plasma Glucose: 131 mg/dL

## 2022-08-12 LAB — CBC
HCT: 31.7 % — ABNORMAL LOW (ref 39.0–52.0)
Hemoglobin: 9.4 g/dL — ABNORMAL LOW (ref 13.0–17.0)
MCH: 23.4 pg — ABNORMAL LOW (ref 26.0–34.0)
MCHC: 29.7 g/dL — ABNORMAL LOW (ref 30.0–36.0)
MCV: 78.9 fL — ABNORMAL LOW (ref 80.0–100.0)
Platelets: 204 10*3/uL (ref 150–400)
RBC: 4.02 MIL/uL — ABNORMAL LOW (ref 4.22–5.81)
RDW: 19.2 % — ABNORMAL HIGH (ref 11.5–15.5)
WBC: 12.7 10*3/uL — ABNORMAL HIGH (ref 4.0–10.5)
nRBC: 0 % (ref 0.0–0.2)

## 2022-08-12 LAB — COOXEMETRY PANEL
Carboxyhemoglobin: 2 % — ABNORMAL HIGH (ref 0.5–1.5)
Methemoglobin: <0.7 % (ref 0.0–1.5)
O2 Saturation: 53.5 %
Total hemoglobin: 9.6 g/dL — ABNORMAL LOW (ref 12.0–16.0)

## 2022-08-12 LAB — MAGNESIUM: Magnesium: 2.3 mg/dL (ref 1.7–2.4)

## 2022-08-12 SURGERY — CARDIOVERSION
Anesthesia: General

## 2022-08-12 MED ORDER — DIGOXIN 125 MCG PO TABS
0.1250 mg | ORAL_TABLET | Freq: Every day | ORAL | 0 refills | Status: DC
  Filled 2022-08-12: qty 30, 30d supply, fill #0

## 2022-08-12 MED ORDER — AMIODARONE HCL 200 MG PO TABS: 200.0000 mg | ORAL_TABLET | Freq: Every day | ORAL | Status: DC

## 2022-08-12 MED ORDER — TORSEMIDE 20 MG PO TABS
40.0000 mg | ORAL_TABLET | Freq: Every day | ORAL | 0 refills | Status: DC
  Filled 2022-08-12: qty 60, 30d supply, fill #0

## 2022-08-12 MED ORDER — AMIODARONE HCL 200 MG PO TABS
200.0000 mg | ORAL_TABLET | Freq: Two times a day (BID) | ORAL | 0 refills | Status: DC
  Filled 2022-08-12: qty 60, 30d supply, fill #0

## 2022-08-12 MED ORDER — AMIODARONE HCL 200 MG PO TABS
400.0000 mg | ORAL_TABLET | Freq: Two times a day (BID) | ORAL | Status: DC
  Filled 2022-08-12: qty 2

## 2022-08-12 MED ORDER — TORSEMIDE 20 MG PO TABS
40.0000 mg | ORAL_TABLET | Freq: Every day | ORAL | Status: DC
  Administered 2022-08-12: 40 mg via ORAL
  Filled 2022-08-12: qty 2

## 2022-08-12 MED ORDER — SPIRONOLACTONE 12.5 MG HALF TABLET
12.5000 mg | ORAL_TABLET | Freq: Every day | ORAL | Status: DC
  Administered 2022-08-12: 12.5 mg via ORAL
  Filled 2022-08-12: qty 1

## 2022-08-12 MED ORDER — ACETAMINOPHEN 325 MG PO TABS: 650.0000 mg | ORAL_TABLET | ORAL | Status: DC | PRN

## 2022-08-12 MED ORDER — FUROSEMIDE 40 MG PO TABS: 40.0000 mg | ORAL_TABLET | Freq: Every day | ORAL | Status: DC

## 2022-08-12 MED ORDER — LOSARTAN POTASSIUM 25 MG PO TABS
12.5000 mg | ORAL_TABLET | Freq: Every day | ORAL | 0 refills | Status: DC
  Filled 2022-08-12: qty 15, 30d supply, fill #0

## 2022-08-12 MED ORDER — POTASSIUM CHLORIDE CRYS ER 20 MEQ PO TBCR
40.0000 meq | EXTENDED_RELEASE_TABLET | Freq: Once | ORAL | Status: AC
  Administered 2022-08-12: 40 meq via ORAL
  Filled 2022-08-12: qty 2

## 2022-08-12 MED ORDER — AMIODARONE HCL 200 MG PO TABS
200.0000 mg | ORAL_TABLET | Freq: Two times a day (BID) | ORAL | Status: DC
  Administered 2022-08-12: 200 mg via ORAL
  Filled 2022-08-12: qty 1

## 2022-08-12 MED ORDER — LOSARTAN POTASSIUM 25 MG PO TABS
12.5000 mg | ORAL_TABLET | Freq: Every day | ORAL | Status: DC
  Administered 2022-08-12: 12.5 mg via ORAL
  Filled 2022-08-12: qty 1

## 2022-08-12 MED ORDER — SPIRONOLACTONE 25 MG PO TABS
12.5000 mg | ORAL_TABLET | Freq: Every day | ORAL | 0 refills | Status: DC
  Filled 2022-08-12: qty 15, 30d supply, fill #0

## 2022-08-12 NOTE — Discharge Summary (Signed)
Family Medicine Teaching [facility] Discharge Summary  Patient name: Austin [name] Medical record number: [id number] Date of birth: [birth year] Age: 67 y.o. Gender: male Date of Admission: 08/07/2022  Date of Discharge: 08/12/22 Admitting Physician: Celine Mans, MD  Primary Care Provider: [name], NP Consultants: Heart failure, cardiology  Indication for Hospitalization: Heart failure exacerbation with low output  Discharge Diagnoses/Problem List:  Principal Problem for Admission: Heart failure exacerbation with low output Other Problems addressed during stay:  Principal Problem:   Acute exacerbation of CHF (congestive heart failure) (HCC) Active Problems:   Anemia   Atrial fibrillation (HCC)   Elevated serum creatinine   Hyperglycemia   HFrEF (heart failure with reduced ejection fraction) (HCC)   Malnutrition of moderate degree    Brief Hospital Course:  Austin [name] is a 67 y.o. male who was admitted to the [facility] Medicine Teaching Service at [facility] for CHF exacerbation. Hospital course is outlined below by problem.   CHF exacerbation Presented with shortness of breath, tachycardia, tachypnea.  History of EF 15-20% in 03/2022.  Placed on 3 L O2 at that time.  BNP 872, troponin 48, 45.  CXR with pleural effusions.  Admitted for volume overload.  Given multiple doses of Lasix with good response.  Continued on home meds.  Repeat echo with stable EF of <20%, and concern for ICD lead vegetation. Cardiology consulted who was less concerned for vegetation. They recommended COOX for further monitoring of poor perfusion.  Ultimately underwent heart cath with minimal CAD, severe NICM EF 10%, mildly elevated filling pressures with severely reduced cardiac output.   Initiated LVAD workup while inpatient.  Heart failure team plan to reassess in 4 to 6 weeks for need for LVAD.  At discharge patient was on below cardiac medications: -Amiodarone 200 mg twice  daily -Eliquis 5 mg twice daily -Jardiance 10 mg daily -Digoxin 0.125 mg daily -Spironolactone 12.5 mg daily -Torsemide 40 mg daily -Losartan 12.5 g daily  Medications that were stopped: Carvedilol, Bumex, Entresto  Atrial fibrillation Presented with heart rates 100s to 110s.  Home Coreg and Eliquis continued.  Coreg stopped due to lower BP.  Placed on amiodarone drip.  By discharge, patient converted to normal sinus rhythm.  Discharged on p.o. amiodarone.  Microcytic anemia Hemoglobin 9.1 with MCV 78.4.  No evidence of bleeding on admission. Unknown baseline.  Anemia panel demonstrated IDA.  He received 2 IV iron infusions over admission.  By discharge, hemoglobin was stable at 9.4.  Chronic stable conditions CKD 3-creatinine 1.4, stable slight increase with diuresis BPH-continued Flomax, finasteride Hypertension-medications adjusted as needed to account for heart failure and lower blood pressures. HLD-continued pravastatin  Issues for follow up: Repeat RUQ ultrasound for gallbladder polyps in 6 months time. GI Evaluation outpatient for anemia. Repeat BMP, CBC Ensure heart failure team follow-up.  Disposition: stable  Discharge Condition: home   Discharge Exam:  Vitals:   08/12/22 0918 08/12/22 0929  BP:  102/68  Pulse: 67 61  Resp:  17  Temp:  98 F (36.7 C)  SpO2:  91%   General: NAD  Neuro: A&O Cardiovascular: RRR, no murmurs, no peripheral edema Respiratory: normal WOB on RA, CTAB, no wheezes, ronchi or rales Abdomen: soft, NTTP, no rebound or guarding Extremities: Cool lower extremities  Significant Procedures:   Right/Left Heart Catherterization Findings:  Ao = 85/61 (73) LV = 84/17 RA =  9 RV = 37/11 PA = 34/19 (27) PCW = 20 (v waves to 30) Fick cardiac  output/index = 6.1/2.7 Thermo CO/CI = 4.3/1.9 PVR = 1.6 (TD) SVR = 1194 FA sat = 96% PA sat = 53%, 55% LVEF 10% markedly dilated   Assessment: 1. Minimal CAD 2. Severe NICM EF 10% 3.  Mildly elevated filling pressures with severely reduced CO by Thermo    Significant Labs and Imaging:  Recent Labs  Lab 08/10/22 1447 08/11/22 0001 08/12/22 0415  WBC  --  10.2 12.7*  HGB 10.2* 9.1* 9.4*  HCT 30.0* 31.2* 31.7*  PLT  --  229 204   Recent Labs  Lab 08/10/22 1421 08/10/22 1446 08/10/22 1447 08/11/22 0001 08/12/22 0415  NA 142 139 140 134* 135  K 3.5 3.6 3.7 3.4* 3.8  CL  --   --   --  98 100  CO2  --   --   --  22 22  GLUCOSE  --   --   --  259* 182*  BUN  --   --   --  20 18  CREATININE  --   --   --  1.54* 1.62*  CALCIUM  --   --   --  8.7* 8.8*  MG  --   --   --  2.2 2.3   CO-OX 54 08/12/22 LA 2.9->1.5  CT Chest with Contrast 1. No evidence of pulmonary embolus. 2. Cardiomegaly, with prominent left ventricular dilatation. 3. Small bilateral pleural effusions, right greater than left. 4. Pericholecystic fat stranding partially visualized, unchanged since earlier CT. Please see previous CT and ultrasound abdomen reports. 5. Aortic Atherosclerosis (ICD10-I70.0). Coronary artery atherosclerosis  CT Abdomen with Contrast 1. Bilateral pleural effusions, right greater than left. Ground-glass attenuation and interlobular septal thickening identified within the lung bases. Correlate for any clinical signs or symptoms of CHF. 2. Small stone or polyp within the gallbladder measuring 4 mm. Mild low-density thickening of the gallbladder wall appears asymmetric measuring up to 5 mm. No surrounding fat stranding. If there is a clinical concern for acute cholecystitis consider further evaluation with right upper quadrant ultrasound. 3. Three focal enhancing lesions within the spleen are noted which measure up to 5 mm. These are nonspecific and may represent small hemangiomas or benign vascular lesions. 4. Prostate gland enlargement with mass effect upon the base of bladder. 5.  Aortic Atherosclerosis (ICD10-I70.0).  Results/Tests Pending at Time of  Discharge: None  Discharge Medications:  Allergies as of 08/12/2022   No Known Allergies      Medication List     STOP taking these medications    aspirin 81 MG tablet   benzonatate 100 MG capsule Commonly known as: TESSALON   bumetanide 2 MG tablet Commonly known as: BUMEX   carvedilol 12.5 MG tablet Commonly known as: COREG   ciprofloxacin 500 MG tablet Commonly known as: CIPRO   Entresto 24-26 MG Generic drug: sacubitril-valsartan   furosemide 20 MG tablet Commonly known as: LASIX   isosorbide mononitrate 30 MG 24 hr tablet Commonly known as: IMDUR       TAKE these medications    acetaminophen 325 MG tablet Commonly known as: TYLENOL Take 2 tablets (650 mg total) by mouth every 4 (four) hours as needed for headache or mild pain.   albuterol 108 (90 Base) MCG/ACT inhaler Commonly known as: VENTOLIN HFA Inhale 1-2 puffs into the lungs every 6 (six) hours as needed.   amiodarone 200 MG tablet Commonly known as: PACERONE Take 1 tablet (200 mg total) by mouth 2 (two) times daily.   apixaban  5 MG Tabs tablet Commonly known as: ELIQUIS Take 5 mg by mouth 2 (two) times daily.   Cholecalciferol 50 MCG (2000 UT) Tabs Take 1 tablet by mouth daily.   digoxin 0.125 MG tablet Commonly known as: LANOXIN Take 1 tablet (0.125 mg total) by mouth daily. Start taking on: August 13, 2022 What changed:  medication strength how much to take   fenofibrate micronized 67 MG capsule Commonly known as: LOFIBRA Take 67 mg by mouth every morning.   ferrous sulfate 325 (65 FE) MG tablet Take 325 mg by mouth.   finasteride 5 MG tablet Commonly known as: PROSCAR Take 1 tablet by mouth daily.   hydrocortisone 2.5 % cream Apply 1 Application topically.   Jardiance 10 MG Tabs tablet Generic drug: empagliflozin Take 10 mg by mouth daily.   ketoconazole 2 % shampoo Commonly known as: NIZORAL Apply 1 Application topically 3 (three) times a week.   loratadine 10 MG  tablet Commonly known as: CLARITIN Take 10 mg by mouth daily.   losartan 25 MG tablet Commonly known as: COZAAR Take 0.5 tablets (12.5 mg total) by mouth daily.   melatonin 3 MG Tabs tablet Take 3 mg by mouth at bedtime.   omeprazole 20 MG capsule Commonly known as: PRILOSEC Take 20 mg by mouth daily.   pravastatin 40 MG tablet Commonly known as: PRAVACHOL Take 1 tablet (40 mg total) by mouth daily.   spironolactone 25 MG tablet Commonly known as: ALDACTONE Take 0.5 tablets (12.5 mg total) by mouth daily. Start taking on: August 13, 2022 What changed: how much to take   tamsulosin 0.4 MG Caps capsule Commonly known as: FLOMAX Take 0.4 mg by mouth daily after supper.   Torsemide 40 MG Tabs Take 40 mg by mouth daily.        Discharge Instructions: Please refer to Patient Instructions section of EMR for full details.  Patient was counseled important signs and symptoms that should prompt return to medical care, changes in medications, dietary instructions, activity restrictions, and follow up appointments.   Follow-Up Appointments:  Follow-up Information     [facility] Follow up on 08/22/2022.   Specialty: Cardiology Why: [facility] 12 pm Entrance C, Free Valet Parking Contact information: [address] [id number] mc [address] [place] [address] [phone]                Celine Mans, MD 08/12/2022, 12:22 PM PGY-1, [facility]

## 2022-08-12 NOTE — Progress Notes (Signed)
FMTS Brief Progress Note  S: Doing well, NPO @ MN. No chest pain at present.   O: BP 108/76 (BP Location: Left Arm)   Pulse 73   Temp 98.1 F (36.7 C) (Oral)   Resp 20   Ht 6\' 4"  (1.93 m)   Wt 93.2 kg   SpO2 91%   BMI 25.01 kg/m   General: NAD, pleasant, able to participate in exam Card: RRR, no m/g/r Respiratory: No respiratory distress Skin: warm and dry, no rashes noted Psych: Normal affect and mood   A/P: - Sinus rhythm? DCCV in AM per cardiology.  -Plan per day team  - Orders reviewed. Labs for AM ordered, which was adjusted as needed.   Alfredo Martinez, MD 08/12/2022, 12:13 AM PGY-2, [place] [facility] Night Resident  Please page [phone] with questions.

## 2022-08-12 NOTE — Assessment & Plan Note (Signed)
Cardiac catheterization demonstrating EF 10%. Cardiac output poor. Converted to sinus rhythm with amiodarone yesterday.  -Heart failure consulted, recs appreciated  -Cardioversion planned for Friday  -Continue Digoxin, Jardiance  -Restart Cleda Daub  -Lasix 40mg  mg daily

## 2022-08-12 NOTE — Progress Notes (Signed)
Daily Progress Note Intern Pager: [phone]  Patient name: SPENCE GUSMANO Medical record number: [id number] Date of birth: 10-02-1955 Age: 67 y.o. Gender: male  Primary Care Provider: [name], NP Consultants: Cardiology, Heart Failure Code Status: Full  Pt Overview and Major Events to Date:  -Admitted 08/08/22 -LHC/RHC 08/10/22  Assessment and Plan: IGNAZIO MOSCONE is a 67 y.o. male admitted for acute on chronic HFrEF exacerbation. Pertinent PMH/PSH includes HTN, CHF s/p ICD, HLD, CKD3.    LHC/RHC demonstrating low output cardiac state. Spontaneously converted yesterday.   Hospital Problem List      Hospital     * (Principal) Acute exacerbation of CHF (congestive heart failure)  (HCC)     Cardiac catheterization demonstrating EF 10%. Cardiac output poor.  Converted to sinus rhythm with amiodarone yesterday.  -Heart failure consulted, recs appreciated  -Cardioversion planned for Friday  -Continue Digoxin, Jardiance  -Restart Cleda Daub  -Lasix 40mg  mg daily        Anemia     Labs consistent with iron deficiency anemia. Hgb stable 9.4. -AM CBC -Replete Iron, B12 -GI outpatient -S/p IV iron x2 -Transfuse Hgb <8        Atrial fibrillation (HCC)     Heart rate controlled 80-90s on exam today.  PVCs decreased with amio.  Currently in sinus rhythm. -Stop Coreg -Continue Eliquis -Cardioversion this week -Amio gtt        Elevated serum creatinine     Cr increased to 1.62. CKD Stage 3. Per Care Everywhere, baseline  1-1.20. -Decrease diuresis per heart failure team        HFrEF (heart failure with reduced ejection fraction) (HCC)     Hyperglycemia     A1c 6.2.        Malnutrition of moderate degree    FEN/GI: Heart healthy diet PPx: Eliquis Dispo: Pending clinical improvement  Subjective:  No acute events overnight.  Patient did report that he woke up and felt like his heart is beating really fast.  1 minute long episode of V. tach that  resolved on telemetry.  Objective: Temp:  [97.9 F (36.6 C)-98.6 F (37 C)] 98 F (36.7 C) (06/28 0929) Pulse Rate:  [61-77] 61 (06/28 0929) Resp:  [17-27] 17 (06/28 0929) BP: (100-133)/(68-85) 102/68 (06/28 0929) SpO2:  [91 %-96 %] 91 % (06/28 0929) Weight:  [93.1 kg] 93.1 kg (06/28 0650) Physical Exam: General: NAD  Neuro: A&O Cardiovascular: RRR, no murmurs, no peripheral edema Respiratory: normal WOB on RA, CTAB, no wheezes, ronchi or rales Abdomen: soft, NTTP, no rebound or guarding Extremities: Cool lower extremities   Laboratory: Most recent CBC Lab Results  Component Value Date   WBC 12.7 (H) 08/12/2022   HGB 9.4 (L) 08/12/2022   HCT 31.7 (L) 08/12/2022   MCV 78.9 (L) 08/12/2022   PLT 204 08/12/2022   Most recent BMP    Latest Ref Rng & Units 08/12/2022    4:15 AM  BMP  Glucose 70 - 99 mg/dL 147   BUN 8 - 23 mg/dL 18   Creatinine 8.29 - 1.24 mg/dL 5.62   Sodium 130 - 865 mmol/L 135   Potassium 3.5 - 5.1 mmol/L 3.8   Chloride 98 - 111 mmol/L 100   CO2 22 - 32 mmol/L 22   Calcium 8.9 - 10.3 mg/dL 8.8     Mag 2.3  CO-OX 53.5  UA - glucosuria (Jardiance)  Imaging/Diagnostic Tests: No new imaging.  Celine Mans, MD 08/12/2022, 9:44 AM  PGY-1, [place] Intern pager: [phone], text pages welcome Secure chat group [place]

## 2022-08-12 NOTE — Progress Notes (Signed)
Mobility Specialist Progress Note:    08/12/22 1114  Mobility  Activity Ambulated with assistance in hallway  Level of Assistance Contact guard assist, steadying assist  Assistive Device None  Distance Ambulated (ft) 300 ft  Activity Response Tolerated well  Mobility Referral Yes  $Mobility charge 1 Mobility  Mobility Specialist Start Time (ACUTE ONLY) 1047  Mobility Specialist Stop Time (ACUTE ONLY) 1102  Mobility Specialist Time Calculation (min) (ACUTE ONLY) 15 min   Received pt in chair having no complaints and agreeable to mobility. Pt was asymptomatic throughout ambulation and returned to room w/o fault. Left in chair w/ call bell in reach and all needs met.    Thompson Grayer Mobility Specialist  Please contact vis Secure Chat or  Rehab Office [phone]

## 2022-08-12 NOTE — Progress Notes (Addendum)
Advanced Heart Failure Rounding Note  PCP-Cardiologist: None   Subjective:    6/25 Started on amio drip. PICC placed  CO-OX 38%-->43%->51>54%   This morning in NSR  CVP not set up, cord not in room. Sitting up in chair. Saw palliative this morning. Overall feels fine, breathing stable.    R/LHC 6/26 Ao = 85/61 (73) LV = 84/17 RA =  9 RV = 37/11 PA = 34/19 (27) PCW = 20 (v waves to 30) Fick cardiac output/index = 6.1/2.7 Thermo CO/CI = 4.3/1.9 PVR = 1.6 (TD) SVR = 1194 FA sat = 96% PA sat = 53%, 55% LVEF 10% markedly dilated 1. Minimal CAD 2. Severe NICM EF 10% 3. Mildly elevated filling pressures with severely reduced CO by Thermo  Objective:   Weight Range: 93.1 kg Body mass index is 24.98 kg/m.   Vital Signs:   Temp:  [97.9 F (36.6 C)-98.6 F (37 C)] 97.9 F (36.6 C) (06/28 0406) Pulse Rate:  [67-77] 67 (06/28 0406) Resp:  [18-27] 19 (06/28 0406) BP: (100-133)/(71-85) 100/71 (06/28 0406) SpO2:  [91 %-96 %] 95 % (06/28 0406) Weight:  [93.1 kg] 93.1 kg (06/28 0650) Last BM Date : 08/11/22  Weight change: Filed Weights   08/10/22 0606 08/11/22 0720 08/12/22 0650  Weight: 95.8 kg 93.2 kg 93.1 kg    Intake/Output:   Intake/Output Summary (Last 24 hours) at 08/12/2022 0802 Last data filed at 08/12/2022 0200 Gross per 24 hour  Intake 358 ml  Output 900 ml  Net -542 ml      Physical Exam  CVP not set up General:  elderly appearing.  No respiratory difficulty HEENT: normal, HOH Neck: supple. JVD ~7 cm. Carotids 2+ bilat; no bruits. No lymphadenopathy or thyromegaly appreciated. Cor: PMI nondisplaced. Regular rate & rhythm. No rubs, gallops or murmurs. Lungs: clear Abdomen: soft, nontender, nondistended. No hepatosplenomegaly. No bruits or masses. Good bowel sounds. Extremities: no cyanosis, clubbing, rash, edema  Neuro: alert & oriented x 3, cranial nerves grossly intact. moves all 4 extremities w/o difficulty. Affect pleasant.   Telemetry     A fib vs NSR 60s with PVCs, brief NSVT (Personally reviewed)    EKG    N/A   Labs    CBC Recent Labs    08/11/22 0001 08/12/22 0415  WBC 10.2 12.7*  HGB 9.1* 9.4*  HCT 31.2* 31.7*  MCV 78.2* 78.9*  PLT 229 204   Basic Metabolic Panel Recent Labs    16/10/96 0001 08/12/22 0415  NA 134* 135  K 3.4* 3.8  CL 98 100  CO2 22 22  GLUCOSE 259* 182*  BUN 20 18  CREATININE 1.54* 1.62*  CALCIUM 8.7* 8.8*  MG 2.2 2.3   Liver Function Tests No results for input(s): "AST", "ALT", "ALKPHOS", "BILITOT", "PROT", "ALBUMIN" in the last 72 hours.  No results for input(s): "LIPASE", "AMYLASE" in the last 72 hours. Cardiac Enzymes No results for input(s): "CKTOTAL", "CKMB", "CKMBINDEX", "TROPONINI" in the last 72 hours.  BNP: BNP (last 3 results) Recent Labs    08/07/22 0719  BNP 872.8*    ProBNP (last 3 results) No results for input(s): "PROBNP" in the last 8760 hours.   D-Dimer No results for input(s): "DDIMER" in the last 72 hours. Hemoglobin A1C Recent Labs    08/11/22 0001  HGBA1C 6.2*   Fasting Lipid Panel Recent Labs    08/11/22 0001  CHOL 156  HDL 35*  LDLCALC 100*  TRIG 103  CHOLHDL 4.5  Thyroid Function Tests Recent Labs    08/11/22 0001  TSH 2.985    Other results:   Imaging    VAS US DOPPLER PRE VAD  Result Date: 08/11/2022 PERIOPERATIVE VASCULAR EVALUATION Patient Name:  Austin [name]  Date of Exam:   08/11/2022 Medical Rec #: [id number]         Accession #:    [id number] Date of Birth: 1955/06/13          Patient Gender: M Patient Age:   67 years Exam [facility]  [facility] Procedure:      VAS US DOPPLER PRE VAD Referring Phys: Reuel Boom Yona Stansbury --------------------------------------------------------------------------------  Indications:      Pre-VAD. Risk Factors:     Hypertension, hyperlipidemia. Limitations:      Patient movement and speech disturbance Comparison Study: No previous studies Performing Technologist: McKayla  Maag RVT, [place]  Examination Guidelines: A complete evaluation includes B-mode imaging, spectral Doppler, color Doppler, and power Doppler as needed of all accessible portions of each vessel. Bilateral testing is considered an integral part of a complete examination. Limited examinations for reoccurring indications may be performed as noted.  Right Carotid Findings: +----------+--------+--------+--------+--------------------------+--------+           PSV cm/sEDV cm/sStenosisDescribe                  Comments +----------+--------+--------+--------+--------------------------+--------+ CCA Prox  96      45                                                 +----------+--------+--------+--------+--------------------------+--------+ CCA Distal63      29                                                 +----------+--------+--------+--------+--------------------------+--------+ ICA Prox  78      25      1-39%   irregular and heterogenous         +----------+--------+--------+--------+--------------------------+--------+ ICA Distal74      33                                                 +----------+--------+--------+--------+--------------------------+--------+ ECA       74      21                                                 +----------+--------+--------+--------+--------------------------+--------+ +----------+--------+-------+----------------+------------+           PSV cm/sEDV cmsDescribe        Arm Pressure +----------+--------+-------+----------------+------------+ Subclavian96             Multiphasic, WNL             +----------+--------+-------+----------------+------------+ +---------+--------+--+--------+--+---------+ VertebralPSV cm/s38EDV cm/s15Antegrade +---------+--------+--+--------+--+---------+ Left Carotid Findings: +----------+-------+-------+--------+------------------------+-----------------+           PSV    EDV    StenosisDescribe                 Comments  cm/s   cm/s                                                     +----------+-------+-------+--------+------------------------+-----------------+ CCA Prox  90     35                                                       +----------+-------+-------+--------+------------------------+-----------------+ CCA Distal73     32                                     intimal                                                                   thickening        +----------+-------+-------+--------+------------------------+-----------------+ ICA Prox  50     28     1-39%   irregular and                                                             heterogenous                              +----------+-------+-------+--------+------------------------+-----------------+ ICA Distal89     38                                                       +----------+-------+-------+--------+------------------------+-----------------+ ECA       39     9                                                        +----------+-------+-------+--------+------------------------+-----------------+ +----------+--------+--------+----------------+------------+ SubclavianPSV cm/sEDV cm/sDescribe        Arm Pressure +----------+--------+--------+----------------+------------+           85              Multiphasic, [id number]          +----------+--------+--------+----------------+------------+ +---------+--------+--+--------+--+---------+ VertebralPSV cm/s19EDV cm/s12Antegrade +---------+--------+--+--------+--+---------+  ABI Findings: +---------+------------------+-----+---------+---------------------------------+ Right    Rt Pressure (mmHg)IndexWaveform Comment                           +---------+------------------+-----+---------+---------------------------------+ Brachial                        triphasicNo pressure  obtained due to IV                                              placement                         +---------+------------------+-----+---------+---------------------------------+ PTA      145               1.28 triphasic                                  +---------+------------------+-----+---------+---------------------------------+ DP       137               1.21 biphasic                                   +---------+------------------+-----+---------+---------------------------------+ Great Toe                       Absent                                     +---------+------------------+-----+---------+---------------------------------+ +---------+------------------+-----+--------+-------+ Left     Lt Pressure (mmHg)IndexWaveformComment +---------+------------------+-----+--------+-------+ Brachial 113                    biphasic        +---------+------------------+-----+--------+-------+ PTA      147               1.30 biphasic        +---------+------------------+-----+--------+-------+ DP       122               1.08 biphasic        +---------+------------------+-----+--------+-------+ Pat Kocher                     Abnormal        +---------+------------------+-----+--------+-------+ +-------+---------------+----------------+ ABI/TBIToday's ABI/TBIPrevious ABI/TBI +-------+---------------+----------------+ Right  1.28/ Absent                    +-------+---------------+----------------+ Left   1.30/ 0.35                      +-------+---------------+----------------+  Summary: Right Carotid: Velocities in the right ICA are consistent with a 1-39% stenosis. Left Carotid: Velocities in the left ICA are consistent with a 1-39% stenosis. Vertebrals:  Bilateral vertebral arteries demonstrate antegrade flow. Subclavians: Normal flow hemodynamics were seen in bilateral subclavian              arteries.  *See table(s) above for measurements and observations. Right ABI: Resting  right ankle-brachial index is within normal range. Left ABI: Resting left ankle-brachial index is within normal range. The left toe-brachial index is abnormal.  Electronically signed by Gerarda Fraction on 08/11/2022 at 7:40:18 PM.    Final    VAS [place] LOWER EXTREMITY VENOUS (DVT)  Result Date: 08/11/2022  Lower Venous DVT Study Patient Name:  ANTAEUS RIDEOUT  Date of Exam:   08/11/2022 Medical Rec #: [id number]         Accession #:  [id number] Date of Birth: 26-Mar-1955          Patient Gender: M Patient Age:   44 years Exam Location:  [facility] Procedure:      VAS [place] LOWER EXTREMITY VENOUS (DVT) Referring Phys: Reuel Boom Mayukha Symmonds --------------------------------------------------------------------------------  Indications: Pre-VAD.  Comparison Study: No previous study. Performing Technologist: McKayla Maag RVT, [place]  Examination Guidelines: A complete evaluation includes B-mode imaging, spectral Doppler, color Doppler, and power Doppler as needed of all accessible portions of each vessel. Bilateral testing is considered an integral part of a complete examination. Limited examinations for reoccurring indications may be performed as noted. The reflux portion of the exam is performed with the patient in reverse Trendelenburg.  +---------+---------------+---------+-----------+----------+--------------+ RIGHT    CompressibilityPhasicitySpontaneityPropertiesThrombus Aging +---------+---------------+---------+-----------+----------+--------------+ CFV      Full           Yes      Yes                                 +---------+---------------+---------+-----------+----------+--------------+ SFJ      Full                                                        +---------+---------------+---------+-----------+----------+--------------+ FV Prox  Full                                                        +---------+---------------+---------+-----------+----------+--------------+ FV Mid   Full                                                         +---------+---------------+---------+-----------+----------+--------------+ FV DistalFull                                                        +---------+---------------+---------+-----------+----------+--------------+ PFV      Full                                                        +---------+---------------+---------+-----------+----------+--------------+ POP      Full           Yes      Yes                                 +---------+---------------+---------+-----------+----------+--------------+ PTV      Full                                                        +---------+---------------+---------+-----------+----------+--------------+  PERO     Full                                                        +---------+---------------+---------+-----------+----------+--------------+   +---------+---------------+---------+-----------+----------+--------------+ LEFT     CompressibilityPhasicitySpontaneityPropertiesThrombus Aging +---------+---------------+---------+-----------+----------+--------------+ CFV      Full           Yes      Yes                                 +---------+---------------+---------+-----------+----------+--------------+ SFJ      Full                                                        +---------+---------------+---------+-----------+----------+--------------+ FV Prox  Full                                                        +---------+---------------+---------+-----------+----------+--------------+ FV Mid   Full                                                        +---------+---------------+---------+-----------+----------+--------------+ FV DistalFull                                                        +---------+---------------+---------+-----------+----------+--------------+ PFV      Full                                                         +---------+---------------+---------+-----------+----------+--------------+ POP      Full           Yes      Yes                                 +---------+---------------+---------+-----------+----------+--------------+ PTV      Full                                                        +---------+---------------+---------+-----------+----------+--------------+ PERO     Full                                                        +---------+---------------+---------+-----------+----------+--------------+  Summary: BILATERAL: - No evidence of deep vein thrombosis seen in the lower extremities, bilaterally. - No evidence of superficial venous thrombosis in the lower extremities, bilaterally. -No evidence of popliteal cyst, bilaterally.   *See table(s) above for measurements and observations. Electronically signed by Gerarda Fraction on 08/11/2022 at 7:39:54 PM.    Final    DG CHEST PORT 1 VIEW  Result Date: 08/11/2022 CLINICAL DATA:  [id number] Dyspnea [id number] EXAM: PORTABLE CHEST 1 VIEW COMPARISON:  Chest x-ray 08/09/2022, CT chest 08/07/2022 FINDINGS: Left chest wall single lead cardiac defibrillator. Right PICC with tip overlying the expected region of the superior caval junction Enlarged cardiac silhouette. The heart and mediastinal contours are unchanged. No focal consolidation. No pulmonary edema. Likely trace bilateral pleural effusion. No pneumothorax. No acute osseous abnormality. IMPRESSION: Cardiomegaly with likely trace bilateral pleural effusions. Electronically Signed   By: Tish Frederickson M.D.   On: 08/11/2022 17:52   DG Orthopantogram  Result Date: 08/11/2022 CLINICAL DATA:  Preoperative assessment. EXAM: ORTHOPANTOGRAM/PANORAMIC COMPARISON:  None Available. FINDINGS: Most of the teeth are missing. The remaining teeth do not demonstrate any periapical abscess. The mandible is normal. IMPRESSION: No significant findings. Electronically Signed   By: Rudie Meyer M.D.    On: 08/11/2022 12:35     Medications:     Scheduled Medications:  apixaban  5 mg Oral BID   Chlorhexidine Gluconate Cloth  6 each Topical Daily   vitamin B-12  1,000 mcg Oral Daily   dicyclomine  10 mg Oral BID WC   digoxin  0.125 mg Oral Daily   empagliflozin  10 mg Oral Daily   feeding supplement  237 mL Oral BID BM   finasteride  5 mg Oral Daily   melatonin  3 mg Oral QHS   multivitamin  1 tablet Oral QHS   pantoprazole  40 mg Oral Daily   polyethylene glycol  17 g Oral Daily   pravastatin  40 mg Oral Daily   tamsulosin  0.4 mg Oral QPC supper    Infusions:  sodium chloride     amiodarone 60 mg/hr (08/12/22 0545)    PRN Medications: acetaminophen, albuterol, ondansetron (ZOFRAN) IV  Patient Profile  Mr Baray is 67 year old with a history of chronic HFrEF, HTN, HLD, nonobstructive CAD 2012, CKD Stage III, PAF, and single chamber Biotronik. Admitted with A/C HFrEF + A fib.  Assessment/Plan  1. A/C HFrEF, NICM  Initially diagnosed with HFrEF in 2012. EF previously  improved to 50-55% on medications. He had been lost to follow up in the HF clinic at [facility] and had been followed at [facility]. Most recent cath 2015 showed nonobstructive LCX OM 20%.  In 2022 EF went back down 25-30% and has continued to drop. EF now < 20%. He does have single chamber Biotronic.  Over the last 6 months he has had multiple heart failure admissions.  NICM. LHC this admit w/ minimal CAD It is possible cardiomyopathy is from high PVC burden.  RHC c/w severe NICM w/ mildly elevated filling pressures and severely reduced CO/CI by thermo 4.3/1.9. Co-ox 54% today. CVP not set up Continue to try to suppress PVCs. If EF not improving w/ in 4-6 weeks w/ suppression, will need to consider LVAD. LVAD team has seen. - Continue Jardiance 10 mg  - Continue Digoxin 0.125 mg daily - Continue spiro 12.5 mg daily  - Transition to PO Lasix 40 mg daily  - BP currently stable, was on entresto at home. Will  hold off for now. - Eventually may need CMRI but he had IV Iron this admit which could interfere with results.   2 A fib  Last in SR back in March of this year. Back in A fib in May and on admit.  -Continue to load amio.  -TEE/DC-CV today, may be back in NSR. Will verify with EKG this morning -Continue eliquis 5 mg twice a day.   3. CKD Stage IIIa -Creatinine baseline ~1.  -Creatinine 1.6 today. Follow BMET.    4. Anemia, IDA -Iron sats 2%.  -Given IV iron.   5. RV Lead ? 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  However, need to rule out vegetation. WBCs normal and afebrile.  -He will have TEE with DCCV to assess.   -BCx NGTD    6. PVCs - High PVC burden. ~ 50% ! - Improving w/ amiodarone, <10/min on tele  - continue amio - supp K   7. Hypokalemia - K 3.8 - spiro added yesterday - supp K   Alen Bleacher AGACNP-BC  8:02 AM  Patient seen and examined with the above-signed Advanced Practice Provider and/or Housestaff. I personally reviewed laboratory data, imaging studies and relevant notes. I independently examined the patient and formulated the important aspects of the plan. I have edited the note to reflect any of my changes or salient points. I have personally discussed the plan with the patient and/or family.  Now back in NSR. PVCs suppressed. Feels ok. Co-ox improved but remains marginal   General:  Well appearing. No resp difficulty HEENT: normal Neck: supple. no JVD. Carotids 2+ bilat; no bruits. No lymphadenopathy or thryomegaly appreciated. Cor: PMI nondisplaced. Regular rate & rhythm. No rubs, gallops or murmurs. Lungs: clear Abdomen: soft, nontender, nondistended. No hepatosplenomegaly. No bruits or masses. Good bowel sounds. Extremities: no cyanosis, clubbing, rash, edema Neuro: alert & orientedx3, cranial nerves grossly intact. moves all 4 extremities w/o difficulty. Affect pleasant  Ok to go home  today. Will need close f/u in HF Clinic, if EF not improving will need VAD consideration.   Meds for d/c  Amio 200 bid Eliquis 5 bid Jardiance 10 daily Digoxin 0.125 Spiro 12.5 daily (decreased from 25) Torsemide 40 daily Losartan 12.5 daily   (Stop carvedilol, bumex and Entresto)  Arvilla Meres, MD  12:02 PM

## 2022-08-12 NOTE — TOC Progression Note (Signed)
Transition of Care [facility]) - Progression Note    Patient Details  Name: Austin [name] MRN: [id number] Date of Birth: [birth year]  [facility]) CM/SW Contact  Elliot Cousin, RN Phone Number: [phone] 08/12/2022, 1:54 PM  Clinical Narrative:  Patiented walked in halls and oxygen checked. Oxygen not needed. Pt reports having scale at home for daily weights. Has family to assist with care.      Expected Discharge Plan: Home/Self Care Barriers to Discharge: No Barriers Identified  Expected Discharge Plan and Services       Living arrangements for the past 2 months: Apartment Expected Discharge Date: 08/12/22                                     Social Determinants of Health (SDOH) Interventions SDOH Screenings   Food Insecurity: No Food Insecurity (08/07/2022)  Housing: Low Risk  (08/07/2022)  Transportation Needs: No Transportation Needs (08/07/2022)  Utilities: Not At Risk (08/07/2022)  Tobacco Use: Medium Risk (08/11/2022)    Readmission Risk Interventions     No data to display

## 2022-08-12 NOTE — Progress Notes (Signed)
PICC Removal Note:  Per MD order, PICC line removed from patient's RUA. PICC catheter tip visualized and intact. Pressure held until hemostasis achieved and pressure dressing applied. No redness, ecchymosis, edema, swelling, or drainage noted at site. Instructions provided on post PICC discharge care, including bedrest for 30 minutes post removal (1600).

## 2022-08-12 NOTE — Progress Notes (Signed)
Brief MCS Note:  Pt discharging home today per Dr [name]. Pt in SR today. Per Dr [name] will continue to try to suppress PVCs. If EF not improving w/ in 4-6 weeks w/ suppression, will need to consider LVAD. VAD coordinators will continue to follow along outpatient for readiness for VAD.   Pt has follow-up appt scheduled in Heart Failure Clinic 08/22/22.   Alyce Pagan RN VAD Coordinator  Office: [phone]  24/7 Pager: [phone]

## 2022-08-12 NOTE — Discharge Instructions (Signed)
Dear Austin [name],  Thank you for letting us participate in your care. You were hospitalized for heart failure atrial fibrillation where you were evaluated by cardiology and underwent a left heart catheterization.  Your rhythm improved with a new medication called amiodarone.  You will need close follow-up with the heart failure clinic.  That your heart is not pumping blood as well.  POST-HOSPITAL & CARE INSTRUCTIONS Please make sure to closely follow your new medications as below. Please make sure to go to your follow-up appointment with the heart failure team. Go to your follow up appointments (listed below)   DOCTOR'S APPOINTMENT   Future Appointments  Date Time Provider Department Center  08/15/2022  9:00 AM [facility] TECH [facility] None  08/22/2022 12:00 PM [facility] PA/NP [facility] None    Follow-up Information     [facility] Follow up on 08/22/2022.   Specialty: Cardiology Why: Advanced Heart Failure Clinic 12 pm Entrance C, Free Valet Parking Contact information: [address] [id number] Wilhemina Bonito [place] [address] [phone]                Take care and be well!  Family Medicine Teaching Service Inpatient Team [place]  [facility]  [address] [place], [place] [address] [phone]

## 2022-08-12 NOTE — TOC Transition Note (Deleted)
Transition of Care [facility]) - CM/SW Discharge Note   Patient Details  Name: Austin [name] MRN: [id number] Date of Birth: 09-17-1955  Transition [facility]) CM/SW Contact:  Reva Bores, LCSWA Phone Number: 08/12/2022, 3:12 PM   Clinical Narrative:  CSW met with pt and sister at his beside to discuss future follow up appointments. CSW contacted PCP and the office stated that they will call him to schedule the follow up appt. Pt sister stated that she was retuning home this evening. Pt stated that he has a scale at home. Pt stated that he needed a note for work.     Final next level of care: Home/Self Care Barriers to Discharge: No Barriers Identified   Patient Goals and CMS Choice      Discharge Placement                         Discharge Plan and Services Additional resources added to the After Visit Summary for                                       Social Determinants of Health (SDOH) Interventions SDOH Screenings   Food Insecurity: No Food Insecurity (08/07/2022)  Housing: Low Risk  (08/07/2022)  Transportation Needs: No Transportation Needs (08/07/2022)  Utilities: Not At Risk (08/07/2022)  Tobacco Use: Medium Risk (08/11/2022)     Readmission Risk Interventions     No data to display

## 2022-08-12 NOTE — Plan of Care (Signed)
  Problem: Coping: Goal: Level of anxiety will decrease Outcome: Progressing   Problem: Elimination: Goal: Will not experience complications related to bowel motility Outcome: Progressing Goal: Will not experience complications related to urinary retention Outcome: Progressing   Problem: Pain Managment: Goal: General experience of comfort will improve Outcome: Progressing   

## 2022-08-12 NOTE — Progress Notes (Signed)
[facility]          [address]       [name] [address]             [phone]          CARDIOTHORACIC SURGERY CONSULTATION REPORT  PCP is Lulu Riding, [place] Scrape, NP Referring Provider is Nicholes Mango, MD Primary Cardiologist is Dr. Mellody Drown, [facility]  Reason for consultation: Acute on chronic systolic heart failure  HPI:  The patient is a 67 year old gentleman with history of hyperlipidemia, stage III chronic kidney disease, atrial fibrillation, and anemia who was initially diagnosed with HFrEF in 2012.  His EF improved to 50 to 55% with medical therapy.  He was followed in the heart failure clinic at [facility] but then was lost to follow-up and was followed at [facility] heart failure clinic.  In 2022 his ejection fraction dropped back down to 25 to 30% and has continued to deteriorate.  It is now less than 20% on echocardiogram.  His LV diastolic dimension was 8 cm.  There is moderate RV dysfunction.  Over the last 6 months he has had multiple heart failure admissions and now was admitted with shortness of breath, fatigue, and abdominal discomfort radiating into the chest, neck, and shoulders.  It was felt that his cardiomyopathy may be due to high PVC burden.  A PICC line was placed and Co-ox was 38% and 43%.  Right/left heart catheterization showed:     Prox LAD to Mid LAD lesion is 20% stenosed.   The left ventricular ejection fraction is less than 25% by visual estimate.   Findings:  Ao = 85/61 (73) LV = 84/17 RA =  9 RV = 37/11 PA = 34/19 (27) PCW = 20 (v waves to 30) Fick cardiac output/index = 6.1/2.7 Thermo CO/CI = 4.3/1.9 PVR = 1.6 (TD) SVR = 1194 FA sat = 96% PA sat = 53%, 55% LVEF 10% markedly dilated   He was started on amiodarone with plans for TEE directed cardioversion today with the hope of maintaining sinus rhythm with a low PVC burden which was about 50%.  He spontaneously converted to sinus rhythm and has  had few PVCs.  Past Medical History:  Diagnosis Date   Benign prostatic hypertrophy    Hyperlipidemia    Nonischemic cardiomyopathy [facility]) Oct 2012   cath 12/10/10 minimal nonobstructive coronary artery disease   Systolic heart failure Oct 2012   echo 12/08/10 EF 20%, severe left ventricular enlargement, mild right ventricular enlargement    Past Surgical History:  Procedure Laterality Date   HAND SURGERY  1979   left hand machine trauma   RIGHT/LEFT HEART CATH AND CORONARY ANGIOGRAPHY N/A 08/10/2022   Procedure: RIGHT/LEFT HEART CATH AND CORONARY ANGIOGRAPHY;  Surgeon: Dolores Patty, MD;  Location: [facility] INVASIVE CV LAB;  Service: Cardiovascular;  Laterality: N/A;    Family History  Problem Relation Age of Onset   Breast cancer Mother    Cancer Father    Heart attack Brother    Heart attack Brother 82    Social History   Socioeconomic History   Marital status: Single    Spouse name: Not on file   Number of children: Not on file   Years of education: Not on file   Highest education level: Not on file  Occupational History   Not on file  Tobacco Use   Smoking status: Former    Packs/day: 2.00    Years:  14.00    Additional pack years: 0.00    Total pack years: 28.00    Types: Cigarettes    Quit date: 02/14/1993    Years since quitting: 29.5   Smokeless tobacco: Not on file  Substance and Sexual Activity   Alcohol use: Yes    Comment: occasionally, once a month   Drug use: Not on file   Sexual activity: Not on file  Other Topics Concern   Not on file  Social History Narrative   He is single and lives in [place].  He drives a refrigerator truck.   Social Determinants of Health   Financial Resource Strain: Not on file  Food Insecurity: No Food Insecurity (08/07/2022)   Hunger Vital Sign    Worried About Running Out of Food in the Last Year: Never true    Ran Out of Food in the Last Year: Never true  Transportation Needs: No Transportation Needs  (08/07/2022)   PRAPARE - Administrator, Civil Service (Medical): No    Lack of Transportation (Non-Medical): No  Physical Activity: Not on file  Stress: Not on file  Social Connections: Not on file  Intimate Partner Violence: Not At Risk (08/07/2022)   Humiliation, Afraid, Rape, and Kick questionnaire    Fear of Current or Ex-Partner: No    Emotionally Abused: No    Physically Abused: No    Sexually Abused: No    Prior to Admission medications   Medication Sig Start Date End Date Taking? Authorizing Provider  acetaminophen (TYLENOL) 325 MG tablet Take 2 tablets (650 mg total) by mouth every 4 (four) hours as needed for headache or mild pain. 08/12/22   Celine Mans, MD  albuterol (VENTOLIN HFA) 108 (90 Base) MCG/ACT inhaler Inhale 1-2 puffs into the lungs every 6 (six) hours as needed. 05/03/22 05/03/23  [provider]  amiodarone (PACERONE) 200 MG tablet Take 1 tablet (200 mg total) by mouth 2 (two) times daily. 08/12/22   Celine Mans, MD  apixaban (ELIQUIS) 5 MG TABS tablet Take 5 mg by mouth 2 (two) times daily. 05/25/22   [provider]  Cholecalciferol 50 MCG (2000 UT) TABS Take 1 tablet by mouth daily. 06/11/19   [provider]  digoxin (LANOXIN) 0.125 MG tablet Take 1 tablet (0.125 mg total) by mouth daily. 08/13/22   Celine Mans, MD  fenofibrate micronized (LOFIBRA) 67 MG capsule Take 67 mg by mouth every morning. 05/25/22   [provider]  ferrous sulfate 325 (65 FE) MG tablet Take 325 mg by mouth. 05/29/19   [provider]  finasteride (PROSCAR) 5 MG tablet Take 1 tablet by mouth daily. 05/08/19   [provider]  hydrocortisone 2.5 % cream Apply 1 Application topically. 12/30/12   [provider]  JARDIANCE 10 MG TABS tablet Take 10 mg by mouth daily. 03/28/22   [provider]  ketoconazole (NIZORAL) 2 % shampoo Apply 1 Application topically 3 (three) times a week. 12/30/12   [provider]  loratadine (CLARITIN) 10 MG tablet Take 10 mg by mouth daily. 12/30/12   [provider]  losartan (COZAAR) 25 MG tablet Take 0.5 tablets (12.5 mg total) by mouth daily. 08/12/22   Celine Mans, MD  melatonin 3 MG TABS tablet Take 3 mg by mouth at bedtime. 02/03/22   [provider]  omeprazole (PRILOSEC) 20 MG capsule Take 20 mg by mouth daily.    [provider]  pravastatin (PRAVACHOL) 40 MG tablet Take  1 tablet (40 mg total) by mouth daily. 01/24/11 08/07/22  Bensimhon, Bevelyn Buckles, MD  spironolactone (ALDACTONE) 25 MG tablet Take 0.5 tablets (12.5 mg total) by mouth daily. 08/13/22   Celine Mans, MD  Tamsulosin HCl (FLOMAX) 0.4 MG CAPS Take 0.4 mg by mouth daily after supper.    [provider]  torsemide (DEMADEX) 20 MG tablet Take 2 tablets (40 mg total) by mouth daily. 08/12/22   Celine Mans, MD    Current Outpatient Medications  Medication Sig Dispense Refill   acetaminophen (TYLENOL) 325 MG tablet Take 2 tablets (650 mg total) by mouth every 4 (four) hours as needed for headache or mild pain.     albuterol (VENTOLIN HFA) 108 (90 Base) MCG/ACT inhaler Inhale 1-2 puffs into the lungs every 6 (six) hours as needed.     amiodarone (PACERONE) 200 MG tablet Take 1 tablet (200 mg total) by mouth 2 (two) times daily. 60 tablet 0   apixaban (ELIQUIS) 5 MG TABS tablet Take 5 mg by mouth 2 (two) times daily.     Cholecalciferol 50 MCG (2000 UT) TABS Take 1 tablet by mouth daily.     [START ON 08/13/2022] digoxin (LANOXIN) 0.125 MG tablet Take 1 tablet (0.125 mg total) by mouth daily. 30 tablet 0   fenofibrate micronized (LOFIBRA) 67 MG capsule Take 67 mg by mouth every morning.     ferrous sulfate 325 (65 FE) MG tablet Take 325 mg by mouth.     finasteride (PROSCAR) 5 MG tablet Take 1 tablet by mouth daily.     hydrocortisone 2.5 % cream Apply 1 Application topically.     JARDIANCE 10 MG TABS tablet Take 10 mg by mouth daily.      ketoconazole (NIZORAL) 2 % shampoo Apply 1 Application topically 3 (three) times a week.     loratadine (CLARITIN) 10 MG tablet Take 10 mg by mouth daily.     losartan (COZAAR) 25 MG tablet Take 0.5 tablets (12.5 mg total) by mouth daily. 15 tablet 0   melatonin 3 MG TABS tablet Take 3 mg by mouth at bedtime.     omeprazole (PRILOSEC) 20 MG capsule Take 20 mg by mouth daily.     pravastatin (PRAVACHOL) 40 MG tablet Take 1 tablet (40 mg total) by mouth daily. 90 tablet 2   [START ON 08/13/2022] spironolactone (ALDACTONE) 25 MG tablet Take 0.5 tablets (12.5 mg total) by mouth daily. 15 tablet 0   Tamsulosin HCl (FLOMAX) 0.4 MG CAPS Take 0.4 mg by mouth daily after supper.     torsemide (DEMADEX) 20 MG tablet Take 2 tablets (40 mg total) by mouth daily. 60 tablet 0   No current facility-administered medications for this visit.   Facility-Administered Medications Ordered in Other Visits  Medication Dose Route Frequency Provider Last Rate Last Admin   0.9 %  sodium chloride infusion   Intravenous Continuous Laurey Morale, MD       acetaminophen (TYLENOL) tablet 650 mg  650 mg Oral Q4H PRN Bensimhon, Bevelyn Buckles, MD   650 mg at 08/11/22 1322   albuterol (PROVENTIL) (2.5 MG/3ML) 0.083% nebulizer solution 3 mL  3 mL Inhalation Q6H PRN Bensimhon, Bevelyn Buckles, MD   3 mL at 08/11/22 2134   amiodarone (PACERONE) tablet 200 mg  200 mg Oral BID Bensimhon, Bevelyn Buckles, MD   200 mg at 08/12/22 1200   apixaban (ELIQUIS) tablet 5 mg  5 mg Oral BID Bensimhon, Bevelyn Buckles, MD   5 mg at  08/12/22 0918   Chlorhexidine Gluconate Cloth 2 % PADS 6 each  6 each Topical Daily Bensimhon, Bevelyn Buckles, MD   6 each at 08/12/22 1610   cyanocobalamin (VITAMIN B12) tablet 1,000 mcg  1,000 mcg Oral Daily Bensimhon, Bevelyn Buckles, MD   1,000 mcg at 08/12/22 9604   dicyclomine (BENTYL) capsule 10 mg  10 mg Oral BID WC Bensimhon, Bevelyn Buckles, MD   10 mg at 08/12/22 0645   digoxin (LANOXIN) tablet 0.125 mg  0.125 mg Oral Daily Bensimhon, Bevelyn Buckles, MD    0.125 mg at 08/12/22 5409   empagliflozin (JARDIANCE) tablet 10 mg  10 mg Oral Daily Bensimhon, Bevelyn Buckles, MD   10 mg at 08/12/22 8119   feeding supplement (ENSURE ENLIVE / ENSURE PLUS) liquid 237 mL  237 mL Oral BID BM Doreene Eland, MD       finasteride (PROSCAR) tablet 5 mg  5 mg Oral Daily Bensimhon, Bevelyn Buckles, MD   5 mg at 08/12/22 1478   losartan (COZAAR) tablet 12.5 mg  12.5 mg Oral Daily Brynda Peon L, NP   12.5 mg at 08/12/22 1400   melatonin tablet 3 mg  3 mg Oral QHS Bensimhon, Bevelyn Buckles, MD   3 mg at 08/11/22 2123   multivitamin (RENA-VIT) tablet 1 tablet  1 tablet Oral QHS Janit Pagan T, MD   1 tablet at 08/11/22 2123   ondansetron (ZOFRAN) injection 4 mg  4 mg Intravenous Q6H PRN Bensimhon, Bevelyn Buckles, MD   4 mg at 08/11/22 1250   pantoprazole (PROTONIX) EC tablet 40 mg  40 mg Oral Daily Bensimhon, Bevelyn Buckles, MD   40 mg at 08/12/22 2956   polyethylene glycol (MIRALAX / GLYCOLAX) packet 17 g  17 g Oral Daily Bensimhon, Bevelyn Buckles, MD   17 g at 08/12/22 2130   pravastatin (PRAVACHOL) tablet 40 mg  40 mg Oral Daily Bensimhon, Bevelyn Buckles, MD   40 mg at 08/12/22 8657   spironolactone (ALDACTONE) tablet 12.5 mg  12.5 mg Oral Daily Shelby Mattocks, DO   12.5 mg at 08/12/22 1201   tamsulosin (FLOMAX) capsule 0.4 mg  0.4 mg Oral QPC supper Bensimhon, Bevelyn Buckles, MD   0.4 mg at 08/11/22 1652   torsemide (DEMADEX) tablet 40 mg  40 mg Oral Daily Brynda Peon L, NP   40 mg at 08/12/22 1400    No Known Allergies    Review of Systems:   General:  normal appetite, + decreased energy, no weight gain, no weight loss, no fever  Cardiac:  + chest pain with exertion, + chest pain at rest, +SOB with mile exertion, + resting SOB, no PND, + orthopnea, + palpitations, + arrhythmia, + atrial fibrillation, + LE edema, no dizzy spells, no syncope  Respiratory:  + shortness of breath, no home oxygen, no productive cough, no dry cough, no bronchitis, no wheezing, no hemoptysis, no asthma, no pain with inspiration or  cough, no sleep apnea, no CPAP at night  GI:   no difficulty swallowing, no reflux, no frequent heartburn, no hiatal hernia, + abdominal pain, no constipation, no diarrhea, no hematochezia, no hematemesis, no melena  GU:   no dysuria,  no frequency, no urinary tract infection, no hematuria, no enlarged prostate, no kidney stones, + chronic kidney disease  Vascular:  no pain suggestive of claudication, no pain in feet, no leg cramps, no varicose veins, no DVT, no non-healing foot ulcer  Neuro:   no stroke, no TIA's, no seizures, no  headaches, no temporary blindness one eye,  no slurred speech, no peripheral neuropathy, no chronic pain, no instability of gait, no memory/cognitive dysfunction  Musculoskeletal: no arthritis, no joint swelling, no myalgias, no difficulty walking, normal mobility   Skin:   no rash, no itching, no skin infections, no pressure sores or ulcerations  Psych:   no anxiety, no depression, no nervousness, no unusual recent stress  Eyes:   no blurry vision, no floaters, no recent vision changes, no  glasses or contacts  ENT:   + hearing loss, no loose or painful teeth, no dentures, last saw dentist every 6 months  Hematologic:  no easy bruising, no abnormal bleeding, no clotting disorder, no frequent epistaxis  Endocrine:  no diabetes, does not check CBG's at home     Physical Exam:   There were no vitals taken for this visit.  General:  Well built gentleman in no distress,  well-appearing  HEENT:  Unremarkable, NCAT, PERLA, EOMI  Neck:   no JVD, no bruits, no adenopathy   Chest:   clear to auscultation, symmetrical breath sounds, no wheezes, no rhonchi   CV:   RRR, no murmur   Abdomen:  soft, non-tender, no masses   Extremities:  warm, well-perfused, pedal pulses palpable, no lower extremity edema  Rectal/GU  Deferred  Neuro:   Grossly non-focal and symmetrical throughout  Skin:   Clean and dry, no rashes, no breakdown  Diagnostic Tests:  Lab Results: Recent Labs     08/11/22 0001 08/12/22 0415  WBC 10.2 12.7*  HGB 9.1* 9.4*  HCT 31.2* 31.7*  PLT 229 204   BMET:  Recent Labs    08/11/22 0001 08/12/22 0415  NA 134* 135  K 3.4* 3.8  CL 98 100  CO2 22 22  GLUCOSE 259* 182*  BUN 20 18  CREATININE 1.54* 1.62*  CALCIUM 8.7* 8.8*    Narrative & Impression  CLINICAL DATA:  Pulmonary embolism suspected, chest pain   EXAM: CT ANGIOGRAPHY CHEST WITH CONTRAST   TECHNIQUE: Multidetector CT imaging of the chest was performed using the standard protocol during bolus administration of intravenous contrast. Multiplanar CT image reconstructions and MIPs were obtained to evaluate the vascular anatomy.   RADIATION DOSE REDUCTION: This exam was performed according to the departmental dose-optimization program which includes automated exposure control, adjustment of the mA and/or kV according to patient size and/or use of iterative reconstruction technique.   CONTRAST:  75mL OMNIPAQUE IOHEXOL 350 MG/ML SOLN   COMPARISON:  08/07/2022, 12/08/2010   FINDINGS: Cardiovascular: This is a technically adequate evaluation of the pulmonary vasculature. No filling defects or pulmonary emboli.   The heart is enlarged, with prominent left ventricular dilatation. Single lead cardiac pacer seen within the right ventricular lumen. Normal caliber of the thoracic aorta. Atherosclerosis of the aorta and coronary vasculature.   Mediastinum/Nodes: No enlarged mediastinal, hilar, or axillary lymph nodes. Thyroid gland, trachea, and esophagus demonstrate no significant findings.   Lungs/Pleura: Small bilateral pleural effusions, right greater than left. No acute airspace disease or pneumothorax. The central airways are patent. Hypoventilatory changes are seen at the lung bases.   Upper Abdomen: Pericholecystic fat stranding again noted, please see earlier CT and ultrasound abdomen reports. Remainder of the upper abdomen is unremarkable.   Musculoskeletal: No  acute or destructive bony abnormalities. Reconstructed images demonstrate no additional findings.   Review of the MIP images confirms the above findings.   IMPRESSION: 1. No evidence of pulmonary embolus. 2. Cardiomegaly, with prominent left ventricular  dilatation. 3. Small bilateral pleural effusions, right greater than left. 4. Pericholecystic fat stranding partially visualized, unchanged since earlier CT. Please see previous CT and ultrasound abdomen reports. 5. Aortic Atherosclerosis (ICD10-I70.0). Coronary artery atherosclerosis.     Electronically Signed   By: Sharlet Salina M.D.   On: 08/07/2022 20:45     Narrative & Impression  CLINICAL DATA:  Abdominal pain, acute.   EXAM: CT ABDOMEN AND PELVIS WITH CONTRAST   TECHNIQUE: Multidetector CT imaging of the abdomen and pelvis was performed using the standard protocol following bolus administration of intravenous contrast.   RADIATION DOSE REDUCTION: This exam was performed according to the departmental dose-optimization program which includes automated exposure control, adjustment of the mA and/or kV according to patient size and/or use of iterative reconstruction technique.   CONTRAST:  75mL OMNIPAQUE IOHEXOL 350 MG/ML SOLN   COMPARISON:  None Available.   FINDINGS: Lower chest: Artifact from ICD wires identified within the heart. Bilateral pleural effusions, right greater than left. Asymmetric scratch ground-glass attenuation and interlobular septal thickening identified within the lung bases.   Hepatobiliary: Low attenuation structure along the lateral dome of liver measures 0.9 by 0.7 cm and is too small to reliably characterize. Focal enhancing lesion within the lateral segment of left hepatic lobe measures 1.3 cm and is favored to represent either a hemangioma or benign vascular abnormality. Gallbladder appears normal. Small stone or polyp noted within the gallbladder measuring 4 mm. Mild low-density  thickening of the gallbladder wall appears asymmetric measuring up to 5 mm. No surrounding fat stranding. No evidence for bile duct dilatation.   Pancreas: Unremarkable. No pancreatic ductal dilatation or surrounding inflammatory changes.   Spleen: The spleen appears normal in size. Three focal enhancing lesions within the spleen are noted which measure up to 5 mm, image 35/3.   Adrenals/Urinary Tract: Normal adrenal glands. No suspicious kidney mass, nephrolithiasis, or hydronephrosis. The urinary bladder is unremarkable.   Stomach/Bowel: Stomach is normal. The appendix is visualized and is within normal limits. No pathologic dilatation of the bowel loops to suggest obstruction. Sigmoid diverticulosis is identified without signs of acute diverticulitis.   Vascular/Lymphatic: Aortic atherosclerosis. No signs of abdominopelvic adenopathy.   Reproductive: Prostate gland enlargement with mass effect upon the base of bladder.   Other: No free fluid or fluid collections.   Musculoskeletal: No acute or suspicious osseous findings. L5-S1 degenerative disc disease.   IMPRESSION: 1. Bilateral pleural effusions, right greater than left. Ground-glass attenuation and interlobular septal thickening identified within the lung bases. Correlate for any clinical signs or symptoms of CHF. 2. Small stone or polyp within the gallbladder measuring 4 mm. Mild low-density thickening of the gallbladder wall appears asymmetric measuring up to 5 mm. No surrounding fat stranding. If there is a clinical concern for acute cholecystitis consider further evaluation with right upper quadrant ultrasound. 3. Three focal enhancing lesions within the spleen are noted which measure up to 5 mm. These are nonspecific and may represent small hemangiomas or benign vascular lesions. 4. Prostate gland enlargement with mass effect upon the base of bladder. 5.  Aortic Atherosclerosis (ICD10-I70.0).      Electronically Signed   By: Signa Kell M.D.   On: 08/07/2022 10:42      Narrative & Impression  CLINICAL DATA:  [id number] Abdominal pain [id number]   EXAM: ULTRASOUND ABDOMEN LIMITED RIGHT UPPER QUADRANT   COMPARISON:  CT 08/07/2022   FINDINGS: Gallbladder:   No gallstones or wall thickening visualized. No sonographic Murphy sign noted by  sonographer.   Common bile duct:   Diameter: 2 mm.   Liver:   No focal lesion identified. Mildly increased hepatic parenchymal echogenicity. Portal vein is patent on color Doppler imaging with normal direction of blood flow towards the liver.   Other: None.   IMPRESSION: 1. Unremarkable sonographic appearance of the gallbladder. 2. The echogenicity of the liver is mildly increased. This is a nonspecific finding but is most commonly seen with fatty infiltration of the liver. 3. No focal liver lesion identified sonographically to correspond to the CT findings.     Electronically Signed   By: Duanne Guess D.O.   On: 08/07/2022 12:45     Narrative & Impression  CLINICAL DATA:  Preoperative assessment.   EXAM: ORTHOPANTOGRAM/PANORAMIC   COMPARISON:  None Available.   FINDINGS: Most of the teeth are missing. The remaining teeth do not demonstrate any periapical abscess. The mandible is normal.   IMPRESSION: No significant findings.     Electronically Signed   By: Rudie Meyer M.D.   On: 08/11/2022 12:35    ECHOCARDIOGRAM REPORT       Patient Name:   Austin [name] Date of Exam: 08/08/2022  Medical Rec #:  [id number]        Height:       76.0 in  Accession #:    [id number]       Weight:       205.5 lb  Date of Birth:  December 12, 1955         BSA:          2.241 m  Patient Age:    66 years         BP:           90/65 mmHg  Patient Gender: M                HR:           86 bpm.  Exam Location:  Inpatient   Procedure: 2D Echo, 3D Echo, Cardiac Doppler, Color Doppler and  Intracardiac            Opacification Agent    Indications:    Dyspnea R06.00    History:        Patient has prior history of Echocardiogram examinations,  most                 recent 08/08/2011. CHF and Cardiomyopathy, Pacemaker,                  Arrythmias:Atrial Fibrillation, Signs/Symptoms:Dyspnea;  Risk                 Factors:Hypertension, Former Smoker and Dyslipidemia.    Sonographer:    Aron Baba  Referring Phys: [id number] CARINA M BROWN   IMPRESSIONS     1. Left ventricular ejection fraction, by estimation, is <20%. The left  ventricle has severely decreased function. The left ventricle demonstrates  global hypokinesis. The left ventricular internal cavity size was severely  dilated. Left ventricular  diastolic parameters are indeterminate.   2. Echodensity noted on device lead in RV. Cannot exclude vegetation.  Right ventricular systolic function is moderately reduced. The right  ventricular size is moderately enlarged. There is normal pulmonary artery  systolic pressure.   3. Left atrial size was mildly dilated.   4. Right atrial size was moderately dilated.   5. The mitral valve is normal in structure. Mild mitral valve  regurgitation. No evidence of mitral stenosis.  6. The aortic valve is tricuspid. Aortic valve regurgitation is not  visualized. No aortic stenosis is present.   7. Aortic dilatation noted. There is mild dilatation of the ascending  aorta, measuring 40 mm.   8. The inferior vena cava is dilated in size with >50% respiratory  variability, suggesting right atrial pressure of 8 mmHg.   Conclusion(s)/Recommendation(s): No left ventricular mural or apical  thrombus/thrombi. Severely reduced LVEF, <20% with severe dilation of LV  chamber. Echodensity noted on device lead in RV. Cannot exclude  vegetation. Findings communicated to ordering  team.   FINDINGS   Left Ventricle: Left ventricular ejection fraction, by estimation, is  <20%. The left ventricle has severely decreased function. The  left  ventricle demonstrates global hypokinesis. Definity contrast agent was  given IV to delineate the left ventricular  endocardial borders. The left ventricular internal cavity size was  severely dilated. There is no left ventricular hypertrophy. Left  ventricular diastolic parameters are indeterminate.   Right Ventricle: Echodensity noted on device lead in RV. Cannot exclude  vegetation. The right ventricular size is moderately enlarged. No increase  in right ventricular wall thickness. Right ventricular systolic function  is moderately reduced. There is  normal pulmonary artery systolic pressure. The tricuspid regurgitant  velocity is 2.19 m/s, and with an assumed right atrial pressure of 8 mmHg,  the estimated right ventricular systolic pressure is 27.2 mmHg.   Left Atrium: Left atrial size was mildly dilated.   Right Atrium: Right atrial size was moderately dilated.   Pericardium: There is no evidence of pericardial effusion.   Mitral Valve: The mitral valve is normal in structure. Mild mitral valve  regurgitation. No evidence of mitral valve stenosis.   Tricuspid Valve: The tricuspid valve is normal in structure. Tricuspid  valve regurgitation is mild . No evidence of tricuspid stenosis.   Aortic Valve: The aortic valve is tricuspid. Aortic valve regurgitation is  not visualized. No aortic stenosis is present.   Pulmonic Valve: The pulmonic valve was not well visualized. Pulmonic valve  regurgitation is trivial. No evidence of pulmonic stenosis.   Aorta: Aortic dilatation noted. There is mild dilatation of the ascending  aorta, measuring 40 mm.   Venous: The inferior vena cava is dilated in size with greater than 50%  respiratory variability, suggesting right atrial pressure of 8 mmHg.   IAS/Shunts: The atrial septum is grossly normal.   Additional Comments: A device lead is visualized in the right ventricle  and right atrium.     LEFT VENTRICLE  PLAX 2D  LVIDd:          7.80 cm   Diastology  LVIDs:         7.00 cm   LV e' medial:    7.29 cm/s  LV PW:         1.00 cm   LV E/e' medial:  14.1  LV IVS:        0.70 cm   LV e' lateral:   8.81 cm/s  LVOT diam:     2.30 cm   LV E/e' lateral: 11.7  LV SV:         40  LV SV Index:   18  LVOT Area:     4.15 cm     RIGHT VENTRICLE  RV S prime:     7.46 cm/s  TAPSE (M-mode): 1.1 cm   LEFT ATRIUM             Index  RIGHT ATRIUM           Index  LA diam:        4.10 cm 1.83 cm/m   RA Area:     27.10 cm  LA Vol (A2C):   62.2 ml 27.76 ml/m  RA Volume:   91.00 ml  40.61 ml/m  LA Vol (A4C):   62.2 ml 27.76 ml/m  LA Biplane Vol: 64.1 ml 28.61 ml/m   AORTIC VALVE             PULMONIC VALVE  LVOT Vmax:   68.00 cm/s  PR End Diast Vel: 6.45 msec  LVOT Vmean:  43.200 cm/s  LVOT VTI:    0.096 m    AORTA  Ao Root diam: 3.80 cm  Ao Asc diam:  4.00 cm   MITRAL VALVE                TRICUSPID VALVE  MV Area (PHT): 4.80 cm     TR Peak grad:   19.2 mmHg  MV Decel Time: 158 msec     TR Vmax:        219.00 cm/s  MR Peak grad: 59.0 mmHg  MR Mean grad: 33.0 mmHg     SHUNTS  MR Vmax:      384.00 cm/s   Systemic VTI:  0.10 m  MR Vmean:     266.0 cm/s    Systemic Diam: 2.30 cm  MR PISA:      2.26 cm  MV E velocity: 103.00 cm/s  MV A velocity: 45.70 cm/s  MV E/A ratio:  2.25   Jodelle Red MD  Electronically signed by Jodelle Red MD  Signature Date/Time: 08/08/2022/11:28:07 AM      hysicians  Panel Physicians Referring Physician Case Authorizing Physician  Bensimhon, Bevelyn Buckles, MD (Primary)     Procedures  RIGHT/LEFT HEART CATH AND CORONARY ANGIOGRAPHY   Conclusion      Prox LAD to Mid LAD lesion is 20% stenosed.   The left ventricular ejection fraction is less than 25% by visual estimate.   Findings:  Ao = 85/61 (73) LV = 84/17 RA =  9 RV = 37/11 PA = 34/19 (27) PCW = 20 (v waves to 30) Fick cardiac output/index = 6.1/2.7 Thermo CO/CI = 4.3/1.9 PVR = 1.6  (TD) SVR = 1194 FA sat = 96% PA sat = 53%, 55% LVEF 10% markedly dilated   Assessment: 1. Minimal CAD 2. Severe NICM EF 10% 3. Mildly elevated filling pressures with severely reduced CO by Thermo   Plan/Discussion:   Will plan TEE/DC-CV and PVC suppression. Begin w/u for LVAD if EF not improved.    Arvilla Meres, MD  3:13 PM     Indications  Acute on chronic systolic heart failure (HCC) [I50.23 (ICD-10-CM)]   Procedural Details  Technical Details The risks and indication of the procedure were explained. Consent was signed and placed on the chart. An appropriate timeout was taken prior to the procedure.   The left AC fossa was prepped and draped in the routine sterile fashion and anesthetized with 1% local lidocaine. The pre-existing PIV in the right [employer] was exchanged over a wire for a 5 FR venous sheath using a modified Seldinger technique. We were able to pass the wire by the PPM in the left subclavian but could not pass the catheter. We then switched to the RIJ and performed the RHC from that approach. We used u/s guidance for access and started with a 5FR  set up but then upsized to 7FR to get thermodilution.  After a normal Allen's test was confirmed, the right wrist was prepped and draped in the routine sterile fashion and anesthetized with 1% local lidocaine. A 5 FR arterial sheath was then placed in the right ulnar artery using a modified Seldinger technique and u/s guidance. 3mg  IV verapamil was given through the sheath. Systemic heparin was administered.  Standard catheters including a JL 3.5 and a JR 4 were used. All catheter exchanges were made over a wire.   Estimated blood loss <50 mL.   During this procedure medications were administered to achieve and maintain moderate conscious sedation while the patient's heart rate, blood pressure, and oxygen saturation were continuously monitored and I was present face-to-face 100% of this time.   Medications (Filter:  Administrations occurring from 1333 to 1510 on 08/10/22)  important  Continuous medications are totaled by the amount administered until 08/10/22 1510.   fentaNYL (SUBLIMAZE) injection (mcg)  Total dose: 25 mcg Date/Time Rate/Dose/Volume Action   08/10/22 1406 25 mcg Given   midazolam (VERSED) injection (mg)  Total dose: 1 mg Date/Time Rate/Dose/Volume Action   08/10/22 1406 1 mg Given   lidocaine (PF) (XYLOCAINE) 1 % injection (mL)  Total volume: 9 mL Date/Time Rate/Dose/Volume Action   08/10/22 1414 4 mL Given   1439 5 mL Given   Radial Cocktail/Verapamil only (mL)  Total volume: 10 mL Date/Time Rate/Dose/Volume Action   08/10/22 1414 10 mL Given   Heparin (Porcine) in NaCl 1000-0.9 UT/500ML-% SOLN (mL)  Total volume: 1,000 mL Date/Time Rate/Dose/Volume Action   08/10/22 1414 500 mL Given   1414 500 mL Given   heparin sodium (porcine) injection (Units)  Total dose: 4,500 Units Date/Time Rate/Dose/Volume Action   08/10/22 1430 4,500 Units Given   iohexol (OMNIPAQUE) 350 MG/ML injection (mL)  Total volume: 40 mL Date/Time Rate/Dose/Volume Action   08/10/22 1502 40 mL Given   albuterol (PROVENTIL) (2.5 MG/3ML) 0.083% nebulizer solution 3 mL (mL)  Total dose: Cannot be calculated* Dosing weight: 100.7 *Administration dose not documented Date/Time Rate/Dose/Volume Action   08/10/22 1333 *Not included in total MAR Hold   Chlorhexidine Gluconate Cloth 2 % PADS 6 each (each)  Total dose: Cannot be calculated* Dosing weight: 94.4 *Administration dose not documented Date/Time Rate/Dose/Volume Action   08/10/22 1333 *Not included in total MAR Hold   cyanocobalamin (VITAMIN B12) tablet 1,000 mcg (mcg)  Total dose: Cannot be calculated* Dosing weight: 93.2 *Administration dose not documented Date/Time Rate/Dose/Volume Action   08/10/22 1333 *Not included in total MAR Hold   dicyclomine (BENTYL) capsule 10 mg (mg)  Total dose: Cannot be calculated* Dosing weight:  94.4 *Administration dose not documented Date/Time Rate/Dose/Volume Action   08/10/22 1333 *Not included in total MAR Hold   digoxin (LANOXIN) tablet 0.125 mg (mg)  Total dose: Cannot be calculated* Dosing weight: 94.4 *Administration dose not documented Date/Time Rate/Dose/Volume Action   08/10/22 1333 *Not included in total MAR Hold   empagliflozin (JARDIANCE) tablet 10 mg (mg)  Total dose: Cannot be calculated* Dosing weight: 94.4 *Administration dose not documented Date/Time Rate/Dose/Volume Action   08/10/22 1333 *Not included in total MAR Hold   finasteride (PROSCAR) tablet 5 mg (mg)  Total dose: Cannot be calculated* Dosing weight: 100.7 *Administration dose not documented Date/Time Rate/Dose/Volume Action   08/10/22 1333 *Not included in total MAR Hold   melatonin tablet 3 mg (mg)  Total dose: Cannot be calculated* Dosing weight: 100.7 *Administration dose not documented Date/Time Rate/Dose/Volume Action  08/10/22 1333 *Not included in total MAR Hold   pantoprazole (PROTONIX) EC tablet 40 mg (mg)  Total dose: Cannot be calculated* Dosing weight: 100.7 *Administration dose not documented Date/Time Rate/Dose/Volume Action   08/10/22 1333 *Not included in total MAR Hold   polyethylene glycol (MIRALAX / GLYCOLAX) packet 17 g (g)  Total dose: Cannot be calculated* Dosing weight: 94.4 *Administration dose not documented Date/Time Rate/Dose/Volume Action   08/10/22 1333 *Not included in total MAR Hold   pravastatin (PRAVACHOL) tablet 40 mg (mg)  Total dose: Cannot be calculated* Dosing weight: 100.7 *Administration dose not documented Date/Time Rate/Dose/Volume Action   08/10/22 1333 *Not included in total MAR Hold   tamsulosin (FLOMAX) capsule 0.4 mg (mg)  Total dose: Cannot be calculated* Dosing weight: 100.7 *Administration dose not documented Date/Time Rate/Dose/Volume Action   08/10/22 1333 *Not included in total MAR Hold   furosemide (LASIX) injection 80 mg (mg)   Total dose: Cannot be calculated* Dosing weight: 95.8 *Administration dose not documented Date/Time Rate/Dose/Volume Action   08/10/22 1333 *Not included in total MAR Hold   sodium chloride flush (NS) 0.9 % injection 10-40 mL (mL)  Total dose: Cannot be calculated* Dosing weight: 94.4 *Administration dose not documented Date/Time Rate/Dose/Volume Action   08/10/22 1333 *Not included in total MAR Hold   sodium chloride flush (NS) 0.9 % injection 10-40 mL (mL)  Total dose: Cannot be calculated* Dosing weight: 94.4 *Administration dose not documented Date/Time Rate/Dose/Volume Action   08/10/22 1333 *Not included in total MAR Hold   sodium chloride flush (NS) 0.9 % injection 3 mL (mL)  Total dose: Cannot be calculated* Dosing weight: 95.8 *Administration dose not documented Date/Time Rate/Dose/Volume Action   08/10/22 1333 *Not included in total MAR Hold    Sedation Time  Sedation Time Physician-1: 50 minutes 36 seconds Contrast     Administrations occurring from 1333 to 1510 on 08/10/22:  Medication Name Total Dose  iohexol (OMNIPAQUE) 350 MG/ML injection 40 mL   Radiation/Fluoro  Fluoro time: 9.5 (min) DAP: 17.1 (Gycm2) Cumulative Air Kerma: 254.8 (mGy) Complications  Complications documented before study signed (08/10/2022  3:13 PM)   No complications were associated with this study.  Documented by Avel Sensor, RN - 08/10/2022  1:37 PM     Coronary Findings  Diagnostic Dominance: Right Left Main  Vessel is angiographically normal.    Left Anterior Descending  Prox LAD to Mid LAD lesion is 20% stenosed.    Left Circumflex  Vessel is angiographically normal.    Right Coronary Artery  Vessel is angiographically normal.    Intervention   No interventions have been documented.   Left Heart  Left Ventricle The left ventricular ejection fraction is less than 25% by visual estimate.   Coronary Diagrams  Diagnostic Dominance: Right  Intervention    Implants   No implant documentation for this case.   Syngo Images   Show images for CARDIAC CATHETERIZATION Images on Long Term Storage   Show images for Abenezer, Huizar to Procedure Log  Procedure Log    Hemo Data  Flowsheet Row Most Recent Value  Fick Cardiac Output 6.07 L/min  Fick Cardiac Output Index 2.68 (L/min)/BSA  Thermal Cardiac Output 4.29 L/min  Thermal Cardiac Output Index 1.89 (L/min)/BSA  RA A Wave 10 mmHg  RA V Wave 10 mmHg  RA Mean 9 mmHg  RV Systolic Pressure 37 mmHg  RV Diastolic Pressure 3 mmHg  RV EDP 11 mmHg  PA Systolic Pressure 34 mmHg  PA  Diastolic Pressure 19 mmHg  PA Mean 27 mmHg  PW A Wave 24 mmHg  PW V Wave 29 mmHg  PW Mean 20 mmHg  AO Systolic Pressure 85 mmHg  AO Diastolic Pressure 61 mmHg  AO Mean 73 mmHg  LV Systolic Pressure 84 mmHg  LV Diastolic Pressure 2 mmHg  LV EDP 17 mmHg  AOp Systolic Pressure 84 mmHg  AOp Diastolic Pressure 62 mmHg  AOp Mean Pressure 72 mmHg  LVp Systolic Pressure 82 mmHg  LVp Diastolic Pressure 11 mmHg  LVp EDP Pressure 17 mmHg  QP/QS 1  TPVR Index 10.09 HRUI  TSVR Index 27.28 HRUI  PVR SVR Ratio 0.11  TPVR/TSVR Ratio 0.37   Impression:  This 67 year old gentleman has nonischemic cardiomyopathy with ejection fraction of less than 20% and presented with acute on chronic heart failure.  He has had multiple admissions over the past 6 months for congestive heart failure.  His left ventricle is 8 cm on echocardiogram.  Advanced heart failure feels that it is possible that his cardiomyopathy could be related to a high PVC burden and he has been started on amiodarone to maintain sinus rhythm and suppress PVCs.  He is doing fairly well clinically at this time and it is worth following up on his ejection fraction in about a month to see if there has been significant improvement.  If there is no significant improvement that I think LVAD therapy will most likely be necessary to prevent worsening multisystem  organ failure.  He already has stage IIIa chronic kidney disease with a creatinine of 1.5-1.6.  I think he is a reasonable candidate for LVAD therapy and seems medically compliant with a good understanding of his underlying health problems.  I discussed LVAD implant surgery with the patient and his sister and answered their questions.  Plan:  He was discharged today and will follow-up in the heart failure clinic.  We will discuss him at [facility] and continue to follow him closely.   Alleen Borne, MD 08/12/2022 4:28 PM

## 2022-08-12 NOTE — Progress Notes (Signed)
Patient provided discharge education. Patient stated that he has all belongings. Patient driving self home. MD aware.

## 2022-08-12 NOTE — TOC Transition Note (Signed)
Transition of Care [facility]) - CM/SW Discharge Note   Patient Details  Name: Austin [name] MRN: [id number] Date of Birth: 1956/01/25  [facility] CM/SW Contact:  Reva Bores, LCSWA Phone Number: 08/12/2022, 1:02 PM   Clinical Narrative:  CSW met with patient and his sister at pt bedside. Pt sister is returning home today. Pt stated that he has a scale at home. CSW spoke with PCP provider office, they will contact him for follow-up appt. Pt has his appt. Scheduled with HF in [facility] clinic.     Final next level of care: Home/Self Care Barriers to Discharge: No Barriers Identified   Patient Goals and CMS Choice      Discharge Placement                         Discharge Plan and Services Additional resources added to the After Visit Summary for                                       Social Determinants of Health (SDOH) Interventions SDOH Screenings   Food Insecurity: No Food Insecurity (08/07/2022)  Housing: Low Risk  (08/07/2022)  Transportation Needs: No Transportation Needs (08/07/2022)  Utilities: Not At Risk (08/07/2022)  Tobacco Use: Medium Risk (08/11/2022)     Readmission Risk Interventions     No data to display

## 2022-08-12 NOTE — Assessment & Plan Note (Signed)
Labs consistent with iron deficiency anemia. Hgb stable 9.4. -AM CBC -Replete Iron, B12 -GI outpatient -S/p IV iron x2 -Transfuse Hgb <8

## 2022-08-12 NOTE — Assessment & Plan Note (Signed)
-   A1c 6.2%

## 2022-08-12 NOTE — Care Management Important Message (Signed)
Important Message  Patient Details  Name: Austin [name] MRN: [id number] Date of Birth: 07-Sep-1955   Medicare Important Message Given:  Yes     Dorena Bodo 08/12/2022, 1:46 PM

## 2022-08-12 NOTE — Assessment & Plan Note (Signed)
Cr increased to 1.62. CKD Stage 3. Per Care Everywhere, baseline 1-1.20. -Decrease diuresis per heart failure team

## 2022-08-12 NOTE — Assessment & Plan Note (Signed)
Heart rate controlled 80-90s on exam today.  PVCs decreased with amio. Currently in sinus rhythm. -Stop Coreg -Continue Eliquis -Cardioversion this week -Amio gtt

## 2022-08-12 NOTE — Plan of Care (Signed)
  Problem: Education: Goal: Knowledge of General Education information will improve Description: Including pain rating scale, medication(s)/side effects and non-pharmacologic comfort measures Outcome: Adequate for Discharge   Problem: Health Behavior/Discharge Planning: Goal: Ability to manage health-related needs will improve Outcome: Adequate for Discharge   Problem: Clinical Measurements: Goal: Ability to maintain clinical measurements within normal limits will improve Outcome: Adequate for Discharge Goal: Will remain free from infection Outcome: Adequate for Discharge Goal: Diagnostic test results will improve Outcome: Adequate for Discharge Goal: Respiratory complications will improve Outcome: Adequate for Discharge Goal: Cardiovascular complication will be avoided Outcome: Adequate for Discharge   Problem: Activity: Goal: Risk for activity intolerance will decrease Outcome: Adequate for Discharge   Problem: Nutrition: Goal: Adequate nutrition will be maintained Outcome: Adequate for Discharge   Problem: Coping: Goal: Level of anxiety will decrease 08/12/2022 1408 by Garth Bigness, RN Outcome: Adequate for Discharge 08/12/2022 1057 by Garth Bigness, RN Outcome: Progressing   Problem: Elimination: Goal: Will not experience complications related to bowel motility 08/12/2022 1408 by Garth Bigness, RN Outcome: Adequate for Discharge 08/12/2022 1057 by Garth Bigness, RN Outcome: Progressing Goal: Will not experience complications related to urinary retention 08/12/2022 1408 by Garth Bigness, RN Outcome: Adequate for Discharge 08/12/2022 1057 by Garth Bigness, RN Outcome: Progressing   Problem: Pain Managment: Goal: General experience of comfort will improve 08/12/2022 1408 by Garth Bigness, RN Outcome: Adequate for Discharge 08/12/2022 1057 by Garth Bigness, RN Outcome: Progressing   Problem: Safety: Goal: Ability to remain free from  injury will improve Outcome: Adequate for Discharge   Problem: Skin Integrity: Goal: Risk for impaired skin integrity will decrease Outcome: Adequate for Discharge   Problem: Education: Goal: Understanding of CV disease, CV risk reduction, and recovery process will improve Outcome: Adequate for Discharge Goal: Individualized Educational Video(s) Outcome: Adequate for Discharge   Problem: Activity: Goal: Ability to return to baseline activity level will improve Outcome: Adequate for Discharge   Problem: Cardiovascular: Goal: Ability to achieve and maintain adequate cardiovascular perfusion will improve Outcome: Adequate for Discharge Goal: Vascular access site(s) Level 0-1 will be maintained Outcome: Adequate for Discharge   Problem: Health Behavior/Discharge Planning: Goal: Ability to safely manage health-related needs after discharge will improve Outcome: Adequate for Discharge

## 2022-08-12 NOTE — Progress Notes (Signed)
SATURATION QUALIFICATIONS: (This note is used to comply with regulatory documentation for home oxygen)  Patient Saturations on Room Air at Rest = 96%  Patient Saturations on Room Air while Ambulating = 92%  Patient Saturations on 1 Liters of oxygen while Ambulating = 97%  Please briefly explain why patient needs home oxygen:

## 2022-08-13 ENCOUNTER — Encounter: Payer: Self-pay | Admitting: Family Medicine

## 2022-08-13 LAB — CULTURE, BLOOD (ROUTINE X 2)

## 2022-08-13 LAB — PSA, TOTAL AND FREE
PSA, Free Pct: 18.2 %
PSA, Free: 0.4 ng/mL
Prostate Specific Ag, Serum: 2.2 ng/mL (ref 0.0–4.0)

## 2022-08-13 LAB — HEPATITIS B SURFACE ANTIBODY, QUANTITATIVE: Hep B S AB Quant (Post): <3.5 m[IU]/mL — ABNORMAL LOW (ref >9.9)

## 2022-08-14 LAB — CULTURE, BLOOD (ROUTINE X 2): Culture: NO GROWTH

## 2022-08-15 ENCOUNTER — Encounter (HOSPITAL_COMMUNITY): Payer: Medicare PPO

## 2022-08-15 LAB — LUPUS ANTICOAGULANT PANEL
DRVVT: 62 s — ABNORMAL HIGH (ref 0.0–47.0)
PTT Lupus Anticoagulant: 56.5 s — ABNORMAL HIGH (ref 0.0–43.5)

## 2022-08-15 LAB — HEXAGONAL PHASE PHOSPHOLIPID: Hexagonal Phase Phospholipid: 5 s (ref 0–11)

## 2022-08-15 LAB — DRVVT MIX: dRVVT Mix: 49 s — ABNORMAL HIGH (ref 0.0–40.4)

## 2022-08-15 LAB — DRVVT CONFIRM: dRVVT Confirm: 0.7 ratio — ABNORMAL LOW (ref 0.8–1.2)

## 2022-08-15 LAB — PTT-LA MIX: PTT-LA Mix: 50.7 s — ABNORMAL HIGH (ref 0.0–40.5)

## 2022-08-19 NOTE — Progress Notes (Signed)
LVAD Initial Psychosocial Screening  Date/Time Initiated:  08-11-22 3pm Referral Source:  Simmie Davies, VAD Coordinator Referral Reason:  LVAD Implantation Source of Information:  Pt and chart review  Demographics Name:  Austin [name] Address:  [address] [place] [address] Cell: [phone] Marital Status:  Single  Faith:  [employer] Primary Language:  English DOB:  1956/02/05  Medical & Follow-up Adherence to Medical regimen/INR checks: compliant  Medication adherence: compliant  Physician/Clinic Appointment Attendance: compliant Comments: Patient denies any concerns   Advance Directives: Do you have a Living Will or Medical POA? Yes sister Austin [name] HPOA Would you like to complete a Living Will and Medical POA prior to surgery?  No Do you have Goals of Care? Yes  Have you had a consult with the Palliative Care Team at [facility]? No  Psychological Health Appearance:  In hospital gown Mental Status:  Alert, oriented Eye Contact:  Good Thought Content:  Coherent Speech:  Logical/coherent Mood:  Appropriate and Pleasant  Affect:  Appropriate to circumstance Insight:  Good Judgement: Unimpaired Interaction Style:  Engaged  Family/Social Information Who lives in your home? Name:   Relationship:   Lives alone (has a cat) Other family members/support persons in your life? Name:   Relationship:   Austin [name]  Sister lives in [place], [place] Austin [name] Friend lives in [place]  Caregiving Needs (Patient unsure of Caregiver plan at this time) Who is the primary caregiver? Health status:   Do you drive?   Do you work?   Physical Limitations:   Do you have other care giving responsibilities?   Contact number:  Who is the secondary caregiver? Health status:   Do you drive?   Do you work?   Physical Limitations:   Do you have other care giving responsibilities?   Contact number:  Home Environment/Personal Care Do you have  reliable phone service? Yes  Do you own or rent your home? rent Number of steps into the home? 1 step How many levels in the home? Ground level Assistive devices in the home? no Electrical needs for LVAD (3 prong outlets)? yes Second hand smoke exposure in the home? no Travel distance from [facility]? 45 minutes   Community Are you active with community agencies/resources/homecare? No Agency Name:  Are you active in a church, synagogue, mosque or other faith based community? No Faith based institutions name:  What other sources do you have for spiritual support? none Are you active in any clubs or social organizations? [employer] What do you do for fun?  Hobbies?  Interests? He states he enjoys being with other Veterans, [employer] racing and loves to travel.  Education/Work Information What is the last grade of school you completed?  12th GED Preferred method of learning?  Hands on Do you have any problems with reading or writing?  No Are you currently employed?  Yes  Name of employer? 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 If you are not working, do you plan to return to work after VAD surgery? Pending Are you interested in job training or learning new skills?  No Did you serve in the military? Yes  If so, what branch? [employer] 4 years  Financial Information What is your source of income? SSA retirement and Part time employment Do you have difficulty meeting your monthly  expenses? No Can you budget for the monthly cost for dressing supplies post procedure? Yes  Primary Health insurance:  Humana Gold Secondary Insurance: Prescription plan: High costs and shares concern that he has no prescription benefit Pharmacy:   Do you use mail order for your prescriptions?  No Have you ever had to refuse medication due to cost?  Yes Have you applied for Medicaid?   no Have you applied for Social Security Disability (SSI)  no  Medical Information Briefly describe why you are here for evaluation: HF started in 2012 and continue to have difficulty breathing Do you have a PCP or other medical provider? [place], [place] Scrape, NP Are you able to complete your ADL's? yes Do you have a history of trauma, physical, emotional, or sexual abuse? no Do you have any family history of heart problems? no Do you smoke now or past usage? past usage    Quit date: years ago Do you drink alcohol now or past usage? Yes    socially maybe 3-4 beers a year Are you currently using illegal drugs or misuse of medication or past usage? never  Have you ever been treated for substance abuse? No     Mental Health History How have you been feeling in the past year? Good days and Bad days Have you ever had any problems with depression, anxiety or other mental health issues? no Do you see a counselor, psychiatrist or therapist?  no If you are currently experiencing problems are you interested in talking with a professional? No Have you or are you taking medications for anxiety/depression or any mental health concerns?  No  What are your coping strategies under stressful situations? Do what will help me through Are there any other stressors in your life? none Have you had any past or current thoughts of suicide? no How many hours do you sleep at night? 8 hours How is your appetite? Usually good Would you be interested in attending the LVAD support group? yes  PHQ2 Depression Scale: 0   Legal Do you currently have any legal issues/problems?  no Have you had any legal issues/problems in the past?  no   Plan for VAD Implementation Do you know and understand what happens during the VAD surgery? Patient Verbalizes Understanding  of surgery and able to describe details What do you know about the risks and side effect associated with VAD surgery? Patient Verbalizes Understanding  of  risks (infection, stroke and death) Explain what will happen right after surgery: Patient Verbalizes Understanding  of OR to ICU and will be intubated What is your plan for transportation for the first 8 weeks post-surgery? (Patients are not recommended to drive post-surgery for 8 weeks)  Driver:    Friends Do you have airbags in your vehicle?  There is a risk of discharging the device if the airbag were to deploy. What do you know about your diet post-surgery? Patient Verbalizes Understanding  of Heart healthy How do you plan to monitor your medications, current and future?   Pill bottles How do you plan to complete ADL's post-surgery?  Will ask for help if needed Will it be difficult to ask for help from your caregivers?  No  Please explain what you hope will be improved about your life as a result of receiving the LVAD?Keep me out of the hospital and breathing better Please tell me your biggest concern or fear about living with the LVAD?   Surgery could go bad/If I died Please explain  your understanding of how their body will change? A little in the beginning but once I get use to it then it won't bother me.  Are you worried about these changes? no Do you see any barriers to your surgery or follow-up? Denies any concerns/barriers  Understanding of LVAD Patient states understanding of the following: Surgical procedures and risks, Electrical need for LVAD (3 prong outlets), Safety precautions with LVAD (water, etc.), LVAD daily self-care (dressing changes, computer check, extra supplies), Outpatient follow up (LVAD clinic appts, monitoring blood thinners), and Need for Emergency Planning  Discussed and Reviewed with Patient  Patient's current level of motivation to prepare for LVAD: Motivated Patient's present Level of Consent for LVAD: Hoping other therapies and medications will improve his health but willing if needed      Education provided to patient:   Caregiver role and  responsibiltiy, Financial planning for LVAD, Role of Clinical Social Worker, and Signs of Depression and Anxiety  Comments:  Patient is unclear about his caregiver at this time. He will discuss further with sister and friend and develop a plan as needed.  Clinical Interventions Needed:    CSW will monitor signs and symptoms of depression and assist with adjustment to life with an LVAD.  CSW will explore Medicare prescription benefit. CSW will follow up with patient to discuss further a plan for role of caregiver. CSW encouraged attendance with the LVAD Support Group to assist further with adjustment and post implant peer support.  Clinical Impressions/Recommendations:   Mr. Lister is a 67 yo single male who lives in [place]. He reports he has a sister in [facility] and a friend who is local. 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 He enjoys his activities with the [employer], NASCAR racing and loves to travel. He has his GED and prefers hands on learning. He was in the [employer] for 4 years and enjoys spending time with the local veterans. He denies any financial concerns and has his SSA retirement and part time employment. He has [employer] although reports he is unsure if he has a prescription benefit as he has some high costs for medications. He denies any concerns with substance use, tobacco use or alcohol. He scored a 0 on the [facility] and denies any concerns with mental health. He states he will do what will help him to get through stressful situations. He hopes to stay out of the hospital and breathe better as his goal for getting the LVAD. Patient is unsure of his caregiver plan at this time and will discuss further with family and friends to develop a plan. Patient appears to be a good candidate for LVAD implantation pending a caregiver plan. CSW will continue to follow as needed.    Shane Crutch, CCSW-MCS  [phone]

## 2022-08-22 ENCOUNTER — Ambulatory Visit (HOSPITAL_COMMUNITY)
Admit: 2022-08-22 | Discharge: 2022-08-22 | Disposition: A | Discharge status: Home / Self Care | Payer: Medicare PPO | Attending: Family Medicine | Admitting: Family Medicine

## 2022-08-23 ENCOUNTER — Encounter (HOSPITAL_COMMUNITY): Payer: Self-pay

## 2022-08-23 ENCOUNTER — Other Ambulatory Visit (HOSPITAL_COMMUNITY): Payer: Self-pay

## 2022-08-23 ENCOUNTER — Ambulatory Visit (HOSPITAL_COMMUNITY)
Admission: RE | Admit: 2022-08-23 | Discharge: 2022-08-23 | Disposition: A | Discharge status: Home / Self Care | Payer: Medicare PPO | Source: Ambulatory Visit | Attending: Family Medicine | Admitting: Family Medicine

## 2022-08-23 VITALS — BP 122/70 | HR 62 | Wt 218.4 lb

## 2022-08-23 DIAGNOSIS — I5022 Chronic systolic (congestive) heart failure: Secondary | ICD-10-CM | POA: Diagnosis not present

## 2022-08-23 DIAGNOSIS — D631 Anemia in chronic kidney disease: Secondary | ICD-10-CM | POA: Insufficient documentation

## 2022-08-23 DIAGNOSIS — I130 Hypertensive heart and chronic kidney disease with heart failure and stage 1 through stage 4 chronic kidney disease, or unspecified chronic kidney disease: Secondary | ICD-10-CM | POA: Insufficient documentation

## 2022-08-23 DIAGNOSIS — I4891 Unspecified atrial fibrillation: Secondary | ICD-10-CM

## 2022-08-23 DIAGNOSIS — R42 Dizziness and giddiness: Secondary | ICD-10-CM | POA: Insufficient documentation

## 2022-08-23 DIAGNOSIS — Z79899 Other long term (current) drug therapy: Secondary | ICD-10-CM | POA: Diagnosis not present

## 2022-08-23 DIAGNOSIS — Z7901 Long term (current) use of anticoagulants: Secondary | ICD-10-CM | POA: Diagnosis not present

## 2022-08-23 DIAGNOSIS — N183 Chronic kidney disease, stage 3 unspecified: Secondary | ICD-10-CM | POA: Diagnosis not present

## 2022-08-23 DIAGNOSIS — R0602 Shortness of breath: Secondary | ICD-10-CM | POA: Insufficient documentation

## 2022-08-23 DIAGNOSIS — D509 Iron deficiency anemia, unspecified: Secondary | ICD-10-CM

## 2022-08-23 DIAGNOSIS — N1831 Chronic kidney disease, stage 3a: Secondary | ICD-10-CM | POA: Diagnosis not present

## 2022-08-23 DIAGNOSIS — I493 Ventricular premature depolarization: Secondary | ICD-10-CM | POA: Insufficient documentation

## 2022-08-23 DIAGNOSIS — I2510 Atherosclerotic heart disease of native coronary artery without angina pectoris: Secondary | ICD-10-CM | POA: Diagnosis not present

## 2022-08-23 DIAGNOSIS — I480 Paroxysmal atrial fibrillation: Secondary | ICD-10-CM | POA: Diagnosis not present

## 2022-08-23 DIAGNOSIS — I428 Other cardiomyopathies: Secondary | ICD-10-CM | POA: Diagnosis not present

## 2022-08-23 DIAGNOSIS — Z7984 Long term (current) use of oral hypoglycemic drugs: Secondary | ICD-10-CM | POA: Diagnosis not present

## 2022-08-23 DIAGNOSIS — I5020 Unspecified systolic (congestive) heart failure: Secondary | ICD-10-CM

## 2022-08-23 DIAGNOSIS — E785 Hyperlipidemia, unspecified: Secondary | ICD-10-CM | POA: Diagnosis not present

## 2022-08-23 LAB — CBC
HCT: 33.2 % — ABNORMAL LOW (ref 39.0–52.0)
Hemoglobin: 9.6 g/dL — ABNORMAL LOW (ref 13.0–17.0)
MCH: 23.9 pg — ABNORMAL LOW (ref 26.0–34.0)
MCHC: 28.9 g/dL — ABNORMAL LOW (ref 30.0–36.0)
MCV: 82.8 fL (ref 80.0–100.0)
Platelets: 232 10*3/uL (ref 150–400)
RBC: 4.01 MIL/uL — ABNORMAL LOW (ref 4.22–5.81)
RDW: 21.1 % — ABNORMAL HIGH (ref 11.5–15.5)
WBC: 6.6 10*3/uL (ref 4.0–10.5)
nRBC: 0 % (ref 0.0–0.2)

## 2022-08-23 LAB — BASIC METABOLIC PANEL
Anion gap: 7 (ref 5–15)
BUN: 14 mg/dL (ref 8–23)
CO2: 25 mmol/L (ref 22–32)
Calcium: 9.2 mg/dL (ref 8.9–10.3)
Chloride: 108 mmol/L (ref 98–111)
Creatinine, Ser: 1.25 mg/dL — ABNORMAL HIGH (ref 0.61–1.24)
GFR, Estimated: >60 mL/min (ref >60)
Glucose, Bld: 77 mg/dL (ref 70–99)
Potassium: 3.9 mmol/L (ref 3.5–5.1)
Sodium: 140 mmol/L (ref 135–145)

## 2022-08-23 LAB — DIGOXIN LEVEL: Digoxin Level: 0.2 ng/mL — ABNORMAL LOW (ref 0.8–2.0)

## 2022-08-23 LAB — BRAIN NATRIURETIC PEPTIDE: B Natriuretic Peptide: 723.2 pg/mL — ABNORMAL HIGH (ref 0.0–100.0)

## 2022-08-23 NOTE — Progress Notes (Signed)
ReDS Vest / Clip - 08/23/22 1500       ReDS Vest / Clip   Station Marker D    Ruler Value 34    ReDS Value Range Low volume    ReDS Actual Value 31    Anatomical Comments sitting

## 2022-08-23 NOTE — Progress Notes (Signed)
ADVANCED HF CLINIC CONSULT NOTE   Primary Care: [name], NP HF Cardiologist: Dr. [name]  Austin [name] is a 67 y.o. old with a history of chronic HFrEF, HTN, HLD, nonobstructive CAD 2012, CKD Stage III, PAF, and single chamber Biotronik .    EF initially down 2012 to 20%. EF improved in 2013 to 50-55%.    Cath (2015): LCX 20% OMI, otherwise ok.  Echo (2022): EF 25-30% RV normal.  Echo (03/2022): EF 15-20% RV normal.    Admitted to [facility] Jan, Feb, March, April, and May of 2024 with A/C HFrEF. Last noted to be in SR in March 2024.   He decided he wanted to come back to [facility] to re-establish with Dr [name] and Heart Failure Team. He has been having trouble paying for medications.    Admitted 6/24 with a/c HF and AF. Echo showed EF < 20% RV normal. Diuresed with IV lasix, given IV iron and started on IV amiodarone. R/LHC showed minimal CAD, severe NICM EF 10%, mildly elevated filling pressures and severely reduced CI 1.9. AF was rate controlled, with frequent PVCs. Chemically converted to NSR on amio. LVAD work up initiated. Drips weaned and GDMT titrated. He was discharged home, weight 204 lbs. Plan to repeat echo in 4-6 weeks after GDMT and PVC suppression, with eye toward LVAD if EF not improved.  Today he returns for HF follow up. Overall feeling fine. He has SOB walking in heat. No issues walking on flat ground or with ADLs. Occasional dizziness when is in the heat, walks outside with his cat early in the morning. Denies palpitations, CP, abnormal bleeding, edema, or PND/Orthopnea. Appetite ok. No fever or chills. Weight at home 218-220 pounds. Some confusion about his medications, has missed some doses of Eliquis. Lives alone and is able to drive to appoints. Works part time at [employer].  Has family in [place].    Cardiac Studies  - R/LHC (6/24): minimal CAD, severe NICM EF 10% RA 9, PA 34/19 ( 27), PCWP 20 (v waves to 30), CO/CI (thermo) 4.3/1.9, PVR  1.6   - Echo (6/24): EF < 20%, echodensity on device in RV, RV moderately down, mild MR, ascending aorta 40 mm  - Echo (2/24): EF 15-20% RV normal.   - Echo (2022): EF 25-30% RV normal.   - Cath (2015): LCX 20% OMI, otherwise ok.   - Echo (2013): EF 50-55%  - Echo (2012): EF 20%  Review of Systems: [y] = yes, [ ]  = no   General: Weight gain [ ] ; Weight loss [ ] ; Anorexia [ ] ; Fatigue [ ] ; Fever [ ] ; Chills [ ] ; Weakness [ ]   Cardiac: Chest pain/pressure [ ] ; Resting SOB [ ] ; Exertional SOB [ ] ; Orthopnea [ ] ; Pedal Edema [ ] ; Palpitations [ ] ; Syncope [ ] ; Presyncope [ ] ; Paroxysmal nocturnal dyspnea[ ]   Pulmonary: Cough [ ] ; Wheezing[ ] ; Hemoptysis[ ] ; Sputum [ ] ; Snoring [ ]   GI: Vomiting[ ] ; Dysphagia[ ] ; Melena[ ] ; Hematochezia [ ] ; Heartburn[ ] ; Abdominal pain [ ] ; Constipation [ ] ; Diarrhea [ ] ; BRBPR [ ]   GU: Hematuria[ ] ; Dysuria [ ] ; Nocturia[ ]   Vascular: Pain in legs with walking [ ] ; Pain in feet with lying flat [ ] ; Non-healing sores [ ] ; Stroke [ ] ; TIA [ ] ; Slurred speech [ ] ;  Neuro: Headaches[ ] ; Vertigo[ ] ; Seizures[ ] ; Paresthesias[ ] ;Blurred vision [ ] ; Diplopia [ ] ; Vision changes [ ]   Ortho/Skin: Arthritis [ ] ;  Joint pain [ ] ; Muscle pain [ ] ; Joint swelling [ ] ; Back Pain [ ] ; Rash [ ]   Psych: Depression[ ] ; Anxiety[ ]   Heme: Bleeding problems [ ] ; Clotting disorders [ ] ; Anemia [ y]  Endocrine: Diabetes [ ] ; Thyroid dysfunction[ ]   Past Medical History:  Diagnosis Date   Benign prostatic hypertrophy    Hyperlipidemia    Nonischemic cardiomyopathy [facility]) Oct 2012   cath 12/10/10 minimal nonobstructive coronary artery disease   Systolic heart failure Oct 2012   echo 12/08/10 EF 20%, severe left ventricular enlargement, mild right ventricular enlargement    Current Outpatient Medications  Medication Sig Dispense Refill   acetaminophen (TYLENOL) 325 MG tablet Take 2 tablets (650 mg total) by mouth every 4 (four) hours as needed for headache or mild pain.      albuterol (VENTOLIN HFA) 108 (90 Base) MCG/ACT inhaler Inhale 1-2 puffs into the lungs every 6 (six) hours as needed.     amiodarone (PACERONE) 200 MG tablet Take 1 tablet (200 mg total) by mouth 2 (two) times daily. 60 tablet 0   apixaban (ELIQUIS) 5 MG TABS tablet Take 5 mg by mouth 2 (two) times daily.     Cholecalciferol 50 MCG (2000 UT) TABS Take 1 tablet by mouth daily.     digoxin (LANOXIN) 0.125 MG tablet Take 1 tablet (0.125 mg total) by mouth daily. 30 tablet 0   fenofibrate micronized (LOFIBRA) 67 MG capsule Take 67 mg by mouth every morning.     ferrous sulfate 325 (65 FE) MG tablet Take 325 mg by mouth.     finasteride (PROSCAR) 5 MG tablet Take 1 tablet by mouth daily.     hydrocortisone 2.5 % cream Apply 1 Application topically as needed.     JARDIANCE 10 MG TABS tablet Take 10 mg by mouth daily.     ketoconazole (NIZORAL) 2 % shampoo Apply 1 Application topically as needed.     loratadine (CLARITIN) 10 MG tablet Take 10 mg by mouth as needed.     losartan (COZAAR) 25 MG tablet Take 0.5 tablets (12.5 mg total) by mouth daily. 15 tablet 0   melatonin 3 MG TABS tablet Take 3 mg by mouth at bedtime.     omeprazole (PRILOSEC) 20 MG capsule Take 20 mg by mouth daily.     pravastatin (PRAVACHOL) 40 MG tablet Take 1 tablet (40 mg total) by mouth daily. 90 tablet 2   spironolactone (ALDACTONE) 25 MG tablet Take 0.5 tablets (12.5 mg total) by mouth daily. 15 tablet 0   Tamsulosin HCl (FLOMAX) 0.4 MG CAPS Take 0.4 mg by mouth daily after supper.     torsemide (DEMADEX) 20 MG tablet Take 2 tablets (40 mg total) by mouth daily. 60 tablet 0   No current facility-administered medications for this encounter.   Allergies  Allergen Reactions   Lisinopril Cough   Social History   Socioeconomic History   Marital status: Single    Spouse name: Not on file   Number of children: Not on file   Years of education: Not on file   Highest education level: Not on file  Occupational History    Not on file  Tobacco Use   Smoking status: Former    Packs/day: 2.00    Years: 14.00    Additional pack years: 0.00    Total pack years: 28.00    Types: Cigarettes    Quit date: 02/14/1993    Years since quitting: 29.5   Smokeless tobacco:  Not on file  Substance and Sexual Activity   Alcohol use: Yes    Comment: occasionally, once a month   Drug use: Not on file   Sexual activity: Not on file  Other Topics Concern   Not on file  Social History Narrative   He is single and lives in [place].  He drives a refrigerator truck.   Social Determinants of Health   Financial Resource Strain: Not on file  Food Insecurity: No Food Insecurity (08/07/2022)   Hunger Vital Sign    Worried About Running Out of Food in the Last Year: Never true    Ran Out of Food in the Last Year: Never true  Transportation Needs: No Transportation Needs (08/07/2022)   PRAPARE - Administrator, Civil Service (Medical): No    Lack of Transportation (Non-Medical): No  Physical Activity: Not on file  Stress: Not on file  Social Connections: Not on file  Intimate Partner Violence: Not At Risk (08/07/2022)   Humiliation, Afraid, Rape, and Kick questionnaire    Fear of Current or Ex-Partner: No    Emotionally Abused: No    Physically Abused: No    Sexually Abused: No    Family History  Problem Relation Age of Onset   Breast cancer Mother    Cancer Father    Heart attack Brother    Heart attack Brother 42   BP 122/70   Pulse 62   Wt 99.1 kg (218 lb 6.4 oz)   SpO2 97%   BMI 26.58 kg/m   Wt Readings from Last 3 Encounters:  08/23/22 99.1 kg (218 lb 6.4 oz)  08/12/22 93.1 kg (205 lb 4 oz)  03/28/12 112.7 kg (248 lb 8 oz)   PHYSICAL EXAM: General:  NAD. No resp difficulty, walked into clinic HEENT: Normal Neck: Supple. No JVD. Carotids 2+ bilat; no bruits. No lymphadenopathy or thryomegaly appreciated. Cor: PMI nondisplaced. Irregular rate & rhythm. No rubs, gallops or murmurs. Lungs:  Clear Abdomen: Soft, nontender, nondistended. No hepatosplenomegaly. No bruits or masses. Good bowel sounds. Extremities: No cyanosis, clubbing, rash, edema Neuro: Alert & oriented x 3, cranial nerves grossly intact. Moves all 4 extremities w/o difficulty. Affect pleasant.  ECG (personally reviewed): AF with PVCs, HR 71  ReDs: 31%  ASSESSMENT & PLAN: 1. Chronic Systolic Heart Failure - NICM  - Initially diagnosed with HFrEF in 2012. EF previously improved to 50-55% on medications.  - He had been lost to follow up in the HF clinic at [facility] and had been followed at [facility]. Most recent - cath 2015 showed nonobstructive LCX OM 20%.   - Echo (2022): EF went back down 25-30% and has continued to drop.  - Echo (6/24): EF now < 20%. He does have single chamber Biotronik.  - LHC this admit w/ minimal CAD - Possible cardiomyopathy from high PVC burden.  - RHC c/w severe NICM w/ mildly elevated filling pressures and severely reduced CO/CI by thermo 4.3/1.9.  - Will need eventual cMRI, had iron infusion during admit. - Improved NYHA I-II today, volume OK but weight is up 12 lbs, REDs 31%.  - Continue torsemide 40 mg daily. - Continue Jardiance 10 mg daily. - Continue digoxin 0.125 mg daily - Continue spironolactone 12.5 mg daily  - Add Entresto next. Continue to try to suppress PVCs and maintain NSR. If EF not improving w/ in 4-6 weeks w/ suppression, will need to consider LVAD.  - Labs today.   2. A fib  -  In NSR 04/2022. - Back in AF 5/24 during an admission - Started on amiodarone during most recent admission, chemically converted to NSR - AF with PVCs on ECG today, rate controlled. - Continue amiodarone 200 mg bid. - Continue Eliquis 5 mg bid, has missed some doses. Discussed importance of med compliance. - Arrange TEE/DC-CV with Dr. [name] this week. Suspect he will not tolerate AF for long, discussed with Dr. [name] - CBC today.  Informed Consent   Shared Decision  Making/Informed Consent2 The risks [stroke, cardiac arrhythmias rarely resulting in the need for a temporary or permanent pacemaker, skin irritation or burns, esophageal damage, perforation (1:10,000 risk), bleeding, pharyngeal hematoma as well as other potential complications associated with conscious sedation including aspiration, arrhythmia, respiratory failure and death], benefits (treatment guidance, restoration of normal sinus rhythm, diagnostic support) and alternatives of a transesophageal echocardiogram guided cardioversion were discussed in detail with Mr. Ninnemann and he is willing to proceed. - consider sleep study down the road - Consider eventual EP referral for ablation    3. CKD Stage IIIa - Baseline SCr 1 - Continue SGLT2i - Labs today   4. Anemia, IDA - Given IV iron during admission.  - CBC today, no bleeding issues.   5. ?RV Lead vegetation - possible that this is due to the "curl-i-cue" that the lead forms in the right heart, as noted on CXR.  However, need to rule out vegetation.  - BCx NGTD  - No fevers - As above, will have TEE/DCCV soon, and can further assess on TEE.   6. PVCs - High PVC burden. ~ 50% during admission - ECG with frequent PVCs today. - May be cause/contributing to his CM - Continue amiodarone 200 mg bid - May need addition of mexelitine  - Consider sleep study next.   Follow up 2 weeks after TEE/DCCV with APP.  Prince Rome, FNP-BC 08/23/22

## 2022-08-23 NOTE — H&P (View-Only) (Signed)
 ADVANCED HF CLINIC CONSULT NOTE   Primary Care: Berwanger, [name], NP HF Cardiologist: Dr. Bensimhon  HPI: Austin [name] is a 67 y.o. old with a history of chronic HFrEF, HTN, HLD, nonobstructive CAD 2012, CKD Stage III, PAF, and single chamber Biotronik .    EF initially down 2012 to 20%. EF improved in 2013 to 50-55%.    Cath (2015): LCX 20% OMI, otherwise ok.  Echo (2022): EF 25-30% RV normal.  Echo (03/2022): EF 15-20% RV normal.    Admitted to [facility] Jan, Feb, March, April, and May of 2024 with A/C HFrEF. Last noted to be in SR in March 2024.   He decided he wanted to come back to [place] to re-establish with Dr Bensimhon and Heart Failure Team. He has been having trouble paying for medications.    Admitted 6/24 with a/c HF and AF. Echo showed EF < 20% RV normal. Diuresed with IV lasix, given IV iron and started on IV amiodarone. R/LHC showed minimal CAD, severe NICM EF 10%, mildly elevated filling pressures and severely reduced CI 1.9. AF was rate controlled, with frequent PVCs. Chemically converted to NSR on amio. LVAD work up initiated. Drips weaned and GDMT titrated. He was discharged home, weight 204 lbs. Plan to repeat echo in 4-6 weeks after GDMT and PVC suppression, with eye toward LVAD if EF not improved.  Today he returns for HF follow up. Overall feeling fine. He has SOB walking in heat. No issues walking on flat ground or with ADLs. Occasional dizziness when is in the heat, walks outside with his cat early in the morning. Denies palpitations, CP, abnormal bleeding, edema, or PND/Orthopnea. Appetite ok. No fever or chills. Weight at home 218-220 pounds. Some confusion about his medications, has missed some doses of Eliquis. Lives alone and is able to drive to appoints. Works part time at [employer].  Has family in [place].    Cardiac Studies  - R/LHC (6/24): minimal CAD, severe NICM EF 10% RA 9, PA 34/19 ( 27), PCWP 20 (v waves to 30), CO/CI (thermo) 4.3/1.9, PVR  1.6   - Echo (6/24): EF < 20%, echodensity on device in RV, RV moderately down, mild MR, ascending aorta 40 mm  - Echo (2/24): EF 15-20% RV normal.   - Echo (2022): EF 25-30% RV normal.   - Cath (2015): LCX 20% OMI, otherwise ok.   - Echo (2013): EF 50-55%  - Echo (2012): EF 20%  Review of Systems: [y] = yes, [ ] = no   General: Weight gain [ ]; Weight loss [ ]; Anorexia [ ]; Fatigue [ ]; Fever [ ]; Chills [ ]; Weakness [ ]  Cardiac: Chest pain/pressure [ ]; Resting SOB [ ]; Exertional SOB [ ]; Orthopnea [ ]; Pedal Edema [ ]; Palpitations [ ]; Syncope [ ]; Presyncope [ ]; Paroxysmal nocturnal dyspnea[ ]  Pulmonary: Cough [ ]; Wheezing[ ]; Hemoptysis[ ]; Sputum [ ]; Snoring [ ]  GI: Vomiting[ ]; Dysphagia[ ]; Melena[ ]; Hematochezia [ ]; Heartburn[ ]; Abdominal pain [ ]; Constipation [ ]; Diarrhea [ ]; BRBPR [ ]  GU: Hematuria[ ]; Dysuria [ ]; Nocturia[ ]  Vascular: Pain in legs with walking [ ]; Pain in feet with lying flat [ ]; Non-healing sores [ ]; Stroke [ ]; TIA [ ]; Slurred speech [ ];  Neuro: Headaches[ ]; Vertigo[ ]; Seizures[ ]; Paresthesias[ ];Blurred vision [ ]; Diplopia [ ]; Vision changes [ ]  Ortho/Skin: Arthritis [ ];   Joint pain [ ]; Muscle pain [ ]; Joint swelling [ ]; Back Pain [ ]; Rash [ ]  Psych: Depression[ ]; Anxiety[ ]  Heme: Bleeding problems [ ]; Clotting disorders [ ]; Anemia [ y]  Endocrine: Diabetes [ ]; Thyroid dysfunction[ ]  Past Medical History:  Diagnosis Date   Benign prostatic hypertrophy    Hyperlipidemia    Nonischemic cardiomyopathy (HCC) Oct 2012   cath 12/10/10 minimal nonobstructive coronary artery disease   Systolic heart failure Oct 2012   echo 12/08/10 EF 20%, severe left ventricular enlargement, mild right ventricular enlargement    Current Outpatient Medications  Medication Sig Dispense Refill   acetaminophen (TYLENOL) 325 MG tablet Take 2 tablets (650 mg total) by mouth every 4 (four) hours as needed for headache or mild pain.      albuterol (VENTOLIN HFA) 108 (90 Base) MCG/ACT inhaler Inhale 1-2 puffs into the lungs every 6 (six) hours as needed.     amiodarone (PACERONE) 200 MG tablet Take 1 tablet (200 mg total) by mouth 2 (two) times daily. 60 tablet 0   apixaban (ELIQUIS) 5 MG TABS tablet Take 5 mg by mouth 2 (two) times daily.     Cholecalciferol 50 MCG (2000 UT) TABS Take 1 tablet by mouth daily.     digoxin (LANOXIN) 0.125 MG tablet Take 1 tablet (0.125 mg total) by mouth daily. 30 tablet 0   fenofibrate micronized (LOFIBRA) 67 MG capsule Take 67 mg by mouth every morning.     ferrous sulfate 325 (65 FE) MG tablet Take 325 mg by mouth.     finasteride (PROSCAR) 5 MG tablet Take 1 tablet by mouth daily.     hydrocortisone 2.5 % cream Apply 1 Application topically as needed.     JARDIANCE 10 MG TABS tablet Take 10 mg by mouth daily.     ketoconazole (NIZORAL) 2 % shampoo Apply 1 Application topically as needed.     loratadine (CLARITIN) 10 MG tablet Take 10 mg by mouth as needed.     losartan (COZAAR) 25 MG tablet Take 0.5 tablets (12.5 mg total) by mouth daily. 15 tablet 0   melatonin 3 MG TABS tablet Take 3 mg by mouth at bedtime.     omeprazole (PRILOSEC) 20 MG capsule Take 20 mg by mouth daily.     pravastatin (PRAVACHOL) 40 MG tablet Take 1 tablet (40 mg total) by mouth daily. 90 tablet 2   spironolactone (ALDACTONE) 25 MG tablet Take 0.5 tablets (12.5 mg total) by mouth daily. 15 tablet 0   Tamsulosin HCl (FLOMAX) 0.4 MG CAPS Take 0.4 mg by mouth daily after supper.     torsemide (DEMADEX) 20 MG tablet Take 2 tablets (40 mg total) by mouth daily. 60 tablet 0   No current facility-administered medications for this encounter.   Allergies  Allergen Reactions   Lisinopril Cough   Social History   Socioeconomic History   Marital status: Single    Spouse name: Not on file   Number of children: Not on file   Years of education: Not on file   Highest education level: Not on file  Occupational History    Not on file  Tobacco Use   Smoking status: Former    Packs/day: 2.00    Years: 14.00    Additional pack years: 0.00    Total pack years: 28.00    Types: Cigarettes    Quit date: 02/14/1993    Years since quitting: 29.5   Smokeless tobacco:   Not on file  Substance and Sexual Activity   Alcohol use: Yes    Comment: occasionally, once a month   Drug use: Not on file   Sexual activity: Not on file  Other Topics Concern   Not on file  Social History Narrative   He is single and lives in .  He drives a refrigerator truck.   Social Determinants of Health   Financial Resource Strain: Not on file  Food Insecurity: No Food Insecurity (08/07/2022)   Hunger Vital Sign    Worried About Running Out of Food in the Last Year: Never true    Ran Out of Food in the Last Year: Never true  Transportation Needs: No Transportation Needs (08/07/2022)   PRAPARE - Transportation    Lack of Transportation (Medical): No    Lack of Transportation (Non-Medical): No  Physical Activity: Not on file  Stress: Not on file  Social Connections: Not on file  Intimate Partner Violence: Not At Risk (08/07/2022)   Humiliation, Afraid, Rape, and Kick questionnaire    Fear of Current or Ex-Partner: No    Emotionally Abused: No    Physically Abused: No    Sexually Abused: No    Family History  Problem Relation Age of Onset   Breast cancer Mother    Cancer Father    Heart attack Brother    Heart attack Brother 42   BP 122/70   Pulse 62   Wt 99.1 kg (218 lb 6.4 oz)   SpO2 97%   BMI 26.58 kg/m   Wt Readings from Last 3 Encounters:  08/23/22 99.1 kg (218 lb 6.4 oz)  08/12/22 93.1 kg (205 lb 4 oz)  03/28/12 112.7 kg (248 lb 8 oz)   PHYSICAL EXAM: General:  NAD. No resp difficulty, walked into clinic HEENT: Normal Neck: Supple. No JVD. Carotids 2+ bilat; no bruits. No lymphadenopathy or thryomegaly appreciated. Cor: PMI nondisplaced. Irregular rate & rhythm. No rubs, gallops or murmurs. Lungs:  Clear Abdomen: Soft, nontender, nondistended. No hepatosplenomegaly. No bruits or masses. Good bowel sounds. Extremities: No cyanosis, clubbing, rash, edema Neuro: Alert & oriented x 3, cranial nerves grossly intact. Moves all 4 extremities w/o difficulty. Affect pleasant.  ECG (personally reviewed): AF with PVCs, HR 71  ReDs: 31%  ASSESSMENT & PLAN: 1. Chronic Systolic Heart Failure - NICM  - Initially diagnosed with HFrEF in 2012. EF previously improved to 50-55% on medications.  - He had been lost to follow up in the HF clinic at [facility] and had been followed at [facility]. Most recent - cath 2015 showed nonobstructive LCX OM 20%.   - Echo (2022): EF went back down 25-30% and has continued to drop.  - Echo (6/24): EF now < 20%. He does have single chamber Biotronik.  - LHC this admit w/ minimal CAD - Possible cardiomyopathy from high PVC burden.  - RHC c/w severe NICM w/ mildly elevated filling pressures and severely reduced CO/CI by thermo 4.3/1.9.  - Will need eventual cMRI, had iron infusion during admit. - Improved NYHA I-II today, volume OK but weight is up 12 lbs, REDs 31%.  - Continue torsemide 40 mg daily. - Continue Jardiance 10 mg daily. - Continue digoxin 0.125 mg daily - Continue spironolactone 12.5 mg daily  - Add Entresto next. Continue to try to suppress PVCs and maintain NSR. If EF not improving w/ in 4-6 weeks w/ suppression, will need to consider LVAD.  - Labs today.   2. A fib  -   In NSR 04/2022. - Back in AF 5/24 during an admission - Started on amiodarone during most recent admission, chemically converted to NSR - AF with PVCs on ECG today, rate controlled. - Continue amiodarone 200 mg bid. - Continue Eliquis 5 mg bid, has missed some doses. Discussed importance of med compliance. - Arrange TEE/DC-CV with Dr. Bensimhon this week. Suspect he will not tolerate AF for long, discussed with Dr. Bensimhon - CBC today.  Informed Consent   Shared Decision  Making/Informed Consent2 The risks [stroke, cardiac arrhythmias rarely resulting in the need for a temporary or permanent pacemaker, skin irritation or burns, esophageal damage, perforation (1:10,000 risk), bleeding, pharyngeal hematoma as well as other potential complications associated with conscious sedation including aspiration, arrhythmia, respiratory failure and death], benefits (treatment guidance, restoration of normal sinus rhythm, diagnostic support) and alternatives of a transesophageal echocardiogram guided cardioversion were discussed in detail with Mr. Elliott and he is willing to proceed. - consider sleep study down the road - Consider eventual EP referral for ablation    3. CKD Stage IIIa - Baseline SCr 1 - Continue SGLT2i - Labs today   4. Anemia, IDA - Given IV iron during admission.  - CBC today, no bleeding issues.   5. ?RV Lead vegetation - possible that this is due to the "curl-i-cue" that the lead forms in the right heart, as noted on CXR.  However, need to rule out vegetation.  - BCx NGTD  - No fevers - As above, will have TEE/DCCV soon, and can further assess on TEE.   6. PVCs - High PVC burden. ~ 50% during admission - ECG with frequent PVCs today. - May be cause/contributing to his CM - Continue amiodarone 200 mg bid - May need addition of mexelitine  - Consider sleep study next.   Follow up 2 weeks after TEE/DCCV with APP.  Clarece Drzewiecki, FNP-BC 08/23/22  

## 2022-08-23 NOTE — Patient Instructions (Signed)
Medication Changes:  No Changes In Medications at this time.   PLEASE TAKE ALL OF YOUR MEDICATIONS AS DIRECTED.   PLEASE FOLLOW INSTRUCTIONS FOR YOUR PROCEDURE AND MEDICATIONS   Lab Work:  Labs done today, your results will be available in MyChart, we will contact you for abnormal readings.  Testing/Procedures:  Your physician has recommended that you have a Cardioversion (DCCV). Electrical Cardioversion uses a jolt of electricity to your heart either through paddles or wired patches attached to your chest. This is a controlled, usually prescheduled, procedure. Defibrillation is done under light anesthesia in the hospital, and you usually go home the day of the procedure. This is done to get your heart back into a normal rhythm. You are not awake for the procedure. Please see the instruction sheet given to you today.  Special Instructions // Education:  You are scheduled for a TEE/Cardioversion/TEE Cardioversion on FRIDAY JULY 12 with Dr. [name].  Please arrive at the [facility] (Main Entrance A) at [facility]: [address] [place], [place] [address] at 1230 pm. (1 hour prior to procedure unless lab work is needed; if lab work is needed arrive 1.5 hours ahead)  DIET: Nothing to eat or drink after midnight except a sip of water with medications (see medication instructions below)  Medication Instructions:  Hold DO NOT TAKE JARDIANCE FOR 3 DAYS PRIOR TO PROCEDURE   DO NOT TAKE SPIRONOLACTONE THE MORNING OF PROCEDURE   DO NOT TAKE TORSEMIDE THE MORNING OF PROCEDURE    Continue your anticoagulant: ELIQUIS You will need to continue your anticoagulant after your procedure until you  are told by your  Provider that it is safe to stop   Labs: TODAY Labs done today, your results will be available in MyChart, we will contact you for abnormal readings.   You must have a responsible person to drive you home and stay in the waiting area during your procedure. Failure to do so  could result in cancellation.  Bring your insurance cards.  *Special Note: Every effort is made to have your procedure done on time. Occasionally there are emergencies that occur at the hospital that may cause delays. Please be patient if a delay does occur.   Follow-Up in: 2 WEEKS AS SCHEDULED WITH APP   At the [facility], you and your health needs are our priority. We have a designated team specialized in the treatment of Heart Failure. This Care Team includes your primary Heart Failure Specialized Cardiologist (physician), Advanced Practice Providers (APPs- Physician Assistants and Nurse Practitioners), and Pharmacist who all work together to provide you with the care you need, when you need it.   You may see any of the following providers on your designated Care Team at your next follow up:  Dr. Arvilla Meres Dr. Marca Ancona Dr. Marcos Eke, NP Robbie Lis, [place] [facility] [place], [place] Brynda Peon, NP Karle Plumber, PharmD   Please be sure to bring in all your medications bottles to every appointment.   Need to Contact us:  If you have any questions or concerns before your next appointment please send [place] a message through [place] or call our office at [phone].    TO LEAVE A MESSAGE FOR THE NURSE SELECT OPTION 2, PLEASE LEAVE A MESSAGE INCLUDING: YOUR NAME DATE OF BIRTH CALL BACK NUMBER REASON FOR CALL**this is important as we prioritize the call backs  YOU WILL RECEIVE A CALL BACK THE SAME DAY AS LONG AS YOU  CALL BEFORE 4:00 PM

## 2022-08-24 ENCOUNTER — Telehealth (HOSPITAL_COMMUNITY): Payer: Self-pay | Admitting: *Deleted

## 2022-08-25 NOTE — Progress Notes (Signed)
Unable to leave a voicemail for patient due to it not being setup.

## 2022-08-26 ENCOUNTER — Ambulatory Visit (HOSPITAL_COMMUNITY)
Admission: RE | Admit: 2022-08-26 | Discharge: 2022-08-26 | Disposition: A | Discharge status: Home / Self Care | Payer: Medicare PPO | Source: Ambulatory Visit | Attending: Internal Medicine | Admitting: Internal Medicine

## 2022-08-26 ENCOUNTER — Other Ambulatory Visit: Payer: Self-pay

## 2022-08-26 ENCOUNTER — Encounter (HOSPITAL_COMMUNITY)
Admission: RE | Disposition: A | Discharge status: Home / Self Care | Payer: Self-pay | Source: Ambulatory Visit | Attending: Internal Medicine

## 2022-08-26 ENCOUNTER — Ambulatory Visit (HOSPITAL_COMMUNITY): Payer: Medicare PPO | Attending: Internal Medicine

## 2022-08-26 ENCOUNTER — Ambulatory Visit (HOSPITAL_COMMUNITY): Payer: Medicare PPO | Admitting: Anesthesiology

## 2022-08-26 ENCOUNTER — Ambulatory Visit (HOSPITAL_BASED_OUTPATIENT_CLINIC_OR_DEPARTMENT_OTHER): Payer: Medicare PPO | Admitting: Anesthesiology

## 2022-08-26 DIAGNOSIS — I4891 Unspecified atrial fibrillation: Secondary | ICD-10-CM

## 2022-08-26 DIAGNOSIS — I361 Nonrheumatic tricuspid (valve) insufficiency: Secondary | ICD-10-CM

## 2022-08-26 DIAGNOSIS — D509 Iron deficiency anemia, unspecified: Secondary | ICD-10-CM | POA: Diagnosis not present

## 2022-08-26 DIAGNOSIS — I5023 Acute on chronic systolic (congestive) heart failure: Secondary | ICD-10-CM

## 2022-08-26 DIAGNOSIS — I130 Hypertensive heart and chronic kidney disease with heart failure and stage 1 through stage 4 chronic kidney disease, or unspecified chronic kidney disease: Secondary | ICD-10-CM | POA: Diagnosis not present

## 2022-08-26 DIAGNOSIS — I340 Nonrheumatic mitral (valve) insufficiency: Secondary | ICD-10-CM | POA: Diagnosis not present

## 2022-08-26 DIAGNOSIS — I083 Combined rheumatic disorders of mitral, aortic and tricuspid valves: Secondary | ICD-10-CM | POA: Insufficient documentation

## 2022-08-26 DIAGNOSIS — Z7901 Long term (current) use of anticoagulants: Secondary | ICD-10-CM | POA: Diagnosis not present

## 2022-08-26 DIAGNOSIS — I5022 Chronic systolic (congestive) heart failure: Secondary | ICD-10-CM | POA: Diagnosis not present

## 2022-08-26 DIAGNOSIS — Z87891 Personal history of nicotine dependence: Secondary | ICD-10-CM

## 2022-08-26 DIAGNOSIS — Z79899 Other long term (current) drug therapy: Secondary | ICD-10-CM | POA: Insufficient documentation

## 2022-08-26 DIAGNOSIS — I480 Paroxysmal atrial fibrillation: Secondary | ICD-10-CM | POA: Insufficient documentation

## 2022-08-26 DIAGNOSIS — I428 Other cardiomyopathies: Secondary | ICD-10-CM | POA: Insufficient documentation

## 2022-08-26 DIAGNOSIS — I493 Ventricular premature depolarization: Secondary | ICD-10-CM | POA: Diagnosis not present

## 2022-08-26 DIAGNOSIS — E785 Hyperlipidemia, unspecified: Secondary | ICD-10-CM | POA: Insufficient documentation

## 2022-08-26 DIAGNOSIS — I700 Atherosclerosis of aorta: Secondary | ICD-10-CM | POA: Insufficient documentation

## 2022-08-26 DIAGNOSIS — I2510 Atherosclerotic heart disease of native coronary artery without angina pectoris: Secondary | ICD-10-CM | POA: Insufficient documentation

## 2022-08-26 DIAGNOSIS — N1831 Chronic kidney disease, stage 3a: Secondary | ICD-10-CM | POA: Diagnosis not present

## 2022-08-26 DIAGNOSIS — I110 Hypertensive heart disease with heart failure: Secondary | ICD-10-CM | POA: Diagnosis not present

## 2022-08-26 DIAGNOSIS — I4819 Other persistent atrial fibrillation: Secondary | ICD-10-CM | POA: Insufficient documentation

## 2022-08-26 HISTORY — PX: CARDIOVERSION: SHX1299

## 2022-08-26 HISTORY — PX: TEE WITHOUT CARDIOVERSION: SHX5443

## 2022-08-26 SURGERY — ECHOCARDIOGRAM, TRANSESOPHAGEAL
Anesthesia: Monitor Anesthesia Care

## 2022-08-26 MED ORDER — PHENYLEPHRINE 80 MCG/ML (10ML) SYRINGE FOR IV PUSH (FOR BLOOD PRESSURE SUPPORT)
PREFILLED_SYRINGE | INTRAVENOUS | Status: DC | PRN
  Administered 2022-08-26: 30 ug via INTRAVENOUS

## 2022-08-26 MED ORDER — PROPOFOL 500 MG/50ML IV EMUL
INTRAVENOUS | Status: DC | PRN
  Administered 2022-08-26: 25 ug/kg/min via INTRAVENOUS

## 2022-08-26 MED ORDER — PHENYLEPHRINE HCL-NACL 20-0.9 MG/250ML-% IV SOLN
INTRAVENOUS | Status: DC | PRN
  Administered 2022-08-26: 30 ug/min via INTRAVENOUS

## 2022-08-26 MED ORDER — SODIUM CHLORIDE 0.9 % IV SOLN: INTRAVENOUS | Status: DC

## 2022-08-26 MED ORDER — KETAMINE HCL 50 MG/5ML IJ SOSY
PREFILLED_SYRINGE | INTRAMUSCULAR | Status: AC
  Filled 2022-08-26: qty 5

## 2022-08-26 MED ORDER — GLYCOPYRROLATE 0.2 MG/ML IJ SOLN
INTRAMUSCULAR | Status: DC | PRN
  Administered 2022-08-26: .1 mg via INTRAVENOUS

## 2022-08-26 MED ORDER — LIDOCAINE 2% (20 MG/ML) 5 ML SYRINGE
INTRAMUSCULAR | Status: DC | PRN
  Administered 2022-08-26: 100 mg via INTRAVENOUS

## 2022-08-26 MED ORDER — KETAMINE HCL 10 MG/ML IJ SOLN
INTRAMUSCULAR | Status: DC | PRN
  Administered 2022-08-26: 20 mg via INTRAVENOUS

## 2022-08-26 MED ORDER — PROPOFOL 10 MG/ML IV BOLUS
INTRAVENOUS | Status: DC | PRN
Start: 2022-08-26 — End: 2022-08-26
  Administered 2022-08-26 (×3): 10 mg via INTRAVENOUS
  Administered 2022-08-26: 20 mg via INTRAVENOUS

## 2022-08-26 SURGICAL SUPPLY — 1 items: ELECT DEFIB PAD ADLT CADENCE (PAD) ×1 IMPLANT

## 2022-08-26 NOTE — Discharge Instructions (Signed)

## 2022-08-26 NOTE — Interval H&P Note (Signed)
History and Physical Interval Note:  08/26/2022 12:04 PM  Austin [name]  has presented today for surgery, with the diagnosis of AFIB.  The various methods of treatment have been discussed with the patient and family. After consideration of risks, benefits and other options for treatment, the patient has consented to  Procedure(s): TRANSESOPHAGEAL ECHOCARDIOGRAM (N/A) CARDIOVERSION (N/A) as a surgical intervention.  The patient's history has been reviewed, patient examined, no change in status, stable for surgery.  I have reviewed the patient's chart and labs.  Questions were answered to the patient's satisfaction.     Venetia Prewitt

## 2022-08-26 NOTE — CV Procedure (Signed)
    TRANSESOPHAGEAL ECHOCARDIOGRAM   NAME:  ESTIVEN PHILLIPE   MRN: [id number] DOB:  1955-06-07   ADMIT DATE: 08/26/2022  INDICATIONS:  Possible vegetation on ICD wire  PROCEDURE:   Informed consent was obtained prior to the procedure. The risks, benefits and alternatives for the procedure were discussed and the patient comprehended these risks.  Risks include, but are not limited to, cough, sore throat, vomiting, nausea, somnolence, esophageal and stomach trauma or perforation, bleeding, low blood pressure, aspiration, pneumonia, infection, trauma to the teeth and death.    After a procedural time-out, the patient was sedated by the anesthesia service. The transesophageal probe was inserted in the esophagus and stomach without difficulty and multiple views were obtained.    COMPLICATIONS:    There were no immediate complications.  FINDINGS:  LEFT VENTRICLE: EF = < 20%% Global HK.  RIGHT VENTRICLE: Moderate HK  LEFT ATRIUM: Moderately dilated  LEFT ATRIAL APPENDAGE: No thrombus.   RIGHT ATRIUM: Normal  AORTIC VALVE:  Trileaflet. Trivial AI  MITRAL VALVE:   Posterior leaflet is retracted. Mild to moderate MR  TRICUSPID VALVE: Normal. Moderate TR. ICD wire with multiple calcifications but no obvious vegetations.   PULMONIC VALVE: Grossly normal. Trivial PI  INTERATRIAL SEPTUM: No PFO or ASD.  PERICARDIUM: No effusion  DESCENDING AORTA: Moderate plaque    Truman Hayward 12:46 PM

## 2022-08-26 NOTE — Transfer of Care (Signed)
Immediate Anesthesia Transfer of Care Note  Patient: Austin [name]  Procedure(s) Performed: TRANSESOPHAGEAL ECHOCARDIOGRAM CARDIOVERSION  Patient Location: PACU  Anesthesia Type:General  Level of Consciousness: awake, alert , and drowsy  Airway & Oxygen Therapy: Patient connected to nasal cannula oxygen  Post-op Assessment: Report given to RN, Post -op Vital signs reviewed and stable, and Patient moving all extremities  Post vital signs: stable  Last Vitals:  Vitals Value Taken Time  BP    Temp    Pulse    Resp    SpO2      Last Pain:  Vitals:   08/26/22 1200  TempSrc: Temporal  PainSc: 0-No pain         Complications: No notable events documented.

## 2022-08-26 NOTE — Anesthesia Postprocedure Evaluation (Signed)
Anesthesia Post Note  Patient: Austin [name]  Procedure(s) Performed: TRANSESOPHAGEAL ECHOCARDIOGRAM CARDIOVERSION     Patient location during evaluation: PACU Anesthesia Type: MAC Level of consciousness: awake and alert Pain management: pain level controlled Vital Signs Assessment: post-procedure vital signs reviewed and stable Respiratory status: spontaneous breathing, nonlabored ventilation and respiratory function stable Cardiovascular status: stable and blood pressure returned to baseline Anesthetic complications: no   No notable events documented.  Last Vitals:  Vitals:   08/26/22 1315 08/26/22 1320  BP:  114/80  Pulse: 68 66  Resp: 19 (!) 22  Temp:    SpO2: 100% (!) 89%    Last Pain:  Vitals:   08/26/22 1253  TempSrc: Temporal  PainSc: 0-No pain                 Beryle Lathe

## 2022-08-26 NOTE — Progress Notes (Signed)
  Echocardiogram 2D Echocardiogram has been performed.  Milda Smart 08/26/2022, 1:04 PM

## 2022-08-26 NOTE — Anesthesia Preprocedure Evaluation (Addendum)
Anesthesia Evaluation  Patient identified by MRN, date of birth, ID band Patient awake    Reviewed: Allergy & Precautions, NPO status , Patient's Chart, lab work & pertinent test results  History of Anesthesia Complications Negative for: history of anesthetic complications  Airway Mallampati: II  TM Distance: >3 FB Neck ROM: Full    Dental  (+) Dental Advisory Given, Partial Upper, Partial Lower   Pulmonary former smoker   Pulmonary exam normal        Cardiovascular hypertension, Pt. on medications +CHF  Normal cardiovascular exam   '24 RHC/LHC - Assessment: 1. Minimal CAD 2. Severe NICM EF 10% 3. Mildly elevated filling pressures with severely reduced CO by Thermo  '24 TTE - EF <20%. Global hypokinesis. The left ventricular internal cavity size was severely dilated. Echodensity noted on device lead in RV. Cannot exclude vegetation. Right ventricular systolic function is moderately reduced. The right  ventricular size is moderately enlarged. Left atrial size was mildly dilated. Right atrial size was moderately dilated. Mild mitral valve regurgitation. There is mild dilatation of the ascending aorta, measuring 40 mm.     Neuro/Psych negative neurological ROS  negative psych ROS   GI/Hepatic negative GI ROS, Neg liver ROS,,,  Endo/Other  negative endocrine ROS    Renal/GU negative Renal ROS     Musculoskeletal negative musculoskeletal ROS (+)    Abdominal   Peds  Hematology  On eliquis    Anesthesia Other Findings   Reproductive/Obstetrics                             Anesthesia Physical Anesthesia Plan  ASA: 4  Anesthesia Plan: MAC   Post-op Pain Management:    Induction:   PONV Risk Score and Plan: 1 and Propofol infusion and Treatment may vary due to age or medical condition  Airway Management Planned: Nasal Cannula and Natural Airway  Additional Equipment:  None  Intra-op Plan:   Post-operative Plan:   Informed Consent: I have reviewed the patients History and Physical, chart, labs and discussed the procedure including the risks, benefits and alternatives for the proposed anesthesia with the patient or authorized representative who has indicated his/her understanding and acceptance.       Plan Discussed with: CRNA and Anesthesiologist  Anesthesia Plan Comments:         Anesthesia Quick Evaluation

## 2022-08-29 ENCOUNTER — Encounter (HOSPITAL_COMMUNITY): Payer: Self-pay | Admitting: Internal Medicine

## 2022-08-31 LAB — FACTOR 5 LEIDEN

## 2022-08-31 LAB — ECHO TEE
Est EF: <20
Radius: 0.7 cm

## 2022-09-05 NOTE — Progress Notes (Signed)
ADVANCED HF CLINIC NOTE   Primary Care: [name], NP HF Cardiologist: Dr. [name]  HPI: [name] is a 67 y.o. old with a history of chronic HFrEF, HTN, HLD, nonobstructive CAD 2012, CKD Stage III, PAF, and single chamber Biotronik .    EF initially down 2012 to 20%. EF improved in 2013 to 50-55%.    Cath (2015): LCX 20% OMI, otherwise ok.  Echo (2022): EF 25-30% RV normal.  Echo (03/2022): EF 15-20% RV normal.    Admitted to [facility] Jan, Feb, March, April, and May of 2024 with A/C HFrEF. Last noted to be in SR in March 2024.   He decided he wanted to come back to [facility] to re-establish with Dr [name] and Heart Failure Team. He has been having trouble paying for medications.    Admitted 6/24 with a/c HF and AF. Echo showed EF < 20% RV normal. Diuresed with IV lasix, given IV iron and started on IV amiodarone. R/LHC showed minimal CAD, severe NICM EF 10%, mildly elevated filling pressures and severely reduced CI 1.9. AF was rate controlled, with frequent PVCs. Chemically converted to NSR on amio. LVAD work up initiated. Drips weaned and GDMT titrated. He was discharged home, weight 204 lbs. Plan to repeat echo in 4-6 weeks after GDMT and PVC suppression, with eye toward LVAD if EF not improved.  Post hospital follow up, found to be back in AF, rate controlled.  NYHA I-II, weight up 12 lbs but volume stable with REDs 31%. He was on amiodarone 200 bid, had missed some Eliquis doses so arranged for TEE/DCCV.   S/p TEE 08/26/22 LVEF < 20%, global HK, RV moderate HK, mild to moderate MR with posterior leaflet retracted, moderate TR, no LAA thrombus   Cardiac Studies - TEE (7/24): LVEF < 20%, global HK, RV moderate HK, mild to moderate MR with posterior leaflet retracted, moderate TR, no LAA thrombus  - R/LHC (6/24): minimal CAD, severe NICM EF 10% RA 9, PA 34/19 ( 27), PCWP 20 (v waves to 30), CO/CI (thermo) 4.3/1.9, PVR 1.6   - Echo (6/24): EF < 20%, echodensity on  device in RV, RV moderately down, mild MR, ascending aorta 40 mm  - Echo (2/24): EF 15-20% RV normal.   - Echo (2022): EF 25-30% RV normal.   - Cath (2015): LCX 20% OMI, otherwise ok.   - Echo (2013): EF 50-55%  - Echo (2012): EF 20%   Past Medical History:  Diagnosis Date   Benign prostatic hypertrophy    Hyperlipidemia    Nonischemic cardiomyopathy [facility]) Oct 2012   cath 12/10/10 minimal nonobstructive coronary artery disease   Systolic heart failure Oct 2012   echo 12/08/10 EF 20%, severe left ventricular enlargement, mild right ventricular enlargement    Current Outpatient Medications  Medication Sig Dispense Refill   acetaminophen (TYLENOL) 325 MG tablet Take 2 tablets (650 mg total) by mouth every 4 (four) hours as needed for headache or mild pain.     albuterol (VENTOLIN HFA) 108 (90 Base) MCG/ACT inhaler Inhale 1-2 puffs into the lungs every 6 (six) hours as needed.     amiodarone (PACERONE) 200 MG tablet Take 1 tablet (200 mg total) by mouth 2 (two) times daily. 60 tablet 0   apixaban (ELIQUIS) 5 MG TABS tablet Take 5 mg by mouth 2 (two) times daily.     Cholecalciferol 50 MCG (2000 UT) TABS Take 1 tablet by mouth daily.     digoxin (LANOXIN) 0.125  MG tablet Take 1 tablet (0.125 mg total) by mouth daily. 30 tablet 0   fenofibrate micronized (LOFIBRA) 67 MG capsule Take 67 mg by mouth every morning.     ferrous sulfate 325 (65 FE) MG tablet Take 325 mg by mouth.     finasteride (PROSCAR) 5 MG tablet Take 1 tablet by mouth daily.     hydrocortisone 2.5 % cream Apply 1 Application topically as needed.     JARDIANCE 10 MG TABS tablet Take 10 mg by mouth daily.     ketoconazole (NIZORAL) 2 % shampoo Apply 1 Application topically as needed.     loratadine (CLARITIN) 10 MG tablet Take 10 mg by mouth as needed.     losartan (COZAAR) 25 MG tablet Take 0.5 tablets (12.5 mg total) by mouth daily. 15 tablet 0   melatonin 3 MG TABS tablet Take 3 mg by mouth at bedtime.      omeprazole (PRILOSEC) 20 MG capsule Take 20 mg by mouth daily.     pravastatin (PRAVACHOL) 40 MG tablet Take 1 tablet (40 mg total) by mouth daily. 90 tablet 2   spironolactone (ALDACTONE) 25 MG tablet Take 0.5 tablets (12.5 mg total) by mouth daily. 15 tablet 0   Tamsulosin HCl (FLOMAX) 0.4 MG CAPS Take 0.4 mg by mouth daily after supper.     torsemide (DEMADEX) 20 MG tablet Take 2 tablets (40 mg total) by mouth daily. 60 tablet 0   No current facility-administered medications for this visit.   Allergies  Allergen Reactions   Lisinopril Cough   Social History   Socioeconomic History   Marital status: Single    Spouse name: Not on file   Number of children: Not on file   Years of education: Not on file   Highest education level: Not on file  Occupational History   Not on file  Tobacco Use   Smoking status: Former    Current packs/day: 0.00    Average packs/day: 2.0 packs/day for 14.0 years (28.0 ttl pk-yrs)    Types: Cigarettes    Start date: 02/15/1979    Quit date: 02/14/1993    Years since quitting: 29.5   Smokeless tobacco: Not on file  Substance and Sexual Activity   Alcohol use: Yes    Comment: occasionally, once a month   Drug use: Not on file   Sexual activity: Not on file  Other Topics Concern   Not on file  Social History Narrative   He is single and lives in [place].  He drives a refrigerator truck.   Social Determinants of Health   Financial Resource Strain: Low Risk  (05/03/2022)   Received from [facility], [facility]   Overall Financial Resource Strain (CARDIA)    Difficulty of Paying Living Expenses: Not hard at all  Recent Concern: Financial Resource Strain - High Risk (03/25/2022)   Received from [facility]   Overall Financial Resource Strain (CARDIA)    Difficulty of Paying Living Expenses: Hard  Food Insecurity: No Food Insecurity (08/07/2022)   Hunger Vital Sign    Worried About Running Out of Food in the Last Year: Never true    Ran Out  of Food in the Last Year: Never true  Transportation Needs: No Transportation Needs (08/07/2022)   PRAPARE - Administrator, Civil Service (Medical): No    Lack of Transportation (Non-Medical): No  Physical Activity: Inactive (06/21/2021)   Received from [facility], [facility]   Exercise Vital Sign  Days of Exercise per Week: 0 days    Minutes of Exercise per Session: 0 min  Stress: No Stress Concern Present (05/14/2022)   Received from [facility], [facility] of Occupational Health - Occupational Stress Questionnaire    Feeling of Stress : Not at all  Social Connections: Unknown (06/22/2022)   Received from [facility], [facility]   Social Network    Social Network: Not on file  Intimate Partner Violence: Not At Risk (08/07/2022)   Humiliation, Afraid, Rape, and Kick questionnaire    Fear of Current or Ex-Partner: No    Emotionally Abused: No    Physically Abused: No    Sexually Abused: No    Family History  Problem Relation Age of Onset   Breast cancer Mother    Cancer Father    Heart attack Brother    Heart attack Brother 42   There were no vitals taken for this visit.  Wt Readings from Last 3 Encounters:  08/26/22 99.8 kg (220 lb)  08/23/22 99.1 kg (218 lb 6.4 oz)  08/12/22 93.1 kg (205 lb 4 oz)   PHYSICAL EXAM: General:  NAD. No resp difficulty, walked into clinic HEENT: Normal Neck: Supple. No JVD. Carotids 2+ bilat; no bruits. No lymphadenopathy or thryomegaly appreciated. Cor: PMI nondisplaced. Irregular rate & rhythm. No rubs, gallops or murmurs. Lungs: Clear Abdomen: Soft, nontender, nondistended. No hepatosplenomegaly. No bruits or masses. Good bowel sounds. Extremities: No cyanosis, clubbing, rash, edema Neuro: Alert & oriented x 3, cranial nerves grossly intact. Moves all 4 extremities w/o difficulty. Affect pleasant.  ECG (personally reviewed):   ReDs:  ASSESSMENT & PLAN: 1. Chronic Systolic Heart  Failure - NICM  - Initially diagnosed with HFrEF in 2012. EF previously improved to 50-55% on medications.  - He had been lost to follow up in the HF clinic at [facility] and had been followed at [facility]. Most recent - cath 2015 showed nonobstructive LCX OM 20%.   - Echo (2022): EF went back down 25-30% and has continued to drop.  - Echo (6/24): EF now < 20%. He does have single chamber Biotronik.  - LHC this admit w/ minimal CAD - Possible cardiomyopathy from high PVC burden.  - RHC c/w severe NICM w/ mildly elevated filling pressures and severely reduced CO/CI by thermo 4.3/1.9.  - Will need eventual cMRI, had iron infusion during admit. - Improved NYHA I-II today, volume OK but weight is up 12 lbs, REDs 31%.  - Continue torsemide 40 mg daily. - Continue Jardiance 10 mg daily. - Continue digoxin 0.125 mg daily - Continue spironolactone 12.5 mg daily  - Add Entresto next. Continue to try to suppress PVCs and maintain NSR. If EF not improving w/ in 4-6 weeks w/ suppression, will need to consider LVAD.  - Labs today.   2. A fib  - In NSR 04/2022. - Back in AF 5/24 during an admission - Started on amiodarone during most recent admission, chemically converted to NSR - AF with PVCs on ECG today, rate controlled. - Continue amiodarone 200 mg bid. - Continue Eliquis 5 mg bid, has missed some doses. Discussed importance of med compliance. - Arrange TEE/DC-CV with Dr. [name] this week. Suspect he will not tolerate AF for long, discussed with Dr. [name] - CBC today.  Informed Consent   Shared Decision Making/Informed Consent2 The risks [stroke, cardiac arrhythmias rarely resulting in the need for a temporary or permanent pacemaker, skin irritation or burns, esophageal  damage, perforation (1:10,000 risk), bleeding, pharyngeal hematoma as well as other potential complications associated with conscious sedation including aspiration, arrhythmia, respiratory failure and death], benefits  (treatment guidance, restoration of normal sinus rhythm, diagnostic support) and alternatives of a transesophageal echocardiogram guided cardioversion were discussed in detail with Mr. Scaife and he is willing to proceed. - consider sleep study down the road - Consider eventual EP referral for ablation    3. CKD Stage IIIa - Baseline SCr 1 - Continue SGLT2i - Labs today   4. Anemia, IDA - Given IV iron during admission.  - CBC today, no bleeding issues.   5. ?RV Lead vegetation - possible that this is due to the "curl-i-cue" that the lead forms in the right heart, as noted on CXR.  However, need to rule out vegetation.  - BCx NGTD  - No fevers - As above, will have TEE/DCCV soon, and can further assess on TEE.   6. PVCs - High PVC burden. ~ 50% during admission - ECG with frequent PVCs today. - May be cause/contributing to his CM - Continue amiodarone 200 mg bid - May need addition of mexelitine  - Consider sleep study next.   Follow up 2 weeks after TEE/DCCV with APP.  Prince Rome, FNP-BC 09/05/22

## 2022-09-06 ENCOUNTER — Ambulatory Visit (HOSPITAL_COMMUNITY)
Admission: RE | Admit: 2022-09-06 | Discharge: 2022-09-06 | Disposition: A | Discharge status: Home / Self Care | Payer: Medicare PPO | Source: Ambulatory Visit | Attending: Internal Medicine | Admitting: Internal Medicine

## 2022-09-06 ENCOUNTER — Encounter (HOSPITAL_COMMUNITY): Payer: Self-pay

## 2022-09-06 VITALS — BP 124/80 | HR 78 | Wt 212.4 lb

## 2022-09-06 DIAGNOSIS — I428 Other cardiomyopathies: Secondary | ICD-10-CM | POA: Diagnosis not present

## 2022-09-06 DIAGNOSIS — D631 Anemia in chronic kidney disease: Secondary | ICD-10-CM | POA: Diagnosis not present

## 2022-09-06 DIAGNOSIS — Z7984 Long term (current) use of oral hypoglycemic drugs: Secondary | ICD-10-CM | POA: Diagnosis not present

## 2022-09-06 DIAGNOSIS — I2510 Atherosclerotic heart disease of native coronary artery without angina pectoris: Secondary | ICD-10-CM | POA: Insufficient documentation

## 2022-09-06 DIAGNOSIS — I4510 Unspecified right bundle-branch block: Secondary | ICD-10-CM | POA: Diagnosis not present

## 2022-09-06 DIAGNOSIS — E785 Hyperlipidemia, unspecified: Secondary | ICD-10-CM | POA: Diagnosis not present

## 2022-09-06 DIAGNOSIS — N1831 Chronic kidney disease, stage 3a: Secondary | ICD-10-CM | POA: Insufficient documentation

## 2022-09-06 DIAGNOSIS — I130 Hypertensive heart and chronic kidney disease with heart failure and stage 1 through stage 4 chronic kidney disease, or unspecified chronic kidney disease: Secondary | ICD-10-CM | POA: Insufficient documentation

## 2022-09-06 DIAGNOSIS — I5022 Chronic systolic (congestive) heart failure: Secondary | ICD-10-CM | POA: Diagnosis present

## 2022-09-06 DIAGNOSIS — I4891 Unspecified atrial fibrillation: Secondary | ICD-10-CM | POA: Diagnosis not present

## 2022-09-06 DIAGNOSIS — I493 Ventricular premature depolarization: Secondary | ICD-10-CM

## 2022-09-06 DIAGNOSIS — D509 Iron deficiency anemia, unspecified: Secondary | ICD-10-CM | POA: Diagnosis not present

## 2022-09-06 DIAGNOSIS — I5023 Acute on chronic systolic (congestive) heart failure: Secondary | ICD-10-CM

## 2022-09-06 DIAGNOSIS — Z7901 Long term (current) use of anticoagulants: Secondary | ICD-10-CM | POA: Insufficient documentation

## 2022-09-06 DIAGNOSIS — Z79899 Other long term (current) drug therapy: Secondary | ICD-10-CM | POA: Insufficient documentation

## 2022-09-06 DIAGNOSIS — I480 Paroxysmal atrial fibrillation: Secondary | ICD-10-CM | POA: Diagnosis not present

## 2022-09-06 LAB — CBC
HCT: 39.4 % (ref 39.0–52.0)
Hemoglobin: 11.5 g/dL — ABNORMAL LOW (ref 13.0–17.0)
MCH: 23.1 pg — ABNORMAL LOW (ref 26.0–34.0)
MCHC: 29.2 g/dL — ABNORMAL LOW (ref 30.0–36.0)
MCV: 79.1 fL — ABNORMAL LOW (ref 80.0–100.0)
Platelets: 189 10*3/uL (ref 150–400)
RBC: 4.98 MIL/uL (ref 4.22–5.81)
RDW: 20.2 % — ABNORMAL HIGH (ref 11.5–15.5)
WBC: 8 10*3/uL (ref 4.0–10.5)
nRBC: 0 % (ref 0.0–0.2)

## 2022-09-06 LAB — BASIC METABOLIC PANEL
Anion gap: 12 (ref 5–15)
BUN: 13 mg/dL (ref 8–23)
CO2: 27 mmol/L (ref 22–32)
Calcium: 9.5 mg/dL (ref 8.9–10.3)
Chloride: 99 mmol/L (ref 98–111)
Creatinine, Ser: 1.77 mg/dL — ABNORMAL HIGH (ref 0.61–1.24)
GFR, Estimated: 41 mL/min — ABNORMAL LOW (ref >60)
Glucose, Bld: 107 mg/dL — ABNORMAL HIGH (ref 70–99)
Potassium: 3.6 mmol/L (ref 3.5–5.1)
Sodium: 138 mmol/L (ref 135–145)

## 2022-09-06 MED ORDER — AMIODARONE HCL 200 MG PO TABS: 200.0000 mg | ORAL_TABLET | Freq: Every day | ORAL | 3 refills | Status: DC

## 2022-09-06 MED ORDER — SPIRONOLACTONE 25 MG PO TABS: 25.0000 mg | ORAL_TABLET | Freq: Every day | ORAL | 3 refills | Status: DC

## 2022-09-06 MED ORDER — JARDIANCE 10 MG PO TABS: 10.0000 mg | ORAL_TABLET | Freq: Every day | ORAL | 11 refills | Status: DC

## 2022-09-06 MED ORDER — DIGOXIN 125 MCG PO TABS: 0.1250 mg | ORAL_TABLET | Freq: Every day | ORAL | 3 refills | Status: DC

## 2022-09-06 MED ORDER — LOSARTAN POTASSIUM 25 MG PO TABS: 25.0000 mg | ORAL_TABLET | Freq: Every day | ORAL | 3 refills | Status: DC

## 2022-09-06 MED ORDER — TORSEMIDE 20 MG PO TABS: 20.0000 mg | ORAL_TABLET | Freq: Every day | ORAL | 3 refills | Status: DC

## 2022-09-06 NOTE — Progress Notes (Signed)
ReDS Vest / Clip - 09/06/22 1545       ReDS Vest / Clip   Station Marker D    Ruler Value 34    ReDS Value Range Low volume    ReDS Actual Value 27

## 2022-09-06 NOTE — Patient Instructions (Addendum)
INCREASE Spironolactone to 25 mg daily  INCREASE Losartan to 25 mg daily.  DECREASE  Amiodarone to 200 mg daily.  DECREASE Torsemide to 20 mg daily.  Repeat lab work in 1 week.  Your physician recommends that you schedule a follow-up appointment in: 2 months     Do the following things EVERYDAY: Weigh yourself in the morning before breakfast. Write it down and keep it in a log. Take your medicines as prescribed Eat low salt foods--Limit salt (sodium) to 2000 mg per day.  Stay as active as you can everyday Limit all fluids for the day to less than 2 liters   At the [facility], you and your health needs are our priority. As part of our continuing mission to provide you with exceptional heart care, we have created designated Provider Care Teams. These Care Teams include your primary Cardiologist (physician) and Advanced Practice Providers (APPs- Physician Assistants and Nurse Practitioners) who all work together to provide you with the care you need, when you need it.   You may see any of the following providers on your designated Care Team at your next follow up: Dr Arvilla Meres Dr Marca Ancona Dr. Marcos Eke, NP Robbie Lis, [place] [facility], [place] Brynda Peon, NP Karle Plumber, PharmD   Please be sure to bring in all your medications bottles to every appointment.    Thank you for choosing [facility]  If you have any questions or concerns before your next appointment please send [place] a message through [place] or call our office at [phone].    TO LEAVE A MESSAGE FOR THE NURSE SELECT OPTION 2, PLEASE LEAVE A MESSAGE INCLUDING: YOUR NAME DATE OF BIRTH CALL BACK NUMBER REASON FOR CALL**this is important as we prioritize the call backs  YOU WILL RECEIVE A CALL BACK THE SAME DAY AS LONG AS YOU CALL BEFORE 4:00 PM

## 2022-09-08 ENCOUNTER — Telehealth (HOSPITAL_COMMUNITY): Payer: Self-pay

## 2022-09-08 MED ORDER — TORSEMIDE 20 MG PO TABS: 20.0000 mg | ORAL_TABLET | ORAL | 3 refills | Status: DC | PRN

## 2022-09-08 NOTE — Telephone Encounter (Addendum)
Pt aware, agreeable, and verbalized understanding  Med list updated   ----- Message from Alen Bleacher sent at 09/08/2022  7:08 AM EDT ----- Renal function elevated. Stop torsemide and only take as needed for 3 lb weight gain overnight or 5 lb weight gain in 1 week.   Has repeat BMET orderered

## 2022-09-13 ENCOUNTER — Ambulatory Visit (HOSPITAL_COMMUNITY)
Admission: RE | Admit: 2022-09-13 | Discharge: 2022-09-13 | Disposition: A | Discharge status: Home / Self Care | Payer: Medicare PPO | Source: Ambulatory Visit | Attending: Cardiology | Admitting: Cardiology

## 2022-09-13 DIAGNOSIS — I5023 Acute on chronic systolic (congestive) heart failure: Secondary | ICD-10-CM | POA: Insufficient documentation

## 2022-09-13 LAB — BASIC METABOLIC PANEL
Anion gap: 8 (ref 5–15)
BUN: 20 mg/dL (ref 8–23)
CO2: 26 mmol/L (ref 22–32)
Calcium: 9.1 mg/dL (ref 8.9–10.3)
Chloride: 103 mmol/L (ref 98–111)
Creatinine, Ser: 1.76 mg/dL — ABNORMAL HIGH (ref 0.61–1.24)
GFR, Estimated: 41 mL/min — ABNORMAL LOW (ref >60)
Glucose, Bld: 230 mg/dL — ABNORMAL HIGH (ref 70–99)
Potassium: 3.5 mmol/L (ref 3.5–5.1)
Sodium: 137 mmol/L (ref 135–145)

## 2022-11-04 NOTE — Progress Notes (Signed)
ADVANCED HF CLINIC NOTE   Primary Care: [name], NP HF Cardiologist: Dr. [name]  HPI: Mr. Linquist is a 67 y.o. old with a history of chronic HFrEF, HTN, HLD, nonobstructive CAD 2012, CKD Stage III, PAF, and single chamber Biotronik .    EF initially down 2012 to 20%. EF improved in 2013 to 50-55%.    Cath (2015): LCX 20% OMI, otherwise ok.  Echo (2022): EF 25-30% RV normal.  Echo (03/2022): EF 15-20% RV normal.    Admitted to [facility] Jan, Feb, March, April, and May of 2024 with A/C HFrEF. Last noted to be in SR in March 2024.   He decided he wanted to come back to [facility] to re-establish with Dr [name] and Heart Failure Team. Had been having trouble paying for medications.    Admitted 6/24 with a/c HF and AF. Echo showed EF < 20% RV normal. Diuresed with IV lasix, given IV iron and started on IV amiodarone. R/LHC showed minimal CAD, severe NICM EF 10%, mildly elevated filling pressures and severely reduced CI 1.9. AF was rate controlled, with frequent PVCs. Chemically converted to NSR on amio. LVAD work up initiated. Drips weaned and GDMT titrated. He was discharged home, weight 204 lbs. Plan to repeat echo in 4-6 weeks after GDMT and PVC suppression, with eye toward LVAD if EF not improved.  Post hospital follow up, found to be back in AF, rate controlled.  NYHA I-II, weight up 12 lbs but volume stable with REDs 31%. He was on amiodarone 200 bid, had missed some Eliquis doses so arranged for TEE/DCCV.   S/p TEE 08/26/22 LVEF < 20%, global HK, RV moderate HK, mild to moderate MR with posterior leaflet retracted, moderate TR, no LAA thrombus  Today he returns for post TEE follow up. Overall feeling great. Denies palpitations, CP, dizziness, edema, or PND/Orthopnea. No SOB. Appetite great. No fever or chills. Weight at home 210-212 pounds. Taking all medications. Denies smoking, drinks a beer once a month. 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   Cardiac Studies - TEE (7/24): LVEF < 20%, global HK, RV moderate HK, mild to moderate MR with posterior leaflet retracted, moderate TR, no LAA thrombus - R/LHC (6/24): minimal CAD, severe NICM EF 10% RA 9, PA 34/19 ( 27), PCWP 20 (v waves to 30), CO/CI (thermo) 4.3/1.9, PVR 1.6 - Echo (6/24): EF < 20%, echodensity on device in RV, RV moderately down, mild MR, ascending aorta 40 mm - Echo (2/24): EF 15-20% RV normal.  - Echo (2022): EF 25-30% RV normal.  - Cath (2015): LCX 20% OMI, otherwise ok.  - Echo (2013): EF 50-55% - Echo (2012): EF 20%   Past Medical History:  Diagnosis Date   Benign prostatic hypertrophy    Hyperlipidemia    Nonischemic cardiomyopathy [facility]) Oct 2012   cath 12/10/10 minimal nonobstructive coronary artery disease   Systolic heart failure Oct 2012   echo 12/08/10 EF 20%, severe left ventricular enlargement, mild right ventricular enlargement    Current Outpatient Medications  Medication Sig Dispense Refill   acetaminophen (TYLENOL) 325 MG tablet Take 2 tablets (650 mg total) by mouth every 4 (four) hours as needed for headache or mild pain.     albuterol (VENTOLIN HFA) 108 (90 Base) MCG/ACT inhaler Inhale 1-2 puffs into the lungs every 6 (six) hours as needed.     amiodarone (PACERONE) 200 MG tablet Take 1 tablet (200 mg  total) by mouth daily. 90 tablet 3   apixaban (ELIQUIS) 5 MG TABS tablet Take 5 mg by mouth 2 (two) times daily.     Cholecalciferol 50 MCG (2000 UT) TABS Take 1 tablet by mouth daily.     digoxin (LANOXIN) 0.125 MG tablet Take 1 tablet (0.125 mg total) by mouth daily. 90 tablet 3   fenofibrate micronized (LOFIBRA) 67 MG capsule Take 67 mg by mouth every morning.     ferrous sulfate 325 (65 FE) MG tablet Take 325 mg by mouth.     finasteride (PROSCAR) 5 MG tablet Take 1 tablet by mouth daily.     hydrocortisone 2.5 % cream Apply 1 Application topically as needed.     JARDIANCE 10 MG TABS tablet Take 1 tablet (10 mg  total) by mouth daily. 30 tablet 11   ketoconazole (NIZORAL) 2 % shampoo Apply 1 Application topically as needed.     loratadine (CLARITIN) 10 MG tablet Take 10 mg by mouth as needed.     losartan (COZAAR) 25 MG tablet Take 1 tablet (25 mg total) by mouth daily. 90 tablet 3   melatonin 3 MG TABS tablet Take 3 mg by mouth at bedtime.     omeprazole (PRILOSEC) 20 MG capsule Take 20 mg by mouth daily.     pravastatin (PRAVACHOL) 40 MG tablet Take 1 tablet (40 mg total) by mouth daily. 90 tablet 2   spironolactone (ALDACTONE) 25 MG tablet Take 1 tablet (25 mg total) by mouth daily. 90 tablet 3   Tamsulosin HCl (FLOMAX) 0.4 MG CAPS Take 0.4 mg by mouth daily after supper.     torsemide (DEMADEX) 20 MG tablet Take 1 tablet (20 mg total) by mouth as needed. For weight gain of 3lb in 24 hours, 5lb in a week or swelling 90 tablet 3   No current facility-administered medications for this visit.   Allergies  Allergen Reactions   Lisinopril Cough   Social History   Socioeconomic History   Marital status: Single    Spouse name: Not on file   Number of children: Not on file   Years of education: Not on file   Highest education level: Not on file  Occupational History   Not on file  Tobacco Use   Smoking status: Former    Current packs/day: 0.00    Average packs/day: 2.0 packs/day for 14.0 years (28.0 ttl pk-yrs)    Types: Cigarettes    Start date: 02/15/1979    Quit date: 02/14/1993    Years since quitting: 29.7   Smokeless tobacco: Not on file  Substance and Sexual Activity   Alcohol use: Yes    Comment: occasionally, once a month   Drug use: Not on file   Sexual activity: Not on file  Other Topics Concern   Not on file  Social History Narrative   He is single and lives in [place].  He drives a refrigerator truck.   Social Determinants of Health   Financial Resource Strain: Low Risk  (11/01/2022)   Received from [facility]   Overall Financial Resource Strain (CARDIA)     Difficulty of Paying Living Expenses: Not hard at all  Food Insecurity: No Food Insecurity (11/01/2022)   Received from [facility]   Hunger Vital Sign    Worried About Running Out of Food in the Last Year: Never true    Ran Out of Food in the Last Year: Never true  Transportation Needs: No Transportation Needs (11/01/2022)  Received from [facility] - Transportation    Lack of Transportation (Medical): No    Lack of Transportation (Non-Medical): No  Physical Activity: Sufficiently Active (11/01/2022)   Received from [facility]   Exercise Vital Sign    Days of Exercise per Week: 3 days    Minutes of Exercise per Session: 150+ min  Stress: No Stress Concern Present (11/01/2022)   Received from [facility] of Occupational Health - Occupational Stress Questionnaire    Feeling of Stress : Not at all  Social Connections: Socially Integrated (11/01/2022)   Received from [facility]   Social Network    How would you rate your social network (family, work, friends)?: Good participation with social networks  Intimate Partner Violence: Not At Risk (11/01/2022)   Received from [facility]   HITS    Over the last 12 months how often did your partner physically hurt you?: 1    Over the last 12 months how often did your partner insult you or talk down to you?: 1    Over the last 12 months how often did your partner threaten you with physical harm?: 1    Over the last 12 months how often did your partner scream or curse at you?: 1    Family History  Problem Relation Age of Onset   Breast cancer Mother    Cancer Father    Heart attack Brother    Heart attack Brother 42   There were no vitals taken for this visit.  Wt Readings from Last 3 Encounters:  09/06/22 96.3 kg (212 lb 6.4 oz)  08/26/22 99.8 kg (220 lb)  08/23/22 99.1 kg (218 lb 6.4 oz)   PHYSICAL EXAM: General:  well, elderly appearing.  No respiratory difficulty. Walked into clinic HEENT:  normal Neck: supple. JVD ~7 cm. Carotids 2+ bilat; no bruits. No lymphadenopathy or thyromegaly appreciated. Cor: PMI nondisplaced. Regular rate & rhythm. No rubs, gallops or murmurs. Lungs: clear Abdomen: soft, nontender, nondistended. No hepatosplenomegaly. No bruits or masses. Good bowel sounds. Extremities: no cyanosis, clubbing, rash, edema  Neuro: alert & oriented x 3, cranial nerves grossly intact. moves all 4 extremities w/o difficulty. Affect pleasant.   ECG (personally reviewed): NSR 80s. QTC 551, RBBB  ReDs: 27%  ASSESSMENT & PLAN: 1. Chronic Systolic Heart Failure - NICM  - Initially diagnosed with HFrEF in 2012. EF previously improved to 50-55% on medications.  - He had been lost to follow up in the HF clinic at [facility] and had been followed at [facility]. Most recent - cath 2015 showed nonobstructive LCX OM 20%.   - Echo (2022): EF went back down 25-30% and has continued to drop.  - Echo (6/24): EF now < 20%. He does have single chamber Biotronik.  - LHC this admit w/ minimal CAD - Possible cardiomyopathy from high PVC burden.  - RHC c/w severe NICM w/ mildly elevated filling pressures and severely reduced CO/CI by thermo 4.3/1.9.  - Will need eventual cMRI, had iron infusion during admit. - Improved NYHA I-II today, volume stable, weight down 8lbs. REDs 27%.  - Decrease torsemide 40>20 mg daily. - Continue Jardiance 10 mg daily. - Continue digoxin 0.125 mg daily - Increase spironolactone 12.5>25 mg daily  - Increase losartan 12.5>25 mg daily, BMET today, repeat 1 week - Continue to try to suppress PVCs and maintain NSR. If EF not improving w/ in 4-6 weeks w/ suppression, will need to consider  LVAD. Doing well today.    2. A fib  - In NSR 04/2022. - Back in AF 5/24 during an admission - Started on amiodarone during most recent admission, chemically converted to NSR - NSR today, no PVCs - Decrease amiodarone 200 mg bid>200 mg daily. - Continue Eliquis 5 mg bid -  CBC today.  - consider sleep study down the road - Consider eventual EP referral for ablation  3. CKD Stage IIIa - Baseline SCr 1 - Continue SGLT2i - Labs today   4. Anemia, IDA - Given IV iron during admission.  - CBC today, no bleeding issues.   5. ?RV Lead vegetation - possible that this is due to the "curl-i-cue" that the lead forms in the right heart, as noted on CXR.  - BCx NG - No fevers - No vegetation on TEE 7/24   6. PVCs - High PVC burden. ~ 50% during admission - ECG today with no PVCs - Decrease amiodarone 200 mg daily - Consider sleep study next.   Heart failure meds refilled today. Note given to patient for work. Ok for him to return to work next Thursday with breaks as needed.   Follow up 2 months with APP.   Brynda Peon, AGACNP-BC  11/04/22

## 2022-11-07 ENCOUNTER — Other Ambulatory Visit (HOSPITAL_COMMUNITY): Payer: Self-pay

## 2022-11-07 ENCOUNTER — Telehealth (HOSPITAL_COMMUNITY): Payer: Self-pay

## 2022-11-07 ENCOUNTER — Encounter (HOSPITAL_COMMUNITY): Payer: Self-pay

## 2022-11-07 ENCOUNTER — Ambulatory Visit (HOSPITAL_COMMUNITY)
Admission: RE | Admit: 2022-11-07 | Discharge: 2022-11-07 | Disposition: A | Discharge status: Home / Self Care | Payer: Medicare PPO | Source: Ambulatory Visit | Attending: Family Medicine

## 2022-11-07 VITALS — BP 114/72 | HR 74 | Wt 219.6 lb

## 2022-11-07 DIAGNOSIS — Z7901 Long term (current) use of anticoagulants: Secondary | ICD-10-CM | POA: Diagnosis not present

## 2022-11-07 DIAGNOSIS — Z79899 Other long term (current) drug therapy: Secondary | ICD-10-CM | POA: Insufficient documentation

## 2022-11-07 DIAGNOSIS — I4819 Other persistent atrial fibrillation: Secondary | ICD-10-CM

## 2022-11-07 DIAGNOSIS — D509 Iron deficiency anemia, unspecified: Secondary | ICD-10-CM

## 2022-11-07 DIAGNOSIS — I5022 Chronic systolic (congestive) heart failure: Secondary | ICD-10-CM | POA: Insufficient documentation

## 2022-11-07 DIAGNOSIS — I428 Other cardiomyopathies: Secondary | ICD-10-CM | POA: Insufficient documentation

## 2022-11-07 DIAGNOSIS — Z7984 Long term (current) use of oral hypoglycemic drugs: Secondary | ICD-10-CM | POA: Insufficient documentation

## 2022-11-07 DIAGNOSIS — E785 Hyperlipidemia, unspecified: Secondary | ICD-10-CM | POA: Insufficient documentation

## 2022-11-07 DIAGNOSIS — I5020 Unspecified systolic (congestive) heart failure: Secondary | ICD-10-CM

## 2022-11-07 DIAGNOSIS — I130 Hypertensive heart and chronic kidney disease with heart failure and stage 1 through stage 4 chronic kidney disease, or unspecified chronic kidney disease: Secondary | ICD-10-CM | POA: Diagnosis present

## 2022-11-07 DIAGNOSIS — I493 Ventricular premature depolarization: Secondary | ICD-10-CM | POA: Diagnosis not present

## 2022-11-07 DIAGNOSIS — I480 Paroxysmal atrial fibrillation: Secondary | ICD-10-CM | POA: Diagnosis not present

## 2022-11-07 DIAGNOSIS — I2510 Atherosclerotic heart disease of native coronary artery without angina pectoris: Secondary | ICD-10-CM | POA: Diagnosis not present

## 2022-11-07 DIAGNOSIS — N1831 Chronic kidney disease, stage 3a: Secondary | ICD-10-CM | POA: Diagnosis not present

## 2022-11-07 LAB — COMPREHENSIVE METABOLIC PANEL
ALT: 14 U/L (ref 0–44)
AST: 17 U/L (ref 15–41)
Albumin: 3.5 g/dL (ref 3.5–5.0)
Alkaline Phosphatase: 63 U/L (ref 38–126)
Anion gap: 7 (ref 5–15)
BUN: 13 mg/dL (ref 8–23)
CO2: 25 mmol/L (ref 22–32)
Calcium: 9 mg/dL (ref 8.9–10.3)
Chloride: 107 mmol/L (ref 98–111)
Creatinine, Ser: 1.2 mg/dL (ref 0.61–1.24)
GFR, Estimated: >60 mL/min (ref >60)
Glucose, Bld: 70 mg/dL (ref 70–99)
Potassium: 4 mmol/L (ref 3.5–5.1)
Sodium: 139 mmol/L (ref 135–145)
Total Bilirubin: 1.8 mg/dL — ABNORMAL HIGH (ref 0.3–1.2)
Total Protein: 6.6 g/dL (ref 6.5–8.1)

## 2022-11-07 LAB — DIGOXIN LEVEL: Digoxin Level: 0.2 ng/mL — ABNORMAL LOW (ref 0.8–2.0)

## 2022-11-07 LAB — BRAIN NATRIURETIC PEPTIDE: B Natriuretic Peptide: 1211.3 pg/mL — ABNORMAL HIGH (ref 0.0–100.0)

## 2022-11-07 LAB — TSH: TSH: 1.101 u[IU]/mL (ref 0.350–4.500)

## 2022-11-07 MED ORDER — AMIODARONE HCL 100 MG PO TABS: 100.0000 mg | ORAL_TABLET | Freq: Every day | ORAL | 3 refills | Status: DC

## 2022-11-07 MED ORDER — POTASSIUM CHLORIDE ER 10 MEQ PO TBCR: 10.0000 meq | EXTENDED_RELEASE_TABLET | ORAL | 3 refills | Status: DC

## 2022-11-07 MED ORDER — TORSEMIDE 20 MG PO TABS: 20.0000 mg | ORAL_TABLET | ORAL | 3 refills | Status: DC

## 2022-11-07 NOTE — Progress Notes (Signed)
Medication Samples have been provided to the patient.  Drug name: Jardiance        Strength: 10mg          Qty: 4  LOT: [id number]  Exp.Date: 3/26  Dosing instructions: take one tablet daily   The patient has been instructed regarding the correct time, dose, and frequency of taking this medication, including desired effects and most common side effects.   Medication Samples have been provided to the patient.  Drug name: Eliquis        Strength: 5mg         Qty: 4 boxes  LOT: [id number]  Exp.Date: 10/25  Dosing instructions: take 1 tablet twice daily   The patient has been instructed regarding the correct time, dose, and frequency of taking this medication, including desired effects and most common side effects.   Jodene Polyak B Kamica Florance 2:53 PM 11/07/2022

## 2022-11-07 NOTE — Telephone Encounter (Signed)
Called patients EP provider (Dr. Nicanor Bake) at [phone] to see if patients Biotronick ICD is MRI compatible as patient needs cardiac MRI per Prince Rome, NP.   Unable to reach anyone- left message with call back number.

## 2022-11-07 NOTE — Patient Instructions (Addendum)
Medication Changes:  [facility] REGARD[facility]ICATIONS   SAMPLES GIVEN TODAY OF JARDIANCE AND ELIQUIS  TAKE TORSEMIDE 20MG  EVERY  MONDAY, WEDNESDAY, AND FRIDAY   TAKE POTASSIUM EVERY MONDAY, WEDNESDAY, AND FRIDAY   DECREASE AMIODARONE TO 100MG  ONCE DAILY   Lab Work:  Labs done today, your results will be available in MyChart, we will contact you for abnormal readings.  THEN RETURN IN 10-14 DAYS FOR LAB WORK AS SCHEDULED   Testing/Procedures:  SOMEONE WILL CALL YOU REGARDING YOUR HOME SLEEP STUDY TO ARRANGE THIS   Your physician has requested that you have a cardiac MRI. Cardiac MRI uses a computer to create images of your heart as its beating, producing both still and moving pictures of your heart and major blood vessels. For further information please visit [email or web address]. Please follow the instruction sheet given to you today for more information.   [facility] [address] [place], [place] [address] [phone] Please take advantage of the free valet parking available at the [facility] and [employer] (Entrance C).  Proceed to the [facility] Radiology Department (First Floor) for check-in.   Magnetic resonance imaging (MRI) is a painless test that produces images of the inside of the body without using Xrays.  During an MRI, strong magnets and radio waves work together in a Data processing manager to form detailed images.   MRI images may provide more details about a medical condition than X-rays, CT scans, and ultrasounds can provide.  You may be given earphones to listen for instructions.  You may eat a light breakfast and take medications as ordered with the exception of furosemide, hydrochlorothiazide, or spironolactone(fluid pill, other). Please avoid stimulants for 12 hr prior to test. (Ie. Caffeine, nicotine, chocolate, or antihistamine medications)  An IV will be inserted into one of your veins. Contrast material will be injected  into your IV. It will leave your body through your urine within a day. You may be told to drink plenty of fluids to help flush the contrast material out of your system.  You will be asked to remove all metal, including: Watch, jewelry, and other metal objects including hearing aids, hair pieces and dentures. Also wearable glucose monitoring systems (ie. Freestyle Libre and Omnipods) (Braces and fillings normally are not a problem.)   TEST WILL TAKE APPROXIMATELY 1 HOUR  PLEASE NOTIFY SCHEDULING AT LEAST 24 HOURS IN ADVANCE IF YOU ARE UNABLE TO KEEP YOUR APPOINTMENT. [phone]  For more information and frequently asked questions, please visit our website : [email or web address]  Please call the Cardiac Imaging Nurse Navigators with any questions/concerns. [phone] Office   Follow-Up in: 3 MONTHS WITH Dr. [name] as scheduled   At the [facility], you and your health needs are our priority. We have a designated team specialized in the treatment of Heart Failure. This Care Team includes your primary Heart Failure Specialized Cardiologist (physician), Advanced Practice Providers (APPs- Physician Assistants and Nurse Practitioners), and Pharmacist who all work together to provide you with the care you need, when you need it.   You may see any of the following providers on your designated Care Team at your next follow up:  Dr. Arvilla Meres Dr. Marca Ancona Dr. Marcos Eke, NP Robbie Lis, [place] [facility] [place], [place] Brynda Peon, NP Karle Plumber, PharmD   Please be sure to bring in all your medications bottles to every appointment.   Need to Contact us:  If you have any  questions or concerns before your next appointment please send [place] a message through [employer] or call our office at [phone].    TO LEAVE A MESSAGE FOR THE NURSE SELECT OPTION 2, PLEASE LEAVE A MESSAGE INCLUDING: YOUR NAME DATE OF BIRTH CALL  BACK NUMBER REASON FOR CALL**this is important as we prioritize the call backs  YOU WILL RECEIVE A CALL BACK THE SAME DAY AS LONG AS YOU CALL BEFORE 4:00 PM

## 2022-11-07 NOTE — Telephone Encounter (Signed)
Per Callie Fielding  His device is MRI conditional and he can be scanned-- we would just arrange for REP and RN to be present once scheduled.    CMR orderedShanda Bumps NP aware.

## 2022-11-21 ENCOUNTER — Ambulatory Visit (HOSPITAL_COMMUNITY)
Admission: RE | Admit: 2022-11-21 | Discharge: 2022-11-21 | Disposition: A | Discharge status: Home / Self Care | Payer: Medicare PPO | Source: Ambulatory Visit | Attending: Cardiology

## 2022-11-21 DIAGNOSIS — I5020 Unspecified systolic (congestive) heart failure: Secondary | ICD-10-CM | POA: Insufficient documentation

## 2022-11-21 LAB — BASIC METABOLIC PANEL
Anion gap: 10 (ref 5–15)
BUN: 13 mg/dL (ref 8–23)
CO2: 23 mmol/L (ref 22–32)
Calcium: 8.9 mg/dL (ref 8.9–10.3)
Chloride: 108 mmol/L (ref 98–111)
Creatinine, Ser: 1.42 mg/dL — ABNORMAL HIGH (ref 0.61–1.24)
GFR, Estimated: 53 mL/min — ABNORMAL LOW (ref >60)
Glucose, Bld: 116 mg/dL — ABNORMAL HIGH (ref 70–99)
Potassium: 3.8 mmol/L (ref 3.5–5.1)
Sodium: 141 mmol/L (ref 135–145)

## 2022-11-21 LAB — BRAIN NATRIURETIC PEPTIDE: B Natriuretic Peptide: 731.6 pg/mL — ABNORMAL HIGH (ref 0.0–100.0)

## 2022-11-23 ENCOUNTER — Telehealth (HOSPITAL_COMMUNITY): Payer: Self-pay | Admitting: Cardiology

## 2022-11-23 DIAGNOSIS — I5020 Unspecified systolic (congestive) heart failure: Secondary | ICD-10-CM

## 2022-11-23 NOTE — Telephone Encounter (Signed)
-----   Message from Anderson Malta Gravity sent at 11/21/2022  5:01 PM EDT ----- Repeat BMET in 2 weeks to ensure renal function remains stable

## 2022-11-23 NOTE — Telephone Encounter (Signed)
Patient called.  Patient aware.  

## 2022-11-24 ENCOUNTER — Telehealth (HOSPITAL_COMMUNITY): Payer: Self-pay

## 2022-11-24 NOTE — Telephone Encounter (Addendum)
  [facility]   Pre-operative Risk Assessment   Request for Surgical Clearance     Procedure:   Right Elbow/arm mass excisional biopsy   Date of Surgery:  Clearance 12/15/22                               Surgeon:  Dr. Robb Matar  Surgeon's Group or Practice Name:  [facility]  Phone number:  [phone] Fax number:  [phone]  Type of Clearance Requested:   - Medical  - Pharmacy:  Hold Apixaban (Eliquis) how long is it okay to hold? { Type of Anesthesia:   LEFT MESSAGE FOR OFFICE TO CALL BACK REGARDING TYPE OF ANESTHESIA   Additional requests/questions:  Please fax a copy of CLEARANCE AND RECOMMENDATIONS to the surgeon's office.  Signed, Cleotilde Neer Keron Koffman   11/24/2022, 2:21 PM   [facility] Dr. Ad[facility] [address] [facility] [place] [address] [phone] (office) [phone] (fax)

## 2022-11-24 NOTE — Telephone Encounter (Signed)
Left message with [facility] regarding what type of anesthesia is to be used for procedure.   Once that info is received will forward to provider for review.

## 2022-12-05 ENCOUNTER — Other Ambulatory Visit (HOSPITAL_COMMUNITY): Payer: Medicare PPO

## 2022-12-12 ENCOUNTER — Ambulatory Visit (HOSPITAL_COMMUNITY)
Admission: RE | Admit: 2022-12-12 | Discharge: 2022-12-12 | Disposition: A | Discharge status: Home / Self Care | Payer: Medicare PPO | Source: Ambulatory Visit | Attending: Family Medicine | Admitting: Family Medicine

## 2022-12-12 DIAGNOSIS — I5020 Unspecified systolic (congestive) heart failure: Secondary | ICD-10-CM | POA: Diagnosis present

## 2022-12-12 LAB — BASIC METABOLIC PANEL
Anion gap: 8 (ref 5–15)
BUN: 13 mg/dL (ref 8–23)
CO2: 22 mmol/L (ref 22–32)
Calcium: 8.4 mg/dL — ABNORMAL LOW (ref 8.9–10.3)
Chloride: 109 mmol/L (ref 98–111)
Creatinine, Ser: 1.33 mg/dL — ABNORMAL HIGH (ref 0.61–1.24)
GFR, Estimated: 58 mL/min — ABNORMAL LOW (ref >60)
Glucose, Bld: 168 mg/dL — ABNORMAL HIGH (ref 70–99)
Potassium: 4.4 mmol/L (ref 3.5–5.1)
Sodium: 139 mmol/L (ref 135–145)

## 2022-12-13 HISTORY — PX: OTHER SURGICAL HISTORY: SHX169

## 2023-01-05 ENCOUNTER — Inpatient Hospital Stay (HOSPITAL_COMMUNITY)
Admission: EM | Admit: 2023-01-05 | Discharge: 2023-01-14 | DRG: 276 | Disposition: A | Discharge status: Home / Self Care | Payer: Medicare PPO | Attending: Internal Medicine | Admitting: Internal Medicine

## 2023-01-05 ENCOUNTER — Encounter (HOSPITAL_COMMUNITY): Payer: Self-pay | Admitting: Emergency Medicine

## 2023-01-05 ENCOUNTER — Other Ambulatory Visit: Payer: Self-pay

## 2023-01-05 ENCOUNTER — Inpatient Hospital Stay (HOSPITAL_COMMUNITY): Payer: Medicare PPO

## 2023-01-05 ENCOUNTER — Emergency Department (HOSPITAL_COMMUNITY): Payer: Medicare PPO

## 2023-01-05 DIAGNOSIS — E785 Hyperlipidemia, unspecified: Secondary | ICD-10-CM | POA: Diagnosis present

## 2023-01-05 DIAGNOSIS — Z6826 Body mass index (BMI) 26.0-26.9, adult: Secondary | ICD-10-CM

## 2023-01-05 DIAGNOSIS — I472 Ventricular tachycardia, unspecified: Secondary | ICD-10-CM | POA: Diagnosis not present

## 2023-01-05 DIAGNOSIS — F419 Anxiety disorder, unspecified: Secondary | ICD-10-CM | POA: Diagnosis present

## 2023-01-05 DIAGNOSIS — I2510 Atherosclerotic heart disease of native coronary artery without angina pectoris: Secondary | ICD-10-CM | POA: Diagnosis present

## 2023-01-05 DIAGNOSIS — R570 Cardiogenic shock: Secondary | ICD-10-CM | POA: Diagnosis not present

## 2023-01-05 DIAGNOSIS — I252 Old myocardial infarction: Secondary | ICD-10-CM

## 2023-01-05 DIAGNOSIS — N1832 Chronic kidney disease, stage 3b: Secondary | ICD-10-CM

## 2023-01-05 DIAGNOSIS — Z66 Do not resuscitate: Secondary | ICD-10-CM | POA: Diagnosis not present

## 2023-01-05 DIAGNOSIS — I5084 End stage heart failure: Secondary | ICD-10-CM | POA: Diagnosis present

## 2023-01-05 DIAGNOSIS — J90 Pleural effusion, not elsewhere classified: Secondary | ICD-10-CM

## 2023-01-05 DIAGNOSIS — I700 Atherosclerosis of aorta: Secondary | ICD-10-CM | POA: Diagnosis present

## 2023-01-05 DIAGNOSIS — I480 Paroxysmal atrial fibrillation: Secondary | ICD-10-CM | POA: Diagnosis present

## 2023-01-05 DIAGNOSIS — E440 Moderate protein-calorie malnutrition: Secondary | ICD-10-CM | POA: Diagnosis present

## 2023-01-05 DIAGNOSIS — I441 Atrioventricular block, second degree: Secondary | ICD-10-CM | POA: Diagnosis not present

## 2023-01-05 DIAGNOSIS — Z95810 Presence of automatic (implantable) cardiac defibrillator: Secondary | ICD-10-CM

## 2023-01-05 DIAGNOSIS — Z809 Family history of malignant neoplasm, unspecified: Secondary | ICD-10-CM

## 2023-01-05 DIAGNOSIS — I428 Other cardiomyopathies: Secondary | ICD-10-CM | POA: Diagnosis present

## 2023-01-05 DIAGNOSIS — R109 Unspecified abdominal pain: Secondary | ICD-10-CM | POA: Diagnosis not present

## 2023-01-05 DIAGNOSIS — N1831 Chronic kidney disease, stage 3a: Secondary | ICD-10-CM | POA: Diagnosis present

## 2023-01-05 DIAGNOSIS — I130 Hypertensive heart and chronic kidney disease with heart failure and stage 1 through stage 4 chronic kidney disease, or unspecified chronic kidney disease: Secondary | ICD-10-CM | POA: Diagnosis present

## 2023-01-05 DIAGNOSIS — I4819 Other persistent atrial fibrillation: Secondary | ICD-10-CM | POA: Diagnosis not present

## 2023-01-05 DIAGNOSIS — D509 Iron deficiency anemia, unspecified: Secondary | ICD-10-CM | POA: Diagnosis present

## 2023-01-05 DIAGNOSIS — I5023 Acute on chronic systolic (congestive) heart failure: Secondary | ICD-10-CM | POA: Diagnosis present

## 2023-01-05 DIAGNOSIS — D631 Anemia in chronic kidney disease: Secondary | ICD-10-CM | POA: Diagnosis present

## 2023-01-05 DIAGNOSIS — N400 Enlarged prostate without lower urinary tract symptoms: Secondary | ICD-10-CM | POA: Diagnosis present

## 2023-01-05 DIAGNOSIS — Z7984 Long term (current) use of oral hypoglycemic drugs: Secondary | ICD-10-CM

## 2023-01-05 DIAGNOSIS — I442 Atrioventricular block, complete: Secondary | ICD-10-CM | POA: Diagnosis not present

## 2023-01-05 DIAGNOSIS — K219 Gastro-esophageal reflux disease without esophagitis: Secondary | ICD-10-CM | POA: Diagnosis present

## 2023-01-05 DIAGNOSIS — I10 Essential (primary) hypertension: Secondary | ICD-10-CM | POA: Diagnosis present

## 2023-01-05 DIAGNOSIS — E871 Hypo-osmolality and hyponatremia: Secondary | ICD-10-CM | POA: Diagnosis not present

## 2023-01-05 DIAGNOSIS — J439 Emphysema, unspecified: Secondary | ICD-10-CM | POA: Diagnosis present

## 2023-01-05 DIAGNOSIS — I493 Ventricular premature depolarization: Secondary | ICD-10-CM | POA: Diagnosis present

## 2023-01-05 DIAGNOSIS — Z1152 Encounter for screening for COVID-19: Secondary | ICD-10-CM

## 2023-01-05 DIAGNOSIS — Z515 Encounter for palliative care: Secondary | ICD-10-CM | POA: Diagnosis not present

## 2023-01-05 DIAGNOSIS — I509 Heart failure, unspecified: Principal | ICD-10-CM

## 2023-01-05 DIAGNOSIS — Z79899 Other long term (current) drug therapy: Secondary | ICD-10-CM

## 2023-01-05 DIAGNOSIS — Z7901 Long term (current) use of anticoagulants: Secondary | ICD-10-CM

## 2023-01-05 DIAGNOSIS — I447 Left bundle-branch block, unspecified: Secondary | ICD-10-CM | POA: Diagnosis not present

## 2023-01-05 DIAGNOSIS — I4891 Unspecified atrial fibrillation: Secondary | ICD-10-CM | POA: Diagnosis present

## 2023-01-05 DIAGNOSIS — I429 Cardiomyopathy, unspecified: Secondary | ICD-10-CM | POA: Diagnosis not present

## 2023-01-05 DIAGNOSIS — Z538 Procedure and treatment not carried out for other reasons: Secondary | ICD-10-CM | POA: Diagnosis not present

## 2023-01-05 DIAGNOSIS — Z87891 Personal history of nicotine dependence: Secondary | ICD-10-CM

## 2023-01-05 DIAGNOSIS — T82110A Breakdown (mechanical) of cardiac electrode, initial encounter: Secondary | ICD-10-CM | POA: Diagnosis not present

## 2023-01-05 DIAGNOSIS — R001 Bradycardia, unspecified: Secondary | ICD-10-CM | POA: Diagnosis not present

## 2023-01-05 DIAGNOSIS — Z8249 Family history of ischemic heart disease and other diseases of the circulatory system: Secondary | ICD-10-CM

## 2023-01-05 DIAGNOSIS — Z7189 Other specified counseling: Secondary | ICD-10-CM | POA: Diagnosis not present

## 2023-01-05 DIAGNOSIS — Z85828 Personal history of other malignant neoplasm of skin: Secondary | ICD-10-CM

## 2023-01-05 DIAGNOSIS — I5022 Chronic systolic (congestive) heart failure: Secondary | ICD-10-CM | POA: Diagnosis not present

## 2023-01-05 DIAGNOSIS — D649 Anemia, unspecified: Secondary | ICD-10-CM | POA: Diagnosis present

## 2023-01-05 DIAGNOSIS — K5900 Constipation, unspecified: Secondary | ICD-10-CM | POA: Diagnosis present

## 2023-01-05 DIAGNOSIS — Z888 Allergy status to other drugs, medicaments and biological substances status: Secondary | ICD-10-CM

## 2023-01-05 DIAGNOSIS — Z9889 Other specified postprocedural states: Secondary | ICD-10-CM

## 2023-01-05 DIAGNOSIS — I5021 Acute systolic (congestive) heart failure: Secondary | ICD-10-CM | POA: Diagnosis not present

## 2023-01-05 DIAGNOSIS — R451 Restlessness and agitation: Secondary | ICD-10-CM | POA: Diagnosis not present

## 2023-01-05 DIAGNOSIS — Z4502 Encounter for adjustment and management of automatic implantable cardiac defibrillator: Secondary | ICD-10-CM

## 2023-01-05 DIAGNOSIS — Z803 Family history of malignant neoplasm of breast: Secondary | ICD-10-CM

## 2023-01-05 HISTORY — DX: Personal history of nicotine dependence: Z87.891

## 2023-01-05 HISTORY — DX: Unspecified atrial fibrillation: I48.91

## 2023-01-05 HISTORY — DX: Unspecified systolic (congestive) heart failure: I50.20

## 2023-01-05 HISTORY — DX: Essential (primary) hypertension: I10

## 2023-01-05 HISTORY — DX: Atherosclerosis of aorta: I70.0

## 2023-01-05 HISTORY — DX: Chronic kidney disease, stage 3 unspecified: N18.30

## 2023-01-05 HISTORY — DX: Atherosclerotic heart disease of native coronary artery without angina pectoris: I25.10

## 2023-01-05 HISTORY — DX: Emphysema, unspecified: J43.9

## 2023-01-05 HISTORY — DX: Olecranon bursitis, unspecified elbow: M70.20

## 2023-01-05 LAB — RETICULOCYTES
Immature Retic Fract: 20.7 % — ABNORMAL HIGH (ref 2.3–15.9)
RBC.: 4.09 MIL/uL — ABNORMAL LOW (ref 4.22–5.81)
Retic Count, Absolute: 84.3 10*3/uL (ref 19.0–186.0)
Retic Ct Pct: 2.1 % (ref 0.4–3.1)

## 2023-01-05 LAB — LACTIC ACID, PLASMA
Lactic Acid, Venous: 1.6 mmol/L (ref 0.5–1.9)
Lactic Acid, Venous: 1.8 mmol/L (ref 0.5–1.9)

## 2023-01-05 LAB — URINALYSIS, W/ REFLEX TO CULTURE (INFECTION SUSPECTED)
Bacteria, UA: NONE SEEN
Bilirubin Urine: NEGATIVE
Glucose, UA: NEGATIVE mg/dL
Hgb urine dipstick: NEGATIVE
Ketones, ur: NEGATIVE mg/dL
Leukocytes,Ua: NEGATIVE
Nitrite: NEGATIVE
Protein, ur: NEGATIVE mg/dL
Specific Gravity, Urine: 1.016 (ref 1.005–1.030)
pH: 6 (ref 5.0–8.0)

## 2023-01-05 LAB — RESP PANEL BY RT-PCR (RSV, FLU A&B, COVID)  RVPGX2
Influenza A by PCR: NEGATIVE
Influenza B by PCR: NEGATIVE
Resp Syncytial Virus by PCR: NEGATIVE
SARS Coronavirus 2 by RT PCR: NEGATIVE

## 2023-01-05 LAB — COMPREHENSIVE METABOLIC PANEL
ALT: 11 U/L (ref 0–44)
AST: 18 U/L (ref 15–41)
Albumin: 3.6 g/dL (ref 3.5–5.0)
Alkaline Phosphatase: 58 U/L (ref 38–126)
Anion gap: 9 (ref 5–15)
BUN: 16 mg/dL (ref 8–23)
CO2: 21 mmol/L — ABNORMAL LOW (ref 22–32)
Calcium: 9.1 mg/dL (ref 8.9–10.3)
Chloride: 108 mmol/L (ref 98–111)
Creatinine, Ser: 1.23 mg/dL (ref 0.61–1.24)
GFR, Estimated: >60 mL/min (ref >60)
Glucose, Bld: 89 mg/dL (ref 70–99)
Potassium: 4 mmol/L (ref 3.5–5.1)
Sodium: 138 mmol/L (ref 135–145)
Total Bilirubin: 2.5 mg/dL — ABNORMAL HIGH (ref <1.2)
Total Protein: 6.2 g/dL — ABNORMAL LOW (ref 6.5–8.1)

## 2023-01-05 LAB — PROCALCITONIN: Procalcitonin: <0.1 ng/mL

## 2023-01-05 LAB — CBC
HCT: 31.1 % — ABNORMAL LOW (ref 39.0–52.0)
Hemoglobin: 9 g/dL — ABNORMAL LOW (ref 13.0–17.0)
MCH: 23.2 pg — ABNORMAL LOW (ref 26.0–34.0)
MCHC: 28.9 g/dL — ABNORMAL LOW (ref 30.0–36.0)
MCV: 80.2 fL (ref 80.0–100.0)
Platelets: 240 10*3/uL (ref 150–400)
RBC: 3.88 MIL/uL — ABNORMAL LOW (ref 4.22–5.81)
RDW: 16.3 % — ABNORMAL HIGH (ref 11.5–15.5)
WBC: 6.4 10*3/uL (ref 4.0–10.5)
nRBC: 0 % (ref 0.0–0.2)

## 2023-01-05 LAB — TROPONIN I (HIGH SENSITIVITY)
Troponin I (High Sensitivity): 40 ng/L — ABNORMAL HIGH (ref <18)
Troponin I (High Sensitivity): 33 ng/L — ABNORMAL HIGH (ref <18)

## 2023-01-05 LAB — LIPASE, BLOOD: Lipase: 28 U/L (ref 11–51)

## 2023-01-05 LAB — DIGOXIN LEVEL: Digoxin Level: <0.2 ng/mL — ABNORMAL LOW (ref 0.8–2.0)

## 2023-01-05 LAB — BRAIN NATRIURETIC PEPTIDE: B Natriuretic Peptide: 1126 pg/mL — ABNORMAL HIGH (ref 0.0–100.0)

## 2023-01-05 LAB — VITAMIN B12: Vitamin B-12: 173 pg/mL — ABNORMAL LOW (ref 180–914)

## 2023-01-05 LAB — IRON AND TIBC
Iron: 11 ug/dL — ABNORMAL LOW (ref 45–182)
Saturation Ratios: 3 % — ABNORMAL LOW (ref 17.9–39.5)
TIBC: 438 ug/dL (ref 250–450)
UIBC: 427 ug/dL

## 2023-01-05 LAB — MAGNESIUM: Magnesium: 2.2 mg/dL (ref 1.7–2.4)

## 2023-01-05 LAB — FERRITIN: Ferritin: 17 ng/mL — ABNORMAL LOW (ref 24–336)

## 2023-01-05 LAB — C-REACTIVE PROTEIN: CRP: <0.5 mg/dL (ref <1.0)

## 2023-01-05 LAB — FOLATE: Folate: 8.6 ng/mL (ref >5.9)

## 2023-01-05 MED ORDER — EMPAGLIFLOZIN 10 MG PO TABS
10.0000 mg | ORAL_TABLET | Freq: Every day | ORAL | Status: AC
  Administered 2023-01-05 – 2023-01-11 (×7): 10 mg via ORAL
  Filled 2023-01-05 (×7): qty 1

## 2023-01-05 MED ORDER — ONDANSETRON HCL 4 MG PO TABS: 4.0000 mg | ORAL_TABLET | Freq: Four times a day (QID) | ORAL | Status: DC | PRN

## 2023-01-05 MED ORDER — APIXABAN 5 MG PO TABS
5.0000 mg | ORAL_TABLET | Freq: Two times a day (BID) | ORAL | Status: DC
  Administered 2023-01-05 – 2023-01-07 (×4): 5 mg via ORAL
  Filled 2023-01-05 (×4): qty 1

## 2023-01-05 MED ORDER — ACETAMINOPHEN 325 MG PO TABS
650.0000 mg | ORAL_TABLET | Freq: Four times a day (QID) | ORAL | Status: DC | PRN
  Administered 2023-01-05 – 2023-01-13 (×5): 650 mg via ORAL
  Filled 2023-01-05 (×6): qty 2

## 2023-01-05 MED ORDER — LOSARTAN POTASSIUM 25 MG PO TABS
25.0000 mg | ORAL_TABLET | Freq: Every day | ORAL | Status: DC
  Administered 2023-01-05 – 2023-01-14 (×10): 25 mg via ORAL
  Filled 2023-01-05 (×10): qty 1

## 2023-01-05 MED ORDER — FUROSEMIDE 10 MG/ML IJ SOLN
40.0000 mg | Freq: Once | INTRAMUSCULAR | Status: AC
Start: 2023-01-05 — End: 2023-01-05
  Administered 2023-01-05: 40 mg via INTRAVENOUS
  Filled 2023-01-05: qty 4

## 2023-01-05 MED ORDER — DIGOXIN 125 MCG PO TABS
0.1250 mg | ORAL_TABLET | Freq: Every day | ORAL | Status: DC
  Administered 2023-01-05 – 2023-01-14 (×9): 0.125 mg via ORAL
  Filled 2023-01-05 (×10): qty 1

## 2023-01-05 MED ORDER — PRAVASTATIN SODIUM 40 MG PO TABS
40.0000 mg | ORAL_TABLET | Freq: Every day | ORAL | Status: DC
  Administered 2023-01-05 – 2023-01-14 (×10): 40 mg via ORAL
  Filled 2023-01-05 (×10): qty 1

## 2023-01-05 MED ORDER — ACETAMINOPHEN 650 MG RE SUPP
650.0000 mg | Freq: Four times a day (QID) | RECTAL | Status: DC | PRN
Start: 2023-01-05 — End: 2023-01-14

## 2023-01-05 MED ORDER — FUROSEMIDE 10 MG/ML IJ SOLN
80.0000 mg | Freq: Once | INTRAMUSCULAR | Status: AC
  Administered 2023-01-05: 80 mg via INTRAVENOUS
  Filled 2023-01-05: qty 8

## 2023-01-05 MED ORDER — POTASSIUM CHLORIDE CRYS ER 20 MEQ PO TBCR
20.0000 meq | EXTENDED_RELEASE_TABLET | Freq: Once | ORAL | Status: AC
  Administered 2023-01-05: 20 meq via ORAL
  Filled 2023-01-05: qty 1

## 2023-01-05 MED ORDER — FENTANYL CITRATE PF 50 MCG/ML IJ SOSY
50.0000 ug | PREFILLED_SYRINGE | Freq: Once | INTRAMUSCULAR | Status: AC
  Administered 2023-01-05: 50 ug via INTRAVENOUS
  Filled 2023-01-05: qty 1

## 2023-01-05 MED ORDER — IOHEXOL 350 MG/ML SOLN
75.0000 mL | Freq: Once | INTRAVENOUS | Status: AC | PRN
  Administered 2023-01-05: 75 mL via INTRAVENOUS

## 2023-01-05 MED ORDER — POTASSIUM CHLORIDE CRYS ER 20 MEQ PO TBCR
40.0000 meq | EXTENDED_RELEASE_TABLET | Freq: Once | ORAL | Status: AC
  Administered 2023-01-05: 40 meq via ORAL
  Filled 2023-01-05: qty 2

## 2023-01-05 MED ORDER — FUROSEMIDE 10 MG/ML IJ SOLN
80.0000 mg | Freq: Two times a day (BID) | INTRAMUSCULAR | Status: DC
  Administered 2023-01-06 (×2): 80 mg via INTRAVENOUS
  Filled 2023-01-05 (×3): qty 8

## 2023-01-05 MED ORDER — SPIRONOLACTONE 25 MG PO TABS
25.0000 mg | ORAL_TABLET | Freq: Every day | ORAL | Status: DC
  Administered 2023-01-05 – 2023-01-14 (×10): 25 mg via ORAL
  Filled 2023-01-05 (×10): qty 1

## 2023-01-05 MED ORDER — AMIODARONE HCL 100 MG PO TABS
100.0000 mg | ORAL_TABLET | Freq: Every day | ORAL | Status: DC
  Administered 2023-01-05 – 2023-01-06 (×2): 100 mg via ORAL
  Filled 2023-01-05 (×2): qty 1

## 2023-01-05 MED ORDER — ONDANSETRON HCL 4 MG/2ML IJ SOLN: 4.0000 mg | Freq: Four times a day (QID) | INTRAMUSCULAR | Status: DC | PRN

## 2023-01-05 NOTE — Plan of Care (Signed)
  Problem: Education: Goal: Knowledge of General Education information will improve Description: Including pain rating scale, medication(s)/side effects and non-pharmacologic comfort measures Outcome: Progressing   Problem: Education: Goal: Ability to verbalize understanding of medication therapies will improve Outcome: Progressing   

## 2023-01-05 NOTE — ED Triage Notes (Signed)
Pt. Stated, Ive had chest pain, SOB , a little N/V, and last night I think I passed out, for a minute I didn't know were I was at. This started 3 days ago and my medicine has not helped.

## 2023-01-05 NOTE — H&P (Signed)
History and Physical    Austin [name] NUU:[id number] DOB: [birth year] DOA: 01/05/2023  PCP: [name], NP  Patient coming from: home  I have personally briefly reviewed patient's old medical records in [facility]  Chief Complaint:  sob/chest pain/n/v x 3 days   HPI: Austin [name] is a 67 y.o. male with medical history significant of  chronic HFrEF, HTN, HLD, nonobstructive CAD 2012, CKD Stage III, PAF, and single chamber Biotronik . BPH, who presents to ED with increase sob x 2-3 days. He notes associated orthopnea /doe  as well as sob at rest.  He notes no fever /chills but has note cough . He also noted epigastric pain and one episode of emesis.He also noted central chest discomfort as well.   ED Course:  Vitals: Afeb, bp 126/76, hr 70, rr 26  sat 100% on ra sat 97% Wbc 6.4, hgb 9 ( 11) rdw 16.3 Na 138, K 4, CL 108, cr 1.23, t-bili 2.5 Lipase 28 Bnp 11260 CE40,33 Dig <0.2 Resp panel neg EKG: Afib with v pacing CTAB/pelvis: MPRESSION: 1. Small to moderate-sized right pleural effusion and small left pleural effusion. Patchy consolidative opacity at the right lung base could represent atelectasis or infiltrate. 2. Mild circumferential bladder wall thickening may be due to chronic outlet obstruction or cystitis. Recommend correlation with urinalysis. 3. Prostatomegaly. 4. Additional ancillary findings, as described above. 5. Aortic Atherosclerosis (ICD10-I70.0).   UA:neg   Tx fenatnyl, KCL, lasix 40, lasix 80 mg  Review of Systems: As per HPI otherwise 10 point review of systems negative.   Past Medical History:  Diagnosis Date   Benign prostatic hypertrophy    Coronary artery disease    Hyperlipidemia    Nonischemic cardiomyopathy (HCC) 11/15/2010   cath 12/10/10 minimal nonobstructive coronary artery disease   Systolic heart failure 11/15/2010   echo 12/08/10 EF 20%, severe left ventricular enlargement, mild right ventricular enlargement     Past Surgical History:  Procedure Laterality Date   CARDIOVERSION N/A 08/26/2022   Procedure: CARDIOVERSION;  Surgeon: Dolores Patty, MD;  Location: [facility] INVASIVE CV LAB;  Service: Cardiovascular;  Laterality: N/A;   HAND SURGERY  1979   left hand machine trauma   RIGHT/LEFT HEART CATH AND CORONARY ANGIOGRAPHY N/A 08/10/2022   Procedure: RIGHT/LEFT HEART CATH AND CORONARY ANGIOGRAPHY;  Surgeon: Dolores Patty, MD;  Location: [facility] INVASIVE CV LAB;  Service: Cardiovascular;  Laterality: N/A;   TEE WITHOUT CARDIOVERSION N/A 08/26/2022   Procedure: TRANSESOPHAGEAL ECHOCARDIOGRAM;  Surgeon: Dolores Patty, MD;  Location: [facility] INVASIVE CV LAB;  Service: Cardiovascular;  Laterality: N/A;     reports that he quit smoking about 29 years ago. His smoking use included cigarettes. He started smoking about 43 years ago. He has a 28 pack-year smoking history. He does not have any smokeless tobacco history on file. He reports current alcohol use. No history on file for drug use.  Allergies  Allergen Reactions   Lisinopril Cough    Family History  Problem Relation Age of Onset   Breast cancer Mother    Cancer Father    Heart attack Brother    Heart attack Brother 50    Prior to Admission medications   Medication Sig Start Date End Date Taking? Authorizing Provider  acetaminophen (TYLENOL) 325 MG tablet Take 2 tablets (650 mg total) by mouth every 4 (four) hours as needed for headache or mild pain. 08/12/22   Celine Mans, MD  albuterol (VENTOLIN HFA) 108 (90 Base)  MCG/ACT inhaler Inhale 1-2 puffs into the lungs every 6 (six) hours as needed. 05/03/22 05/03/23  [provider]  amiodarone (PACERONE) 100 MG tablet Take 1 tablet (100 mg total) by mouth daily. 11/07/22   Milford, Anderson Malta, FNP  apixaban (ELIQUIS) 5 MG TABS tablet Take 5 mg by mouth 2 (two) times daily. 05/25/22   [provider]  cephALEXin (KEFLEX) 500 MG capsule Take 500 mg by mouth 4 (four) times daily.  12/14/22   [provider]  Cholecalciferol 50 MCG (2000 UT) TABS Take 1 tablet by mouth daily. 06/11/19   [provider]  digoxin (LANOXIN) 0.125 MG tablet Take 1 tablet (0.125 mg total) by mouth daily. 09/06/22   Alen Bleacher, NP  fenofibrate micronized (LOFIBRA) 67 MG capsule Take 67 mg by mouth every morning. 05/25/22   [provider]  ferrous sulfate 325 (65 FE) MG tablet Take 325 mg by mouth. 05/29/19   [provider]  finasteride (PROSCAR) 5 MG tablet Take 1 tablet by mouth daily. 05/08/19   [provider]  hydrocortisone 2.5 % cream Apply 1 Application topically as needed. 12/30/12   [provider]  JARDIANCE 10 MG TABS tablet Take 1 tablet (10 mg total) by mouth daily. 09/06/22   Alen Bleacher, NP  ketoconazole (NIZORAL) 2 % shampoo Apply 1 Application topically as needed. 12/30/12   [provider]  loratadine (CLARITIN) 10 MG tablet Take 10 mg by mouth as needed. 12/30/12   [provider]  losartan (COZAAR) 25 MG tablet Take 1 tablet (25 mg total) by mouth daily. 09/06/22   Alen Bleacher, NP  melatonin 3 MG TABS tablet Take 3 mg by mouth at bedtime. 02/03/22   [provider]  omeprazole (PRILOSEC) 20 MG capsule Take 20 mg by mouth daily.    [provider]  ondansetron (ZOFRAN) 4 MG tablet Take 4 mg by mouth daily as needed.    [provider]  potassium chloride (KLOR-CON) 10 MEQ tablet Take 1 tablet (10 mEq total) by mouth every Monday, Wednesday, and Friday. 11/07/22   Milford, Anderson Malta, FNP  pravastatin (PRAVACHOL) 40 MG tablet Take 1 tablet (40 mg total) by mouth daily. 01/24/11 01/05/23  Bensimhon, Bevelyn Buckles, MD  spironolactone (ALDACTONE) 25 MG tablet Take 1 tablet (25 mg total) by mouth daily. 09/06/22   Alen Bleacher, NP  Tamsulosin HCl (FLOMAX) 0.4 MG CAPS Take 0.4 mg by mouth daily after supper.    [provider]  torsemide (DEMADEX) 20 MG tablet Take 1 tablet (20 mg total)  by mouth every Monday, Wednesday, and Friday. 11/07/22   Jacklynn Ganong, FNP    Physical Exam: Vitals:   01/05/23 1145 01/05/23 1200 01/05/23 1330 01/05/23 1538  BP: 104/85 116/89  125/84  Pulse: (!) 54 77    Resp: 19 (!) 21  20  Temp:   98 F (36.7 C) 98.6 F (37 C)  TempSrc:    Oral  SpO2: 97% 98%  99%  Weight:    96.3 kg  Height:        Constitutional: NAD, calm, comfortable Vitals:   01/05/23 1145 01/05/23 1200 01/05/23 1330 01/05/23 1538  BP: 104/85 116/89  125/84  Pulse: (!) 54 77    Resp: 19 (!) 21  20  Temp:   98 F (36.7 C) 98.6 F (37 C)  TempSrc:    Oral  SpO2: 97% 98%  99%  Weight:    96.3 kg  Height:       Eyes: PERRL, lids and conjunctivae normal ENMT: Mucous membranes are moist. Posterior pharynx clear of any exudate or lesions.Normal dentition.  Neck: normal, supple, no masses, no thyromegaly Respiratory: clear to auscultation bilaterally, no wheezing, +crackles. Normal respiratory effort. No accessory muscle use.  Cardiovascular: Regular rate and rhythm, no murmurs / rubs / gallops. No extremity edema. 2+ pedal pulses.  Abdomen: no tenderness, no masses palpated. No hepatosplenomegaly. Bowel sounds positive.  Musculoskeletal: no clubbing / cyanosis. No joint deformity upper and lower extremities. Good ROM, no contractures. Normal muscle tone.  Skin: no rashes, lesions, ulcers. No induration Neurologic: CN 2-12 grossly intact. Sensation intact,  Strength 5/5 in all 4.  Psychiatric: Normal judgment and insight. Alert and oriented x 3. Normal mood.    Labs on Admission: I have personally reviewed following labs and imaging studies  CBC: Recent Labs  Lab 01/05/23 1021  WBC 6.4  HGB 9.0*  HCT 31.1*  MCV 80.2  PLT 240   Basic Metabolic Panel: Recent Labs  Lab 01/05/23 1021  NA 138  K 4.0  CL 108  CO2 21*  GLUCOSE 89  BUN 16  CREATININE 1.23  CALCIUM 9.1  MG 2.2   GFR: Estimated Creatinine Clearance: 69.6 mL/min (by C-G formula based  on SCr of 1.23 mg/dL). Liver Function Tests: Recent Labs  Lab 01/05/23 1021  AST 18  ALT 11  ALKPHOS 58  BILITOT 2.5*  PROT 6.2*  ALBUMIN 3.6   Recent Labs  Lab 01/05/23 1021  LIPASE 28   No results for input(s): "AMMONIA" in the last 168 hours. Coagulation Profile: No results for input(s): "INR", "PROTIME" in the last 168 hours. Cardiac Enzymes: No results for input(s): "CKTOTAL", "CKMB", "CKMBINDEX", "TROPONINI" in the last 168 hours. BNP (last 3 results) No results for input(s): "PROBNP" in the last 8760 hours. HbA1C: No results for input(s): "HGBA1C" in the last 72 hours. CBG: No results for input(s): "GLUCAP" in the last 168 hours. Lipid Profile: No results for input(s): "CHOL", "HDL", "LDLCALC", "TRIG", "CHOLHDL", "LDLDIRECT" in the last 72 hours. Thyroid Function Tests: No results for input(s): "TSH", "T4TOTAL", "FREET4", "T3FREE", "THYROIDAB" in the last 72 hours. Anemia Panel: No results for input(s): "VITAMINB12", "FOLATE", "FERRITIN", "TIBC", "IRON", "RETICCTPCT" in the last 72 hours. Urine analysis:    Component Value Date/Time   COLORURINE YELLOW 01/05/2023 1357   APPEARANCEUR CLEAR 01/05/2023 1357   LABSPEC 1.016 01/05/2023 1357   PHURINE 6.0 01/05/2023 1357   GLUCOSEU NEGATIVE 01/05/2023 1357   HGBUR NEGATIVE 01/05/2023 1357   BILIRUBINUR NEGATIVE 01/05/2023 1357   KETONESUR NEGATIVE 01/05/2023 1357   PROTEINUR NEGATIVE 01/05/2023 1357   NITRITE NEGATIVE 01/05/2023 1357   LEUKOCYTESUR NEGATIVE 01/05/2023 1357    Radiological Exams on Admission: CT ABDOMEN PELVIS W CONTRAST  Result Date: 01/05/2023 CLINICAL DATA:  Abdominal pain, acute, nonlocalized EXAM: CT ABDOMEN AND PELVIS WITH CONTRAST TECHNIQUE: Multidetector CT imaging of the abdomen and pelvis was performed using the standard protocol following bolus administration of intravenous contrast. RADIATION DOSE REDUCTION: This exam was performed according to the departmental dose-optimization  program which includes automated exposure control, adjustment of the mA and/or kV according to patient size and/or use of iterative reconstruction technique. CONTRAST:  75mL OMNIPAQUE IOHEXOL 350 MG/ML SOLN COMPARISON:  CT abdomen/pelvis dated August 07, 2022. FINDINGS: Lower chest: Small to moderate-sized right pleural effusion and small left pleural effusion with basilar atelectasis. Patchy consolidative opacity at the right lung base. Hepatobiliary: Stable 9 mm hypoattenuating focus  at the right hepatic dome, too small to definitively characterize. The previously described 1.3 cm focus of enhancement in the left hepatic lobe is not significantly changed and likely represents either a hemangioma or benign vascular abnormality. No gallstones, gallbladder wall thickening, or biliary dilatation. Pancreas: Unremarkable. No pancreatic ductal dilatation or surrounding inflammatory changes. Spleen: Normal in size. Unchanged 5 mm enhancing focus at the anterior inferior spleen (series 3, image 35), which could represent a small hemangioma or benign vascular lesion. The previously noted 2 additional foci of enhancement are not clearly visualized on this exam. Adrenals/Urinary Tract: The adrenal glands are unremarkable. The kidneys enhance symmetrically. No suspicious focal lesion. Punctate 2 mm hyperdense foci in the interpolar left and right kidneys could represent small nonobstructing stones or foci of vascular calcification. No hydronephrosis. Mild circumferential bladder wall thickening. Stomach/Bowel: Stomach is within normal limits. Appendix appears normal. No evidence of bowel wall thickening, distention, or inflammatory changes. Sigmoid colonic diverticulosis without evidence of acute diverticulitis. Moderate volume of stool throughout the colon. Vascular/Lymphatic: Aortic atherosclerosis. No enlarged abdominal or pelvic lymph nodes. Reproductive: The prostate gland is enlarged and indents the base of the bladder.  Other: Small fat containing umbilical hernia. No abdominopelvic ascites. No intraperitoneal free air. Musculoskeletal: No acute osseous abnormality. No suspicious osseous lesion. Degenerative disc disease at L5-S1. IMPRESSION: 1. Small to moderate-sized right pleural effusion and small left pleural effusion. Patchy consolidative opacity at the right lung base could represent atelectasis or infiltrate. 2. Mild circumferential bladder wall thickening may be due to chronic outlet obstruction or cystitis. Recommend correlation with urinalysis. 3. Prostatomegaly. 4. Additional ancillary findings, as described above. 5. Aortic Atherosclerosis (ICD10-I70.0). Electronically Signed   By: Hart Robinsons M.D.   On: 01/05/2023 14:14   DG Chest Port 1 View  Result Date: 01/05/2023 CLINICAL DATA:  Shortness of breath.  Chest pain. EXAM: PORTABLE CHEST 1 VIEW COMPARISON:  08/11/2022. FINDINGS: There is mild diffuse pulmonary vascular congestion and bilateral small layering pleural effusions. There are probable associated compressive atelectatic changes in the bilateral lungs. No acute consolidation or lung collapse. No pneumothorax. There is left apical pleural cap, which may represent layering pleural effusion as well. Stable mildly enlarged cardio-mediastinal silhouette, which is likely accentuated by AP technique. There is left-sided ICD. No acute osseous abnormalities. The soft tissues are within normal limits. IMPRESSION: *Findings described above favor congestive heart failure/pulmonary edema. Electronically Signed   By: Jules Schick M.D.   On: 01/05/2023 10:05    EKG: Independently reviewed. See above  Assessment/Plan  Acute on chronic CHFref  -lasix iv bid per cards recs -cycle ce, check tsh, echo in am  -resume SGLT2i, digoxin,spironolactone, losartan -f/u on cardiology recs   ? Lung infiltrate on Ctab/pelvis -dedicated CT thorax ordered r/o infection  -no fever no wbc , hold on abx currently    Afib  PVC -continue on amiodarone, Eliquis   CKDIIIa -at baseline   Anemia , IDA -drop in hgb from previous -check iron stores  -also check hemolysis lab as bili elevated with increase RDW   DVT prophylaxis: on OAC Code Status: full/ as discussed per patient wishes in event of cardiac arrest  Family Communication: none at bedside Disposition Plan: patient  expected to be admitted greater than 2 midnights  Consults called: Cardiology  Admission status: cardiac tele   Lurline Del MD [facility]  If 7PM-7AM, please contact night-coverage [email or web address] Password [facility]  01/05/2023, 4:26 PM

## 2023-01-05 NOTE — ED Notes (Signed)
Order for defibrillator interrogation. Pt has Biotronik. Rep to call back for ETA [phone].

## 2023-01-05 NOTE — ED Notes (Signed)
Was able to interrogate the defibrillator. Pt's #[phone]. Waiting for faxed report. [phone]

## 2023-01-05 NOTE — ED Notes (Signed)
ED TO INPATIENT HANDOFF REPORT  ED Nurse Name and Phone #: Vernona Rieger [phone]  O Name/Age/Gender Austin [name] 67 y.o. male Room/Bed: 001C/001C  Code Status   Code Status: Prior  Home/SNF/Other Home Patient oriented to: self, place, time, and situation Is this baseline? Yes   Triage Complete: Triage complete  Chief Complaint Acute CHF (congestive heart failure) (HCC) [I50.9]  Triage Note Pt. Stated, Austin [name] had chest pain, SOB , a little N/V, and last night I think I passed out, for a minute I didn't know were I was at. This started 3 days ago and my medicine has not helped.   Allergies Allergies  Allergen Reactions   Lisinopril Cough    Level of Care/Admitting Diagnosis ED Disposition     ED Disposition  Admit   Condition  --   Comment  Hospital Area: [facility] [[id number]]  Level of Care: Telemetry Cardiac [[id number]]  May admit patient to [facility] or [facility] if equivalent level of care is available:: No  Covid Evaluation: Symptomatic Person Under Investigation (PUI) or recent exposure (last 10 days) *Testing Required*  Diagnosis: Acute CHF (congestive heart failure) [facility]) [[id number]]  Admitting Physician: Lurline Del [[id number]]  Attending Physician: Lurline Del [[id number]]  Certification:: I certify this patient will need inpatient services for at least 2 midnights  Expected Medical Readiness: 01/10/2023          B Medical/Surgery History Past Medical History:  Diagnosis Date   Benign prostatic hypertrophy    Coronary artery disease    Hyperlipidemia    Nonischemic cardiomyopathy (HCC) 11/15/2010   cath 12/10/10 minimal nonobstructive coronary artery disease   Systolic heart failure 11/15/2010   echo 12/08/10 EF 20%, severe left ventricular enlargement, mild right ventricular enlargement   Past Surgical History:  Procedure Laterality Date   CARDIOVERSION N/A 08/26/2022   Procedure: CARDIOVERSION;  Surgeon: Dolores Patty,  MD;  Location: [facility] INVASIVE CV LAB;  Service: Cardiovascular;  Laterality: N/A;   HAND SURGERY  1979   left hand machine trauma   RIGHT/LEFT HEART CATH AND CORONARY ANGIOGRAPHY N/A 08/10/2022   Procedure: RIGHT/LEFT HEART CATH AND CORONARY ANGIOGRAPHY;  Surgeon: Dolores Patty, MD;  Location: [facility] INVASIVE CV LAB;  Service: Cardiovascular;  Laterality: N/A;   TEE WITHOUT CARDIOVERSION N/A 08/26/2022   Procedure: TRANSESOPHAGEAL ECHOCARDIOGRAM;  Surgeon: Dolores Patty, MD;  Location: [facility] INVASIVE CV LAB;  Service: Cardiovascular;  Laterality: N/A;     A IV Location/Drains/Wounds Patient Lines/Drains/Airways Status     Active Line/Drains/Airways     Name Placement date Placement time Site Days   Peripheral IV 01/05/23 20 G Right Antecubital 01/05/23  1015  Antecubital  less than 1            Intake/Output Last 24 hours No intake or output data in the 24 hours ending 01/05/23 1510  Labs/Imaging Results for orders placed or performed during the hospital encounter of 01/05/23 (from the past 48 hour(s))  CBC     Status: Abnormal   Collection Time: 01/05/23 10:21 AM  Result Value Ref Range   WBC 6.4 4.0 - 10.5 K/uL   RBC 3.88 (L) 4.22 - 5.81 MIL/uL   Hemoglobin 9.0 (L) 13.0 - 17.0 g/dL   HCT 24.4 (L) 01.0 - 27.2 %   MCV 80.2 80.0 - 100.0 fL   MCH 23.2 (L) 26.0 - 34.0 pg   MCHC 28.9 (L) 30.0 - 36.0 g/dL   RDW 53.6 (H) 64.4 - 03.4 %  Platelets 240 150 - 400 K/uL   nRBC 0.0 0.0 - 0.2 %    Comment: Performed at [facility], [address] [place], [place] [address]  Troponin I (High Sensitivity)     Status: Abnormal   Collection Time: 01/05/23 10:21 AM  Result Value Ref Range   Troponin I (High Sensitivity) 40 (H) <18 ng/L    Comment: (NOTE) Elevated high sensitivity troponin I (hsTnI) values and significant  changes across serial measurements may suggest ACS but many other  chronic and acute conditions are known to elevate hsTnI results.  Refer to the "Links"  section for chest pain algorithms and additional  guidance. Performed at [facility], [address], [place], [place] [address]   Comprehensive metabolic panel     Status: Abnormal   Collection Time: 01/05/23 10:21 AM  Result Value Ref Range   Sodium 138 135 - 145 mmol/L   Potassium 4.0 3.5 - 5.1 mmol/L   Chloride 108 98 - 111 mmol/L   CO2 21 (L) 22 - 32 mmol/L   Glucose, Bld 89 70 - 99 mg/dL    Comment: Glucose reference range applies only to samples taken after fasting for at least 8 hours.   BUN 16 8 - 23 mg/dL   Creatinine, Ser 2.53 0.61 - 1.24 mg/dL   Calcium 9.1 8.9 - 66.4 mg/dL   Total Protein 6.2 (L) 6.5 - 8.1 g/dL   Albumin 3.6 3.5 - 5.0 g/dL   AST 18 15 - 41 U/L   ALT 11 0 - 44 U/L   Alkaline Phosphatase 58 38 - 126 U/L   Total Bilirubin 2.5 (H) <1.2 mg/dL   GFR, Estimated >40 >34 mL/min    Comment: (NOTE) Calculated using the CKD-EPI Creatinine Equation (2021)    Anion gap 9 5 - 15    Comment: Performed at [facility], [address] [place], [place] [address]  Lipase, blood     Status: None   Collection Time: 01/05/23 10:21 AM  Result Value Ref Range   Lipase 28 11 - 51 U/L    Comment: Performed at [facility], [address] [place], [place] [address]  Brain natriuretic peptide     Status: Abnormal   Collection Time: 01/05/23 10:21 AM  Result Value Ref Range   B Natriuretic Peptide 1,126.0 (H) 0.0 - 100.0 pg/mL    Comment: Performed at [facility], [address], [place], [place] [address]  Resp panel by RT-PCR (RSV, Flu A&B, Covid) Anterior Nasal Swab     Status: None   Collection Time: 01/05/23 10:21 AM   Specimen: Anterior Nasal Swab  Result Value Ref Range   SARS Coronavirus 2 by RT PCR NEGATIVE NEGATIVE   Influenza A by PCR NEGATIVE NEGATIVE   Influenza B by PCR NEGATIVE NEGATIVE    Comment: (NOTE) The Xpert Xpress SARS-CoV-2/FLU/RSV plus assay is intended as an aid in the diagnosis of influenza from Nasopharyngeal swab  specimens and should not be used as a sole basis for treatment. Nasal washings and aspirates are unacceptable for Xpert Xpress SARS-CoV-2/FLU/RSV testing.  Fact Sheet for Patients: BloggerCourse.com  Fact Sheet for Healthcare Providers: SeriousBroker.it  This test is not yet approved or cleared by the [place] FDA and has been authorized for detection and/or diagnosis of SARS-CoV-2 by FDA under an Emergency Use Authorization (EUA). This EUA will remain in effect (meaning this test can be used) for the duration of  the COVID-19 declaration under Section 564(b)(1) of the Act, 21 U.S.C. section 360bbb-3(b)(1), unless the authorization is terminated or revoked.     Resp Syncytial Virus by PCR NEGATIVE NEGATIVE    Comment: (NOTE) Fact Sheet for Patients: BloggerCourse.com  Fact Sheet for Healthcare Providers: SeriousBroker.it  This test is not yet approved or cleared by the [place] FDA and has been authorized for detection and/or diagnosis of SARS-CoV-2 by FDA under an Emergency Use Authorization (EUA). This EUA will remain in effect (meaning this test can be used) for the duration of the COVID-19 declaration under Section 564(b)(1) of the Act, 21 U.S.C. section 360bbb-3(b)(1), unless the authorization is terminated or revoked.  Performed at [facility], [address], [place], [place] [address]   Magnesium     Status: None   Collection Time: 01/05/23 10:21 AM  Result Value Ref Range   Magnesium 2.2 1.7 - 2.4 mg/dL    Comment: Performed at [facility], [address] [place], [place] [address]  Digoxin level     Status: Abnormal   Collection Time: 01/05/23 10:21 AM  Result Value Ref Range   Digoxin Level <0.2 (L) 0.8 - 2.0 ng/mL    Comment: RESULT CONFIRMED BY MANUAL DILUTION Performed at [facility], [address] [place], [place]  [address]   Troponin I (High Sensitivity)     Status: Abnormal   Collection Time: 01/05/23 11:57 AM  Result Value Ref Range   Troponin I (High Sensitivity) 33 (H) <18 ng/L    Comment: (NOTE) Elevated high sensitivity troponin I (hsTnI) values and significant  changes across serial measurements may suggest ACS but many other  chronic and acute conditions are known to elevate hsTnI results.  Refer to the "Links" section for chest pain algorithms and additional  guidance. Performed at [facility], [address], [place], [place] [address]   Urinalysis, w/ Reflex to Culture (Infection Suspected) -Urine, Clean Catch     Status: None   Collection Time: 01/05/23  1:57 PM  Result Value Ref Range   Specimen Source URINE, CLEAN CATCH    Color, Urine YELLOW YELLOW   APPearance CLEAR CLEAR   Specific Gravity, Urine 1.016 1.005 - 1.030   pH 6.0 5.0 - 8.0   Glucose, UA NEGATIVE NEGATIVE mg/dL   Hgb urine dipstick NEGATIVE NEGATIVE   Bilirubin Urine NEGATIVE NEGATIVE   Ketones, ur NEGATIVE NEGATIVE mg/dL   Protein, ur NEGATIVE NEGATIVE mg/dL   Nitrite NEGATIVE NEGATIVE   Leukocytes,Ua NEGATIVE NEGATIVE   RBC / HPF 0-5 0 - 5 RBC/hpf   WBC, UA 0-5 0 - 5 WBC/hpf    Comment:        Reflex urine culture not performed if WBC <=10, OR if Squamous epithelial cells >5. If Squamous epithelial cells >5 suggest recollection.    Bacteria, UA NONE SEEN NONE SEEN   Squamous Epithelial / HPF 0-5 0 - 5 /HPF   Mucus PRESENT     Comment: Performed at [facility], [address] [place], [place] [address]   CT ABDOMEN PELVIS W CONTRAST  Result Date: 01/05/2023 CLINICAL DATA:  Abdominal pain, acute, nonlocalized EXAM: CT ABDOMEN AND PELVIS WITH CONTRAST TECHNIQUE: Multidetector CT imaging of the abdomen and pelvis was performed using the standard protocol following bolus administration of intravenous contrast. RADIATION DOSE REDUCTION: This exam was performed according to the departmental  dose-optimization program which includes automated exposure control, adjustment of the mA and/or  kV according to patient size and/or use of iterative reconstruction technique. CONTRAST:  75mL OMNIPAQUE IOHEXOL 350 MG/ML SOLN COMPARISON:  CT abdomen/pelvis dated August 07, 2022. FINDINGS: Lower chest: Small to moderate-sized right pleural effusion and small left pleural effusion with basilar atelectasis. Patchy consolidative opacity at the right lung base. Hepatobiliary: Stable 9 mm hypoattenuating focus at the right hepatic dome, too small to definitively characterize. The previously described 1.3 cm focus of enhancement in the left hepatic lobe is not significantly changed and likely represents either a hemangioma or benign vascular abnormality. No gallstones, gallbladder wall thickening, or biliary dilatation. Pancreas: Unremarkable. No pancreatic ductal dilatation or surrounding inflammatory changes. Spleen: Normal in size. Unchanged 5 mm enhancing focus at the anterior inferior spleen (series 3, image 35), which could represent a small hemangioma or benign vascular lesion. The previously noted 2 additional foci of enhancement are not clearly visualized on this exam. Adrenals/Urinary Tract: The adrenal glands are unremarkable. The kidneys enhance symmetrically. No suspicious focal lesion. Punctate 2 mm hyperdense foci in the interpolar left and right kidneys could represent small nonobstructing stones or foci of vascular calcification. No hydronephrosis. Mild circumferential bladder wall thickening. Stomach/Bowel: Stomach is within normal limits. Appendix appears normal. No evidence of bowel wall thickening, distention, or inflammatory changes. Sigmoid colonic diverticulosis without evidence of acute diverticulitis. Moderate volume of stool throughout the colon. Vascular/Lymphatic: Aortic atherosclerosis. No enlarged abdominal or pelvic lymph nodes. Reproductive: The prostate gland is enlarged and indents the base  of the bladder. Other: Small fat containing umbilical hernia. No abdominopelvic ascites. No intraperitoneal free air. Musculoskeletal: No acute osseous abnormality. No suspicious osseous lesion. Degenerative disc disease at L5-S1. IMPRESSION: 1. Small to moderate-sized right pleural effusion and small left pleural effusion. Patchy consolidative opacity at the right lung base could represent atelectasis or infiltrate. 2. Mild circumferential bladder wall thickening may be due to chronic outlet obstruction or cystitis. Recommend correlation with urinalysis. 3. Prostatomegaly. 4. Additional ancillary findings, as described above. 5. Aortic Atherosclerosis (ICD10-I70.0). Electronically Signed   By: Hart Robinsons M.D.   On: 01/05/2023 14:14   DG Chest Port 1 View  Result Date: 01/05/2023 CLINICAL DATA:  Shortness of breath.  Chest pain. EXAM: PORTABLE CHEST 1 VIEW COMPARISON:  08/11/2022. FINDINGS: There is mild diffuse pulmonary vascular congestion and bilateral small layering pleural effusions. There are probable associated compressive atelectatic changes in the bilateral lungs. No acute consolidation or lung collapse. No pneumothorax. There is left apical pleural cap, which may represent layering pleural effusion as well. Stable mildly enlarged cardio-mediastinal silhouette, which is likely accentuated by AP technique. There is left-sided ICD. No acute osseous abnormalities. The soft tissues are within normal limits. IMPRESSION: *Findings described above favor congestive heart failure/pulmonary edema. Electronically Signed   By: Jules Schick M.D.   On: 01/05/2023 10:05    Pending Labs Unresulted Labs (From admission, onward)    None       Vitals/Pain Today's Vitals   01/05/23 1145 01/05/23 1200 01/05/23 1330 01/05/23 1346  BP: 104/85 116/89    Pulse: (!) 54 77    Resp: 19 (!) 21    Temp:   98 F (36.7 C)   TempSrc:      SpO2: 97% 98%    Weight:      Height:      PainSc:    0-No pain     Isolation Precautions No active isolations  Medications Medications  furosemide (LASIX) injection 80 mg (has no administration in time range)  potassium chloride SA (KLOR-CON M) CR tablet 40 mEq (has no administration in time range)  furosemide (LASIX) injection 40 mg (40 mg Intravenous Given 01/05/23 1312)  potassium chloride SA (KLOR-CON M) CR tablet 20 mEq (20 mEq Oral Given 01/05/23 1312)  fentaNYL (SUBLIMAZE) injection 50 mcg (50 mcg Intravenous Given 01/05/23 1311)  iohexol (OMNIPAQUE) 350 MG/ML injection 75 mL (75 mLs Intravenous Contrast Given 01/05/23 1233)    Mobility walks     Focused Assessments Cardiac Assessment Handoff:    Lab Results  Component Value Date   CKTOTAL 161 12/09/2010   CKMB 4.7 (H) 12/09/2010   TROPONINI <0.30 12/09/2010   No results found for: "DDIMER" Does the Patient currently have chest pain? No    R Recommendations: See Admitting Provider Note  Report given to:   Additional Notes: Using urinal appropriately

## 2023-01-05 NOTE — Consult Note (Signed)
Advanced Heart Failure Team Consult Note   Primary Physician: [name], NP PCP-Cardiologist:  None  Reason for Consultation: Acute on chronic systolic CHF  HPI:    Austin [name] is seen today for evaluation of acute on chronic systolic CHF at the request of Dr. Charm Barges with Emergency Medicine.   67 y.o. male with history of chronic HFrEF, HTN, HLD, CKD Stage III, PAF, and single chamber Biotronik ICD.   EF initially down 2012 to 20%. EF improved in 2013 to 50-55%.  Echo (2022): EF 25-30% RV normal.  Echo (03/2022): EF 15-20% RV normal.    Admitted to [facility] Jan, Feb, March, April, and May of 2024 with A/C HFrEF.    Admitted 6/24 with a/c HF and AF. Echo showed EF < 20%, RV normal.R/LHC showed minimal CAD, severe NICM EF 10%, mildly elevated filling pressures and severely reduced CI 1.9. Chemically converted from AF to NSR on amio. LVAD work up initiated. Plan to repeat echo in 4-6 weeks after GDMT and PVC suppression, with eye toward LVAD if EF not improved.   TEE 08/26/22 d/t concern for vegetation on ICD wire: LVEF < 20%, RV moderate HK, mild to moderate MR, moderate TR, no LAA thrombus, calcifications on ICD wire but no vegetation  He is scheduled for cMRI next month.  Presented to ED today with worsening dyspnea since 11/17. Reports he has been short of breath with exertion for about a month, but much worse the last few days. This is associated with nausea, decreased appetite and orthopnea. Has eaten just a peanut butter sandwich in the last few days. Reports emesis shortly after he takes medications or tries to eat. During a coughing fit last night he lost consciousness. When he woke up he had slid down the side of the couch.  In ED: Hgb 9.0, WBC 6.4, HS troponin 40>33, dig level < 0.2, Scr 1.23, CO2 21, K 4.0, BNP 1,126, UA WNL. CXR with pulmonary vascular congestion and bilateral pleural effusions. CT A/P: Small to mod R pleural effusion, small elft pleural  effusion, mild bladder wall thickening may be d/t chronic outlet obstruction or cystitis. ICD was interrogated. No VT or AF, 16% PVC burden, 28% V paced.  He was given 40 mg IV lasix. Advanced Heart Failure asked to see for recommendations regarding acute on chronic systolic CHF.  At baseline he is independent in ADLs. 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 Maintains his home. Had been feeling well for a few months until the last few weeks.  Has 1 beer a month, no tobacco X 30 years.   Home Medications Prior to Admission medications   Medication Sig Start Date End Date Taking? Authorizing Provider  acetaminophen (TYLENOL) 325 MG tablet Take 2 tablets (650 mg total) by mouth every 4 (four) hours as needed for headache or mild pain. 08/12/22   Celine Mans, MD  albuterol (VENTOLIN HFA) 108 (90 Base) MCG/ACT inhaler Inhale 1-2 puffs into the lungs every 6 (six) hours as needed. 05/03/22 05/03/23  [provider]  amiodarone (PACERONE) 100 MG tablet Take 1 tablet (100 mg total) by mouth daily. 11/07/22   Milford, Anderson Malta, FNP  apixaban (ELIQUIS) 5 MG TABS tablet Take 5 mg by mouth 2 (two) times daily. 05/25/22   [provider]  Cholecalciferol 50 MCG (2000 UT) TABS Take 1 tablet by mouth daily. 06/11/19   [provider]  digoxin (LANOXIN) 0.125 MG tablet Take 1 tablet (0.125 mg  total) by mouth daily. 09/06/22   Alen Bleacher, NP  fenofibrate micronized (LOFIBRA) 67 MG capsule Take 67 mg by mouth every morning. 05/25/22   [provider]  ferrous sulfate 325 (65 FE) MG tablet Take 325 mg by mouth. 05/29/19   [provider]  finasteride (PROSCAR) 5 MG tablet Take 1 tablet by mouth daily. 05/08/19   [provider]  hydrocortisone 2.5 % cream Apply 1 Application topically as needed. 12/30/12   [provider]  JARDIANCE 10 MG TABS tablet Take 1 tablet (10 mg total) by mouth daily. 09/06/22   Alen Bleacher, NP  ketoconazole (NIZORAL)  2 % shampoo Apply 1 Application topically as needed. 12/30/12   [provider]  loratadine (CLARITIN) 10 MG tablet Take 10 mg by mouth as needed. 12/30/12   [provider]  losartan (COZAAR) 25 MG tablet Take 1 tablet (25 mg total) by mouth daily. 09/06/22   Alen Bleacher, NP  melatonin 3 MG TABS tablet Take 3 mg by mouth at bedtime. 02/03/22   [provider]  omeprazole (PRILOSEC) 20 MG capsule Take 20 mg by mouth daily.    [provider]  potassium chloride (KLOR-CON) 10 MEQ tablet Take 1 tablet (10 mEq total) by mouth every Monday, Wednesday, and Friday. 11/07/22   Milford, Anderson Malta, FNP  pravastatin (PRAVACHOL) 40 MG tablet Take 1 tablet (40 mg total) by mouth daily. 01/24/11 08/23/22  Bensimhon, Bevelyn Buckles, MD  spironolactone (ALDACTONE) 25 MG tablet Take 1 tablet (25 mg total) by mouth daily. 09/06/22   Alen Bleacher, NP  Tamsulosin HCl (FLOMAX) 0.4 MG CAPS Take 0.4 mg by mouth daily after supper.    [provider]  torsemide (DEMADEX) 20 MG tablet Take 1 tablet (20 mg total) by mouth every Monday, Wednesday, and Friday. 11/07/22   Jacklynn Ganong, FNP    Past Medical History: Past Medical History:  Diagnosis Date   Benign prostatic hypertrophy    Coronary artery disease    Hyperlipidemia    Nonischemic cardiomyopathy (HCC) 11/15/2010   cath 12/10/10 minimal nonobstructive coronary artery disease   Systolic heart failure 11/15/2010   echo 12/08/10 EF 20%, severe left ventricular enlargement, mild right ventricular enlargement    Past Surgical History: Past Surgical History:  Procedure Laterality Date   CARDIOVERSION N/A 08/26/2022   Procedure: CARDIOVERSION;  Surgeon: Dolores Patty, MD;  Location: [facility] INVASIVE CV LAB;  Service: Cardiovascular;  Laterality: N/A;   HAND SURGERY  1979   left hand machine trauma   RIGHT/LEFT HEART CATH AND CORONARY ANGIOGRAPHY N/A 08/10/2022   Procedure: RIGHT/LEFT HEART CATH AND CORONARY ANGIOGRAPHY;   Surgeon: Dolores Patty, MD;  Location: [facility] INVASIVE CV LAB;  Service: Cardiovascular;  Laterality: N/A;   TEE WITHOUT CARDIOVERSION N/A 08/26/2022   Procedure: TRANSESOPHAGEAL ECHOCARDIOGRAM;  Surgeon: Dolores Patty, MD;  Location: [facility] INVASIVE CV LAB;  Service: Cardiovascular;  Laterality: N/A;    Family History: Family History  Problem Relation Age of Onset   Breast cancer Mother    Cancer Father    Heart attack Brother    Heart attack Brother 91    Social History: Social History   Socioeconomic History   Marital status: Single    Spouse name: Not on file   Number of children: Not on file   Years of education: Not on file   Highest education level: Not on file  Occupational History   Not on file  Tobacco Use  Smoking status: Former    Current packs/day: 0.00    Average packs/day: 2.0 packs/day for 14.0 years (28.0 ttl pk-yrs)    Types: Cigarettes    Start date: 02/15/1979    Quit date: 02/14/1993    Years since quitting: 29.9   Smokeless tobacco: Not on file  Substance and Sexual Activity   Alcohol use: Yes    Comment: occasionally, once a month   Drug use: Not on file   Sexual activity: Not on file  Other Topics Concern   Not on file  Social History Narrative   He is single and lives in [place].  He drives a refrigerator truck.   Social Determinants of Health   Financial Resource Strain: Low Risk  (11/01/2022)   Received [facility]   Overall Financial Resource Strain (CARDIA)    Difficulty of Paying Living Expenses: Not hard at all  Food Insecurity: No Food Insecurity (11/01/2022)   Received from [facility]   Hunger Vital Sign    Worried About Running Out of Food in the Last Year: Never true    Ran Out of Food in the Last Year: Never true  Transportation Needs: No Transportation Needs (11/01/2022)   Received from [facility] - Transportation    Lack of Transportation (Medical): No    Lack of Transportation (Non-Medical): No   Physical Activity: Sufficiently Active (11/01/2022)   Received from [facility]   Exercise Vital Sign    Days of Exercise per Week: 3 days    Minutes of Exercise per Session: 150+ min  Stress: No Stress Concern Present (11/01/2022)   Received from [facility] - Occupational Stress Questionnaire    Feeling of Stress : Not at all  Social Connections: Socially Integrated (11/01/2022)   Received from [facility]   Social Network    How would you rate your social network (family, work, friends)?: Good participation with social networks    Allergies:  Allergies  Allergen Reactions   Lisinopril Cough    Objective:    Vital Signs:   Temp:  [97.8 F (36.6 C)-98 F (36.7 C)] 98 F (36.7 C) (11/21 1330) Pulse Rate:  [54-81] 77 (11/21 1200) Resp:  [13-26] 21 (11/21 1200) BP: (104-126)/(72-91) 116/89 (11/21 1200) SpO2:  [97 %-100 %] 98 % (11/21 1200) Weight:  [99.8 kg] 99.8 kg (11/21 0915)    Weight change: Filed Weights   01/05/23 0915  Weight: 99.8 kg    Intake/Output:  No intake or output data in the 24 hours ending 01/05/23 1400    Physical Exam    General:  Appears uncomfortable. HEENT: normal Neck: supple. JVP . Carotids 2+ bilat; no bruits.  Cor: PMI nondisplaced. Regular rate & rhythm. No rubs, gallops or murmurs. Lungs: Bibasilar rales Abdomen: soft, nontender, nondistended.  Extremities: no cyanosis, clubbing, rash, edema Neuro: alert & oriented x3 . Affect anxious.  Telemetry   SR, PVCs in bigeminy pattern  EKG    V paced with underlying SR  Labs   Basic Metabolic Panel: Recent Labs  Lab 01/05/23 1021  NA 138  K 4.0  CL 108  CO2 21*  GLUCOSE 89  BUN 16  CREATININE 1.23  CALCIUM 9.1  MG 2.2    Liver Function Tests: Recent Labs  Lab 01/05/23 1021  AST 18  ALT 11  ALKPHOS 58  BILITOT 2.5*  PROT 6.2*  ALBUMIN 3.6   Recent Labs  Lab 01/05/23 1021  LIPASE 28   No results for  input(s): "AMMONIA" in the last 168 hours.  CBC: Recent Labs  Lab 01/05/23 1021  WBC 6.4  HGB 9.0*  HCT 31.1*  MCV 80.2  PLT 240    Cardiac Enzymes: No results for input(s): "CKTOTAL", "CKMB", "CKMBINDEX", "TROPONINI" in the last 168 hours.  BNP: BNP (last 3 results) Recent Labs    11/07/22 1429 11/21/22 0902 01/05/23 1021  BNP 1,211.3* 731.6* 1,126.0*    ProBNP (last 3 results) No results for input(s): "PROBNP" in the last 8760 hours.   CBG: No results for input(s): "GLUCAP" in the last 168 hours.  Coagulation Studies: No results for input(s): "LABPROT", "INR" in the last 72 hours.   Imaging   DG Chest Port 1 View  Result Date: 01/05/2023 CLINICAL DATA:  Shortness of breath.  Chest pain. EXAM: PORTABLE CHEST 1 VIEW COMPARISON:  08/11/2022. FINDINGS: There is mild diffuse pulmonary vascular congestion and bilateral small layering pleural effusions. There are probable associated compressive atelectatic changes in the bilateral lungs. No acute consolidation or lung collapse. No pneumothorax. There is left apical pleural cap, which may represent layering pleural effusion as well. Stable mildly enlarged cardio-mediastinal silhouette, which is likely accentuated by AP technique. There is left-sided ICD. No acute osseous abnormalities. The soft tissues are within normal limits. IMPRESSION: *Findings described above favor congestive heart failure/pulmonary edema. Electronically Signed   By: Jules Schick M.D.   On: 01/05/2023 10:05     Medications:     Current Medications:   Infusions:     Patient Profile   67 y.o. male with history of chronic systolic CHF s/p ICD, PAF, HTN, CKD III. Admitted with acute on chronic systolic CHF.  Assessment/Plan  1. Chronic Systolic Heart Failure - NICM  - Initially diagnosed with HFrEF in 2012. EF previously improved to 50-55% on medications.  - Echo (6/24): EF now < 20%. He does have single chamber Biotronik.  - R/LHC  (7/24) w/ minimal CAD, mildly elevated filling pressures and severely reduced CO/CI by thermo 4.3/1.9.  - Possible cardiomyopathy from high PVC burden.  - NYHA IV. Volume overloaded. Given 40 mg lasix IV already. Will give another 80 mg IV lasix now.  - Some concern for low-output with GI symptoms, could also be d/t volume overload. Appetite starting to improve with diuresis. Check lactic acid. - Continue Jardiance 10 mg daily. - Continue digoxin 0.125 mg daily. - Continue spironolactone 25 mg daily  - Continue losartan 25 mg daily. - Device interrogated today. No AF or VT. No shocks. 16% PVC burden. 28% V paced but seems to be using more on telemetry. - Worried that he is nearing end-stage. VAD workup had previously been started. He states he discussed VAD with his family and reports he would not want the device. Palliative home inotropes may be an option. Reports poor quality of life this year. Palliative care consulted to further discuss goals of care.   2. A fib  - In NSR 04/2022. - Back in AF 5/24 during an admission - SR today - Has been on amiodarone 100 mg daily - Continue Eliquis 5 mg BID   3. CKD Stage IIIa - Baseline SCr 1.2 - Continue SGLT2i   4. ?RV Lead vegetation - possible that this is due to the "curl-i-cue" that the lead forms in the right heart, as noted on CXR.  - No vegetation on TEE 7/24   5. PVCs - High PVC burden. ~ 50% during prior admission -  16% PVC burden on device check today - On amiodarone 100 mg daily    Length of Stay: 0  Shylie Polo N, PA-C  01/05/2023, 2:00 PM  Advanced Heart Failure Team Pager [phone] (M-F; 7a - 5p)  Please contact [facility] for night-coverage after hours (4p -7a ) and weekends on [email or web address]

## 2023-01-05 NOTE — ED Provider Notes (Signed)
[place] EMERGENCY DEPARTMENT AT [facility] Provider Note   CSN: [id number] Arrival date & time: 01/05/23  4696     History  Chief Complaint  Patient presents with   Chest Pain   Shortness of Breath   Nausea   Emesis   Near Syncope    Austin [name] is a 67 y.o. male.  He has a history of HFrEF, nonobstructive CAD, paroxysmal A-fib and follows with Dr. Jackelyn Hoehn cardiology.  He said over the last few days he has had more shortness of breath especially at night.  Minimal cough with no production.  Feels hot but has not had a fever.  Has generalized abdominal pain and does not feel like eating.  Has had 1 episode of vomiting.  Thinks he might of passed out once.  Has generalized chest pain  The history is provided by the patient.  Shortness of Breath Severity:  Moderate Onset quality:  Gradual Duration:  3 days Timing:  Constant Progression:  Worsening Chronicity:  Recurrent Relieved by:  Nothing Exacerbated by: laying down. Ineffective treatments:  Rest and sitting up Associated symptoms: abdominal pain, chest pain, cough and vomiting   Associated symptoms: no fever, no sore throat and no sputum production   Emesis Associated symptoms: abdominal pain and cough   Associated symptoms: no fever and no sore throat   Near Syncope Associated symptoms include chest pain, abdominal pain and shortness of breath.       Home Medications Prior to Admission medications   Medication Sig Start Date End Date Taking? Authorizing Provider  acetaminophen (TYLENOL) 325 MG tablet Take 2 tablets (650 mg total) by mouth every 4 (four) hours as needed for headache or mild pain. 08/12/22   Celine Mans, MD  albuterol (VENTOLIN HFA) 108 (90 Base) MCG/ACT inhaler Inhale 1-2 puffs into the lungs every 6 (six) hours as needed. 05/03/22 05/03/23  [provider]  amiodarone (PACERONE) 100 MG tablet Take 1 tablet (100 mg total) by mouth daily. 11/07/22   Milford, Anderson Malta, FNP  apixaban (ELIQUIS) 5 MG TABS tablet Take 5 mg by mouth 2 (two) times daily. 05/25/22   [provider]  Cholecalciferol 50 MCG (2000 UT) TABS Take 1 tablet by mouth daily. 06/11/19   [provider]  digoxin (LANOXIN) 0.125 MG tablet Take 1 tablet (0.125 mg total) by mouth daily. 09/06/22   Alen Bleacher, NP  fenofibrate micronized (LOFIBRA) 67 MG capsule Take 67 mg by mouth every morning. 05/25/22   [provider]  ferrous sulfate 325 (65 FE) MG tablet Take 325 mg by mouth. 05/29/19   [provider]  finasteride (PROSCAR) 5 MG tablet Take 1 tablet by mouth daily. 05/08/19   [provider]  hydrocortisone 2.5 % cream Apply 1 Application topically as needed. 12/30/12   [provider]  JARDIANCE 10 MG TABS tablet Take 1 tablet (10 mg total) by mouth daily. 09/06/22   Alen Bleacher, NP  ketoconazole (NIZORAL) 2 % shampoo Apply 1 Application topically as needed. 12/30/12   [provider]  loratadine (CLARITIN) 10 MG tablet Take 10 mg by mouth as needed. 12/30/12   [provider]  losartan (COZAAR) 25 MG tablet Take 1 tablet (25 mg total) by mouth daily. 09/06/22   Alen Bleacher, NP  melatonin 3 MG TABS tablet Take 3 mg by mouth at bedtime. 02/03/22   [provider]  omeprazole (PRILOSEC) 20 MG capsule Take 20 mg by mouth  daily.    [provider]  potassium chloride (KLOR-CON) 10 MEQ tablet Take 1 tablet (10 mEq total) by mouth every Monday, Wednesday, and Friday. 11/07/22   Milford, Anderson Malta, FNP  pravastatin (PRAVACHOL) 40 MG tablet Take 1 tablet (40 mg total) by mouth daily. 01/24/11 08/23/22  Bensimhon, Bevelyn Buckles, MD  spironolactone (ALDACTONE) 25 MG tablet Take 1 tablet (25 mg total) by mouth daily. 09/06/22   Alen Bleacher, NP  Tamsulosin HCl (FLOMAX) 0.4 MG CAPS Take 0.4 mg by mouth daily after supper.    [provider]  torsemide (DEMADEX) 20 MG tablet Take 1 tablet (20 mg total) by mouth every  Monday, Wednesday, and Friday. 11/07/22   Jacklynn Ganong, FNP      Allergies    Lisinopril    Review of Systems   Review of Systems  Constitutional:  Negative for fever.  HENT:  Negative for sore throat.   Eyes:  Negative for visual disturbance.  Respiratory:  Positive for cough and shortness of breath. Negative for sputum production.   Cardiovascular:  Positive for chest pain and near-syncope. Negative for leg swelling.  Gastrointestinal:  Positive for abdominal pain, nausea and vomiting.  Genitourinary:  Negative for dysuria.  Neurological:  Positive for syncope.    Physical Exam Updated Vital Signs BP 126/76   Pulse 70   Temp 97.8 F (36.6 C) (Axillary)   Resp (!) 26   Ht 6\' 4"  (1.93 m)   Wt 99.8 kg   SpO2 100%   BMI 26.78 kg/m  Physical Exam Vitals and nursing note reviewed.  Constitutional:      General: He is not in acute distress.    Appearance: He is well-developed.  HENT:     Head: Normocephalic and atraumatic.  Eyes:     Conjunctiva/sclera: Conjunctivae normal.  Cardiovascular:     Rate and Rhythm: Normal rate. Rhythm irregular.     Heart sounds: Normal heart sounds. No murmur heard. Pulmonary:     Effort: Tachypnea and accessory muscle usage present. No respiratory distress.     Breath sounds: Rhonchi present.  Abdominal:     Palpations: Abdomen is soft.     Tenderness: There is abdominal tenderness (mild diffuse). There is no guarding or rebound.  Musculoskeletal:        General: No tenderness.     Cervical back: Neck supple.     Right lower leg: No edema.     Left lower leg: No edema.  Skin:    General: Skin is warm and dry.     Capillary Refill: Capillary refill takes less than 2 seconds.  Neurological:     General: No focal deficit present.     Mental Status: He is alert.     ED Results / Procedures / Treatments   Labs (all labs ordered are listed, but only abnormal results are displayed) Labs Reviewed  CBC - Abnormal; Notable for  the following components:      Result Value   RBC 3.88 (*)    Hemoglobin 9.0 (*)    HCT 31.1 (*)    MCH 23.2 (*)    MCHC 28.9 (*)    RDW 16.3 (*)    All other components within normal limits  COMPREHENSIVE METABOLIC PANEL - Abnormal; Notable for the following components:   CO2 21 (*)    Total Protein 6.2 (*)    Total Bilirubin 2.5 (*)    All other components within normal limits  BRAIN NATRIURETIC  PEPTIDE - Abnormal; Notable for the following components:   B Natriuretic Peptide 1,126.0 (*)    All other components within normal limits  DIGOXIN LEVEL - Abnormal; Notable for the following components:   Digoxin Level <0.2 (*)    All other components within normal limits  VITAMIN B12 - Abnormal; Notable for the following components:   Vitamin B-12 173 (*)    All other components within normal limits  IRON AND TIBC - Abnormal; Notable for the following components:   Iron 11 (*)    Saturation Ratios 3 (*)    All other components within normal limits  FERRITIN - Abnormal; Notable for the following components:   Ferritin 17 (*)    All other components within normal limits  RETICULOCYTES - Abnormal; Notable for the following components:   RBC. 4.09 (*)    Immature Retic Fract 20.7 (*)    All other components within normal limits  TROPONIN I (HIGH SENSITIVITY) - Abnormal; Notable for the following components:   Troponin I (High Sensitivity) 40 (*)    All other components within normal limits  TROPONIN I (HIGH SENSITIVITY) - Abnormal; Notable for the following components:   Troponin I (High Sensitivity) 33 (*)    All other components within normal limits  RESP PANEL BY RT-PCR (RSV, FLU A&B, COVID)  RVPGX2  LIPASE, BLOOD  MAGNESIUM  URINALYSIS, W/ REFLEX TO CULTURE (INFECTION SUSPECTED)  C-REACTIVE PROTEIN  PROCALCITONIN  FOLATE  LACTIC ACID, PLASMA  COMPREHENSIVE METABOLIC PANEL  CBC  LACTIC ACID, PLASMA  I-STAT CG4 LACTIC ACID, ED  I-STAT CG4 LACTIC ACID, ED    EKG EKG  Interpretation Date/Time:  Thursday January 05 2023 09:16:01 EST Ventricular Rate:  75 PR Interval:  248 QRS Duration:  190 QT Interval:  477 QTC Calculation: 533 R Axis:   -70  Text Interpretation: atrial fib with ventricular pacing Confirmed by Meridee Score [phone]) on 01/05/2023 9:18:11 AM  Radiology CT ABDOMEN PELVIS W CONTRAST  Result Date: 01/05/2023 CLINICAL DATA:  Abdominal pain, acute, nonlocalized EXAM: CT ABDOMEN AND PELVIS WITH CONTRAST TECHNIQUE: Multidetector CT imaging of the abdomen and pelvis was performed using the standard protocol following bolus administration of intravenous contrast. RADIATION DOSE REDUCTION: This exam was performed according to the departmental dose-optimization program which includes automated exposure control, adjustment of the mA and/or kV according to patient size and/or use of iterative reconstruction technique. CONTRAST:  75mL OMNIPAQUE IOHEXOL 350 MG/ML SOLN COMPARISON:  CT abdomen/pelvis dated August 07, 2022. FINDINGS: Lower chest: Small to moderate-sized right pleural effusion and small left pleural effusion with basilar atelectasis. Patchy consolidative opacity at the right lung base. Hepatobiliary: Stable 9 mm hypoattenuating focus at the right hepatic dome, too small to definitively characterize. The previously described 1.3 cm focus of enhancement in the left hepatic lobe is not significantly changed and likely represents either a hemangioma or benign vascular abnormality. No gallstones, gallbladder wall thickening, or biliary dilatation. Pancreas: Unremarkable. No pancreatic ductal dilatation or surrounding inflammatory changes. Spleen: Normal in size. Unchanged 5 mm enhancing focus at the anterior inferior spleen (series 3, image 35), which could represent a small hemangioma or benign vascular lesion. The previously noted 2 additional foci of enhancement are not clearly visualized on this exam. Adrenals/Urinary Tract: The adrenal glands are  unremarkable. The kidneys enhance symmetrically. No suspicious focal lesion. Punctate 2 mm hyperdense foci in the interpolar left and right kidneys could represent small nonobstructing stones or foci of vascular calcification. No hydronephrosis. Mild circumferential bladder wall  thickening. Stomach/Bowel: Stomach is within normal limits. Appendix appears normal. No evidence of bowel wall thickening, distention, or inflammatory changes. Sigmoid colonic diverticulosis without evidence of acute diverticulitis. Moderate volume of stool throughout the colon. Vascular/Lymphatic: Aortic atherosclerosis. No enlarged abdominal or pelvic lymph nodes. Reproductive: The prostate gland is enlarged and indents the base of the bladder. Other: Small fat containing umbilical hernia. No abdominopelvic ascites. No intraperitoneal free air. Musculoskeletal: No acute osseous abnormality. No suspicious osseous lesion. Degenerative disc disease at L5-S1. IMPRESSION: 1. Small to moderate-sized right pleural effusion and small left pleural effusion. Patchy consolidative opacity at the right lung base could represent atelectasis or infiltrate. 2. Mild circumferential bladder wall thickening may be due to chronic outlet obstruction or cystitis. Recommend correlation with urinalysis. 3. Prostatomegaly. 4. Additional ancillary findings, as described above. 5. Aortic Atherosclerosis (ICD10-I70.0). Electronically Signed   By: Hart Robinsons M.D.   On: 01/05/2023 14:14   DG Chest Port 1 View  Result Date: 01/05/2023 CLINICAL DATA:  Shortness of breath.  Chest pain. EXAM: PORTABLE CHEST 1 VIEW COMPARISON:  08/11/2022. FINDINGS: There is mild diffuse pulmonary vascular congestion and bilateral small layering pleural effusions. There are probable associated compressive atelectatic changes in the bilateral lungs. No acute consolidation or lung collapse. No pneumothorax. There is left apical pleural cap, which may represent layering pleural  effusion as well. Stable mildly enlarged cardio-mediastinal silhouette, which is likely accentuated by AP technique. There is left-sided ICD. No acute osseous abnormalities. The soft tissues are within normal limits. IMPRESSION: *Findings described above favor congestive heart failure/pulmonary edema. Electronically Signed   By: Jules Schick M.D.   On: 01/05/2023 10:05    Procedures Procedures    Medications Ordered in ED Medications  furosemide (LASIX) injection 80 mg (has no administration in time range)  acetaminophen (TYLENOL) tablet 650 mg (has no administration in time range)    Or  acetaminophen (TYLENOL) suppository 650 mg (has no administration in time range)  ondansetron (ZOFRAN) tablet 4 mg (has no administration in time range)    Or  ondansetron (ZOFRAN) injection 4 mg (has no administration in time range)  amiodarone (PACERONE) tablet 100 mg (has no administration in time range)  digoxin (LANOXIN) tablet 0.125 mg (has no administration in time range)  losartan (COZAAR) tablet 25 mg (has no administration in time range)  pravastatin (PRAVACHOL) tablet 40 mg (has no administration in time range)  spironolactone (ALDACTONE) tablet 25 mg (has no administration in time range)  empagliflozin (JARDIANCE) tablet 10 mg (has no administration in time range)  apixaban (ELIQUIS) tablet 5 mg (has no administration in time range)  furosemide (LASIX) injection 40 mg (40 mg Intravenous Given 01/05/23 1312)  potassium chloride SA (KLOR-CON M) CR tablet 20 mEq (20 mEq Oral Given 01/05/23 1312)  fentaNYL (SUBLIMAZE) injection 50 mcg (50 mcg Intravenous Given 01/05/23 1311)  iohexol (OMNIPAQUE) 350 MG/ML injection 75 mL (75 mLs Intravenous Contrast Given 01/05/23 1233)  furosemide (LASIX) injection 80 mg (80 mg Intravenous Given 01/05/23 1627)  potassium chloride SA (KLOR-CON M) CR tablet 40 mEq (40 mEq Oral Given 01/05/23 1627)    ED Course/ Medical Decision Making/ A&P Clinical Course as  of 01/05/23 2039  Thu Jan 05, 2023  1010 Chest x-ray interpreted by me as increased vascular congestion. [MB]  1211 Discussed with [facility] cardiology who will consult on the patient but recommends medicine admission if needs to be admitted. [MB]  1438 Patient being seen by heart failure team now.  AICD has been  interrogated and by my interpretation she did not have any events. [MB]  1453 Discussed with [facility] hospitalist service who will evaluate patient for admission [MB]    Clinical Course User Index [MB] Terrilee Files, MD                                 Medical Decision Making Amount and/or Complexity of Data Reviewed Labs: ordered. Radiology: ordered.  Risk Prescription drug management. Decision regarding hospitalization.   This patient complains of shortness of breath orthopnea abdominal pain; this involves an extensive number of treatment Options and is a complaint that carries with it a high risk of complications and morbidity. The differential includes CHF, COPD, pleural effusion, pericardial effusion, pneumonia, COVID, flu, intra-abdominal process, ACS  I ordered, reviewed and interpreted labs, which included CBC with normal white count, hemoglobin down from priors, chemistries unremarkable, BNP and troponins elevated need to be trended, COVID and flu negative, urinalysis without signs of infection I ordered medication IV pain medicine IV diuresis and reviewed PMP when indicated. I ordered imaging studies which included chest x-ray, CT abdomen and pelvis and I independently    visualized and interpreted imaging which showed moderate pleural effusion on right, bladder wall thickening Previous records obtained and reviewed in epic including recent cardiology notes I consulted heart failure team and [facility] hospitalist Dr. Maisie Fus and discussed lab and imaging findings and discussed disposition.  Cardiac monitoring reviewed, paced rhythm Social determinants considered, no  significant barriers Critical Interventions: None  After the interventions stated above, I reevaluated the patient and found patient to be breathing a little more comfortably with diuresis Admission and further testing considered, patient would benefit from mission to the hospital for further input from cardiology regarding med adjustments.         Final Clinical Impression(s) / ED Diagnoses Final diagnoses:  Acute on chronic congestive heart failure, unspecified heart failure type (HCC)  Pleural effusion on right    Rx / DC Orders ED Discharge Orders     None         Terrilee Files, MD 01/05/23 2043

## 2023-01-06 ENCOUNTER — Other Ambulatory Visit (HOSPITAL_COMMUNITY): Payer: Self-pay

## 2023-01-06 ENCOUNTER — Other Ambulatory Visit (HOSPITAL_COMMUNITY): Payer: Medicare PPO

## 2023-01-06 ENCOUNTER — Inpatient Hospital Stay (HOSPITAL_COMMUNITY): Payer: Medicare PPO

## 2023-01-06 DIAGNOSIS — R109 Unspecified abdominal pain: Secondary | ICD-10-CM | POA: Diagnosis not present

## 2023-01-06 DIAGNOSIS — K5900 Constipation, unspecified: Secondary | ICD-10-CM | POA: Diagnosis present

## 2023-01-06 DIAGNOSIS — E440 Moderate protein-calorie malnutrition: Secondary | ICD-10-CM

## 2023-01-06 DIAGNOSIS — I5022 Chronic systolic (congestive) heart failure: Secondary | ICD-10-CM | POA: Diagnosis not present

## 2023-01-06 DIAGNOSIS — I10 Essential (primary) hypertension: Secondary | ICD-10-CM

## 2023-01-06 DIAGNOSIS — I5084 End stage heart failure: Secondary | ICD-10-CM

## 2023-01-06 DIAGNOSIS — I5023 Acute on chronic systolic (congestive) heart failure: Secondary | ICD-10-CM | POA: Diagnosis not present

## 2023-01-06 DIAGNOSIS — I4819 Other persistent atrial fibrillation: Secondary | ICD-10-CM | POA: Diagnosis not present

## 2023-01-06 LAB — ECHOCARDIOGRAM COMPLETE
AR max vel: 2.07 cm2
AV Area VTI: 2.68 cm2
AV Area mean vel: 2.15 cm2
AV Mean grad: 2.5 mm[Hg]
AV Peak grad: 5.2 mm[Hg]
Ao pk vel: 1.14 m/s
Area-P 1/2: 6.07 cm2
Calc EF: 27.6 %
Est EF: <20
Height: 76 in
S' Lateral: 6.68 cm
Single Plane A2C EF: 33.1 %
Single Plane A4C EF: 31.4 %
Weight: 3396.85 [oz_av]

## 2023-01-06 LAB — CBC
HCT: 32.7 % — ABNORMAL LOW (ref 39.0–52.0)
Hemoglobin: 9.7 g/dL — ABNORMAL LOW (ref 13.0–17.0)
MCH: 23 pg — ABNORMAL LOW (ref 26.0–34.0)
MCHC: 29.7 g/dL — ABNORMAL LOW (ref 30.0–36.0)
MCV: 77.7 fL — ABNORMAL LOW (ref 80.0–100.0)
Platelets: 254 10*3/uL (ref 150–400)
RBC: 4.21 MIL/uL — ABNORMAL LOW (ref 4.22–5.81)
RDW: 16.1 % — ABNORMAL HIGH (ref 11.5–15.5)
WBC: 7.5 10*3/uL (ref 4.0–10.5)
nRBC: 0 % (ref 0.0–0.2)

## 2023-01-06 LAB — COMPREHENSIVE METABOLIC PANEL
ALT: 11 U/L (ref 0–44)
AST: 13 U/L — ABNORMAL LOW (ref 15–41)
Albumin: 3.9 g/dL (ref 3.5–5.0)
Alkaline Phosphatase: 66 U/L (ref 38–126)
Anion gap: 10 (ref 5–15)
BUN: 15 mg/dL (ref 8–23)
CO2: 23 mmol/L (ref 22–32)
Calcium: 9.7 mg/dL (ref 8.9–10.3)
Chloride: 107 mmol/L (ref 98–111)
Creatinine, Ser: 1.41 mg/dL — ABNORMAL HIGH (ref 0.61–1.24)
GFR, Estimated: 54 mL/min — ABNORMAL LOW (ref >60)
Glucose, Bld: 105 mg/dL — ABNORMAL HIGH (ref 70–99)
Potassium: 3.8 mmol/L (ref 3.5–5.1)
Sodium: 140 mmol/L (ref 135–145)
Total Bilirubin: 3.2 mg/dL — ABNORMAL HIGH (ref <1.2)
Total Protein: 6.9 g/dL (ref 6.5–8.1)

## 2023-01-06 LAB — LACTIC ACID, PLASMA
Lactic Acid, Venous: 1.6 mmol/L (ref 0.5–1.9)
Lactic Acid, Venous: 1.4 mmol/L (ref 0.5–1.9)

## 2023-01-06 LAB — COOXEMETRY PANEL
Carboxyhemoglobin: 1.4 % (ref 0.5–1.5)
Carboxyhemoglobin: 2.1 % — ABNORMAL HIGH (ref 0.5–1.5)
Methemoglobin: 0.7 % (ref 0.0–1.5)
Methemoglobin: <0.7 % (ref 0.0–1.5)
O2 Saturation: 44.7 %
O2 Saturation: 65.4 %
Total hemoglobin: 9.8 g/dL — ABNORMAL LOW (ref 12.0–16.0)
Total hemoglobin: 9.8 g/dL — ABNORMAL LOW (ref 12.0–16.0)

## 2023-01-06 LAB — GLUCOSE, CAPILLARY: Glucose-Capillary: 108 mg/dL — ABNORMAL HIGH (ref 70–99)

## 2023-01-06 LAB — MAGNESIUM: Magnesium: 2.4 mg/dL (ref 1.7–2.4)

## 2023-01-06 LAB — MRSA NEXT GEN BY PCR, NASAL: MRSA by PCR Next Gen: NOT DETECTED

## 2023-01-06 MED ORDER — POTASSIUM CHLORIDE CRYS ER 20 MEQ PO TBCR
40.0000 meq | EXTENDED_RELEASE_TABLET | Freq: Once | ORAL | Status: AC
  Administered 2023-01-06: 40 meq via ORAL
  Filled 2023-01-06: qty 2

## 2023-01-06 MED ORDER — MELATONIN 5 MG PO TABS
5.0000 mg | ORAL_TABLET | Freq: Every evening | ORAL | Status: DC | PRN
Start: 2023-01-06 — End: 2023-01-07
  Administered 2023-01-06: 5 mg via ORAL
  Filled 2023-01-06: qty 1

## 2023-01-06 MED ORDER — CHLORHEXIDINE GLUCONATE CLOTH 2 % EX PADS
6.0000 | MEDICATED_PAD | Freq: Every day | CUTANEOUS | Status: DC
  Administered 2023-01-06 – 2023-01-14 (×8): 6 via TOPICAL

## 2023-01-06 MED ORDER — POLYETHYLENE GLYCOL 3350 17 G PO PACK
17.0000 g | PACK | Freq: Every day | ORAL | Status: DC
  Administered 2023-01-06 – 2023-01-14 (×7): 17 g via ORAL
  Filled 2023-01-06 (×9): qty 1

## 2023-01-06 MED ORDER — MEXILETINE HCL 150 MG PO CAPS
150.0000 mg | ORAL_CAPSULE | Freq: Two times a day (BID) | ORAL | Status: DC
  Administered 2023-01-06 – 2023-01-14 (×16): 150 mg via ORAL
  Filled 2023-01-06 (×19): qty 1

## 2023-01-06 MED ORDER — AMIODARONE HCL 100 MG PO TABS: 100.0000 mg | ORAL_TABLET | Freq: Every day | ORAL | Status: DC

## 2023-01-06 MED ORDER — HYDROMORPHONE HCL 1 MG/ML IJ SOLN
1.0000 mg | INTRAMUSCULAR | Status: DC | PRN
  Administered 2023-01-06 – 2023-01-08 (×2): 1 mg via INTRAVENOUS
  Filled 2023-01-06 (×2): qty 1

## 2023-01-06 MED ORDER — MILRINONE LACTATE IN DEXTROSE 20-5 MG/100ML-% IV SOLN
0.1250 ug/kg/min | INTRAVENOUS | Status: DC
  Administered 2023-01-06 – 2023-01-08 (×5): 0.25 ug/kg/min via INTRAVENOUS
  Administered 2023-01-09: 0.125 ug/kg/min via INTRAVENOUS
  Filled 2023-01-06 (×6): qty 100

## 2023-01-06 NOTE — Progress Notes (Signed)
  Echocardiogram 2D Echocardiogram has been performed.  Ocie Doyne RDCS 01/06/2023, 8:47 AM

## 2023-01-06 NOTE — Assessment & Plan Note (Addendum)
Echocardiogram with reduced LV systolic function with EF <20%, global hypokinesis, internal cavity with severe dilatation, RV systolic function preserved, LA and RA with mild dilatation, no significant valvular disease.   Patient had IV furosemide and inotropic support with improvement in volume status, at the time of his discharge his fluid balance is negative -10,419 ml, with significant improvement in his symptoms.   Plan to continue medical therapy spironolactone, losartan and digoxin.  Resume SGLT 2 inh at discharge.  No Beta blockade due to low cardiac output.   His pacemaker has been upgrade for CRT and right atrial lead has been implanted with good toleration.

## 2023-01-06 NOTE — Progress Notes (Addendum)
Afternoon round with Dr [name].   Concerned he has low output heart failure. Diaphoretic. Appears weak. Volume status elevated.   Discussed advanced therapies with Mr Elkind. He is not sure what he would like to pursue but would like to try and optimize if possible.   Check lactic acid. Transfer to ICU. Place central line.    Zarya Lasseigne NP-C  5:10 PM

## 2023-01-06 NOTE — Assessment & Plan Note (Signed)
Continue with nutritional supplements.

## 2023-01-06 NOTE — Assessment & Plan Note (Addendum)
Anemia of chronic disease, with stable cell count.  Iron deficiency anemia with serum iron 11, TIBC 438, transferrin saturation 3 and ferritin 17.   SP IV iron sucrose infusion 200 mg.  Plan to follow up iron panel as outpatient in 3 weeks.  Will need further work up for iron deficiency as outpatient.  His discharge hgb is 8,7.  Risk vs benefit, plan to continue anticoagulation with apixaban.  Currently no signs of active bleeding.

## 2023-01-06 NOTE — Assessment & Plan Note (Signed)
Continue blood pressure control with losartan

## 2023-01-06 NOTE — Assessment & Plan Note (Signed)
Unspecific abdominal pain, with no nausea or vomiting.  Lower abdomen tender to palpation.   Plan to add bowel regimen, he has not moved his bowels in several days. Pain control with as needed hydromorphone.

## 2023-01-06 NOTE — Progress Notes (Signed)
Mobility Specialist Progress Note:   01/06/23 0937  Mobility  Activity Ambulated with assistance in hallway  Level of Assistance Contact guard assist, steadying assist  Assistive Device None  Distance Ambulated (ft) 300 ft  Activity Response Tolerated well  Mobility Referral Yes  $Mobility charge 1 Mobility  Mobility Specialist Start Time (ACUTE ONLY) [id number]  Mobility Specialist Stop Time (ACUTE ONLY) 0950  Mobility Specialist Time Calculation (min) (ACUTE ONLY) 13 min   Pt agreeable to mobility session. Required only contact guards assist for safety during ambulation. Audible crackling and SOB noted, SpO2 WFL on RA. Pt left sitting in chair with all needs met, alarm on.  Addison Lank Mobility Specialist Please contact via SecureChat or  Rehab office at [phone]

## 2023-01-06 NOTE — Progress Notes (Signed)
      Consult received. Chart reviewed. I attempted to see patient for GOC discussion, but he was fatigued from being in the recliner and asking to get back in bed. Also reporting abdominal pain. Discussed with Dr. Ella Jubilee.   I returned to room later for a 2nd attempt, but patient is now sleeping soundly/comfortably. Per MAR, he received Dilaudid 1 mg IV at 14:08.   I will plan to see patient tomorrow morning.     Sherlean Foot, NP-C Palliative Medicine   Please call Palliative Medicine team phone with any questions [phone]. For individual providers please see AMION.   No charge

## 2023-01-06 NOTE — Progress Notes (Signed)
Heart Failure Navigator Progress Note  Assessed for Heart & Vascular TOC clinic readiness.  Patient does not meet criteria due to Advanced Heart Failure Team patient of Dr. [name].   Navigator will sign off at this time.   Rhae Hammock, BSN, Scientist, clinical (histocompatibility and immunogenetics) Only

## 2023-01-06 NOTE — Hospital Course (Addendum)
Mr. Bonnici was admitted to the hospital with the working diagnosis of heart failure exacerbation.   67 yo male with the past medical history of heart failure, hyperlipidemia, coronary artery disease, paroxysmal atrial fibrillation and BPH who presented with worsening dyspnea for 2 to 3 days, associated with orthopnea. On his initial physical examination her blood pressure was 126/76, HR 70, RR 26 and 02 saturation 97%, lungs with no wheezing or rhonchi, heart with S1 and S2 present and regular, abdomen with no distention and no lower extremity edema.  Na 138, K 4.0 Cl 108 bicarbonate 21, glucose 89, bun 16 cr 1,23  BNP 1,126  High sensitive troponin 40 and 33 Iron panel with serum iron 11, TIBC 438, transferrin saturation 3 and ferritin 17  Wbc 6,4 hgb 9,0 plt 240  Digoxin level < 0.2  Sars covid 19 negative  Urine analysis SG 1,016 with negative protein, negative leukocytes and negative Hgb.   Chest radiograph with cardiomegaly, bilateral hilar vascular congestion with bilateral pleural effusions.  Pacemaker defibrillator in place with one right ventricular lead in place.   CT chest with bilateral pleural effusions, emphysema, and cardiomegaly.  CT abdomen and pelvis with moderate size right pleural effusion. Mild circumferential bladder wall thickening.  Prostatomegaly.   EKG 75 bpm, left axis deviation, left bundle branch block, qtc 533, atrial sensed and ventricular paced rhythm, with no significant ST segment or T wave changes.   Patient was placed on IV furosemide for diuresis.    11/22 patient transferred to ICU, for inotropic support.  SV02 44.7, placed on milrinone infusion. 11/23 responding well to inotropic support.  11/24 improved volume status, plan to upgrade pacemaker to CRT and placing a atrial lead.   11/25 weaning down milrinone infusion.  11/26 off milrinone today. 11/27 plan to transfer patient to telemetry and have pacemaker upgrade on 11/29. Milrinone per  peripheral access.   11/28 pending pacemaker upgrade.  11/29 LV lead insertion, atrial insertion with generator replacement  11/30 patient will be discharged home with close follow up. Further workup for iron deficiency as outpatient.

## 2023-01-06 NOTE — TOC Initial Note (Signed)
Transition of Care [facility]) - Initial/Assessment Note    Patient Details  Name: Austin [name] MRN: [id number] Date of Birth: [birth year]  Transition [facility]) CM/SW Contact:    Nicanor Bake Phone Number: [phone] 01/06/2023, 1:30 PM  Clinical Narrative:   HF CSW met with pt at bedside. Pt stated that he lives home alone. Pt stated that he has no history of HH service. Pt stated that he does not use any equipment. Pt stated that he has a scale at home. Pt stated that he still works about 25-30 hours a week. Pt stated that he last visited his PCP about a month ago. CSW explained that a possible hospital follow up me be scheduled closer to dc.   TOC will continue following.                Expected Discharge Plan: Home/Self Care Barriers to Discharge: Continued Medical Work up   Patient Goals and CMS Choice            Expected Discharge Plan and Services       Living arrangements for the past 2 months: Apartment                                      Prior Living Arrangements/Services Living arrangements for the past 2 months: Apartment Lives with:: Self, Pets Patient language and need for interpreter reviewed:: Yes Do you feel safe going back to the place where you live?: Yes      Need for Family Participation in Patient Care: No (Comment) Care giver support system in place?: No (comment)   Criminal Activity/Legal Involvement Pertinent to Current Situation/Hospitalization: No - Comment as needed  Activities of Daily Living   ADL Screening (condition at time of admission) Independently performs ADLs?: Yes (appropriate for developmental age) Is the patient deaf or have difficulty hearing?: Yes Does the patient have difficulty seeing, even when wearing glasses/contacts?: No Does the patient have difficulty concentrating, remembering, or making decisions?: No  Permission Sought/Granted                  Emotional Assessment Appearance::  Appears stated age Attitude/Demeanor/Rapport: Engaged Affect (typically observed): Appropriate Orientation: : Oriented to Self, Oriented to Place, Oriented to  Time, Oriented to Situation Alcohol / Substance Use: Not Applicable Psych Involvement: No (comment)  Admission diagnosis:  Acute CHF (congestive heart failure) (HCC) [I50.9] Patient Active Problem List   Diagnosis Date Noted   Acute CHF (congestive heart failure) (HCC) 01/05/2023   Hyperglycemia 08/11/2022   Malnutrition of moderate degree 08/11/2022   Elevated serum creatinine 08/08/2022   HFrEF (heart failure with reduced ejection fraction) (HCC) 08/08/2022   Acute exacerbation of CHF (congestive heart failure) (HCC) 08/07/2022   Anemia 08/07/2022   Atrial fibrillation (HCC) 08/07/2022   Essential hypertension 03/28/2012   Acute on chronic systolic CHF (congestive heart failure) (HCC) 12/17/2010   PCP:  [name], NP Pharmacy:   [facility] [place], [place] - [address] [place] [address] Phone: [phone] Fax: [phone]  [facility] [address] [place] [address] Phone: [phone] Fax: [phone]     Social Determinants of Health (SDOH) Social History: SDOH Screenings   Food Insecurity: No Food Insecurity (01/05/2023)  Housing: Low Risk  (01/05/2023)  Transportation Needs: No Transportation Needs (01/05/2023)  Utilities: Not  At Risk (01/05/2023)  Financial Resource Strain: Low Risk  (11/01/2022)   Received from [facility]  Physical Activity: Sufficiently Active (11/01/2022)   Received from [facility]  Social Connections: Socially Integrated (11/01/2022)   Received from [facility]  Stress: No Stress Concern Present (11/01/2022)   Received from [facility]  Tobacco Use: Medium Risk (01/05/2023)   SDOH Interventions:     Readmission Risk Interventions     No data to display

## 2023-01-06 NOTE — Progress Notes (Signed)
  Progress Note   Patient: Austin [name] ION:[id number] DOB: 20-May-1955 DOA: 01/05/2023     1 DOS: the patient was seen and examined on 01/06/2023   Brief hospital course: Mr. Gotts was admitted to the hospital with the working diagnosis of heart failure exacerbation.   66 yo male with the past medical history of heart failure, hyperlipidemia, coronary artery disease, paroxysmal atrial fibrillation and BPH who presented with worsening dyspnea for 2 to 3 days, associated with orthopnea. On her initial physical examination her blood pressure was 126/76, HR 70, RR 26 and 02 saturation 97%, lungs with no wheezing or rhonchi, heart with S1 and S2 present and regular, abdomen with no distention and no lower extremity edema.  Chest radiograph with cardiomegaly, bilateral hilar vascular congestion with bilateral pleural effusions.  CT chest with bilateral pleural effusions, emphysema, and cardiomegaly.  CT abdomen and pelvis with moderate size right pleural effusion. Mild circumferential bladder wall thickening.  Prostatomegaly.   EKG 75 bpm, left axis deviation, left bundle branch block, qtc 533, atrial sensed and ventricular paced rhythm, with no significant ST segment or T wave changes.   Patient was placed on IV furosemide for diuresis.        Assessment and Plan: * Acute on chronic systolic CHF (congestive heart failure) (HCC) Echocardiogram with reduced LV systolic function with EF <20%, global hypokinesis, internal cavity with severe dilatation, RV systolic function preserved, LA and RA with mild dilatation, no significant valvular disease.   Urine output is 3,950 ml Systolic blood pressure is 111 to 121 mmHg.   Plan to continue diuresis with IV furosemide, empagliflozin and spironolactone.  Continue losartan and digoxin.   Essential hypertension Continue blood pressure control with losartan.   Atrial fibrillation (HCC) Rate control with digoxin Continue anticoagulation with  apixaban.   Patient has been ventricular paced rhythm frequent PAC/ Started on mexiletine.   Anemia Anemia of chronic disease, with stable cell count.  Malnutrition of moderate degree Continue with nutritional supplements.   Abdominal pain Unspecific abdominal pain, with no nausea or vomiting.  Lower abdomen tender to palpation.   Plan to add bowel regimen, he has not moved his bowels in several days. Pain control with as needed hydromorphone.         Subjective: Patient not feeling well today, positive lower abdominal pain with no nausea or vomiting, no diarrhea.  Dyspnea and edema have improved but not back to baseline   Physical Exam: Vitals:   01/05/23 1330 01/05/23 1538 01/06/23 0000 01/06/23 1206  BP:  125/84  121/86  Pulse:    72  Resp:  20  18  Temp: 98 F (36.7 C) 98.6 F (37 C)  97.8 F (36.6 C)  TempSrc:  Oral  Oral  SpO2:  99% 97% 94%  Weight:  96.3 kg    Height:       Neurology awake and alert ENT with mild pallor Cardiovascular with S1 and S2 present and regular with no gallops, rubs or murmurs Respiratory with scattered rales with no wheezing or rhonchi Abdomen with no distention  Positive lower extremity edema +  Data Reviewed:    Family Communication: no family at the bedside   Disposition: Status is: Inpatient Remains inpatient appropriate because: diuresis   Planned Discharge Destination: Home     Author: Coralie Keens, MD 01/06/2023 4:07 PM  For on call review [email or web address].

## 2023-01-06 NOTE — CV Procedure (Signed)
Central Venous Catheter Insertion Procedure Note ZAION WEERS [id number] 1955-05-27    Procedure: Insertion of Central Venous Catheter Indications: Drug and/or fluid administration   Procedure Details Consent: Risks of procedure as well as the alternatives and risks of each were explained to the (patient/caregiver).  Consent for procedure obtained. Time Out: Verified patient identification, verified procedure, site/side was marked, verified correct patient position, special equipment/implants available, medications/allergies/relevent history reviewed, required imaging and test results available.  Performed   Maximum sterile technique was used including antiseptics, cap, gloves, gown, hand hygiene, mask and sheet. Skin prep: Chlorhexidine; local anesthetic administered A antimicrobial bonded/coated triple lumen catheter was placed in the righ internal jugular vein using the Seldinger technique and u/s guidance.   Evaluation Blood flow good Complications: No apparent complications Patient did tolerate procedure well. Chest X-ray ordered to verify placement.  CXR: pending   Arvilla Meres, MD  7:19 PM

## 2023-01-06 NOTE — Progress Notes (Addendum)
Advanced Heart Failure Rounding Note  PCP-Cardiologist: None   Subjective:   Admitted with volume overload. Started on IV lasix. Overall given 120 mg. Brisk diuresis noted.   Hi PVC burden ~16 %. Some question if he was taking amiodarone. Fill for amiodarone does not match his refill. Last time he picked up amio was in June  Remains SOB with exertion. Just walked with mobility team.    Objective:   Weight Range: 96.3 kg Body mass index is 25.84 kg/m.   Vital Signs:   Temp:  [98 F (36.7 C)-98.6 F (37 C)] 98.6 F (37 C) (11/21 1538) Pulse Rate:  [54-81] 77 (11/21 1200) Resp:  [13-25] 20 (11/21 1538) BP: (104-125)/(72-91) 125/84 (11/21 1538) SpO2:  [97 %-100 %] 97 % (11/22 0000) Weight:  [96.3 kg] 96.3 kg (11/21 1538) Last BM Date : 01/04/23  Weight change: Filed Weights   01/05/23 0915 01/05/23 1538  Weight: 99.8 kg 96.3 kg    Intake/Output:   Intake/Output Summary (Last 24 hours) at 01/06/2023 1000 Last data filed at 01/06/2023 0900 Gross per 24 hour  Intake --  Output 4150 ml  Net -4150 ml      Physical Exam    General:  Sitting in the chair. No resp difficulty HEENT: Normal Neck: Supple. JVP 9-10  Carotids 2+ bilat; no bruits. No lymphadenopathy or thyromegaly appreciated. Cor: PMI nondisplaced. Regular rate & rhythm. No rubs, gallops or murmurs. Lungs: No wheeze. Decreased on right. RML RLL On room air.  Abdomen: Soft, nontender, nondistended. No hepatosplenomegaly. No bruits or masses. Good bowel sounds. Extremities: No cyanosis, clubbing, rash, edema Neuro: Alert & orientedx3, cranial nerves grossly intact. moves all 4 extremities w/o difficulty. Affect pleasant   Telemetry   SR with frequenent PVCs.  EKG    N/A Labs    CBC Recent Labs    01/05/23 1021 01/06/23 0319  WBC 6.4 7.5  HGB 9.0* 9.7*  HCT 31.1* 32.7*  MCV 80.2 77.7*  PLT 240 254   Basic Metabolic Panel Recent Labs    40/98/11 1021 01/06/23 0319  NA 138 140  K  4.0 3.8  CL 108 107  CO2 21* 23  GLUCOSE 89 105*  BUN 16 15  CREATININE 1.23 1.41*  CALCIUM 9.1 9.7  MG 2.2  --    Liver Function Tests Recent Labs    01/05/23 1021 01/06/23 0319  AST 18 13*  ALT 11 11  ALKPHOS 58 66  BILITOT 2.5* 3.2*  PROT 6.2* 6.9  ALBUMIN 3.6 3.9   Recent Labs    01/05/23 1021  LIPASE 28   Cardiac Enzymes No results for input(s): "CKTOTAL", "CKMB", "CKMBINDEX", "TROPONINI" in the last 72 hours.  BNP: BNP (last 3 results) Recent Labs    11/07/22 1429 11/21/22 0902 01/05/23 1021  BNP 1,211.3* 731.6* 1,126.0*    ProBNP (last 3 results) No results for input(s): "PROBNP" in the last 8760 hours.   D-Dimer No results for input(s): "DDIMER" in the last 72 hours. Hemoglobin A1C No results for input(s): "HGBA1C" in the last 72 hours. Fasting Lipid Panel No results for input(s): "CHOL", "HDL", "LDLCALC", "TRIG", "CHOLHDL", "LDLDIRECT" in the last 72 hours. Thyroid Function Tests No results for input(s): "TSH", "T4TOTAL", "T3FREE", "THYROIDAB" in the last 72 hours.  Invalid input(s): "FREET3"  Other results:   Imaging    CT CHEST WO CONTRAST  Result Date: 01/05/2023 CLINICAL DATA:  Respiratory illness, nondiagnostic x-ray. Chest pain, shortness of breath, nausea, and emesis. Near-syncope. EXAM:  CT CHEST WITHOUT CONTRAST TECHNIQUE: Multidetector CT imaging of the chest was performed following the standard protocol without IV contrast. RADIATION DOSE REDUCTION: This exam was performed according to the departmental dose-optimization program which includes automated exposure control, adjustment of the mA and/or kV according to patient size and/or use of iterative reconstruction technique. COMPARISON:  08/07/2022. FINDINGS: Cardiovascular: The heart is enlarged and there is a trace pericardial effusion. Pacemaker leads are noted in the heart. Scattered coronary artery calcifications are present. There is atherosclerotic calcification of the aorta  without evidence of aneurysm. Pulmonary trunk is distended suggesting underlying pulmonary artery hypertension. Mediastinum/Nodes: No mediastinal or axillary lymphadenopathy. Evaluation of the hila is limited due to lack of IV contrast. The thyroid gland, trachea, and esophagus are within normal limits. Lungs/Pleura: There is a moderate right pleural effusion and small left pleural effusion with atelectasis bilaterally. Consolidation is present at the right lung base. Paraseptal and centrilobular emphysematous changes are present in the lungs. No pneumothorax is seen. Upper Abdomen: No acute abnormality. Musculoskeletal: Pacemaker device is present in the anterior chest wall on the left. Mild degenerative changes are present in the thoracic spine. No acute osseous abnormality. IMPRESSION: 1. Moderate pleural effusion on the right and small pleural effusion on the left with associated atelectasis. Consolidation is noted at the right lung base, possible atelectasis or infiltrate. 2. Emphysema. 3. Distended pulmonary trunk suggesting underlying pulmonary artery hypertension. 4. Cardiomegaly with coronary artery calcifications. 5. Aortic atherosclerosis. Electronically Signed   By: Thornell Sartorius M.D.   On: 01/05/2023 20:45   CT ABDOMEN PELVIS W CONTRAST  Result Date: 01/05/2023 CLINICAL DATA:  Abdominal pain, acute, nonlocalized EXAM: CT ABDOMEN AND PELVIS WITH CONTRAST TECHNIQUE: Multidetector CT imaging of the abdomen and pelvis was performed using the standard protocol following bolus administration of intravenous contrast. RADIATION DOSE REDUCTION: This exam was performed according to the departmental dose-optimization program which includes automated exposure control, adjustment of the mA and/or kV according to patient size and/or use of iterative reconstruction technique. CONTRAST:  75mL OMNIPAQUE IOHEXOL 350 MG/ML SOLN COMPARISON:  CT abdomen/pelvis dated August 07, 2022. FINDINGS: Lower chest: Small to  moderate-sized right pleural effusion and small left pleural effusion with basilar atelectasis. Patchy consolidative opacity at the right lung base. Hepatobiliary: Stable 9 mm hypoattenuating focus at the right hepatic dome, too small to definitively characterize. The previously described 1.3 cm focus of enhancement in the left hepatic lobe is not significantly changed and likely represents either a hemangioma or benign vascular abnormality. No gallstones, gallbladder wall thickening, or biliary dilatation. Pancreas: Unremarkable. No pancreatic ductal dilatation or surrounding inflammatory changes. Spleen: Normal in size. Unchanged 5 mm enhancing focus at the anterior inferior spleen (series 3, image 35), which could represent a small hemangioma or benign vascular lesion. The previously noted 2 additional foci of enhancement are not clearly visualized on this exam. Adrenals/Urinary Tract: The adrenal glands are unremarkable. The kidneys enhance symmetrically. No suspicious focal lesion. Punctate 2 mm hyperdense foci in the interpolar left and right kidneys could represent small nonobstructing stones or foci of vascular calcification. No hydronephrosis. Mild circumferential bladder wall thickening. Stomach/Bowel: Stomach is within normal limits. Appendix appears normal. No evidence of bowel wall thickening, distention, or inflammatory changes. Sigmoid colonic diverticulosis without evidence of acute diverticulitis. Moderate volume of stool throughout the colon. Vascular/Lymphatic: Aortic atherosclerosis. No enlarged abdominal or pelvic lymph nodes. Reproductive: The prostate gland is enlarged and indents the base of the bladder. Other: Small fat containing umbilical hernia. No abdominopelvic  ascites. No intraperitoneal free air. Musculoskeletal: No acute osseous abnormality. No suspicious osseous lesion. Degenerative disc disease at L5-S1. IMPRESSION: 1. Small to moderate-sized right pleural effusion and small left  pleural effusion. Patchy consolidative opacity at the right lung base could represent atelectasis or infiltrate. 2. Mild circumferential bladder wall thickening may be due to chronic outlet obstruction or cystitis. Recommend correlation with urinalysis. 3. Prostatomegaly. 4. Additional ancillary findings, as described above. 5. Aortic Atherosclerosis (ICD10-I70.0). Electronically Signed   By: Hart Robinsons M.D.   On: 01/05/2023 14:14     Medications:     Scheduled Medications:  amiodarone  100 mg Oral Daily   apixaban  5 mg Oral BID   digoxin  0.125 mg Oral Daily   empagliflozin  10 mg Oral Daily   furosemide  80 mg Intravenous Q12H   losartan  25 mg Oral Daily   pravastatin  40 mg Oral Daily   spironolactone  25 mg Oral Daily    Infusions:   PRN Medications: acetaminophen **OR** acetaminophen, ondansetron **OR** ondansetron (ZOFRAN) IV    Patient Profile   67 y.o. male with history of chronic systolic CHF s/p ICD, PAF, HTN, CKD III. Admitted with acute on chronic systolic CHF.   Assessment/Plan   1. Chronic Systolic Heart Failure - NICM  - Initially diagnosed with HFrEF in 2012. EF previously improved to 50-55% on medications.  - Echo (6/24): EF now < 20%. He does have single chamber Biotronik.  - R/LHC (7/24) w/ minimal CAD, mildly elevated filling pressures and severely reduced CO/CI by thermo 4.3/1.9.  - Possible cardiomyopathy from high PVC burden.  - NYHA IV. Volume status improving. Continue 80 mg IV lasix twice a day.  Once diuresed he will need 20 mg daily torsemide.  - Continue Jardiance 10 mg daily. - Continue digoxin 0.125 mg daily. - Continue spironolactone 25 mg daily  - Continue losartan 25 mg daily. Watch renal function.  - Device interrogated today. No AF or VT. No shocks. 16% PVC burden. 28% V paced .  - Worried that he is nearing end-stage. VAD workup had previously been started. He states he discussed VAD with his family and reports he would not want  to pursue. Palliative home inotropes may be an option. Reports poor quality of life this year.  Palliative care consulted to further discuss goals of care.   2. A fib  - In NSR 04/2022. - Back in AF 5/24 during admit,  -In SR. Has been on amiodarone 100 mg daily. ? If  he has picked it up refill.   - Continue Eliquis 5 mg BID   3. CKD Stage IIIa - Baseline SCr 1.2>1.4 today  - Continue SGLT2i   4. ?RV Lead vegetation - possible that this is due to the "curl-i-cue" that the lead forms in the right heart, as noted on CXR.  - No vegetation on TEE 7/24   5. PVCs - High PVC burden. ~ 50% during prior admission - 16% PVC burden on device check  -Not taking amio at home?  - Add mexiletine 150 mg twice a day to help suppress PVCs  6. Empyhsema Noted on CT chest   Length of Stay: 1  Amy Clegg, NP  01/06/2023, 10:00 AM  Advanced Heart Failure Team Pager [phone] (M-F; 7a - 5p)  Please contact [facility] for night-coverage after hours (5p -7a ) and weekends on [email or web address]  Agree with above  Feels terrible. SOB. Diaphoretic. Not diuresing well.   General:  Weak appearing. Diaphoretic HEENT: normal Neck: supple.JVP to ear. Carotids 2+ bilat; no bruits. No lymphadenopathy or thryomegaly appreciated. Cor: Regular rate & rhythm. +s3 Lungs: clear Abdomen: soft, + tender, + distended. No hepatosplenomegaly. No bruits or masses. Good bowel sounds. Extremities: no cyanosis, clubbing, rash, 1+ edema Neuro: alert & orientedx3, cranial nerves grossly intact. moves all 4 extremities w/o difficulty. Affect pleasant   He has end-stage HF now with likely low output. We discussed potential VAD but he remains uninterested.   Will move to ICU. Place central access and can start inotropes as needed in an attempt to tune him up. Agree with PVC suppression with mexilitene.   Consult Palliative Care to begin GOC discussion.   CRITICAL CARE Performed by: Arvilla Meres  Total critical  care time: 45 minutes  Critical care time was exclusive of separately billable procedures and treating other patients.  Critical care was necessary to treat or prevent imminent or life-threatening deterioration.  Critical care was time spent personally by me (independent of midlevel providers or residents) on the following activities: development of treatment plan with patient and/or surrogate as well as nursing, discussions with consultants, evaluation of patient's response to treatment, examination of patient, obtaining history from patient or surrogate, ordering and performing treatments and interventions, ordering and review of laboratory studies, ordering and review of radiographic studies, pulse oximetry and re-evaluation of patient's condition.  Arvilla Meres, MD  6:11 PM

## 2023-01-06 NOTE — Assessment & Plan Note (Addendum)
Rate control with digoxin Patient was placed on IV heparin for anticoagulation bridging in preparation for pacemaker upgrade.   Started on mexiletine to suppress PVC  Considering under atrial sensing and reduced LV systolic function with prolonged qrs, patient had his pacemaker upgrade to CRT and a right atrial lead was placed, replaced generator.   11/30 EKG with 60 bpm, left axis deviation, left bundle branch block, qtc 470, atrial sensing, atrial pacing and ventricular pacing, with no significant ST segment or T wave changes.   Plan to resume apixaban on 01/17/23 pm.

## 2023-01-07 DIAGNOSIS — I10 Essential (primary) hypertension: Secondary | ICD-10-CM | POA: Diagnosis not present

## 2023-01-07 DIAGNOSIS — Z7189 Other specified counseling: Secondary | ICD-10-CM | POA: Diagnosis not present

## 2023-01-07 DIAGNOSIS — E440 Moderate protein-calorie malnutrition: Secondary | ICD-10-CM | POA: Diagnosis not present

## 2023-01-07 DIAGNOSIS — I5023 Acute on chronic systolic (congestive) heart failure: Secondary | ICD-10-CM | POA: Diagnosis not present

## 2023-01-07 DIAGNOSIS — I5022 Chronic systolic (congestive) heart failure: Secondary | ICD-10-CM | POA: Diagnosis not present

## 2023-01-07 DIAGNOSIS — Z515 Encounter for palliative care: Secondary | ICD-10-CM | POA: Diagnosis not present

## 2023-01-07 DIAGNOSIS — I4819 Other persistent atrial fibrillation: Secondary | ICD-10-CM | POA: Diagnosis not present

## 2023-01-07 LAB — BASIC METABOLIC PANEL
Anion gap: 11 (ref 5–15)
Anion gap: 15 (ref 5–15)
BUN: 20 mg/dL (ref 8–23)
BUN: 20 mg/dL (ref 8–23)
CO2: 23 mmol/L (ref 22–32)
CO2: 22 mmol/L (ref 22–32)
Calcium: 9.6 mg/dL (ref 8.9–10.3)
Calcium: 9.7 mg/dL (ref 8.9–10.3)
Chloride: 103 mmol/L (ref 98–111)
Chloride: 101 mmol/L (ref 98–111)
Creatinine, Ser: 1.59 mg/dL — ABNORMAL HIGH (ref 0.61–1.24)
Creatinine, Ser: 1.7 mg/dL — ABNORMAL HIGH (ref 0.61–1.24)
GFR, Estimated: 47 mL/min — ABNORMAL LOW (ref >60)
GFR, Estimated: 43 mL/min — ABNORMAL LOW (ref >60)
Glucose, Bld: 114 mg/dL — ABNORMAL HIGH (ref 70–99)
Glucose, Bld: 110 mg/dL — ABNORMAL HIGH (ref 70–99)
Potassium: 3.9 mmol/L (ref 3.5–5.1)
Potassium: 3.8 mmol/L (ref 3.5–5.1)
Sodium: 137 mmol/L (ref 135–145)
Sodium: 138 mmol/L (ref 135–145)

## 2023-01-07 LAB — CBC
HCT: 32.2 % — ABNORMAL LOW (ref 39.0–52.0)
Hemoglobin: 9.3 g/dL — ABNORMAL LOW (ref 13.0–17.0)
MCH: 22.6 pg — ABNORMAL LOW (ref 26.0–34.0)
MCHC: 28.9 g/dL — ABNORMAL LOW (ref 30.0–36.0)
MCV: 78.2 fL — ABNORMAL LOW (ref 80.0–100.0)
Platelets: 251 10*3/uL (ref 150–400)
RBC: 4.12 MIL/uL — ABNORMAL LOW (ref 4.22–5.81)
RDW: 16.1 % — ABNORMAL HIGH (ref 11.5–15.5)
WBC: 8.9 10*3/uL (ref 4.0–10.5)
nRBC: 0 % (ref 0.0–0.2)

## 2023-01-07 LAB — MAGNESIUM
Magnesium: 2.3 mg/dL (ref 1.7–2.4)
Magnesium: 2.2 mg/dL (ref 1.7–2.4)

## 2023-01-07 LAB — COOXEMETRY PANEL
Carboxyhemoglobin: 1.9 % — ABNORMAL HIGH (ref 0.5–1.5)
Methemoglobin: 0.8 % (ref 0.0–1.5)
O2 Saturation: 69.2 %
Total hemoglobin: 9.8 g/dL — ABNORMAL LOW (ref 12.0–16.0)

## 2023-01-07 MED ORDER — BISACODYL 5 MG PO TBEC
5.0000 mg | DELAYED_RELEASE_TABLET | Freq: Once | ORAL | Status: AC
  Administered 2023-01-07: 5 mg via ORAL
  Filled 2023-01-07: qty 1

## 2023-01-07 MED ORDER — ORAL CARE MOUTH RINSE: 15.0000 mL | OROMUCOSAL | Status: DC | PRN

## 2023-01-07 MED ORDER — MELATONIN 3 MG PO TABS
6.0000 mg | ORAL_TABLET | Freq: Every day | ORAL | Status: DC
  Administered 2023-01-07 – 2023-01-13 (×7): 6 mg via ORAL
  Filled 2023-01-07 (×7): qty 2

## 2023-01-07 MED ORDER — HEPARIN (PORCINE) 25000 UT/250ML-% IV SOLN
1600.0000 [IU]/h | INTRAVENOUS | Status: DC
  Administered 2023-01-07: 1100 [IU]/h via INTRAVENOUS
  Administered 2023-01-08: 1500 [IU]/h via INTRAVENOUS
  Filled 2023-01-07 (×2): qty 250

## 2023-01-07 MED ORDER — SORBITOL 70 % SOLN
30.0000 mL | Freq: Once | Status: AC
  Administered 2023-01-07: 30 mL via ORAL
  Filled 2023-01-07: qty 30

## 2023-01-07 NOTE — Progress Notes (Signed)
Advanced Heart Failure Rounding Note  PCP-Cardiologist: None   Subjective:   Admitted with volume overload. Started on IV lasix. Overall given 120 mg. Brisk diuresis noted.   Hi PVC burden ~16 %. Some question if he was taking amiodarone. Fill for amiodarone does not match his refill. Last time he picked up amio was in June  Moved to ICU yesterday. TLC placed. Co-ox 45%. Started on milrinone 0.25. Co-ox 69%  CVP 6  Still SOB but feels better. Ab sore.    Objective:   Weight Range: 89.1 kg Body mass index is 23.91 kg/m.   Vital Signs:   Temp:  [97.6 F (36.4 C)-98.6 F (37 C)] 98 F (36.7 C) (11/23 0753) Pulse Rate:  [30-165] 43 (11/23 1300) Resp:  [11-24] 16 (11/23 1300) BP: (81-144)/(55-89) 98/76 (11/23 1231) SpO2:  [89 %-100 %] 94 % (11/23 1300) Weight:  [89.1 kg] 89.1 kg (11/23 0500) Last BM Date : 01/04/23  Weight change: Filed Weights   01/05/23 0915 01/05/23 1538 01/07/23 0500  Weight: 99.8 kg 96.3 kg 89.1 kg    Intake/Output:   Intake/Output Summary (Last 24 hours) at 01/07/2023 1534 Last data filed at 01/07/2023 0900 Gross per 24 hour  Intake 684.56 ml  Output 1050 ml  Net -365.44 ml      Physical Exam    General:  Sitting up in chair Coarse BS HEENT: normal Neck: supple. RIJ TLCCarotids 2+ bilat; no bruits. No lymphadenopathy or thryomegaly appreciated. Cor: PMI nondisplaced. Regular rate & rhythm. No rubs, gallops or murmurs. Lungs: Coarse Abdomen: soft, nontender, nondistended. No hepatosplenomegaly. No bruits or masses. Good bowel sounds. Extremities: no cyanosis, clubbing, rash, edema Neuro: alert & orientedx3, cranial nerves grossly intact. moves all 4 extremities w/o difficulty. Affect pleasant   Telemetry   SR with frequent v-pacing   Labs    CBC Recent Labs    01/06/23 0319 01/06/23 2322  WBC 7.5 8.9  HGB 9.7* 9.3*  HCT 32.7* 32.2*  MCV 77.7* 78.2*  PLT 254 251   Basic Metabolic Panel Recent Labs     01/06/23 2322 01/07/23 0441  NA 137 138  K 3.9 3.8  CL 103 101  CO2 23 22  GLUCOSE 114* 110*  BUN 20 20  CREATININE 1.59* 1.70*  CALCIUM 9.6 9.7  MG 2.3 2.2   Liver Function Tests Recent Labs    01/05/23 1021 01/06/23 0319  AST 18 13*  ALT 11 11  ALKPHOS 58 66  BILITOT 2.5* 3.2*  PROT 6.2* 6.9  ALBUMIN 3.6 3.9   Recent Labs    01/05/23 1021  LIPASE 28   Cardiac Enzymes No results for input(s): "CKTOTAL", "CKMB", "CKMBINDEX", "TROPONINI" in the last 72 hours.  BNP: BNP (last 3 results) Recent Labs    11/07/22 1429 11/21/22 0902 01/05/23 1021  BNP 1,211.3* 731.6* 1,126.0*    ProBNP (last 3 results) No results for input(s): "PROBNP" in the last 8760 hours.   D-Dimer No results for input(s): "DDIMER" in the last 72 hours. Hemoglobin A1C No results for input(s): "HGBA1C" in the last 72 hours. Fasting Lipid Panel No results for input(s): "CHOL", "HDL", "LDLCALC", "TRIG", "CHOLHDL", "LDLDIRECT" in the last 72 hours. Thyroid Function Tests No results for input(s): "TSH", "T4TOTAL", "T3FREE", "THYROIDAB" in the last 72 hours.  Invalid input(s): "FREET3"  Other results:   Imaging    DG CHEST PORT 1 VIEW  Result Date: 01/06/2023 CLINICAL DATA:  Central venous catheter placement EXAM: PORTABLE CHEST 1 VIEW COMPARISON:  01/05/2023 FINDINGS: Right internal jugular central venous catheter tip noted within the superior vena cava. Lungs are symmetrically mildly hyperinflated in keeping with changes of underlying COPD. Small right pleural effusion. No confluent pulmonary infiltrate. No pneumothorax. Cardiac size is mildly enlarged, unchanged. Left subclavian single lead pacemaker defibrillator is unchanged. No acute bone abnormality. IMPRESSION: 1. Right internal jugular central venous catheter tip within the superior vena cava. No pneumothorax. 2. Small right pleural effusion. 3. COPD. Electronically Signed   By: Helyn Numbers M.D.   On: 01/06/2023 20:35      Medications:     Scheduled Medications:  apixaban  5 mg Oral BID   Chlorhexidine Gluconate Cloth  6 each Topical Daily   digoxin  0.125 mg Oral Daily   empagliflozin  10 mg Oral Daily   furosemide  80 mg Intravenous Q12H   losartan  25 mg Oral Daily   mexiletine  150 mg Oral Q12H   polyethylene glycol  17 g Oral Daily   pravastatin  40 mg Oral Daily   spironolactone  25 mg Oral Daily    Infusions:  milrinone 0.25 mcg/kg/min (01/07/23 0900)    PRN Medications: acetaminophen **OR** acetaminophen, HYDROmorphone (DILAUDID) injection, melatonin, ondansetron **OR** ondansetron (ZOFRAN) IV    Patient Profile   67 y.o. male with history of chronic systolic CHF s/p ICD, PAF, HTN, CKD III. Admitted with acute on chronic systolic CHF.   Assessment/Plan   1. Acute on chronic Systolic Heart Failure -> cardiogenic shock - NICM  - Initially diagnosed with HFrEF in 2012. EF previously improved to 50-55% on medications.  - Echo (6/24): EF now < 20%. He does have single chamber Biotronik.  - R/LHC (7/24) w/ minimal CAD, mildly elevated filling pressures and severely reduced CO/CI by thermo 4.3/1.9.  - Possible cardiomyopathy from high PVC burden.  - NYHA IV - Continue Jardiance 10 mg daily. - Continue digoxin 0.125 mg daily. - Continue spironolactone 25 mg daily  - Continue losartan 25 mg daily. - Moved to ICU on 11/22 -> co-ox 45% -> started on milrinone 0.25 - Co-ox 69% Continue milrinone. No diuretics today - ICD interrogated today with EP and looks like he developed AV block in 10/24 and RV pacing went up > 70%. Now in SR with long AV delay causing frequent paced beats and dropped beats as well.  - I think best option is to upgrade device to CRT device with RA lead as well  - he has advanced HF. He is adamant that he does not want VAD.    2. Paroxysmal A fib  - Remains in NSR  - Switch Eliquis to heparin in case he needs device upgrade   3. CKD Stage IIIa - Baseline SCr  1.2>1.4 -> 1.7 today  - Continue SGLT2i   4. ?RV Lead vegetation - possible that this is due to the "curl-i-cue" that the lead forms in the right heart, as noted on CXR.  - No vegetation on TEE 7/24   5. PVCs - High PVC burden. ~ 50% during prior admission - Not taking amio - Continue mexiletine 150 mg twice a day to help suppress PVCs  6. Empyhsema Noted on CT chest   CRITICAL CARE Performed by: Arvilla Meres  Total critical care time: 40 minutes  Critical care time was exclusive of separately billable procedures and treating other patients.  Critical care was necessary to treat or prevent imminent or life-threatening deterioration.  Critical care was time spent personally by me (independent  of midlevel providers or residents) on the following activities: development of treatment plan with patient and/or surrogate as well as nursing, discussions with consultants, evaluation of patient's response to treatment, examination of patient, obtaining history from patient or surrogate, ordering and performing treatments and interventions, ordering and review of laboratory studies, ordering and review of radiographic studies, pulse oximetry and re-evaluation of patient's condition.   Length of Stay: 2  Arvilla Meres, MD  01/07/2023, 3:34 PM  Advanced Heart Failure Team Pager [phone] (M-F; 7a - 5p)  Please contact [facility] for night-coverage after hours (5p -7a ) and weekends on [email or web address]

## 2023-01-07 NOTE — Progress Notes (Signed)
Progress Note   Patient: Austin [name] ZOX:[id number] DOB: [birth year] DOA: 01/05/2023     2 DOS: the patient was seen and examined on 01/07/2023   Brief hospital course: [name] was admitted to the hospital with the working diagnosis of heart failure exacerbation.   67 yo male with the past medical history of heart failure, hyperlipidemia, coronary artery disease, paroxysmal atrial fibrillation and BPH who presented with worsening dyspnea for 2 to 3 days, associated with orthopnea. On her initial physical examination her blood pressure was 126/76, HR 70, RR 26 and 02 saturation 97%, lungs with no wheezing or rhonchi, heart with S1 and S2 present and regular, abdomen with no distention and no lower extremity edema.  Chest radiograph with cardiomegaly, bilateral hilar vascular congestion with bilateral pleural effusions.  CT chest with bilateral pleural effusions, emphysema, and cardiomegaly.  CT abdomen and pelvis with moderate size right pleural effusion. Mild circumferential bladder wall thickening.  Prostatomegaly.   EKG 75 bpm, left axis deviation, left bundle branch block, qtc 533, atrial sensed and ventricular paced rhythm, with no significant ST segment or T wave changes.   Patient was placed on IV furosemide for diuresis.    11/22 patient transferred to ICU, for inotropic support.  SV02 44.7, placed on milrinone infusion. 11/23 responding well to inotropic support.   Assessment and Plan: * Acute on chronic systolic CHF (congestive heart failure) (HCC) Echocardiogram with reduced LV systolic function with EF <20%, global hypokinesis, internal cavity with severe dilatation, RV systolic function preserved, LA and RA with mild dilatation, no significant valvular disease.   Urine output is 1,250 ml Systolic blood pressure is 100 to 140 mmHg.  SV02 69.2   Plan to continue diuresis with IV furosemide, empagliflozin and spironolactone.  Continue losartan and digoxin.   Milrinone 0.25 mcg/ kg/ hr.   Essential hypertension Continue blood pressure control with losartan.   Atrial fibrillation (HCC) Rate control with digoxin Continue anticoagulation with apixaban.   Patient has been ventricular paced rhythm frequent PAC/ Started on mexiletine.   Anemia Anemia of chronic disease, with stable cell count.  Malnutrition of moderate degree Continue with nutritional supplements.   Abdominal pain 11/21 abdomen and pelvis CT with moderate of stool throughout the colon with no other acute changes.   Continue bowel regimen. Pain control with as needed hydromorphone.         Subjective: Patient is feeling better today,, with no chest pain, no PND or orthopnea., lower extremity edema has improved.   Physical Exam: Vitals:   01/07/23 0900 01/07/23 0939 01/07/23 1000 01/07/23 1100  BP: 100/71  (!) 144/55 (!) 138/58  Pulse: (!) 43 (!) 48 (!) 54 62  Resp: 16 16 15 15   Temp:      TempSrc:      SpO2: 98% 97% 90% 94%  Weight:      Height:       Neurology awake and alert ENT with mild pallor Cardiovascular with S1 and S2 present with no gallops, rubs or murmurs No JVD Trace lower extremity edema.  Respiratory with mild rales at bases with no wheezing Abdomen with no distention, tender to deep palpation with no guarding or rebound  Data Reviewed:    Family Communication: no family at the bedside   Disposition: Status is: Inpatient Remains inpatient appropriate because: IV diuresis and IV inotropic support.   Planned Discharge Destination: Home    Author: Coralie Keens, MD 01/07/2023 12:43 PM  For on call review [email or web address].

## 2023-01-07 NOTE — Consult Note (Signed)
Palliative Care Consult Note                                  Date: 01/07/2023   Patient Name: Austin [name]  DOB: April 26, 1955  MRN: [id number]  Age / Sex: 67 y.o., male  PCP: Austin Galas, NP Referring Physician: Coralie [name]  Reason for Consultation: Establishing goals of care  HPI/Patient Profile: 67 y.o. male  with past medical history of chronic systolic CHF s/p ICD, nonischemic cardiomyopathy, paroxysmal atrial fibrillation, CKD stage III, and hypertension who was admitted on 01/05/2023 with acute on chronic heart failure. He was transferred to ICU on 11/22 due to cardiogenic shock and started on milrinone.   Palliative Medicine was consulted for goals of care and medical decision making.    Subjective:   I have reviewed medical records including EPIC notes, labs and imaging. Received updates from patient's RN.  Patient has  high PVC burden. ICD interrogated earlier today.   I met with patient and his brothers Austin Hua and Austin [name] at bedside to discuss diagnosis, prognosis, and goals of care.  I introduced Palliative Medicine as specialized medical care for people living with serious illness. It focuses on providing relief from the symptoms and stress of a serious illness.   Created space and opportunity for patient and family to explore thoughts and feelings regarding current medical situation. Values and goals of care were attempted to be elicited.  Social History: Patient lives alone. Prior to admission, he was fully independent and working part-time at a [employer] in [place]. His siblings are his only family support, and they all live in [place], [place] (except for 1 sister that lives in [place]).   Today's Discussion: We had a very open and honest discussion about the seriousness of patient's current medical situation. Discussed the natural disease trajectory of advanced heart failure,  emphasizing it is non-curable and progressive. Discussed that treatment options are limited, as patient is adamant he does not want LVAD.   Discussed the "what ifs" and encouraged patient to consider what medical interventions patient would or would not want in the event his condition were to deteriorate, keeping in mind the concept of quality of life.    Discussed code status. I encouraged consideration of DNR/DNI status and provided education on evidence-based poor outcomes in similar hospitalized patients, as the cause of cardiac arrest would likely be associated with advanced illness rather than a reversible condition. Patient is agreeable to DNR/DNI, stating his desire for a natural death when that time comes.   Patient speaks to the importance of quality of life and that he highly values his independence. He is clear he would not want his life prolonged if he were in a state of being dependent for care. For now, he wants to continue medical optimization with the goal to feel better and to ultimately return home. He understands there is a possibility he would need to be on milrinone at home.   Discussed the importance of continued conversation with patient and their medical providers regarding overall plan of care and treatment options.   Review of Systems  Constitutional:  Positive for fatigue.  Respiratory:  Positive for shortness of breath.     Objective:   Primary Diagnoses: Present on Admission:  Anemia  Atrial fibrillation (HCC)  Essential hypertension  Malnutrition of moderate degree  Acute on chronic systolic CHF (congestive heart failure) (HCC)  Physical Exam Vitals reviewed.  Constitutional:      General: He is not in acute distress.    Appearance: He is ill-appearing.  Cardiovascular:     Comments: PVCs Pulmonary:     Effort: No respiratory distress.  Neurological:     Mental Status: He is alert and oriented to person, place, and time.     Vital Signs:  BP  125/69   Pulse (!) 48   Temp 98 F (36.7 C) (Oral)   Resp 18   Ht 6\' 4"  (1.93 m)   Wt 89.1 kg   SpO2 99%   BMI 23.91 kg/m   Palliative Assessment/Data: PPS 40%     Assessment & Plan:   SUMMARY OF RECOMMENDATIONS   Code status changed to DNR/DNI Continue current scope of care with goal for medical optimization Patient is hopeful to ultimately return home - discussed possibility of needing home milrinone  PMT will continue to follow  Primary Decision Maker: PATIENT He would want his brothers Austin Hua and Austin [name] to make medical decisions on his behalf if needed   Prognosis:  Unable to determine  Discharge Planning:  To Be Determined   Discussed with: Dr. Gala [name]   Thank you for allowing [place] to participate in the care of Austin [name]   Time Total: 90 minutes  Detailed review of medical records (labs, imaging, vital signs), medically appropriate exam, discussed with treatment team, counseling and education to patient, family, & staff, documenting clinical information, medication management, coordination of care.   Signed by: Sherlean Foot, NP Palliative Medicine Team  Team Phone # [phone]  For individual providers, please see AMION

## 2023-01-07 NOTE — Progress Notes (Signed)
PHARMACY - ANTICOAGULATION CONSULT NOTE  Pharmacy Consult for heparin Indication: atrial fibrillation  Allergies  Allergen Reactions   Lisinopril Cough    Patient Measurements: Height: 6\' 4"  (193 cm) Weight: 89.1 kg (196 lb 6.9 oz) IBW/kg (Calculated) : 86.8  Vital Signs: Temp: 98 F (36.7 C) (11/23 0753) Temp Source: Oral (11/23 0753) BP: 125/69 (11/23 1500) Pulse Rate: 48 (11/23 1552)  Labs: Recent Labs    01/05/23 1021 01/05/23 1157 01/06/23 0319 01/06/23 2322 01/07/23 0441  HGB 9.0*  --  9.7* 9.3*  --   HCT 31.1*  --  32.7* 32.2*  --   PLT 240  --  254 251  --   CREATININE 1.23  --  1.41* 1.59* 1.70*  TROPONINIHS 40* 33*  --   --   --     Estimated Creatinine Clearance: 50.3 mL/min (A) (by C-G formula based on SCr of 1.7 mg/dL (H)).   Medical History: Past Medical History:  Diagnosis Date   Benign prostatic hypertrophy    Coronary artery disease    Hyperlipidemia    Nonischemic cardiomyopathy (HCC) 11/15/2010   cath 12/10/10 minimal nonobstructive coronary artery disease   Systolic heart failure 11/15/2010   echo 12/08/10 EF 20%, severe left ventricular enlargement, mild right ventricular enlargement    Medications:  Medications Prior to Admission  Medication Sig Dispense Refill Last Dose   albuterol (VENTOLIN HFA) 108 (90 Base) MCG/ACT inhaler Inhale 1-2 puffs into the lungs every 6 (six) hours as needed.   01/05/2023   amiodarone (PACERONE) 100 MG tablet Take 1 tablet (100 mg total) by mouth daily. 30 tablet 3 Past Week   apixaban (ELIQUIS) 5 MG TABS tablet Take 5 mg by mouth 2 (two) times daily.   01/04/2023 at PM   Cholecalciferol 50 MCG (2000 UT) TABS Take 1 tablet by mouth daily.   Past Week   digoxin (LANOXIN) 0.125 MG tablet Take 1 tablet (0.125 mg total) by mouth daily. 90 tablet 3 Past Week   fenofibrate micronized (LOFIBRA) 67 MG capsule Take 67 mg by mouth every morning.   Past Week   ferrous sulfate 325 (65 FE) MG tablet Take 325 mg by  mouth.   Past Week   finasteride (PROSCAR) 5 MG tablet Take 1 tablet by mouth daily.   Past Week   HYDROcodone-acetaminophen (NORCO/VICODIN) 5-325 MG tablet Take 1-2 tablets by mouth every 6 (six) hours as needed for moderate pain (pain score 4-6).   Past Week   JARDIANCE 10 MG TABS tablet Take 1 tablet (10 mg total) by mouth daily. 30 tablet 11 Past Week   losartan (COZAAR) 25 MG tablet Take 1 tablet (25 mg total) by mouth daily. 90 tablet 3 Past Week   melatonin 3 MG TABS tablet Take 6 mg by mouth at bedtime.   Past Week   omeprazole (PRILOSEC) 20 MG capsule Take 20 mg by mouth daily.   Past Week   ondansetron (ZOFRAN) 4 MG tablet Take 4 mg by mouth daily as needed for nausea or vomiting.   Unk   pravastatin (PRAVACHOL) 40 MG tablet Take 1 tablet (40 mg total) by mouth daily. 90 tablet 2 Past Week   spironolactone (ALDACTONE) 25 MG tablet Take 1 tablet (25 mg total) by mouth daily. 90 tablet 3 Past Week   Tamsulosin HCl (FLOMAX) 0.4 MG CAPS Take 0.4 mg by mouth daily after supper.   Past Week   torsemide (DEMADEX) 20 MG tablet Take 1 tablet (20 mg total) by  mouth every Monday, Wednesday, and Friday. 90 tablet 3 Past Week   cephALEXin (KEFLEX) 500 MG capsule Take 500 mg by mouth 4 (four) times daily. (Patient not taking: Reported on 01/06/2023)   Not Taking   potassium chloride (KLOR-CON) 10 MEQ tablet Take 1 tablet (10 mEq total) by mouth every Monday, Wednesday, and Friday. (Patient not taking: Reported on 01/06/2023) 30 tablet 3 Not Taking   Scheduled:   Chlorhexidine Gluconate Cloth  6 each Topical Daily   digoxin  0.125 mg Oral Daily   empagliflozin  10 mg Oral Daily   furosemide  80 mg Intravenous Q12H   losartan  25 mg Oral Daily   mexiletine  150 mg Oral Q12H   polyethylene glycol  17 g Oral Daily   pravastatin  40 mg Oral Daily   spironolactone  25 mg Oral Daily    Assessment 53 you male with afib on apixaban (last dose 11/23 in the morning) and for possible ICD upgrade.  Pharmacy consulted to dose heparin.  -hg= 9.3, plt= 251  Goal of Therapy:  Heparin level 0.3-0.7 units/ml aPTT 66-102 seconds Monitor platelets by anticoagulation protocol: Yes   Plan:  -No heparin bolus -Start heparin 1100 units/hr at 10pm -Heparin level in 6 hours and daily wth CBC daily  Harland German, PharmD Clinical Pharmacist **Pharmacist phone directory can now be found on [email or web address] ([email or web address]).  Listed under [facility].

## 2023-01-07 NOTE — Plan of Care (Signed)

## 2023-01-08 ENCOUNTER — Encounter (HOSPITAL_COMMUNITY): Payer: Self-pay | Admitting: Internal Medicine

## 2023-01-08 DIAGNOSIS — I10 Essential (primary) hypertension: Secondary | ICD-10-CM | POA: Diagnosis not present

## 2023-01-08 DIAGNOSIS — K5900 Constipation, unspecified: Secondary | ICD-10-CM

## 2023-01-08 DIAGNOSIS — I5023 Acute on chronic systolic (congestive) heart failure: Secondary | ICD-10-CM | POA: Diagnosis not present

## 2023-01-08 DIAGNOSIS — I4819 Other persistent atrial fibrillation: Secondary | ICD-10-CM | POA: Diagnosis not present

## 2023-01-08 DIAGNOSIS — I5022 Chronic systolic (congestive) heart failure: Secondary | ICD-10-CM | POA: Diagnosis not present

## 2023-01-08 DIAGNOSIS — E440 Moderate protein-calorie malnutrition: Secondary | ICD-10-CM | POA: Diagnosis not present

## 2023-01-08 DIAGNOSIS — I441 Atrioventricular block, second degree: Secondary | ICD-10-CM

## 2023-01-08 LAB — CBC
HCT: 29.1 % — ABNORMAL LOW (ref 39.0–52.0)
Hemoglobin: 8.4 g/dL — ABNORMAL LOW (ref 13.0–17.0)
MCH: 22.3 pg — ABNORMAL LOW (ref 26.0–34.0)
MCHC: 28.9 g/dL — ABNORMAL LOW (ref 30.0–36.0)
MCV: 77.2 fL — ABNORMAL LOW (ref 80.0–100.0)
Platelets: 241 10*3/uL (ref 150–400)
RBC: 3.77 MIL/uL — ABNORMAL LOW (ref 4.22–5.81)
RDW: 15.9 % — ABNORMAL HIGH (ref 11.5–15.5)
WBC: 8 10*3/uL (ref 4.0–10.5)
nRBC: 0 % (ref 0.0–0.2)

## 2023-01-08 LAB — BASIC METABOLIC PANEL
Anion gap: 8 (ref 5–15)
BUN: 27 mg/dL — ABNORMAL HIGH (ref 8–23)
CO2: 24 mmol/L (ref 22–32)
Calcium: 9.5 mg/dL (ref 8.9–10.3)
Chloride: 102 mmol/L (ref 98–111)
Creatinine, Ser: 1.73 mg/dL — ABNORMAL HIGH (ref 0.61–1.24)
GFR, Estimated: 42 mL/min — ABNORMAL LOW (ref >60)
Glucose, Bld: 119 mg/dL — ABNORMAL HIGH (ref 70–99)
Potassium: 3.7 mmol/L (ref 3.5–5.1)
Sodium: 134 mmol/L — ABNORMAL LOW (ref 135–145)

## 2023-01-08 LAB — COOXEMETRY PANEL
Carboxyhemoglobin: 1.9 % — ABNORMAL HIGH (ref 0.5–1.5)
Methemoglobin: <0.7 % (ref 0.0–1.5)
O2 Saturation: 69.1 %
Total hemoglobin: 8.9 g/dL — ABNORMAL LOW (ref 12.0–16.0)

## 2023-01-08 LAB — SURGICAL PCR SCREEN
MRSA, PCR: NEGATIVE
Staphylococcus aureus: NEGATIVE

## 2023-01-08 LAB — HEPARIN LEVEL (UNFRACTIONATED): Heparin Unfractionated: >1.1 [IU]/mL — ABNORMAL HIGH (ref 0.30–0.70)

## 2023-01-08 LAB — APTT
aPTT: 50 s — ABNORMAL HIGH (ref 24–36)
aPTT: 54 s — ABNORMAL HIGH (ref 24–36)
aPTT: 63 s — ABNORMAL HIGH (ref 24–36)

## 2023-01-08 MED ORDER — POTASSIUM CHLORIDE CRYS ER 20 MEQ PO TBCR
40.0000 meq | EXTENDED_RELEASE_TABLET | Freq: Once | ORAL | Status: AC
  Administered 2023-01-08: 40 meq via ORAL
  Filled 2023-01-08: qty 2

## 2023-01-08 NOTE — Progress Notes (Addendum)
PHARMACY - ANTICOAGULATION CONSULT NOTE  Pharmacy Consult for heparin Indication: atrial fibrillation  Patient Measurements: Height: 6\' 4"  (193 cm) Weight: 90 kg (198 lb 6.6 oz) IBW/kg (Calculated) : 86.8 Heparin DW: 99.8 kg  Vital Signs: Temp: 97.8 F (36.6 C) (11/24 1930) Temp Source: Oral (11/24 1930) BP: 101/57 (11/24 2100) Pulse Rate: 69 (11/24 2100)  Labs: Recent Labs    01/06/23 0319 01/06/23 2322 01/07/23 0441 01/08/23 0441 01/08/23 0600 01/08/23 1252 01/08/23 2121  HGB 9.7* 9.3*  --  8.4*  --   --   --   HCT 32.7* 32.2*  --  29.1*  --   --   --   PLT 254 251  --  241  --   --   --   APTT  --   --   --   --  50* 54* 63*  HEPARINUNFRC  --   --   --  >1.10*  --   --   --   CREATININE 1.41* 1.59* 1.70* 1.73*  --   --   --     Estimated Creatinine Clearance: 49.5 mL/min (A) (by C-G formula based on SCr of 1.73 mg/dL (H)).   Assessment 8 you male with afib on apixaban (last dose 11/23 in the morning) and for possible ICD upgrade. Pharmacy consulted to dose heparin.  -aPTT= 63 on 1500 units/hr  Goal of Therapy:  Heparin level 0.3-0.7 units/ml aPTT 66-102 seconds Monitor platelets by anticoagulation protocol: Yes   Plan:  -Increase heparin 1600 units/hr  -Monitor aPTT and heparin levels daily until levels correlate -Daily CBC  Harland German, PharmD Clinical Pharmacist **Pharmacist phone directory can now be found on [email or web address] ([email or web address]).  Listed under [facility].

## 2023-01-08 NOTE — Progress Notes (Signed)
Progress Note   Patient: Austin [name] DOB: [birth year] DOA: 01/05/2023     3 DOS: the patient was seen and examined on 01/08/2023   Brief hospital course: [name] was admitted to the hospital with the working diagnosis of heart failure exacerbation.   67 yo male with the past medical history of heart failure, hyperlipidemia, coronary artery disease, paroxysmal atrial fibrillation and BPH who presented with worsening dyspnea for 2 to 3 days, associated with orthopnea. On her initial physical examination her blood pressure was 126/76, HR 70, RR 26 and 02 saturation 97%, lungs with no wheezing or rhonchi, heart with S1 and S2 present and regular, abdomen with no distention and no lower extremity edema.  Chest radiograph with cardiomegaly, bilateral hilar vascular congestion with bilateral pleural effusions.  CT chest with bilateral pleural effusions, emphysema, and cardiomegaly.  CT abdomen and pelvis with moderate size right pleural effusion. Mild circumferential bladder wall thickening.  Prostatomegaly.   EKG 75 bpm, left axis deviation, left bundle branch block, qtc 533, atrial sensed and ventricular paced rhythm, with no significant ST segment or T wave changes.   Patient was placed on IV furosemide for diuresis.    11/22 patient transferred to ICU, for inotropic support.  SV02 44.7, placed on milrinone infusion. 11/23 responding well to inotropic support.   Assessment and Plan: * Acute on chronic systolic CHF (congestive heart failure) (HCC) Echocardiogram with reduced LV systolic function with EF <20%, global hypokinesis, internal cavity with severe dilatation, RV systolic function preserved, LA and RA with mild dilatation, no significant valvular disease.   Improved volume status.  Systolic blood pressure is 105 to 109 mmHg.  SV02 69.1   Continue medical therapy with empagliflozin and spironolactone.  On losartan and digoxin.  Milrinone 0.25 mcg/ kg/ hr.   Furosemide 80 mg IV bid.   To consider transition to po loop diuretic therapy.   Essential hypertension Continue blood pressure control with losartan.   Atrial fibrillation (HCC) Rate control with digoxin Continue anticoagulation with apixaban.   Patient has been ventricular paced rhythm frequent PAC Started on mexiletine.  Considering under atrial sensing and reduced LV systolic function with prolonged qrs, plan for upgrade pacemaker and CRT.   Anemia Anemia of chronic disease, with stable cell count.  Malnutrition of moderate degree Continue with nutritional supplements.   Constipation 11/21 abdomen and pelvis CT with moderate of stool throughout the colon with no other acute changes.   Abdominal pain has improved with bowel regimen.         Subjective: Patient had bowel movement with improvement in his abdominal pain, dyspnea continue to improve.   Physical Exam: Vitals:   01/08/23 0645 01/08/23 0700 01/08/23 0800 01/08/23 0900  BP: 105/66 117/70  115/69  Pulse: (!) 57 60 75 (!) 44  Resp: 15 17 14 18   Temp:      TempSrc:      SpO2: 96% 99% 100% 98%  Weight:  90 kg    Height:       Neurology awake and alert ENT with no pallor or icterus Cardiovascular with S1 and S2 present with extra beats with no gallops, rubs or murmurs No JVD No lower extremity edema Respiratory with no rales or wheezing, no rhonchi Abdomen with no distention, non tender.  Data Reviewed:    Family Communication: no family at the bedside   Disposition: Status is: Inpatient Remains inpatient appropriate because: IV inotropic therapy, will need upgrade of pacemaker   Planned  Discharge Destination: Home     Author: Coralie Keens, MD 01/08/2023 10:23 AM  For on call review [email or web address].

## 2023-01-08 NOTE — Progress Notes (Signed)
Pt requests that his brother Cody Saffold be his primary contact.  His phone number is [phone].

## 2023-01-08 NOTE — Progress Notes (Signed)
Advanced Heart Failure Rounding Note  PCP-Cardiologist: None   Subjective:    Admitted with ADHF. Device interrogation suggested that he developed progressive AV block in 10/24 and RV pacing went up > 70%. On admit in SR with long AV delay causing frequent paced beats and dropped beats as well.   TLC placed. Co-ox 45%. Started on milrinone 0.25.   Yesterday was seen by Dr. Nelly Laurence and pacer adjusted. Now RV pacing with HR in 70s.   Feels much better today. Co-ox 69% on milrinone 0.25  CVP 3-4  Denies CP or SOB, Walked halls this am    Objective:   Weight Range: 90 kg Body mass index is 24.15 kg/m.   Vital Signs:   Temp:  [97.6 F (36.4 C)-98.3 F (36.8 C)] 97.6 F (36.4 C) (11/24 1146) Pulse Rate:  [31-96] 44 (11/24 0900) Resp:  [11-31] 18 (11/24 0900) BP: (96-137)/(45-78) 115/69 (11/24 0900) SpO2:  [90 %-100 %] 98 % (11/24 0900) Weight:  [90 kg] 90 kg (11/24 0700) Last BM Date : 01/07/23  Weight change: Filed Weights   01/05/23 1538 01/07/23 0500 01/08/23 0700  Weight: 96.3 kg 89.1 kg 90 kg    Intake/Output:   Intake/Output Summary (Last 24 hours) at 01/08/2023 1158 Last data filed at 01/08/2023 0900 Gross per 24 hour  Intake 481.5 ml  Output 400 ml  Net 81.5 ml      Physical Exam    General:  Sitting up in chair  No resp difficulty HEENT: normal Neck: supple. no JVD. Carotids 2+ bilat; no bruits. No lymphadenopathy or thryomegaly appreciated. Cor: PMI nondisplaced. Regular rate & rhythm. No rubs, gallops or murmurs. Lungs: clear Abdomen: soft, nontender, nondistended. No hepatosplenomegaly. No bruits or masses. Good bowel sounds. Extremities: no cyanosis, clubbing, rash, edema Neuro: alert & orientedx3, cranial nerves grossly intact. moves all 4 extremities w/o difficulty. Affect pleasant   Telemetry   SR with RV pacing at 70 Personally reviewed   Labs    CBC Recent Labs    01/06/23 2322 01/08/23 0441  WBC 8.9 8.0  HGB 9.3* 8.4*   HCT 32.2* 29.1*  MCV 78.2* 77.2*  PLT 251 241   Basic Metabolic Panel Recent Labs    25/36/64 2322 01/07/23 0441 01/08/23 0441  NA 137 138 134*  K 3.9 3.8 3.7  CL 103 101 102  CO2 23 22 24   GLUCOSE 114* 110* 119*  BUN 20 20 27*  CREATININE 1.59* 1.70* 1.73*  CALCIUM 9.6 9.7 9.5  MG 2.3 2.2  --    Liver Function Tests Recent Labs    01/06/23 0319  AST 13*  ALT 11  ALKPHOS 66  BILITOT 3.2*  PROT 6.9  ALBUMIN 3.9   No results for input(s): "LIPASE", "AMYLASE" in the last 72 hours.  Cardiac Enzymes No results for input(s): "CKTOTAL", "CKMB", "CKMBINDEX", "TROPONINI" in the last 72 hours.  BNP: BNP (last 3 results) Recent Labs    11/07/22 1429 11/21/22 0902 01/05/23 1021  BNP 1,211.3* 731.6* 1,126.0*    ProBNP (last 3 results) No results for input(s): "PROBNP" in the last 8760 hours.   D-Dimer No results for input(s): "DDIMER" in the last 72 hours. Hemoglobin A1C No results for input(s): "HGBA1C" in the last 72 hours. Fasting Lipid Panel No results for input(s): "CHOL", "HDL", "LDLCALC", "TRIG", "CHOLHDL", "LDLDIRECT" in the last 72 hours. Thyroid Function Tests No results for input(s): "TSH", "T4TOTAL", "T3FREE", "THYROIDAB" in the last 72 hours.  Invalid input(s): "FREET3"  Other  results:   Imaging    No results found.   Medications:     Scheduled Medications:  Chlorhexidine Gluconate Cloth  6 each Topical Daily   digoxin  0.125 mg Oral Daily   empagliflozin  10 mg Oral Daily   furosemide  80 mg Intravenous Q12H   losartan  25 mg Oral Daily   melatonin  6 mg Oral QHS   mexiletine  150 mg Oral Q12H   polyethylene glycol  17 g Oral Daily   pravastatin  40 mg Oral Daily   spironolactone  25 mg Oral Daily    Infusions:  heparin 1,300 Units/hr (01/08/23 0900)   milrinone 0.25 mcg/kg/min (01/08/23 0929)    PRN Medications: acetaminophen **OR** acetaminophen, HYDROmorphone (DILAUDID) injection, ondansetron **OR** ondansetron (ZOFRAN)  IV, mouth rinse    Patient Profile   67 y.o. male with history of chronic systolic CHF s/p ICD, PAF, HTN, CKD III. Admitted with acute on chronic systolic CHF.   Assessment/Plan   1. Acute on chronic Systolic Heart Failure -> cardiogenic shock - NICM  - Initially diagnosed with HFrEF in 2012. EF previously improved to 50-55% on medications.  - Echo (6/24): EF now < 20%. He does have single chamber Biotronik.  - R/LHC (7/24) w/ minimal CAD, mildly elevated filling pressures and severely reduced CO/CI by thermo 4.3/1.9.  - Suspect HF triggered by increase in RV pacing and poor atrial sensing - NYHA IV - Continue Jardiance 10 mg daily. - Continue digoxin 0.125 mg daily. - Continue spironolactone 25 mg daily  - Continue losartan 25 mg daily. - Moved to ICU on 11/22 -> co-ox 45% -> started on milrinone 0.25 - Co-ox 69% Continue milrinone. No diuretics today - ICD interrogated towith EP and looks like he developed AV block in 10/24 and RV pacing went up > 70%. Now in SR with long AV delay causing frequent paced beats and dropped beats as well.  - I think best option is to upgrade device to CRT device with RA lead as well. He is NPO for possible upgrade tomorrow - he has advanced HF. He is adamant that he does not want VAD.    2. Paroxysmal A fib  - Remains in NSR  - Eliquis switched to heparin for device upgrade   3. CKD Stage IIIa - Baseline SCr 1.2-1.4. Up to 1.7 today - Continue SGLT2i - Hold diuretics   4. ?RV Lead vegetation - possible that this is due to the "curl-i-cue" that the lead forms in the right heart, as noted on CXR.  - No vegetation on TEE 7/24   5. PVCs - High PVC burden. ~ 50% during prior admission - Not taking amio - Continue mexiletine 150 mg twice a day to help suppress PVCs  6. Empyhsema Noted on CT chest    Length of Stay: 3  Arvilla Meres, MD  01/08/2023, 11:58 AM  Advanced Heart Failure Team Pager [phone] (M-F; 7a - 5p)  Please contact  [facility] for night-coverage after hours (5p -7a ) and weekends on [email or web address]

## 2023-01-08 NOTE — Progress Notes (Signed)
PHARMACY - ANTICOAGULATION CONSULT NOTE  Pharmacy Consult for heparin Indication: atrial fibrillation  Patient Measurements: Height: 6\' 4"  (193 cm) Weight: 90 kg (198 lb 6.6 oz) IBW/kg (Calculated) : 86.8 Heparin DW: 99.8 kg  Vital Signs: Temp: 97.6 F (36.4 C) (11/24 1146) Temp Source: Oral (11/24 1146) BP: 124/67 (11/24 1300) Pulse Rate: 58 (11/24 1300)  Labs: Recent Labs    01/06/23 0319 01/06/23 2322 01/07/23 0441 01/08/23 0441 01/08/23 0600 01/08/23 1252  HGB 9.7* 9.3*  --  8.4*  --   --   HCT 32.7* 32.2*  --  29.1*  --   --   PLT 254 251  --  241  --   --   APTT  --   --   --   --  50* 54*  HEPARINUNFRC  --   --   --  >1.10*  --   --   CREATININE 1.41* 1.59* 1.70* 1.73*  --   --     Estimated Creatinine Clearance: 49.5 mL/min (A) (by C-G formula based on SCr of 1.73 mg/dL (H)).   Assessment 44 you male with afib on apixaban (last dose 11/23 in the morning) and for possible ICD upgrade. Pharmacy consulted to dose heparin.  11/24 AM: heparin level falsely elevated and aPTT 50 seconds on 1100 units/hr (subtherapeutic). Per RN, no issues with the heparin infusion running continuously or signs/symptoms of bleeding.  11/24 PM: aPTT 54 sec remains subtherapeutic after rate increase to 1300 units/hr.   Goal of Therapy:  Heparin level 0.3-0.7 units/ml aPTT 66-102 seconds Monitor platelets by anticoagulation protocol: Yes   Plan:  -Increase heparin 1500 units/hr  -aPTT in 6 hours  -Monitor aPTT and heparin levels daily until levels correlate] -Daily CBC  Trixie Rude, PharmD Clinical Pharmacist 01/08/2023  2:22 PM

## 2023-01-08 NOTE — Consult Note (Addendum)
ELECTROPHYSIOLOGY CONSULT NOTE    Patient ID: Austin [name] MRN: [id number], DOB/AGE: 10/10/1955 67 y.o.  Admit date: 01/05/2023 Date of Consult: 01/08/2023  Primary Physician: [name], NP   Patient Profile: Austin [name] is a 67 y.o. male with a history of CHFrEF who is being seen today for the evaluation of possible PVCs at the request of Dr. [name].  HPI:  Austin [name] is a 67 y.o. male  with medical history significant of  chronic HFrEF, HTN, HLD, nonobstructive CAD 2012, CKD Stage III, PAF, and single chamber Biotronik with dual DX lead. He is admitted with increased shortness of breath and volume overload that has been progressing over the prior weeks.  Telemetry 11/23 showed A-V events with relatively narrow-complex QRS followed by a wide complex beat giving the appearance of PVCs occurring in a pattern of bigeminy.  I interrogated the dual DX device. The wide complex beats were V-paced. Placing the patient in VVI mode revealed an underlying atrial rate of about 100 bpm with 2:1 conduction. In VDD mode, the device was not appropriately tracking the atrium. The p-waves following the V-paced beats were not sensed by the device.  Today, 11/24, the rhythm is sinus at about 70 bpm with 1:1 ventricular tracking and 100% V-pacing.  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   Single Chamber Biotronik   Myocardial infarction [facility]) 2012   12/31/2013   Nonischemic cardiomyopathy (HCC) 11/15/2010   cath 12/10/10 minimal nonobstructive coronary artery disease   Olecranon bursitis     Squamous cell carcinoma of arm, 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   01/05/2023   amiodarone (PACERONE) 100 MG tablet Take 1 tablet (100 mg total) by mouth daily. 30 tablet 3 Past Week   apixaban (ELIQUIS) 5 MG TABS tablet Take 5 mg by mouth 2 (two) times daily.   01/04/2023 at PM   Cholecalciferol 50 MCG (2000 UT) TABS Take 1 tablet by mouth daily.   Past Week   digoxin (LANOXIN) 0.125 MG tablet Take 1 tablet (0.125 mg total) by mouth daily. 90 tablet 3 Past Week   fenofibrate micronized (LOFIBRA) 67 MG capsule Take 67 mg by mouth every morning.   Past Week   ferrous sulfate 325 (65 FE) MG tablet Take 325 mg by mouth.   Past Week   finasteride (PROSCAR) 5 MG tablet Take 1 tablet by mouth daily.   Past Week   HYDROcodone-acetaminophen (NORCO/VICODIN) 5-325 MG tablet Take 1-2 tablets by mouth every 6 (six) hours as needed for moderate pain (pain score 4-6).   Past Week   JARDIANCE 10 MG TABS tablet Take 1 tablet (10 mg total) by mouth daily. 30 tablet 11 Past  Week   losartan (COZAAR) 25 MG tablet Take 1 tablet (25 mg total) by mouth daily. 90 tablet 3 Past Week   melatonin 3 MG TABS tablet Take 6 mg by mouth at bedtime.   Past Week   omeprazole (PRILOSEC) 20 MG capsule Take 20 mg by mouth daily.   Past Week   ondansetron (ZOFRAN) 4 MG tablet Take 4 mg by mouth daily as needed for nausea or vomiting.   Unk   pravastatin (PRAVACHOL) 40 MG tablet Take 1 tablet (40 mg total) by mouth daily. 90 tablet 2 Past Week   spironolactone (ALDACTONE) 25 MG tablet Take 1 tablet (25 mg total) by mouth daily. 90 tablet 3 Past Week   Tamsulosin HCl  (FLOMAX) 0.4 MG CAPS Take 0.4 mg by mouth daily after supper.   Past Week   torsemide (DEMADEX) 20 MG tablet Take 1 tablet (20 mg total) by mouth every Monday, Wednesday, and Friday. 90 tablet 3 Past Week   cephALEXin (KEFLEX) 500 MG capsule Take 500 mg by mouth 4 (four) times daily. (Patient not taking: Reported on 01/06/2023)   Not Taking   potassium chloride (KLOR-CON) 10 MEQ tablet Take 1 tablet (10 mEq total) by mouth every Monday, Wednesday, and Friday. (Patient not taking: Reported on 01/06/2023) 30 tablet 3 Not Taking      Physical Exam: Vitals:   01/08/23 0615 01/08/23 0630 01/08/23 0645 01/08/23 0700  BP:   105/66 117/70  Pulse: (!) 56 (!) 59 (!) 57 60  Resp: 13 14 15 17   Temp:      TempSrc:      SpO2: 94% 93% 96% 99%  Weight:      Height:        Gen: Appears comfortable, well-nourished CV: iRRR, + dependent edema Pulm: breathing easily  PERTINENT STUDIES SUMMARIZED:  Echocardiogram:      Reviewed 11/22 TTE: EF < 20%. Normal RV function. Bilateral atrial dilation   EKG:   11/21 -- AF with V-paced rhythm (personally reviewed)  TELEMETRY:  11/23  Sinus rhythm at about 100 bpm. The rhythm has the appearance of ventricular bigeminy, but upon inspection, there is a conducted beat, an A-sensed beat that is tracked by the pacemaker with a V-paced beat, then a p-wave that is not sensed by the device (personally reviewed)  11/24 A-sensed, V-paced at about 70 bpm  DEVICE HISTORY:    Biotronik single chamber ICD with dual-dx lead placed in August 2022   ASSESSMENT & PLAN:  2:1 heart block Pacing burden increased significantly (to > 90%) preceeding current CHF exacerbation The device is not appropriately sensing his atrial rhythm at higher rates CXR shows a loop of his dual DX lead in the atrium. He is often sensing the V and undersensing the A on the atrial sensing component. I think he would benefit from upgrade to CRT and placement of an atrial lead. Make NPO after  midnight. I will discuss with my colleagues in the lab tomorrow.   I discussed the indication for the procedure and the logistics, risks, potential benefit, and after care. I specifically explained that risks include but are not limited to infection, bleeding,damage to blood vessels, lung, and the heart -- but risk of prolonged hospitalization, need for surgery, or the event of stroke, heart attack, or death are low but not zero.    CHFrEF EF < 20% What appeared to be PVCs is probably V-paced events GDMT per primary  History of paroxysmal AF Remains in sinus rhythm  Hold anticoagulation for device    For questions or updates, please contact [facility] Please consult [email or web address] for contact info under Cardiology/STEMI.  Signed, York Pellant, MD 01/08/2023 7:15 AM

## 2023-01-08 NOTE — Progress Notes (Signed)
PHARMACY - ANTICOAGULATION CONSULT NOTE  Pharmacy Consult for heparin Indication: atrial fibrillation  Patient Measurements: Height: 6\' 4"  (193 cm) Weight: 89.1 kg (196 lb 6.9 oz) IBW/kg (Calculated) : 86.8  Vital Signs: Temp: 98.3 F (36.8 C) (11/23 2300) Temp Source: Oral (11/23 2300) BP: 106/63 (11/24 0600) Pulse Rate: 59 (11/24 0630)  Labs: Recent Labs    01/05/23 1021 01/05/23 1157 01/06/23 0319 01/06/23 2322 01/07/23 0441 01/08/23 0441 01/08/23 0600  HGB 9.0*  --  9.7* 9.3*  --  8.4*  --   HCT 31.1*  --  32.7* 32.2*  --  29.1*  --   PLT 240  --  254 251  --  241  --   APTT  --   --   --   --   --   --  50*  HEPARINUNFRC  --   --   --   --   --  >1.10*  --   CREATININE 1.23  --  1.41* 1.59* 1.70* 1.73*  --   TROPONINIHS 40* 33*  --   --   --   --   --     Estimated Creatinine Clearance: 49.5 mL/min (A) (by C-G formula based on SCr of 1.73 mg/dL (H)).   Assessment 11 you male with afib on apixaban (last dose 11/23 in the morning) and for possible ICD upgrade. Pharmacy consulted to dose heparin.  11/24 AM: heparin level falsely elevated and aPTT 50 seconds on 1100 units/hr (subtherapeutic). Per RN, no issues with the heparin infusion running continuously or signs/symptoms of bleeding.  Goal of Therapy:  Heparin level 0.3-0.7 units/ml aPTT 66-102 seconds Monitor platelets by anticoagulation protocol: Yes   Plan:  -Increase heparin 1300 units/hr  -aPTT in 6 hours  -Monitor aPTT and heparin levels daily until levels correlate] -Daily CBC  Arabella Merles, PharmD. Clinical Pharmacist 01/08/2023 6:36 AM

## 2023-01-08 NOTE — Plan of Care (Signed)
  Problem: Education: Goal: Knowledge of General Education information will improve Description: Including pain rating scale, medication(s)/side effects and non-pharmacologic comfort measures Outcome: Progressing   Problem: Health Behavior/Discharge Planning: Goal: Ability to manage health-related needs will improve Outcome: Progressing   Problem: Clinical Measurements: Goal: Ability to maintain clinical measurements within normal limits will improve Outcome: Progressing Goal: Will remain free from infection Outcome: Progressing Goal: Diagnostic test results will improve Outcome: Progressing Goal: Respiratory complications will improve Outcome: Progressing Goal: Cardiovascular complication will be avoided Outcome: Progressing   Problem: Activity: Goal: Capacity to carry out activities will improve Outcome: Progressing

## 2023-01-09 DIAGNOSIS — Z515 Encounter for palliative care: Secondary | ICD-10-CM | POA: Diagnosis not present

## 2023-01-09 DIAGNOSIS — I4819 Other persistent atrial fibrillation: Secondary | ICD-10-CM | POA: Diagnosis not present

## 2023-01-09 DIAGNOSIS — I10 Essential (primary) hypertension: Secondary | ICD-10-CM | POA: Diagnosis not present

## 2023-01-09 DIAGNOSIS — N1831 Chronic kidney disease, stage 3a: Secondary | ICD-10-CM | POA: Diagnosis not present

## 2023-01-09 DIAGNOSIS — Z7189 Other specified counseling: Secondary | ICD-10-CM | POA: Diagnosis not present

## 2023-01-09 DIAGNOSIS — R001 Bradycardia, unspecified: Secondary | ICD-10-CM | POA: Diagnosis not present

## 2023-01-09 DIAGNOSIS — I441 Atrioventricular block, second degree: Secondary | ICD-10-CM | POA: Diagnosis not present

## 2023-01-09 DIAGNOSIS — N1832 Chronic kidney disease, stage 3b: Secondary | ICD-10-CM

## 2023-01-09 DIAGNOSIS — E440 Moderate protein-calorie malnutrition: Secondary | ICD-10-CM | POA: Diagnosis not present

## 2023-01-09 DIAGNOSIS — I5023 Acute on chronic systolic (congestive) heart failure: Secondary | ICD-10-CM | POA: Diagnosis not present

## 2023-01-09 LAB — CBC
HCT: 29.3 % — ABNORMAL LOW (ref 39.0–52.0)
Hemoglobin: 8.4 g/dL — ABNORMAL LOW (ref 13.0–17.0)
MCH: 22.5 pg — ABNORMAL LOW (ref 26.0–34.0)
MCHC: 28.7 g/dL — ABNORMAL LOW (ref 30.0–36.0)
MCV: 78.6 fL — ABNORMAL LOW (ref 80.0–100.0)
Platelets: 232 10*3/uL (ref 150–400)
RBC: 3.73 MIL/uL — ABNORMAL LOW (ref 4.22–5.81)
RDW: 15.9 % — ABNORMAL HIGH (ref 11.5–15.5)
WBC: 5.8 10*3/uL (ref 4.0–10.5)
nRBC: 0 % (ref 0.0–0.2)

## 2023-01-09 LAB — APTT
aPTT: 80 s — ABNORMAL HIGH (ref 24–36)
aPTT: 76 s — ABNORMAL HIGH (ref 24–36)

## 2023-01-09 LAB — BASIC METABOLIC PANEL
Anion gap: 8 (ref 5–15)
BUN: 19 mg/dL (ref 8–23)
CO2: 23 mmol/L (ref 22–32)
Calcium: 9.3 mg/dL (ref 8.9–10.3)
Chloride: 103 mmol/L (ref 98–111)
Creatinine, Ser: 1.57 mg/dL — ABNORMAL HIGH (ref 0.61–1.24)
GFR, Estimated: 47 mL/min — ABNORMAL LOW (ref >60)
Glucose, Bld: 97 mg/dL (ref 70–99)
Potassium: 4.2 mmol/L (ref 3.5–5.1)
Sodium: 134 mmol/L — ABNORMAL LOW (ref 135–145)

## 2023-01-09 LAB — COOXEMETRY PANEL
Carboxyhemoglobin: 1.6 % — ABNORMAL HIGH (ref 0.5–1.5)
Methemoglobin: <0.7 % (ref 0.0–1.5)
O2 Saturation: 75.5 %
Total hemoglobin: 8.9 g/dL — ABNORMAL LOW (ref 12.0–16.0)

## 2023-01-09 LAB — GLUCOSE, CAPILLARY
Glucose-Capillary: 95 mg/dL (ref 70–99)
Glucose-Capillary: 90 mg/dL (ref 70–99)
Glucose-Capillary: 102 mg/dL — ABNORMAL HIGH (ref 70–99)
Glucose-Capillary: 88 mg/dL (ref 70–99)

## 2023-01-09 LAB — HEPARIN LEVEL (UNFRACTIONATED)
Heparin Unfractionated: 1 [IU]/mL — ABNORMAL HIGH (ref 0.30–0.70)
Heparin Unfractionated: 0.66 [IU]/mL (ref 0.30–0.70)

## 2023-01-09 MED ORDER — HEPARIN (PORCINE) 25000 UT/250ML-% IV SOLN
1600.0000 [IU]/h | INTRAVENOUS | Status: DC
  Administered 2023-01-09 – 2023-01-10 (×3): 1600 [IU]/h via INTRAVENOUS
  Filled 2023-01-09 (×2): qty 250

## 2023-01-09 NOTE — Plan of Care (Signed)
  Problem: Education: Goal: Knowledge of General Education information will improve Description: Including pain rating scale, medication(s)/side effects and non-pharmacologic comfort measures Outcome: Progressing   Problem: Health Behavior/Discharge Planning: Goal: Ability to manage health-related needs will improve Outcome: Progressing   Problem: Activity: Goal: Risk for activity intolerance will decrease Outcome: Progressing   Problem: Nutrition: Goal: Adequate nutrition will be maintained Outcome: Progressing   Problem: Coping: Goal: Level of anxiety will decrease Outcome: Progressing   Problem: Pain Management: Goal: General experience of comfort will improve Outcome: Progressing   Problem: Safety: Goal: Ability to remain free from injury will improve Outcome: Progressing   Problem: Skin Integrity: Goal: Risk for impaired skin integrity will decrease Outcome: Progressing   Problem: Activity: Goal: Capacity to carry out activities will improve Outcome: Progressing

## 2023-01-09 NOTE — Progress Notes (Signed)
Palliative Medicine Progress Note   Patient Name: Austin [name]       Date: 01/09/2023 DOB: [birth year]  Age: 67 y.o. MRN#: [id number] Attending Physician: Coralie Keens Primary Care Physician: [name], NP Admit Date: 01/05/2023  Reason for Consultation/Follow-up: {Reason for Consult:23484}  HPI/Patient Profile: 67 y.o. male  with past medical history of chronic systolic CHF s/p ICD, nonischemic cardiomyopathy, paroxysmal atrial fibrillation, CKD stage III, and hypertension who was admitted on 01/05/2023 with acute on chronic heart failure. He was transferred to ICU on 11/22 due to cardiogenic shock and started on milrinone.    Palliative Medicine was consulted for goals of care and medical decision making.   Subjective: ***  Objective:  Physical Exam          Vital Signs: BP 127/85 (BP Location: Left Arm)   Pulse 64   Temp 98.8 F (37.1 C) (Oral)   Resp 19   Ht 6\' 4"  (1.93 m)   Wt 90.6 kg   SpO2 92%   BMI 24.31 kg/m  SpO2: SpO2: 92 % O2 Device: O2 Device: Room Air O2 Flow Rate: O2 Flow Rate (L/min): 1 L/min  Intake/output summary:  Intake/Output Summary (Last 24 hours) at 01/09/2023 2150 Last data filed at 01/09/2023 2000 Gross per 24 hour  Intake 696.94 ml  Output 2550 ml  Net -1853.06 ml    LBM: Last BM Date : 01/07/23     Palliative Assessment/Data: ***     Palliative Medicine Assessment & Plan   Assessment: Principal Problem:   Acute on chronic systolic CHF (congestive heart failure) (HCC) Active Problems:   Essential hypertension   Anemia   Atrial fibrillation (HCC)   Malnutrition of moderate degree   Constipation   CKD stage 3a, GFR 45-59 ml/min (HCC)    Recommendations/Plan: ***  Goals of Care and Additional  Recommendations: Limitations on Scope of Treatment: {Recommended Scope and Preferences:21019}  Code Status:   Prognosis:  {Palliative Care Prognosis:23504}  Discharge Planning: {Palliative dispostion:23505}  Care plan was discussed with ***  Thank you for allowing the Palliative Medicine Team to assist in the care of this patient.   ***   Merry Proud, NP   Please contact Palliative Medicine Team phone at [phone] for questions and concerns.  For individual providers, please see AMION.

## 2023-01-09 NOTE — Progress Notes (Addendum)
Progress Note   Patient: Austin [name] ZOX:[id number] DOB: 1955/10/25 DOA: 01/05/2023     4 DOS: the patient was seen and examined on 01/09/2023   Brief hospital course: Austin [name] was admitted to the hospital with the working diagnosis of heart failure exacerbation.   67 yo male with the past medical history of heart failure, hyperlipidemia, coronary artery disease, paroxysmal atrial fibrillation and BPH who presented with worsening dyspnea for 2 to 3 days, associated with orthopnea. On her initial physical examination her blood pressure was 126/76, HR 70, RR 26 and 02 saturation 97%, lungs with no wheezing or rhonchi, heart with S1 and S2 present and regular, abdomen with no distention and no lower extremity edema.  Chest radiograph with cardiomegaly, bilateral hilar vascular congestion with bilateral pleural effusions.  CT chest with bilateral pleural effusions, emphysema, and cardiomegaly.  CT abdomen and pelvis with moderate size right pleural effusion. Mild circumferential bladder wall thickening.  Prostatomegaly.   EKG 75 bpm, left axis deviation, left bundle branch block, qtc 533, atrial sensed and ventricular paced rhythm, with no significant ST segment or T wave changes.   Patient was placed on IV furosemide for diuresis.    11/22 patient transferred to ICU, for inotropic support.  SV02 44.7, placed on milrinone infusion. 11/23 responding well to inotropic support.  11/24 improved volume status, plan to upgrade pacemaker to CRT and placing a atrial lead.   11/25 weaning down milrinone infusion.   Assessment and Plan: * Acute on chronic systolic CHF (congestive heart failure) (HCC) Echocardiogram with reduced LV systolic function with EF <20%, global hypokinesis, internal cavity with severe dilatation, RV systolic function preserved, LA and RA with mild dilatation, no significant valvular disease.   Urine output 2,550  Systolic blood pressure is 113 to 110 mmHg.  SV02  75.5   Continue medical therapy with empagliflozin and spironolactone.  On losartan and digoxin.  Decrease Milrinone 0.125 mcg/ kg/ hr.  Holding on loop diuretic for now.   Essential hypertension Continue blood pressure control with losartan.   Atrial fibrillation (HCC) Rate control with digoxin Continue anticoagulation with heparin drip in preparation for procedure.   Patient has been ventricular paced rhythm frequent PAC Started on mexiletine.  Considering under atrial sensing and reduced LV systolic function with prolonged qrs, plan for upgrade pacemaker and CRT.   CKD stage 3a, GFR 45-59 ml/min (HCC) Hyponatremia. (Base serum cr 1,4)   Renal function with serum cr at 1,57 with K at 4,2 and serum bicarbonate at 23.  Na 134  Mg 2.2   Plan to continue SGLT 2 inh and spironolactone.  Follow up renal function in am.   Malnutrition of moderate degree Continue with nutritional supplements.   Constipation 11/21 abdomen and pelvis CT with moderate of stool throughout the colon with no other acute changes.   Abdominal pain has improved with bowel regimen.   Anemia Anemia of chronic disease, with stable cell count.   Subjective: Patient with improved dyspnea and edema, no abdominal pain and positive bowel movements   Physical Exam: Vitals:   01/09/23 1135 01/09/23 1200 01/09/23 1230 01/09/23 1300  BP:  126/76  100/65  Pulse:  67 74 60  Resp:  20 (!) 22 19  Temp: 98.5 F (36.9 C)     TempSrc: Oral     SpO2:  100% 100% 95%  Weight:      Height:       Neurology awake and alert ENT with mild pallor Cardiovascular  with S1 and S2 present and regular with no gallops, rubs or murmurs Respiratory with no rales or wheezing, no rhonchi Abdomen with no distention  No lower extremity edema  Data Reviewed:    Family Communication: no family at the bedside   Disposition: Status is: Inpatient Remains inpatient appropriate because: IV milrinone, pending upgrade pacemaker    Planned Discharge Destination: Home     Author: Coralie Keens, MD 01/09/2023 1:28 PM  For on call review [email or web address].

## 2023-01-09 NOTE — Assessment & Plan Note (Addendum)
Hyponatremia. (Base serum cr 1,4)   Patient with improvement in volume status, at the time of his discharge his renal function has a serum cr of 1,50 with K at 4,0 and serum bicarbonate at 22.  Na 136   Continue with spironolactone, torsemide and SGLT 2 inh.  Patient on K supplementation at home.  Follow up renal function and electrolytes in 7 days as outpatient.

## 2023-01-09 NOTE — Progress Notes (Signed)
PHARMACY - ANTICOAGULATION CONSULT NOTE  Pharmacy Consult for heparin Indication: atrial fibrillation  Patient Measurements: Height: 6\' 4"  (193 cm) Weight: 90.6 kg (199 lb 11.8 oz) IBW/kg (Calculated) : 86.8 Heparin DW: 99.8 kg  Vital Signs: Temp: 98 F (36.7 C) (11/25 0750) Temp Source: Oral (11/25 0750) BP: 116/65 (11/25 0800) Pulse Rate: 69 (11/25 0800)  Labs: Recent Labs    01/06/23 2322 01/07/23 0441 01/08/23 0441 01/08/23 0600 01/08/23 1252 01/08/23 2121 01/09/23 0449  HGB 9.3*  --  8.4*  --   --   --  8.4*  HCT 32.2*  --  29.1*  --   --   --  29.3*  PLT 251  --  241  --   --   --  232  APTT  --   --   --    < > 54* 63* 80*  HEPARINUNFRC  --   --  >1.10*  --   --   --  1.00*  CREATININE 1.59* 1.70* 1.73*  --   --   --  1.57*   < > = values in this interval not displayed.    Estimated Creatinine Clearance: 54.5 mL/min (A) (by C-G formula based on SCr of 1.57 mg/dL (H)).   Assessment 8 you male with afib on apixaban (last dose 11/23 in the morning) and for possible ICD upgrade. Pharmacy consulted to dose heparin.  IV UFH was held 11/25 @0800  with initial plans for ICD upgrade, but with patient still being on milrinone this procedure was postponed until he is no longer on inotropes. IV UFH was resumed @0900  at previous rate (1600 units/hour). This AM aPTT is therapeutic at 80 sec on 1600 units/hour. No signs of bleeding/bruising per RN. Hgb and plt stable.  Goal of Therapy:  Heparin level 0.3-0.7 units/ml aPTT 66-102 seconds Monitor platelets by anticoagulation protocol: Yes   Plan:  -Continue heparin 1600 units/hr  -Monitor 8-hour aPTT and heparin level -Monitor aPTT and heparin levels daily until levels correlate -Daily CBC  Wilmer Floor, PharmD PGY2 Cardiology Pharmacy Resident **Pharmacist phone directory can now be found on [email or web address] ([email or web address]).  Listed under [facility].

## 2023-01-09 NOTE — Progress Notes (Addendum)
Advanced Heart Failure Rounding Note  PCP-Cardiologist: None   Subjective:    Admitted with ADHF. Device interrogation suggested that he developed progressive AV block in 10/24 and RV pacing went up > 70%. On admit in SR with long AV delay causing frequent paced beats and dropped beats as well.   TLC placed. Co-ox 45%. Started on milrinone 0.25.   CO-OX 75% on milrinone 0.25 mcg.  Feels better. Denies SOB.     Objective:   Weight Range: 90.6 kg Body mass index is 24.31 kg/m.   Vital Signs:   Temp:  [97.6 F (36.4 C)-98.1 F (36.7 C)] 98 F (36.7 C) (11/25 0750) Pulse Rate:  [44-79] 55 (11/25 0700) Resp:  [11-22] 18 (11/25 0700) BP: (90-127)/(46-97) 113/70 (11/25 0700) SpO2:  [79 %-100 %] 99 % (11/25 0700) Weight:  [90.6 kg] 90.6 kg (11/25 0500) Last BM Date : 01/07/23  Weight change: Filed Weights   01/07/23 0500 01/08/23 0700 01/09/23 0500  Weight: 89.1 kg 90 kg 90.6 kg    Intake/Output:   Intake/Output Summary (Last 24 hours) at 01/09/2023 0804 Last data filed at 01/09/2023 0700 Gross per 24 hour  Intake 726.33 ml  Output 2150 ml  Net -1423.67 ml    CVP 4-5   Physical Exam  General:  Well appearing. No resp difficulty HEENT: normal Neck: supple. no JVD. Carotids 2+ bilat; no bruits. No lymphadenopathy or thryomegaly appreciated. RIJ  Cor: PMI nondisplaced. Regular rate & rhythm. No rubs, gallops or murmurs. Lungs: clear Abdomen: soft, nontender, nondistended. No hepatosplenomegaly. No bruits or masses. Good bowel sounds. Extremities: no cyanosis, clubbing, rash, edema.  Neuro: alert & orientedx3, cranial nerves grossly intact. moves all 4 extremities w/o difficulty. Affect pleasant  Telemetry   SR with RV pacing  60-70s  Labs    CBC Recent Labs    01/08/23 0441 01/09/23 0449  WBC 8.0 5.8  HGB 8.4* 8.4*  HCT 29.1* 29.3*  MCV 77.2* 78.6*  PLT 241 232   Basic Metabolic Panel Recent Labs    16/10/96 2322 01/07/23 0441 01/08/23 0441  01/09/23 0449  NA 137 138 134* 134*  K 3.9 3.8 3.7 4.2  CL 103 101 102 103  CO2 23 22 24 23   GLUCOSE 114* 110* 119* 97  BUN 20 20 27* 19  CREATININE 1.59* 1.70* 1.73* 1.57*  CALCIUM 9.6 9.7 9.5 9.3  MG 2.3 2.2  --   --    Liver Function Tests No results for input(s): "AST", "ALT", "ALKPHOS", "BILITOT", "PROT", "ALBUMIN" in the last 72 hours.  No results for input(s): "LIPASE", "AMYLASE" in the last 72 hours.  Cardiac Enzymes No results for input(s): "CKTOTAL", "CKMB", "CKMBINDEX", "TROPONINI" in the last 72 hours.  BNP: BNP (last 3 results) Recent Labs    11/07/22 1429 11/21/22 0902 01/05/23 1021  BNP 1,211.3* 731.6* 1,126.0*    ProBNP (last 3 results) No results for input(s): "PROBNP" in the last 8760 hours.   D-Dimer No results for input(s): "DDIMER" in the last 72 hours. Hemoglobin A1C No results for input(s): "HGBA1C" in the last 72 hours. Fasting Lipid Panel No results for input(s): "CHOL", "HDL", "LDLCALC", "TRIG", "CHOLHDL", "LDLDIRECT" in the last 72 hours. Thyroid Function Tests No results for input(s): "TSH", "T4TOTAL", "T3FREE", "THYROIDAB" in the last 72 hours.  Invalid input(s): "FREET3"  Other results:   Imaging    No results found.   Medications:     Scheduled Medications:  Chlorhexidine Gluconate Cloth  6 each Topical Daily  digoxin  0.125 mg Oral Daily   empagliflozin  10 mg Oral Daily   losartan  25 mg Oral Daily   melatonin  6 mg Oral QHS   mexiletine  150 mg Oral Q12H   polyethylene glycol  17 g Oral Daily   pravastatin  40 mg Oral Daily   spironolactone  25 mg Oral Daily    Infusions:  milrinone 0.25 mcg/kg/min (01/09/23 0700)    PRN Medications: acetaminophen **OR** acetaminophen, HYDROmorphone (DILAUDID) injection, ondansetron **OR** ondansetron (ZOFRAN) IV, mouth rinse    Patient Profile   67 y.o. male with history of chronic systolic CHF s/p ICD, PAF, HTN, CKD III. Admitted with acute on chronic systolic CHF.    Assessment/Plan   1. Acute on chronic Systolic Heart Failure -> cardiogenic shock - NICM  - Initially diagnosed with HFrEF in 2012. EF previously improved to 50-55% on medications.  - Echo (6/24): EF now < 20%. He does have single chamber Biotronik.  - R/LHC (7/24) w/ minimal CAD, mildly elevated filling pressures and severely reduced CO/CI by thermo 4.3/1.9.  - Suspect HF triggered by increase in RV pacing and poor atrial sensing -Moved to ICU on 11/22 -> co-ox 45% -> started on milrinone 0.25.  CO-OX stable on Milrinone 0.25 mcg. Discussed with EP. Cut back milrinone 0.125 mcg. Will try to get central line prior to device upgrade. Hopefully can stop tomorrow and get device upgrade.  CVP 4-5 . Appears euvolemic.  - Continue Jardiance 10 mg daily. - Continue digoxin 0.125 mg daily. - Continue spironolactone 25 mg daily  - Continue losartan 25 mg daily. - ICD interrogated towith EP and looks like he developed AV block in 10/24 and RV pacing went up > 70%. Now in SR with long AV delay causing frequent paced beats and dropped beats as well.  EP appreciated. Plan for CRT device with RA lead as well. He is NPO  - he has advanced HF. He is adamant that he does not want VAD.    2. Paroxysmal A fib  - Remains in NSR  - Eliquis switched to heparin for device upgrade   3. CKD Stage IIIa - Baseline SCr 1.2-1.4.  - Trending back down off diuretics. 1.57 today.  - Continue SGLT2i - Hold diuretics   4. ?RV Lead vegetation - possible that this is due to the "curl-i-cue" that the lead forms in the right heart, as noted on CXR.  - No vegetation on TEE 7/24   5. PVCs - High PVC burden. ~ 50% during prior admission - Not taking amio - Continue mexiletine 150 mg twice a day to help suppress PVCs  6. Empyhsema Noted on CT chest   Resume diet. NPO after midnight.   Length of Stay: 4  Kesa Birky, NP  01/09/2023, 8:04 AM  Advanced Heart Failure Team Pager [phone] (M-F; 7a - 5p)  Please  contact [facility] for night-coverage after hours (5p -7a ) and weekends on [email or web address]

## 2023-01-09 NOTE — Progress Notes (Signed)
  Patient Name: Austin [name] Date of Encounter: 01/09/2023  Primary Cardiologist: None Electrophysiologist: None  Interval Summary   The patient is doing well today.  Pt reports he is hungry. Remains on milrinone. At this time, the patient denies chest pain, shortness of breath, or any new concerns.  Vital Signs    Vitals:   01/09/23 0730 01/09/23 0750 01/09/23 0800 01/09/23 0915  BP:   116/65   Pulse: 66  69 64  Resp: 13  (!) 21   Temp:  98 F (36.7 C)    TempSrc:  Oral    SpO2: 97%  (!) 87%   Weight:      Height:        Intake/Output Summary (Last 24 hours) at 01/09/2023 0956 Last data filed at 01/09/2023 0800 Gross per 24 hour  Intake 729.28 ml  Output 2450 ml  Net -1720.72 ml   Filed Weights   01/07/23 0500 01/08/23 0700 01/09/23 0500  Weight: 89.1 kg 90 kg 90.6 kg    Physical Exam    GEN- The patient is well appearing, alert and oriented x 3 today.   Lungs- Clear to ausculation bilaterally, normal work of breathing Cardiac- Regular rate and rhythm, no murmurs, rubs or gallops GI- soft, NT, ND, + BS Extremities- no clubbing or cyanosis. No edema  Telemetry    VP 60's with occ PVC's (personally reviewed)  Hospital Course    TIMOTH MACHIDA is a 67 y.o. male with PMH of HFrEF, HTN, HLD, nonobstructive CAD 2012, CKD Stage III, PAF, and single chamber Biotronik with dual DX lead admitted 11/21 with increased SOB, volume overload that had progressed over several weeks.   Recent pacing burden increased significantly (to > 90%) preceeding current CHF exacerbation.  Tele initially showed A-V events with relatively narrow-complex QRS followed by a wide complex beat giving the appearance of PVCs occurring in a pattern of bigeminy. Interrogation 11/24 showed the wide complex beats were V-paced. Placing the patient in VVI mode revealed an underlying atrial rate of about 100 bpm with 2:1 conduction. In VDD mode, the device was not appropriately tracking the atrium.  The p-waves following the V-paced beats were not sensed by the device.   Assessment & Plan    2:1 Heart Block LVEF <20% -ok for pt to eat, no plans for procedure until patient off milrinone  -consider possible CRT-D upgrade pending hemodynamic stability  -euvolemic on exam   CHFrEF EF < 20%. What initially appeared to be PVCs is probably V-paced events -GDMT per primary   History of paroxysmal AF -NSR -continue heparin infusion for now (was off for one hour for evaluation of timing of device)   For questions or updates, please contact [facility] Please consult [email or web address] for contact info under Cardiology/STEMI.  Signed, Canary Brim, MSN, APRN, NP-C, AGACNP-BC [facility] - Electrophysiology  01/09/2023, 9:56 AM

## 2023-01-09 NOTE — Progress Notes (Signed)
PHARMACY - ANTICOAGULATION CONSULT NOTE  Pharmacy Consult for heparin Indication: atrial fibrillation  Patient Measurements: Height: 6\' 4"  (193 cm) Weight: 90.6 kg (199 lb 11.8 oz) IBW/kg (Calculated) : 86.8 Heparin DW: 99.8 kg  Vital Signs: Temp: 99.3 F (37.4 C) (11/25 1529) Temp Source: Oral (11/25 1135) BP: 98/81 (11/25 1700) Pulse Rate: 66 (11/25 1630)  Labs: Recent Labs    01/06/23 2322 01/07/23 0441 01/08/23 0441 01/08/23 0600 01/08/23 1252 01/08/23 2121 01/09/23 0449 01/09/23 1728  HGB 9.3*  --  8.4*  --   --   --  8.4*  --   HCT 32.2*  --  29.1*  --   --   --  29.3*  --   PLT 251  --  241  --   --   --  232  --   APTT  --   --   --    < > 54* 63* 80*  --   HEPARINUNFRC  --   --  >1.10*  --   --   --  1.00* 0.66  CREATININE 1.59* 1.70* 1.73*  --   --   --  1.57*  --    < > = values in this interval not displayed.    Estimated Creatinine Clearance: 54.5 mL/min (A) (by C-G formula based on SCr of 1.57 mg/dL (H)).   Assessment 44 you male with afib on apixaban (last dose 11/23 in the morning) and for possible ICD upgrade. Pharmacy consulted to dose heparin.  IV UFH was held 11/25 @0800  with initial plans for ICD upgrade, but with patient still being on milrinone this procedure was postponed until he is no longer on inotropes. IV UFH was resumed @0900  at previous rate (1600 units/hour). This AM aPTT is therapeutic at 80 sec on 1600 units/hour. No signs of bleeding/bruising per RN. Hgb and plt stable.  11/25 PM: Heparin level 0.66 on upper end of therapeutic range, aPTT 76 sec on lower end of range on 1600 units/hr.    Goal of Therapy:  Heparin level 0.3-0.7 units/ml aPTT 66-102 seconds Monitor platelets by anticoagulation protocol: Yes   Plan:  -Continue heparin 1600 units/hr  -Monitor aPTT and heparin levels daily until levels correlate -Daily CBC  Trixie Rude, PharmD Clinical Pharmacist 01/09/2023  6:10 PM

## 2023-01-10 ENCOUNTER — Encounter (HOSPITAL_COMMUNITY)
Admission: EM | Disposition: A | Discharge status: Home / Self Care | Payer: Self-pay | Source: Home / Self Care | Attending: Internal Medicine

## 2023-01-10 DIAGNOSIS — I5023 Acute on chronic systolic (congestive) heart failure: Secondary | ICD-10-CM | POA: Diagnosis not present

## 2023-01-10 DIAGNOSIS — I429 Cardiomyopathy, unspecified: Secondary | ICD-10-CM

## 2023-01-10 DIAGNOSIS — I441 Atrioventricular block, second degree: Secondary | ICD-10-CM | POA: Diagnosis not present

## 2023-01-10 DIAGNOSIS — I5022 Chronic systolic (congestive) heart failure: Secondary | ICD-10-CM | POA: Diagnosis not present

## 2023-01-10 DIAGNOSIS — R001 Bradycardia, unspecified: Secondary | ICD-10-CM | POA: Diagnosis not present

## 2023-01-10 DIAGNOSIS — I10 Essential (primary) hypertension: Secondary | ICD-10-CM | POA: Diagnosis not present

## 2023-01-10 DIAGNOSIS — I4819 Other persistent atrial fibrillation: Secondary | ICD-10-CM | POA: Diagnosis not present

## 2023-01-10 DIAGNOSIS — N1831 Chronic kidney disease, stage 3a: Secondary | ICD-10-CM | POA: Diagnosis not present

## 2023-01-10 DIAGNOSIS — I5021 Acute systolic (congestive) heart failure: Secondary | ICD-10-CM

## 2023-01-10 HISTORY — PX: UPPER EXTREMITY VENOGRAPHY: CATH118272

## 2023-01-10 LAB — BASIC METABOLIC PANEL
Anion gap: 9 (ref 5–15)
BUN: 19 mg/dL (ref 8–23)
CO2: 21 mmol/L — ABNORMAL LOW (ref 22–32)
Calcium: 9.5 mg/dL (ref 8.9–10.3)
Chloride: 106 mmol/L (ref 98–111)
Creatinine, Ser: 1.39 mg/dL — ABNORMAL HIGH (ref 0.61–1.24)
GFR, Estimated: 55 mL/min — ABNORMAL LOW (ref >60)
Glucose, Bld: 103 mg/dL — ABNORMAL HIGH (ref 70–99)
Potassium: 4 mmol/L (ref 3.5–5.1)
Sodium: 136 mmol/L (ref 135–145)

## 2023-01-10 LAB — CBC
HCT: 31.9 % — ABNORMAL LOW (ref 39.0–52.0)
Hemoglobin: 8.9 g/dL — ABNORMAL LOW (ref 13.0–17.0)
MCH: 22 pg — ABNORMAL LOW (ref 26.0–34.0)
MCHC: 27.9 g/dL — ABNORMAL LOW (ref 30.0–36.0)
MCV: 78.8 fL — ABNORMAL LOW (ref 80.0–100.0)
Platelets: 220 10*3/uL (ref 150–400)
RBC: 4.05 MIL/uL — ABNORMAL LOW (ref 4.22–5.81)
RDW: 15.9 % — ABNORMAL HIGH (ref 11.5–15.5)
WBC: 5.4 10*3/uL (ref 4.0–10.5)
nRBC: 0 % (ref 0.0–0.2)

## 2023-01-10 LAB — COOXEMETRY PANEL
Carboxyhemoglobin: 1.5 % (ref 0.5–1.5)
Methemoglobin: 0.8 % (ref 0.0–1.5)
O2 Saturation: 69.2 %
Total hemoglobin: 9.5 g/dL — ABNORMAL LOW (ref 12.0–16.0)

## 2023-01-10 LAB — APTT: aPTT: 87 s — ABNORMAL HIGH (ref 24–36)

## 2023-01-10 LAB — GLUCOSE, CAPILLARY: Glucose-Capillary: 97 mg/dL (ref 70–99)

## 2023-01-10 LAB — HEPARIN LEVEL (UNFRACTIONATED): Heparin Unfractionated: 0.66 [IU]/mL (ref 0.30–0.70)

## 2023-01-10 SURGERY — UPPER EXTREMITY VENOGRAPHY

## 2023-01-10 MED ORDER — CEFAZOLIN SODIUM-DEXTROSE 2-4 GM/100ML-% IV SOLN
2.0000 g | INTRAVENOUS | Status: AC
  Filled 2023-01-10: qty 100

## 2023-01-10 MED ORDER — SODIUM CHLORIDE 0.9 % IV SOLN
80.0000 mg | INTRAVENOUS | Status: DC
  Filled 2023-01-10: qty 2

## 2023-01-10 MED ORDER — LIDOCAINE HCL 1 % IJ SOLN
INTRAMUSCULAR | Status: AC
  Filled 2023-01-10: qty 60

## 2023-01-10 MED ORDER — IOHEXOL 350 MG/ML SOLN
INTRAVENOUS | Status: DC | PRN
  Administered 2023-01-10: 15 mL

## 2023-01-10 MED ORDER — CEFAZOLIN SODIUM-DEXTROSE 2-4 GM/100ML-% IV SOLN
INTRAVENOUS | Status: AC
  Administered 2023-01-10: 2 g via INTRAVENOUS
  Filled 2023-01-10: qty 100

## 2023-01-10 MED ORDER — SODIUM CHLORIDE 0.9 % IV SOLN
INTRAVENOUS | Status: AC
  Filled 2023-01-10: qty 2

## 2023-01-10 MED ORDER — SODIUM CHLORIDE 0.9 % IV SOLN: INTRAVENOUS | Status: DC

## 2023-01-10 SURGICAL SUPPLY — 5 items
CABLE SURGICAL S-101-97-12 (CABLE) ×1 IMPLANT
PAD DEFIB RADIO PHYSIO CONN (PAD) ×1 IMPLANT
SHEATH 7FR PRELUDE SNAP 13 (SHEATH) IMPLANT
SHEATH 9FR PRELUDE SNAP 13 (SHEATH) IMPLANT
TRAY PACEMAKER INSERTION (PACKS) ×1 IMPLANT

## 2023-01-10 NOTE — Progress Notes (Signed)
  Patient Name: Austin [name] Date of Encounter: 01/10/2023  Primary Cardiologist: None Electrophysiologist: None  Interval Summary   The patient is doing well today.  On milrinone currently, plan is to stop today.  Pt reports he slept well.   At this time, the patient denies chest pain, shortness of breath, or any new concerns.  Vital Signs    Vitals:   01/10/23 0400 01/10/23 0500 01/10/23 0600 01/10/23 0700  BP: 114/76 91/63 92/71  102/63  Pulse: 67 (!) 55 (!) 47 (!) 48  Resp: 14 20 19 20   Temp:      TempSrc:      SpO2: 93% 94% 93% 98%  Weight:  89.8 kg    Height:        Intake/Output Summary (Last 24 hours) at 01/10/2023 0937 Last data filed at 01/10/2023 0800 Gross per 24 hour  Intake 417.11 ml  Output 2375 ml  Net -1957.89 ml   Filed Weights   01/08/23 0700 01/09/23 0500 01/10/23 0500  Weight: 90 kg 90.6 kg 89.8 kg    Physical Exam    GEN- The patient is well appearing, alert and oriented x 3 today.   Lungs- Clear to ausculation bilaterally, normal work of breathing Cardiac- Regular rate and rhythm, no murmurs, rubs or gallops GI- soft, NT, ND, + BS Extremities- no clubbing or cyanosis. No edema  Telemetry    AF, VP 60-70's, PVC's  (personally reviewed)  Hospital Course    Austin [name] is a 66 y.o. male with PMH of HFrEF, HTN, HLD, nonobstructive CAD 2012, CKD Stage III, PAF, and single chamber Biotronik with dual DX lead admitted 11/21 with increased SOB, volume overload that had progressed over several weeks. Suspect new conduction system disease leading to worsening hemodynamics.  Imaging review shows loop in ICD lead which may be causing direct trauma.  Pending CRT-D upgrade.   Assessment & Plan    2:1 Heart Block LVEF <20% -milrinone turned off am 11/26  -will review timing of possible CRT-D upgrade with MD  -keep NPO for now -euvolemic on exam   CHFrEF EF < 20%. What initially appeared to be PVCs is probably V-paced events -GDMT per  primary    History of paroxysmal AF -continue heparin for now, TBD on timing of device upgrade       For questions or updates, please contact [facility] Please consult [email or web address] for contact info under Cardiology/STEMI.  Signed, Canary Brim, MSN, APRN, NP-C, AGACNP-BC [facility] - Electrophysiology  01/10/2023, 9:41 AM

## 2023-01-10 NOTE — Progress Notes (Signed)
Progress Note   Patient: Austin [name] EPP:[id number] DOB: [birth year] DOA: 01/05/2023     5 DOS: the patient was seen and examined on 01/10/2023   Brief hospital course: Mr. Mavity was admitted to the hospital with the working diagnosis of heart failure exacerbation.   67 yo male with the past medical history of heart failure, hyperlipidemia, coronary artery disease, paroxysmal atrial fibrillation and BPH who presented with worsening dyspnea for 2 to 3 days, associated with orthopnea. On her initial physical examination her blood pressure was 126/76, HR 70, RR 26 and 02 saturation 97%, lungs with no wheezing or rhonchi, heart with S1 and S2 present and regular, abdomen with no distention and no lower extremity edema.  Chest radiograph with cardiomegaly, bilateral hilar vascular congestion with bilateral pleural effusions.  CT chest with bilateral pleural effusions, emphysema, and cardiomegaly.  CT abdomen and pelvis with moderate size right pleural effusion. Mild circumferential bladder wall thickening.  Prostatomegaly.   EKG 75 bpm, left axis deviation, left bundle branch block, qtc 533, atrial sensed and ventricular paced rhythm, with no significant ST segment or T wave changes.   Patient was placed on IV furosemide for diuresis.    11/22 patient transferred to ICU, for inotropic support.  SV02 44.7, placed on milrinone infusion. 11/23 responding well to inotropic support.  11/24 improved volume status, plan to upgrade pacemaker to CRT and placing a atrial lead.   11/25 weaning down milrinone infusion.  11/26 off milrinone today, possible upgrade CRT -D today.   Assessment and Plan: * Acute on chronic systolic CHF (congestive heart failure) (HCC) Echocardiogram with reduced LV systolic function with EF <20%, global hypokinesis, internal cavity with severe dilatation, RV systolic function preserved, LA and RA with mild dilatation, no significant valvular disease.   Urine output  2,675 ml  Systolic blood pressure is 90 to 100 mmHg.  SV02 69.2   Continue medical therapy with empagliflozin and spironolactone.  On losartan and digoxin.  11/26 off milrinone .  Holding on loop diuretic for now.  Possible removal of central line today.   Essential hypertension Continue blood pressure control with losartan.   Atrial fibrillation (HCC) Rate control with digoxin Continue anticoagulation with heparin drip in preparation for procedure.   Patient has been ventricular paced rhythm frequent PAC Started on mexiletine.  Considering under atrial sensing and reduced LV systolic function with prolonged qrs, plan for upgrade pacemaker and CRT.   CKD stage 3a, GFR 45-59 ml/min (HCC) Hyponatremia. (Base serum cr 1,4)   Renal function today with serum cr at 1,39 with K at 4,0 and serum bicarbonate at 21.  Na 136   Plan to continue SGLT 2 inh and spironolactone.  Follow up renal function in am.   Malnutrition of moderate degree Continue with nutritional supplements.   Constipation 11/21 abdomen and pelvis CT with moderate of stool throughout the colon with no other acute changes.   Abdominal pain has improved with bowel regimen.   Anemia Anemia of chronic disease, with stable cell count.        Subjective: Patient is feeling better, no dyspnea, no PND or orthopnea.   Physical Exam: Vitals:   01/10/23 0700 01/10/23 0800 01/10/23 0830 01/10/23 0900  BP: 102/63 (!) 85/63  96/65  Pulse: (!) 48 (!) 53  (!) 50  Resp: 20 16  11   Temp:   98.5 F (36.9 C)   TempSrc:   Oral   SpO2: 98% 95%  94%  Weight:  Height:       Neurology awake and alert ENT with no pallor Cardiovascular with S1 and S2 present, irregular with no gallops, rubs or murmurs No JVD  Respiratory with no rales or wheezing  Abdomen with no distention  Data Reviewed:    Family Communication: no family at the bedside   Disposition: Status is: Inpatient Remains inpatient appropriate  because: upgrade pacemaker defibrillator   Planned Discharge Destination: Home     Author: Coralie Keens, MD 01/10/2023 10:30 AM  For on call review [email or web address].

## 2023-01-10 NOTE — TOC Progression Note (Signed)
Transition of Care [facility]) - Progression Note    Patient Details  Name: Austin [name] MRN: [id number] Date of Birth: 31-Jul-1955  Transition of Care [facility]) CM/SW Contact  Nicanor Bake Phone Number: [phone] 01/10/2023, 11:28 AM  Clinical Narrative: HF CSW met with at bedside. Pt stated that he was nervous about returning home alone post surgery. CSW and pt discussed the possibility of HH services whether it be covered through insurance or out of pocket cost. Pt stated that he thinks he would feel most comfortable with that option. CSW stated that she will share with the HF team and follow up.   CSW called pts PCP to schedule his hosptial follow up appointment. His provider will contact him directly to see if they can get him worked in.    TOC will continue following.       Expected Discharge Plan: Home/Self Care Barriers to Discharge: Continued Medical Work up  Expected Discharge Plan and Services       Living arrangements for the past 2 months: Apartment                                       Social Determinants of Health (SDOH) Interventions SDOH Screenings   Food Insecurity: No Food Insecurity (01/05/2023)  Housing: Low Risk  (01/05/2023)  Transportation Needs: No Transportation Needs (01/05/2023)  Utilities: Not At Risk (01/05/2023)  Financial Resource Strain: Low Risk  (11/01/2022)   Received from [facility]  Physical Activity: Sufficiently Active (11/01/2022)   Received from [facility]  Social Connections: Socially Integrated (11/01/2022)   Received from [facility]  Stress: No Stress Concern Present (11/01/2022)   Received from [facility]  Tobacco Use: Medium Risk (01/05/2023)    Readmission Risk Interventions     No data to display

## 2023-01-10 NOTE — Plan of Care (Signed)
  Problem: Education: Goal: Knowledge of General Education information will improve Description: Including pain rating scale, medication(s)/side effects and non-pharmacologic comfort measures Outcome: Progressing   Problem: Health Behavior/Discharge Planning: Goal: Ability to manage health-related needs will improve Outcome: Progressing   Problem: Clinical Measurements: Goal: Ability to maintain clinical measurements within normal limits will improve Outcome: Progressing Goal: Will remain free from infection Outcome: Progressing Goal: Diagnostic test results will improve Outcome: Progressing Goal: Respiratory complications will improve Outcome: Progressing Goal: Cardiovascular complication will be avoided Outcome: Progressing   Problem: Activity: Goal: Risk for activity intolerance will decrease Outcome: Progressing   Problem: Nutrition: Goal: Adequate nutrition will be maintained Outcome: Progressing   Problem: Coping: Goal: Level of anxiety will decrease Outcome: Progressing   Problem: Elimination: Goal: Will not experience complications related to bowel motility Outcome: Progressing Goal: Will not experience complications related to urinary retention Outcome: Progressing   Problem: Pain Management: Goal: General experience of comfort will improve Outcome: Progressing   Problem: Safety: Goal: Ability to remain free from injury will improve Outcome: Progressing   Problem: Skin Integrity: Goal: Risk for impaired skin integrity will decrease Outcome: Progressing   Problem: Education: Goal: Ability to demonstrate management of disease process will improve Outcome: Progressing Goal: Ability to verbalize understanding of medication therapies will improve Outcome: Progressing   Problem: Activity: Goal: Capacity to carry out activities will improve Outcome: Progressing   Problem: Cardiac: Goal: Ability to achieve and maintain adequate cardiopulmonary perfusion  will improve Outcome: Progressing

## 2023-01-10 NOTE — Progress Notes (Signed)
Advanced Heart Failure Rounding Note  PCP-Cardiologist: None   Subjective:    Admitted with ADHF. Device interrogation suggested that he developed progressive AV block in 10/24 and RV pacing went up > 70%. On admit in SR with long AV delay causing frequent paced beats and dropped beats as well.   TLC placed. Co-ox 45%. Started on milrinone 0.25.   CO-OX 65% on milrinone 0.125 mcg.  Feels great. No complaints.     Objective:   Weight Range: 89.8 kg Body mass index is 24.1 kg/m.   Vital Signs:   Temp:  [98.3 F (36.8 C)-99.3 F (37.4 C)] 98.3 F (36.8 C) (11/25 2300) Pulse Rate:  [45-80] 48 (11/26 0700) Resp:  [12-25] 20 (11/26 0700) BP: (90-133)/(47-85) 102/63 (11/26 0700) SpO2:  [87 %-100 %] 98 % (11/26 0700) Weight:  [89.8 kg] 89.8 kg (11/26 0500) Last BM Date : 01/07/23  Weight change: Filed Weights   01/08/23 0700 01/09/23 0500 01/10/23 0500  Weight: 90 kg 90.6 kg 89.8 kg    Intake/Output:   Intake/Output Summary (Last 24 hours) at 01/10/2023 0800 Last data filed at 01/10/2023 0700 Gross per 24 hour  Intake 406.66 ml  Output 2375 ml  Net -1968.34 ml     CVP 4-5  Physical Exam  .General:  Well appearing. No resp difficulty HEENT: normal Neck: supple. no JVD. Carotids 2+ bilat; no bruits. No lymphadenopathy or thryomegaly appreciated. RIJ  Cor: PMI nondisplaced. Irregular  rate & rhythm. No rubs, gallops or murmurs. Lungs: Decreased in the bases.  Abdomen: soft, nontender, nondistended. No hepatosplenomegaly. No bruits or masses. Good bowel sounds. Extremities: no cyanosis, clubbing, rash, edema Neuro: alert & orientedx3, cranial nerves grossly intact. moves all 4 extremities w/o difficulty. Affect pleasant   Telemetry  V Paced  with  NSVT   Labs    CBC Recent Labs    01/09/23 0449 01/10/23 0437  WBC 5.8 5.4  HGB 8.4* 8.9*  HCT 29.3* 31.9*  MCV 78.6* 78.8*  PLT 232 220   Basic Metabolic Panel Recent Labs    45/40/98 0449  01/10/23 0437  NA 134* 136  K 4.2 4.0  CL 103 106  CO2 23 21*  GLUCOSE 97 103*  BUN 19 19  CREATININE 1.57* 1.39*  CALCIUM 9.3 9.5   Liver Function Tests No results for input(s): "AST", "ALT", "ALKPHOS", "BILITOT", "PROT", "ALBUMIN" in the last 72 hours.  No results for input(s): "LIPASE", "AMYLASE" in the last 72 hours.  Cardiac Enzymes No results for input(s): "CKTOTAL", "CKMB", "CKMBINDEX", "TROPONINI" in the last 72 hours.  BNP: BNP (last 3 results) Recent Labs    11/07/22 1429 11/21/22 0902 01/05/23 1021  BNP 1,211.3* 731.6* 1,126.0*    ProBNP (last 3 results) No results for input(s): "PROBNP" in the last 8760 hours.   D-Dimer No results for input(s): "DDIMER" in the last 72 hours. Hemoglobin A1C No results for input(s): "HGBA1C" in the last 72 hours. Fasting Lipid Panel No results for input(s): "CHOL", "HDL", "LDLCALC", "TRIG", "CHOLHDL", "LDLDIRECT" in the last 72 hours. Thyroid Function Tests No results for input(s): "TSH", "T4TOTAL", "T3FREE", "THYROIDAB" in the last 72 hours.  Invalid input(s): "FREET3"  Other results:   Imaging    No results found.   Medications:     Scheduled Medications:  Chlorhexidine Gluconate Cloth  6 each Topical Daily   digoxin  0.125 mg Oral Daily   empagliflozin  10 mg Oral Daily   losartan  25 mg Oral Daily  melatonin  6 mg Oral QHS   mexiletine  150 mg Oral Q12H   polyethylene glycol  17 g Oral Daily   pravastatin  40 mg Oral Daily   spironolactone  25 mg Oral Daily    Infusions:  heparin 1,600 Units/hr (01/10/23 0700)    PRN Medications: acetaminophen **OR** acetaminophen, HYDROmorphone (DILAUDID) injection, ondansetron **OR** ondansetron (ZOFRAN) IV, mouth rinse    Patient Profile   67 y.o. male with history of chronic systolic CHF s/p ICD, PAF, HTN, CKD III. Admitted with acute on chronic systolic CHF.   Assessment/Plan   1. Acute on chronic Systolic Heart Failure -> cardiogenic shock - NICM   - Initially diagnosed with HFrEF in 2012. EF previously improved to 50-55% on medications.  - Echo (6/24): EF now < 20%. He does have single chamber Biotronik.  - R/LHC (7/24) w/ minimal CAD, mildly elevated filling pressures and severely reduced CO/CI by thermo 4.3/1.9.  - Suspect HF triggered by increase in RV pacing and poor atrial sensing -Moved to ICU on 11/22 -> co-ox 45% -> started on milrinone 0.25.  CO-OX stable on Milrinone 0.125 mcg. Stop now. Called EP to see if can get upgrade today.  - CPV 4-5. VOlume status stable. Can remove central line once we have time for procedure.  - Continue Jardiance 10 mg daily. - Continue digoxin 0.125 mg daily. - Continue spironolactone 25 mg daily  - Continue losartan 25 mg daily. - ICD interrogated to with EP and looks like he developed AV block in 10/24 and RV pacing went up > 70%. Now in SR with long AV delay causing frequent paced beats and dropped beats as well.  EP appreciated. Plan for CRT device with RA lead as well.  - he has advanced HF. He is adamant that he does not want VAD.    2. Paroxysmal A fib  - Remains in NSR  - Eliquis switched to heparin for device upgrade   3. CKD Stage IIIa - Baseline SCr 1.2-1.4.  -Stable. 1.4 today.  - Continue SGLT2i   4. ?RV Lead vegetation - possible that this is due to the "curl-i-cue" that the lead forms in the right heart, as noted on CXR.  - No vegetation on TEE 7/24   5. PVCs - High PVC burden. ~ 50% during prior admission - Not taking amio - Continue mexiletine 150 mg twice a day to help suppress PVCs  6. Empyhsema Noted on CT chest   I have called EP. NPO until we know if can get device today.   Length of Stay: 5  Mateusz Neilan, NP  01/10/2023, 8:00 AM  Advanced Heart Failure Team Pager [phone] (M-F; 7a - 5p)  Please contact [facility] for night-coverage after hours (5p -7a ) and weekends on [email or web address]

## 2023-01-11 ENCOUNTER — Encounter (HOSPITAL_COMMUNITY): Payer: Self-pay | Admitting: Cardiology

## 2023-01-11 DIAGNOSIS — I5023 Acute on chronic systolic (congestive) heart failure: Secondary | ICD-10-CM | POA: Diagnosis not present

## 2023-01-11 DIAGNOSIS — I4819 Other persistent atrial fibrillation: Secondary | ICD-10-CM | POA: Diagnosis not present

## 2023-01-11 DIAGNOSIS — E440 Moderate protein-calorie malnutrition: Secondary | ICD-10-CM | POA: Diagnosis not present

## 2023-01-11 DIAGNOSIS — I10 Essential (primary) hypertension: Secondary | ICD-10-CM | POA: Diagnosis not present

## 2023-01-11 DIAGNOSIS — E785 Hyperlipidemia, unspecified: Secondary | ICD-10-CM

## 2023-01-11 LAB — APTT
aPTT: 34 s (ref 24–36)
aPTT: 54 s — ABNORMAL HIGH (ref 24–36)

## 2023-01-11 LAB — CBC
HCT: 32 % — ABNORMAL LOW (ref 39.0–52.0)
Hemoglobin: 9.3 g/dL — ABNORMAL LOW (ref 13.0–17.0)
MCH: 22.2 pg — ABNORMAL LOW (ref 26.0–34.0)
MCHC: 29.1 g/dL — ABNORMAL LOW (ref 30.0–36.0)
MCV: 76.6 fL — ABNORMAL LOW (ref 80.0–100.0)
Platelets: 242 10*3/uL (ref 150–400)
RBC: 4.18 MIL/uL — ABNORMAL LOW (ref 4.22–5.81)
RDW: 15.8 % — ABNORMAL HIGH (ref 11.5–15.5)
WBC: 6.5 10*3/uL (ref 4.0–10.5)
nRBC: 0 % (ref 0.0–0.2)

## 2023-01-11 LAB — BASIC METABOLIC PANEL
Anion gap: 8 (ref 5–15)
BUN: 20 mg/dL (ref 8–23)
CO2: 22 mmol/L (ref 22–32)
Calcium: 9.9 mg/dL (ref 8.9–10.3)
Chloride: 106 mmol/L (ref 98–111)
Creatinine, Ser: 1.68 mg/dL — ABNORMAL HIGH (ref 0.61–1.24)
GFR, Estimated: 44 mL/min — ABNORMAL LOW (ref >60)
Glucose, Bld: 104 mg/dL — ABNORMAL HIGH (ref 70–99)
Potassium: 4.2 mmol/L (ref 3.5–5.1)
Sodium: 136 mmol/L (ref 135–145)

## 2023-01-11 LAB — GLUCOSE, CAPILLARY: Glucose-Capillary: 94 mg/dL (ref 70–99)

## 2023-01-11 MED ORDER — SORBITOL 70 % SOLN
30.0000 mL | Freq: Once | Status: AC
  Administered 2023-01-11: 30 mL via ORAL
  Filled 2023-01-11: qty 30

## 2023-01-11 MED ORDER — HEPARIN (PORCINE) 25000 UT/250ML-% IV SOLN
1650.0000 [IU]/h | INTRAVENOUS | Status: DC
  Administered 2023-01-11: 1600 [IU]/h via INTRAVENOUS
  Administered 2023-01-12: 1650 [IU]/h via INTRAVENOUS
  Administered 2023-01-12: 1700 [IU]/h via INTRAVENOUS
  Filled 2023-01-11 (×3): qty 250

## 2023-01-11 MED ORDER — MILRINONE LACTATE IN DEXTROSE 20-5 MG/100ML-% IV SOLN
0.1250 ug/kg/min | INTRAVENOUS | Status: DC
  Administered 2023-01-11 – 2023-01-12 (×2): 0.125 ug/kg/min via INTRAVENOUS
  Filled 2023-01-11 (×2): qty 100

## 2023-01-11 NOTE — Plan of Care (Signed)

## 2023-01-11 NOTE — Progress Notes (Signed)
Advanced Heart Failure Rounding Note  PCP-Cardiologist: None   Subjective:    Admitted with ADHF. Device interrogation suggested that he developed progressive AV block in 10/24 and RV pacing went up > 70%. On admit in SR with long AV delay causing frequent paced beats and dropped beats as well.   Yesterday device upgrade cancelled due to agitation.   Tells me he feels better than he has felt in a while.  He is concerned that he hasn't had a BM in 4 days.  Denies SOB.    Objective:   Weight Range: 89.1 kg Body mass index is 23.91 kg/m.   Vital Signs:   Temp:  [97.5 F (36.4 C)-98.5 F (36.9 C)] 97.5 F (36.4 C) (11/27 0800) Pulse Rate:  [40-94] 40 (11/27 0600) Resp:  [11-31] 21 (11/27 0600) BP: (94-129)/(54-117) 96/60 (11/27 0600) SpO2:  [82 %-100 %] 98 % (11/27 0600) Weight:  [89.1 kg] 89.1 kg (11/27 0500) Last BM Date : 01/07/23  Weight change: Filed Weights   01/09/23 0500 01/10/23 0500 01/11/23 0500  Weight: 90.6 kg 89.8 kg 89.1 kg    Intake/Output:   Intake/Output Summary (Last 24 hours) at 01/11/2023 0853 Last data filed at 01/11/2023 0410 Gross per 24 hour  Intake 41.79 ml  Output 1575 ml  Net -1533.21 ml     Physical Exam  .General:  Well appearing. No resp difficulty. In bed.  HEENT: normal Neck: supple. no JVD. Carotids 2+ bilat; no bruits. No lymphadenopathy or thryomegaly appreciated. RIJ  Cor: PMI nondisplaced. Irregular  rate & rhythm. No rubs, gallops or murmurs. Lungs: Decreased in the bases.  Abdomen: soft, nontender, nondistended. No hepatosplenomegaly. No bruits or masses. Good bowel sounds. Extremities: no cyanosis, clubbing, rash, edema Neuro: alert & orientedx3, cranial nerves grossly intact. moves all 4 extremities w/o difficulty. Affect pleasant   Telemetry  V paced  with intermittent AV bock   Labs    CBC Recent Labs    01/10/23 0437 01/11/23 0231  WBC 5.4 6.5  HGB 8.9* 9.3*  HCT 31.9* 32.0*  MCV 78.8* 76.6*  PLT  220 242   Basic Metabolic Panel Recent Labs    14/78/29 0437 01/11/23 0231  NA 136 136  K 4.0 4.2  CL 106 106  CO2 21* 22  GLUCOSE 103* 104*  BUN 19 20  CREATININE 1.39* 1.68*  CALCIUM 9.5 9.9   Liver Function Tests No results for input(s): "AST", "ALT", "ALKPHOS", "BILITOT", "PROT", "ALBUMIN" in the last 72 hours.  No results for input(s): "LIPASE", "AMYLASE" in the last 72 hours.  Cardiac Enzymes No results for input(s): "CKTOTAL", "CKMB", "CKMBINDEX", "TROPONINI" in the last 72 hours.  BNP: BNP (last 3 results) Recent Labs    11/07/22 1429 11/21/22 0902 01/05/23 1021  BNP 1,211.3* 731.6* 1,126.0*    ProBNP (last 3 results) No results for input(s): "PROBNP" in the last 8760 hours.   D-Dimer No results for input(s): "DDIMER" in the last 72 hours. Hemoglobin A1C No results for input(s): "HGBA1C" in the last 72 hours. Fasting Lipid Panel No results for input(s): "CHOL", "HDL", "LDLCALC", "TRIG", "CHOLHDL", "LDLDIRECT" in the last 72 hours. Thyroid Function Tests No results for input(s): "TSH", "T4TOTAL", "T3FREE", "THYROIDAB" in the last 72 hours.  Invalid input(s): "FREET3"  Other results:   Imaging    PERIPHERAL VASCULAR CATHETERIZATION  Result Date: 01/10/2023  CONCLUSIONS:  1.  Patent left subclavian vein during venogram  2.  No early apparent complications.  3. Plan for CRT upgrade  with anesthesia support     Medications:     Scheduled Medications:  Chlorhexidine Gluconate Cloth  6 each Topical Daily   digoxin  0.125 mg Oral Daily   empagliflozin  10 mg Oral Daily   losartan  25 mg Oral Daily   melatonin  6 mg Oral QHS   mexiletine  150 mg Oral Q12H   polyethylene glycol  17 g Oral Daily   pravastatin  40 mg Oral Daily   spironolactone  25 mg Oral Daily    Infusions:    PRN Medications: acetaminophen **OR** acetaminophen, HYDROmorphone (DILAUDID) injection, ondansetron **OR** ondansetron (ZOFRAN) IV, mouth rinse    Patient  Profile   67 y.o. male with history of chronic systolic CHF s/p ICD, PAF, HTN, CKD III. Admitted with acute on chronic systolic CHF.   Assessment/Plan   1. Acute on chronic Systolic Heart Failure -> cardiogenic shock - NICM  - Initially diagnosed with HFrEF in 2012. EF previously improved to 50-55% on medications.  - Echo (6/24): EF now < 20%. He does have single chamber Biotronik.  - R/LHC (7/24) w/ minimal CAD, mildly elevated filling pressures and severely reduced CO/CI by thermo 4.3/1.9.  - Suspect HF triggered by increase in RV pacing and poor atrial sensing -Moved to ICU on 11/22 -> co-ox 45% -> started on milrinone 0.25. Yesterday CO-OX stable on 0.125 mcg Milrinone. We stopped and removed central line for device upgrade . Device upgrade delayed due to agitation.  Plan for general anesthesia on Friday. Concerned he may develop recurrent low output. We may need to add milrinone 0.125 mcg run through PIV until he gets the procedure.  -Volume status stable.  May need dose of torsemide tomorrow.  - Stop jardiance with plan for general anesthesia on Friday.  - Continue digoxin 0.125 mg daily. - Continue spironolactone 25 mg daily  - Continue losartan 25 mg daily. - ICD interrogated to with EP and looks like he developed AV block in 10/24 and RV pacing went up > 70%. Now in SR with long AV delay causing frequent paced beats and dropped beats as well.  EP appreciated. Plan for CRT device with RA lead as well.  - he has advanced HF. He is adamant that he does not want VAD.    2. Paroxysmal A fib  - Remains in NSR  - Place on Heparin and stop 11/29 at 0600.    3. CKD Stage IIIa - Baseline SCr 1.2-1.4.  -Stable.  -Creatinine 1.4>1.68. Check BMET  - Holding jardiance for procedure.    4. ?RV Lead vegetation - possible that this is due to the "curl-i-cue" that the lead forms in the right heart, as noted on CXR.  - No vegetation on TEE 7/24   5. PVCs - High PVC burden. ~ 50% during  prior admission - Not taking amio - Continue mexiletine 150 mg twice a day to help suppress PVCs  6. Empyhsema Noted on CT chest   Resume diet . Consult cardiac rehab.  NPO 11/29 at MN.   Length of Stay: 6  Romie Keeble, NP  01/11/2023, 8:53 AM  Advanced Heart Failure Team Pager [phone] (M-F; 7a - 5p)  Please contact [facility] for night-coverage after hours (5p -7a ) and weekends on [email or web address]

## 2023-01-11 NOTE — Anesthesia Preprocedure Evaluation (Addendum)
Anesthesia Evaluation  Patient identified by MRN, date of birth, ID band Patient awake    Reviewed: Allergy & Precautions, NPO status , Patient's Chart, lab work & pertinent test results  Airway Mallampati: II  TM Distance: >3 FB Neck ROM: Full    Dental no notable dental hx.    Pulmonary COPD, former smoker   breath sounds clear to auscultation       Cardiovascular hypertension, + CAD, + Past MI (2012) and +CHF (EF <20%)  + dysrhythmias Atrial Fibrillation + Cardiac Defibrillator  Rhythm:Regular Rate:Normal  HLD  TTE 01/06/2023: IMPRESSIONS     1. Left ventricular ejection fraction, by estimation, is <20%. The left  ventricle has severely decreased function. The left ventricle demonstrates  global hypokinesis. The left ventricular internal cavity size was severely  dilated. Left ventricular  diastolic function could not be evaluated.   2. Right ventricular systolic function is normal. The right ventricular  size is normal. There is normal pulmonary artery systolic pressure.   3. Left atrial size was mildly dilated.   4. Right atrial size was mildly dilated.   5. The mitral valve is normal in structure. Mild mitral valve  regurgitation. No evidence of mitral stenosis.   6. The aortic valve is grossly normal. Aortic valve regurgitation is not  visualized. No aortic stenosis is present.   7. The inferior vena cava is normal in size with greater than 50%  respiratory variability, suggesting right atrial pressure of 3 mmHg.   R/LHC 08/10/2022: Assessment: 1. Minimal CAD 2. Severe NICM EF 10% 3. Mildly elevated filling pressures with severely reduced CO by Thermo    Neuro/Psych neg Seizures    GI/Hepatic ,GERD  Medicated,,  Endo/Other    Renal/GU CRFRenal disease   BPH    Musculoskeletal   Abdominal   Peds  Hematology  (+) Blood dyscrasia, anemia Lab Results      Component                Value                Date                      WBC                      6.5                 01/11/2023                HGB                      9.3 (L)             01/11/2023                HCT                      32.0 (L)            01/11/2023                MCV                      76.6 (L)            01/11/2023                PLT  242                 01/11/2023              Anesthesia Other Findings 67 y.o. male with PMH of HFrEF, HTN, HLD, nonobstructive CAD 2012, CKD Stage III, PAF, and single chamber Biotronik with dual DX lead admitted 11/21 with increased SOB, volume overload that had progressed over several weeks. Imaging review shows loop in ICD lead which may be causing direct trauma.  Pending CRT-D upgrade.  Reproductive/Obstetrics                             Anesthesia Physical Anesthesia Plan  ASA: 4  Anesthesia Plan: General   Post-op Pain Management:    Induction: Intravenous  PONV Risk Score and Plan: 2 and Ondansetron, Dexamethasone and Treatment may vary due to age or medical condition  Airway Management Planned: Oral ETT  Additional Equipment: Arterial line  Intra-op Plan:   Post-operative Plan: Extubation in OR  Informed Consent:      Dental advisory given  Plan Discussed with: CRNA and Anesthesiologist  Anesthesia Plan Comments: (Risks of general anesthesia discussed including, but not limited to, sore throat, hoarse voice, chipped/damaged teeth, injury to vocal cords, nausea and vomiting, allergic reactions, lung infection, heart attack, stroke, and death. All questions answered. )        Anesthesia Quick Evaluation

## 2023-01-11 NOTE — Progress Notes (Signed)
PHARMACY - ANTICOAGULATION CONSULT NOTE  Pharmacy Consult for heparin Indication: atrial fibrillation  Patient Measurements: Height: 6\' 4"  (193 cm) Weight: 89.1 kg (196 lb 6.9 oz) IBW/kg (Calculated) : 86.8 Heparin DW: 99.8 kg  Vital Signs: Temp: 97.6 F (36.4 C) (11/27 1100) Temp Source: Axillary (11/27 1100) BP: 110/81 (11/27 1500) Pulse Rate: 76 (11/27 1500)  Labs: Recent Labs    01/09/23 0449 01/09/23 1728 01/10/23 0437 01/11/23 0231  HGB 8.4*  --  8.9* 9.3*  HCT 29.3*  --  31.9* 32.0*  PLT 232  --  220 242  APTT 80* 76* 87* 34  HEPARINUNFRC 1.00* 0.66 0.66  --   CREATININE 1.57*  --  1.39* 1.68*    Estimated Creatinine Clearance: 50.9 mL/min (A) (by C-G formula based on SCr of 1.68 mg/dL (H)).   Assessment 43 you male with afib on apixaban (last dose 11/23 in the morning) and for possible ICD upgrade. Pharmacy consulted to dose heparin.  Heparin held 11/26 for planned procedure > ICD upgrade - procedure canceled d/t agitation and rescheduled for 11/29  Heparin restarted 1600 uts/hr - with stop date 11/29 0600 am prior to procedure  milrinone 0.125 mcg/kg/hr also restarted until his procedure s/p HF decompensation.  No signs of bleeding/bruising. Hgb and plt stable.   Goal of Therapy:  Aptt 66-103 sec Monitor platelets by anticoagulation protocol: Yes   Plan:  -Resume heparin 1600 units/hr  -Monitor aPTT daily until levels correlate -Daily CBC   Leota Sauers Pharm.D. CPP, BCPS Clinical Pharmacist [phone] 01/11/2023 3:59 PM

## 2023-01-11 NOTE — Assessment & Plan Note (Signed)
Continue with pravastatin

## 2023-01-11 NOTE — Progress Notes (Cosign Needed)
  Patient Name: Austin [name] Date of Encounter: 01/11/2023  Primary Cardiologist: None Electrophysiologist: None  Interval Summary   The patient is doing well today.  At this time, the patient denies chest pain, shortness of breath, or any new concerns.  Vital Signs    Vitals:   01/11/23 0400 01/11/23 0410 01/11/23 0500 01/11/23 0600  BP:  118/80 109/71 96/60  Pulse: (!) 57 72 74 (!) 40  Resp: 16 17 20  (!) 21  Temp:  97.8 F (36.6 C)    TempSrc:  Oral    SpO2: 97% 94% 94% 98%  Weight:   89.1 kg   Height:        Intake/Output Summary (Last 24 hours) at 01/11/2023 0747 Last data filed at 01/11/2023 0410 Gross per 24 hour  Intake 59.46 ml  Output 1725 ml  Net -1665.54 ml   Filed Weights   01/09/23 0500 01/10/23 0500 01/11/23 0500  Weight: 90.6 kg 89.8 kg 89.1 kg    Physical Exam    GEN- The patient is well appearing, alert and oriented x 3 today.   Lungs- Clear to ausculation bilaterally, normal work of breathing Cardiac- Regular rate and rhythm, no murmurs, rubs or gallops GI- soft, NT, ND, + BS Extremities- no clubbing or cyanosis. No edema  Telemetry    VP 60-80's, noted episodes of 2:1 HB, intermittent CHB overnight (personally reviewed)  Hospital Course    Austin [name] is a 67 y.o. male with PMH of HFrEF, HTN, HLD, nonobstructive CAD 2012, CKD Stage III, PAF, and single chamber Biotronik with dual DX lead admitted 11/21 with increased SOB, volume overload that had progressed over several weeks. Suspect new conduction system disease leading to worsening hemodynamics.  Imaging review shows loop in ICD lead which may be causing direct trauma.  Pending CRT-D upgrade.   Assessment & Plan    Intermittent 2:1 HB / CHB  LVEF 20%, known Biotronik DX ICD -reviewed with Dr. Graciela Husbands and ok to proceed for Friday for BiV upgrade, will need anesthesia  -NPO after MN on Friday (order in place) -euvolemic on exam   CHFrEF  EF < 20%. What initially appeared to be  PVCs is probably V-paced events  -HF Team plans to add milrinone back peripherally for now and stop on Friday am to avoid low flow   Hx Paroxysmal AF  -continue heparin for now, stop heparin infusion at 0600 on Friday am (reviewed with pharmacy for stop time / in place)  For questions or updates, please contact [facility] Please consult [email or web address] for contact info under Cardiology/STEMI.  Signed, Canary Brim, MSN, APRN, NP-C, AGACNP-BC [facility] - Electrophysiology  01/11/2023, 9:45 AM

## 2023-01-11 NOTE — Progress Notes (Addendum)
Progress Note   Patient: Austin [name] ONG:[id number] DOB: [birth year] DOA: 01/05/2023     6 DOS: the patient was seen and examined on 01/11/2023   Brief hospital course: Mr. Stebner was admitted to the hospital with the working diagnosis of heart failure exacerbation.   67 yo male with the past medical history of heart failure, hyperlipidemia, coronary artery disease, paroxysmal atrial fibrillation and BPH who presented with worsening dyspnea for 2 to 3 days, associated with orthopnea. On her initial physical examination her blood pressure was 126/76, HR 70, RR 26 and 02 saturation 97%, lungs with no wheezing or rhonchi, heart with S1 and S2 present and regular, abdomen with no distention and no lower extremity edema.  Chest radiograph with cardiomegaly, bilateral hilar vascular congestion with bilateral pleural effusions.  CT chest with bilateral pleural effusions, emphysema, and cardiomegaly.  CT abdomen and pelvis with moderate size right pleural effusion. Mild circumferential bladder wall thickening.  Prostatomegaly.   EKG 75 bpm, left axis deviation, left bundle branch block, qtc 533, atrial sensed and ventricular paced rhythm, with no significant ST segment or T wave changes.   Patient was placed on IV furosemide for diuresis.    11/22 patient transferred to ICU, for inotropic support.  SV02 44.7, placed on milrinone infusion. 11/23 responding well to inotropic support.  11/24 improved volume status, plan to upgrade pacemaker to CRT and placing a atrial lead.   11/25 weaning down milrinone infusion.  11/26 off milrinone today. 11/27 plan to transfer patient to telemetry and have pacemaker upgrade on 11/29   Assessment and Plan: * Acute on chronic systolic CHF (congestive heart failure) (HCC) Echocardiogram with reduced LV systolic function with EF <20%, global hypokinesis, internal cavity with severe dilatation, RV systolic function preserved, LA and RA with mild dilatation, no  significant valvular disease.   Urine output 1,725 ml  Systolic blood pressure is 109 to 112 mmHg.   Continue medical therapy spironolactone, losartan and digoxin.  Holding SGLT 2 inh due to procedure.   11/26 off milrinone  11/27 resumed on low dose milrinone per peripheral access to avoid low output while he waits for his upgrade pacemaker.  Holding on loop diuretic for now.   Essential hypertension Continue blood pressure control with losartan.   Atrial fibrillation (HCC) Rate control with digoxin Continue anticoagulation with heparin drip in preparation for procedure.   Patient has been ventricular paced rhythm frequent PAC Started on mexiletine.  Considering under atrial sensing and reduced LV systolic function with prolonged qrs, plan for upgrade pacemaker and CRT.   CKD stage 3a, GFR 45-59 ml/min (HCC) Hyponatremia. (Base serum cr 1,4)   Today renal function with serum cr at 1,68 with K at 4,2 and serum bicarbonate at 22.  Na 136   Plan to continue SGLT 2 inh and spironolactone.  Holding loop diuretic therapy. Follow up renal function in am.   Malnutrition of moderate degree Continue with nutritional supplements.   Constipation 11/21 abdomen and pelvis CT with moderate of stool throughout the colon with no other acute changes.   Abdominal pain has improved with bowel regimen.   Anemia Anemia of chronic disease, with stable cell count. Hgb is 9.3 today.   Dyslipidemia Continue with pravastatin.         Subjective: Patient with no chest pain or dyspnea, no orthopnea, PND or lower extremity edema   Physical Exam: Vitals:   01/11/23 0500 01/11/23 0600 01/11/23 0800 01/11/23 0900  BP: 109/71 96/60 107/78 107/86  Pulse: 74 (!) 40 69 78  Resp: 20 (!) 21 18 (!) 21  Temp:   (!) 97.5 F (36.4 C)   TempSrc:   Oral   SpO2: 94% 98% 100% 92%  Weight: 89.1 kg     Height:       Neurology awake and alert ENT with mild pallor Cardiovascular with S1 and S2  present and regular with no gallops, rubs or murmurs Respiratory with no rales or wheezing, no rhonchi Abdomen with no distention  No lower extremity edema  Data Reviewed:    Family Communication: no family at the bedside   Disposition: Status is: Inpatient Remains inpatient appropriate because: pending upgrade pacemaker.   Planned Discharge Destination: Home   Author: Coralie Keens, MD 01/11/2023 12:01 PM  For on call review [email or web address].

## 2023-01-11 NOTE — Plan of Care (Signed)
  Problem: Education: Goal: Knowledge of General Education information will improve Description: Including pain rating scale, medication(s)/side effects and non-pharmacologic comfort measures Outcome: Progressing   Problem: Health Behavior/Discharge Planning: Goal: Ability to manage health-related needs will improve Outcome: Progressing   Problem: Clinical Measurements: Goal: Ability to maintain clinical measurements within normal limits will improve Outcome: Progressing Goal: Will remain free from infection Outcome: Progressing Goal: Diagnostic test results will improve Outcome: Progressing Goal: Respiratory complications will improve Outcome: Progressing Goal: Cardiovascular complication will be avoided Outcome: Progressing   Problem: Activity: Goal: Risk for activity intolerance will decrease Outcome: Progressing   Problem: Nutrition: Goal: Adequate nutrition will be maintained Outcome: Progressing   Problem: Coping: Goal: Level of anxiety will decrease Outcome: Progressing   Problem: Elimination: Goal: Will not experience complications related to bowel motility Outcome: Progressing Goal: Will not experience complications related to urinary retention Outcome: Progressing   Problem: Pain Management: Goal: General experience of comfort will improve Outcome: Progressing   Problem: Safety: Goal: Ability to remain free from injury will improve Outcome: Progressing   Problem: Skin Integrity: Goal: Risk for impaired skin integrity will decrease Outcome: Progressing   Problem: Education: Goal: Ability to demonstrate management of disease process will improve Outcome: Progressing Goal: Ability to verbalize understanding of medication therapies will improve Outcome: Progressing   Problem: Activity: Goal: Capacity to carry out activities will improve Outcome: Progressing   Problem: Cardiac: Goal: Ability to achieve and maintain adequate cardiopulmonary perfusion  will improve Outcome: Progressing   Problem: Education: Goal: Knowledge of cardiac device and self-care will improve Outcome: Progressing Goal: Ability to safely manage health related needs after discharge will improve Outcome: Progressing Goal: Individualized Educational Video(s) Outcome: Progressing   Problem: Cardiac: Goal: Ability to achieve and maintain adequate cardiopulmonary perfusion will improve Outcome: Progressing

## 2023-01-11 NOTE — Progress Notes (Signed)
PHARMACY - ANTICOAGULATION CONSULT NOTE  Pharmacy Consult for heparin Indication: atrial fibrillation  Patient Measurements: Height: 6\' 4"  (193 cm) Weight: 88.4 kg (194 lb 14.2 oz) IBW/kg (Calculated) : 86.8 Heparin DW: 99.8 kg  Vital Signs: Temp: 98 F (36.7 C) (11/27 1602) Temp Source: Oral (11/27 1559) BP: 113/91 (11/27 1559) Pulse Rate: 86 (11/27 1559)  Labs: Recent Labs    01/09/23 0449 01/09/23 1728 01/10/23 0437 01/11/23 0231 01/11/23 1750  HGB 8.4*  --  8.9* 9.3*  --   HCT 29.3*  --  31.9* 32.0*  --   PLT 232  --  220 242  --   APTT 80* 76* 87* 34 54*  HEPARINUNFRC 1.00* 0.66 0.66  --   --   CREATININE 1.57*  --  1.39* 1.68*  --     Estimated Creatinine Clearance: 50.9 mL/min (A) (by C-G formula based on SCr of 1.68 mg/dL (H)).   Assessment 29 you male with afib on apixaban (last dose 11/23 in the morning) and for possible ICD upgrade. Pharmacy consulted to dose heparin.  Heparin held 11/26 for planned procedure > ICD upgrade - procedure canceled d/t agitation and rescheduled for 11/29  Heparin restarted 1600 uts/hr - with stop date 11/29 0600 am prior to procedure  milrinone 0.125 mcg/kg/hr also restarted until his procedure s/p HF decompensation.  Hgb and plt stable.  PM: aPTT sub-therapeutic at 54 sec on heparin 1600 units/hr.  No issue with heparin infusion nor bleeding per discussion with RN.  Goal of Therapy:  Aptt 66-103 sec Monitor platelets by anticoagulation protocol: Yes   Plan:  -Increase heparin gtt to 1700 units/hr  -Monitor aPTT daily until levels correlate -Daily CBC  Lakesia Dahle D. Laney Potash, PharmD, BCPS, BCCCP 01/11/2023, 7:20 PM

## 2023-01-12 DIAGNOSIS — I10 Essential (primary) hypertension: Secondary | ICD-10-CM | POA: Diagnosis not present

## 2023-01-12 DIAGNOSIS — I4819 Other persistent atrial fibrillation: Secondary | ICD-10-CM | POA: Diagnosis not present

## 2023-01-12 DIAGNOSIS — N1831 Chronic kidney disease, stage 3a: Secondary | ICD-10-CM | POA: Diagnosis not present

## 2023-01-12 DIAGNOSIS — E785 Hyperlipidemia, unspecified: Secondary | ICD-10-CM

## 2023-01-12 DIAGNOSIS — I5023 Acute on chronic systolic (congestive) heart failure: Secondary | ICD-10-CM | POA: Diagnosis not present

## 2023-01-12 LAB — CBC
HCT: 31.5 % — ABNORMAL LOW (ref 39.0–52.0)
Hemoglobin: 9.1 g/dL — ABNORMAL LOW (ref 13.0–17.0)
MCH: 22 pg — ABNORMAL LOW (ref 26.0–34.0)
MCHC: 28.9 g/dL — ABNORMAL LOW (ref 30.0–36.0)
MCV: 76.1 fL — ABNORMAL LOW (ref 80.0–100.0)
Platelets: 234 10*3/uL (ref 150–400)
RBC: 4.14 MIL/uL — ABNORMAL LOW (ref 4.22–5.81)
RDW: 15.6 % — ABNORMAL HIGH (ref 11.5–15.5)
WBC: 6.8 10*3/uL (ref 4.0–10.5)
nRBC: 0 % (ref 0.0–0.2)

## 2023-01-12 LAB — GLUCOSE, CAPILLARY
Glucose-Capillary: 117 mg/dL — ABNORMAL HIGH (ref 70–99)
Glucose-Capillary: 119 mg/dL — ABNORMAL HIGH (ref 70–99)

## 2023-01-12 LAB — RENAL FUNCTION PANEL
Albumin: 4 g/dL (ref 3.5–5.0)
Anion gap: 7 (ref 5–15)
BUN: 21 mg/dL (ref 8–23)
CO2: 20 mmol/L — ABNORMAL LOW (ref 22–32)
Calcium: 9.5 mg/dL (ref 8.9–10.3)
Chloride: 107 mmol/L (ref 98–111)
Creatinine, Ser: 1.48 mg/dL — ABNORMAL HIGH (ref 0.61–1.24)
GFR, Estimated: 51 mL/min — ABNORMAL LOW (ref >60)
Glucose, Bld: 115 mg/dL — ABNORMAL HIGH (ref 70–99)
Phosphorus: 3.9 mg/dL (ref 2.5–4.6)
Potassium: 4.5 mmol/L (ref 3.5–5.1)
Sodium: 134 mmol/L — ABNORMAL LOW (ref 135–145)

## 2023-01-12 LAB — APTT
aPTT: 106 s — ABNORMAL HIGH (ref 24–36)
aPTT: 96 s — ABNORMAL HIGH (ref 24–36)

## 2023-01-12 LAB — HEPARIN LEVEL (UNFRACTIONATED): Heparin Unfractionated: 0.78 [IU]/mL — ABNORMAL HIGH (ref 0.30–0.70)

## 2023-01-12 MED ORDER — SODIUM CHLORIDE 0.9 % IV SOLN
200.0000 mg | Freq: Once | INTRAVENOUS | Status: AC
  Administered 2023-01-12: 200 mg via INTRAVENOUS
  Filled 2023-01-12: qty 10

## 2023-01-12 NOTE — Progress Notes (Signed)
Advanced Heart Failure Rounding Note  PCP-Cardiologist: None   Subjective:    Admitted with ADHF. Device interrogation suggested that he developed progressive AV block in 10/24 and RV pacing went up > 70%. On admit in SR with long AV delay causing frequent paced beats and dropped beats as well.  - Plan for device upgrade tomorrow.  - Feels well today. No complaints. Dyspnea has resolved.   Objective:   Weight Range: 88 kg Body mass index is 23.62 kg/m.   Vital Signs:   Temp:  [97.7 F (36.5 C)-99.1 F (37.3 C)] 98.5 F (36.9 C) (11/28 1210) Pulse Rate:  [56-78] 56 (11/28 1210) Resp:  [14-19] 18 (11/28 1210) BP: (105-127)/(68-89) 106/68 (11/28 1210) SpO2:  [96 %-100 %] 96 % (11/28 1210) Weight:  [88 kg] 88 kg (11/28 0336) Last BM Date : 01/11/23  Weight change: Filed Weights   01/11/23 0500 01/11/23 1559 01/12/23 0336  Weight: 89.1 kg 88.4 kg 88 kg    Intake/Output:   Intake/Output Summary (Last 24 hours) at 01/12/2023 1625 Last data filed at 01/12/2023 1225 Gross per 24 hour  Intake 976.76 ml  Output 100 ml  Net 876.76 ml     Physical Exam  .General:  Well appearing. No resp difficulty. In bed.  HEENT: normal Neck: supple. JVP 5cm Cor: PMI nondisplaced. Irregular  rate & rhythm. No rubs, gallops or murmurs. Lungs: Decreased in the bases.  Abdomen: soft, nontender, nondistended. No hepatosplenomegaly. No bruits or masses. Good bowel sounds. Extremities: no cyanosis, clubbing, rash, edema Neuro: alert & orientedx3, cranial nerves grossly intact. moves all 4 extremities w/o difficulty. Affect pleasant   Telemetry  V paced  with intermittent AV bock   Labs    CBC Recent Labs    01/11/23 0231 01/12/23 0236  WBC 6.5 6.8  HGB 9.3* 9.1*  HCT 32.0* 31.5*  MCV 76.6* 76.1*  PLT 242 234   Basic Metabolic Panel Recent Labs    47/82/95 0231 01/12/23 0236  NA 136 134*  K 4.2 4.5  CL 106 107  CO2 22 20*  GLUCOSE 104* 115*  BUN 20 21  CREATININE  1.68* 1.48*  CALCIUM 9.9 9.5  PHOS  --  3.9   Liver Function Tests Recent Labs    01/12/23 0236  ALBUMIN 4.0    No results for input(s): "LIPASE", "AMYLASE" in the last 72 hours.  Cardiac Enzymes No results for input(s): "CKTOTAL", "CKMB", "CKMBINDEX", "TROPONINI" in the last 72 hours.  BNP: BNP (last 3 results) Recent Labs    11/07/22 1429 11/21/22 0902 01/05/23 1021  BNP 1,211.3* 731.6* 1,126.0*     Other results:   Imaging    No results found.   Medications:     Scheduled Medications:  Chlorhexidine Gluconate Cloth  6 each Topical Daily   digoxin  0.125 mg Oral Daily   losartan  25 mg Oral Daily   melatonin  6 mg Oral QHS   mexiletine  150 mg Oral Q12H   polyethylene glycol  17 g Oral Daily   pravastatin  40 mg Oral Daily   spironolactone  25 mg Oral Daily    Infusions:  heparin 1,650 Units/hr (01/12/23 0801)   milrinone 0.125 mcg/kg/min (01/12/23 1223)     PRN Medications: acetaminophen **OR** acetaminophen, ondansetron **OR** ondansetron (ZOFRAN) IV, mouth rinse    Patient Profile   67 y.o. male with history of chronic systolic CHF s/p ICD, PAF, HTN, CKD III. Admitted with acute on chronic systolic CHF.  Assessment/Plan   1. Acute on chronic Systolic Heart Failure -> cardiogenic shock - NICM  - Initially diagnosed with HFrEF in 2012. EF previously improved to 50-55% on medications.  - Echo (6/24): EF now < 20%. He does have single chamber Biotronik.  - R/LHC (7/24) w/ minimal CAD, mildly elevated filling pressures and severely reduced CO/CI by thermo 4.3/1.9.  - Suspect HF triggered by increase in RV pacing and poor atrial sensing - Stop jardiance with plan for general anesthesia on Friday.  - Continue digoxin 0.125 mg daily. - Continue spironolactone 25 mg daily  - Continue losartan 25 mg daily. - ICD interrogated to with EP and looks like he developed AV block in 10/24 and RV pacing went up > 70%. Now in SR with long AV delay  causing frequent paced beats and dropped beats as well.  EP appreciated. Plan for CRT device with RA lead as well.  - he has advanced HF. He is adamant that he does not want VAD.  - Euvolemic on exam today on milrinone 0.120mcg/kg/min. Will discontinue after BiV upgrade tomorrow.    2. Paroxysmal A fib  - Remains in NSR  - Place on Heparin and stop 11/29 at 0600.    3. CKD Stage IIIa - Baseline SCr 1.2-1.4.  -sCr improving. Hold diuresis today.  - Holding jardiance for procedure.    4. ?RV Lead vegetation - possible that this is due to the "curl-i-cue" that the lead forms in the right heart, as noted on CXR.  - No vegetation on TEE 7/24   5. PVCs - High PVC burden. ~ 50% during prior admission - Not taking amio - Continue mexiletine 150 mg twice a day to help suppress PVCs  6. Empyhsema Noted on CT chest   NPO at midnight.   Length of Stay: 7  Emmary Culbreath, DO  01/12/2023, 4:25 PM  Advanced Heart Failure Team Pager [phone] (M-F; 7a - 5p)  Please contact [facility] for night-coverage after hours (5p -7a ) and weekends on [email or web address]

## 2023-01-12 NOTE — Progress Notes (Signed)
PHARMACY - ANTICOAGULATION CONSULT NOTE  Pharmacy Consult for heparin Indication: atrial fibrillation  Patient Measurements: Height: 6\' 4"  (193 cm) Weight: 88 kg (194 lb 0.1 oz) IBW/kg (Calculated) : 86.8 Heparin DW: 99.8 kg  Vital Signs: Temp: 97.8 F (36.6 C) (11/28 0336) Temp Source: Oral (11/28 0336) BP: 109/89 (11/28 0336) Pulse Rate: 73 (11/28 0336)  Labs: Recent Labs    01/09/23 1728 01/09/23 1728 01/10/23 0437 01/11/23 0231 01/11/23 1750 01/12/23 0236  HGB  --    < > 8.9* 9.3*  --  9.1*  HCT  --   --  31.9* 32.0*  --  31.5*  PLT  --   --  220 242  --  234  APTT 76*  --  87* 34 54* 106*  HEPARINUNFRC 0.66  --  0.66  --   --  0.78*  CREATININE  --   --  1.39* 1.68*  --  1.48*   < > = values in this interval not displayed.    Estimated Creatinine Clearance: 57.8 mL/min (A) (by C-G formula based on SCr of 1.48 mg/dL (H)).   Assessment 62 you male with afib on apixaban (last dose 11/23 in the morning) and for possible ICD upgrade. Pharmacy consulted to dose heparin.  aPTT 106s is slightly supratherapeutic with heparin running at 1700 units/hr. Heparin level is 0.78, is not quite correlating. Hgb (9.1) and PLTs (234) are stable. Per RN, no report of pauses, issues with the line, or signs of bleeding.   Will slightly decrease rate to return to goal range. Heparin infusion to stop tomorrow morning at 6am before EP procedure, end time is entered on heparin infusion order.   Goal of Therapy:  Aptt 66-103 sec Monitor platelets by anticoagulation protocol: Yes   Plan:  Decrease heparin gtt to 1650 units/hr Check 6 hour aPTT Monitor daily heparin level and aPTT until correlating Monitor for signs/symptoms of bleeding   Ernestene Kiel, PharmD PGY1 Pharmacy Resident  Please check AMION for all [facility] Pharmacy phone numbers After 10:00 PM, call Main Pharmacy [phone] 01/12/2023, 7:41 AM

## 2023-01-12 NOTE — Progress Notes (Addendum)
Progress Note   Patient: Austin [name] ZOX:[id number] DOB: [birth year] DOA: 01/05/2023     7 DOS: the patient was seen and examined on 01/12/2023   Brief hospital course: Mr. Homan was admitted to the hospital with the working diagnosis of heart failure exacerbation.   67 yo male with the past medical history of heart failure, hyperlipidemia, coronary artery disease, paroxysmal atrial fibrillation and BPH who presented with worsening dyspnea for 2 to 3 days, associated with orthopnea. On her initial physical examination her blood pressure was 126/76, HR 70, RR 26 and 02 saturation 97%, lungs with no wheezing or rhonchi, heart with S1 and S2 present and regular, abdomen with no distention and no lower extremity edema.  Chest radiograph with cardiomegaly, bilateral hilar vascular congestion with bilateral pleural effusions.  CT chest with bilateral pleural effusions, emphysema, and cardiomegaly.  CT abdomen and pelvis with moderate size right pleural effusion. Mild circumferential bladder wall thickening.  Prostatomegaly.   EKG 75 bpm, left axis deviation, left bundle branch block, qtc 533, atrial sensed and ventricular paced rhythm, with no significant ST segment or T wave changes.   Patient was placed on IV furosemide for diuresis.    11/22 patient transferred to ICU, for inotropic support.  SV02 44.7, placed on milrinone infusion. 11/23 responding well to inotropic support.  11/24 improved volume status, plan to upgrade pacemaker to CRT and placing a atrial lead.   11/25 weaning down milrinone infusion.  11/26 off milrinone today. 11/27 plan to transfer patient to telemetry and have pacemaker upgrade on 11/29. Milrinone per peripheral access.   11/28 pending pacemaker upgrade.   Assessment and Plan: * Acute on chronic systolic CHF (congestive heart failure) (HCC) Echocardiogram with reduced LV systolic function with EF <20%, global hypokinesis, internal cavity with severe  dilatation, RV systolic function preserved, LA and RA with mild dilatation, no significant valvular disease.   Urine output documented 350 ml  Systolic blood pressure is 106 to 115 mmHg.   Continue medical therapy spironolactone, losartan and digoxin.  Holding SGLT 2 inh due to procedure.  No Beta blockade due to low cardiac output.   11/26 off milrinone  11/27 resumed on low dose milrinone per peripheral access to avoid low output while he waits for his upgrade pacemaker.  Holding on loop diuretic for now.   Essential hypertension Continue blood pressure control with losartan.   Atrial fibrillation (HCC) Rate control with digoxin Continue anticoagulation with heparin drip in preparation for procedure.   Patient has been ventricular paced rhythm frequent PAC Started on mexiletine.  Considering under atrial sensing and reduced LV systolic function with prolonged qrs, plan for upgrade pacemaker and CRT.   CKD stage 3a, GFR 45-59 ml/min (HCC) Hyponatremia. (Base serum cr 1,4)   Euvolemic state, renal function with serum cr at 1,48 with K at 4,5 and serum bicarbonate at 20,  Na 134   Continue with spironolactone.  Holding loop diuretic therapy and SGLT 2 inh.  Follow up renal function in am.   Malnutrition of moderate degree Continue with nutritional supplements.   Constipation 11/21 abdomen and pelvis CT with moderate of stool throughout the colon with no other acute changes.   Abdominal pain has improved with bowel regimen.   Anemia Anemia of chronic disease, with stable cell count. Hgb is 9.1 today.  Iron deficiency anemia with serum iron 11, TIBC 438, transferrin saturation 3 and ferritin 17.  Add IV iron today.   Dyslipidemia Continue with pravastatin.  Subjective: Patient with moderate abdominal pain today, with no nausea or vomiting, he had bowel movement yesterday, has no dyspnea or chest pain   Physical Exam: Vitals:   01/11/23 2303 01/12/23  0336 01/12/23 0840 01/12/23 1210  BP: 127/76 109/89 115/75 106/68  Pulse: 78 73 69 (!) 56  Resp: 16 19 19 18   Temp: 98.4 F (36.9 C) 97.8 F (36.6 C) 97.7 F (36.5 C) 98.5 F (36.9 C)  TempSrc: Oral Oral Oral Oral  SpO2: 100% 100% 100% 96%  Weight:  88 kg    Height:       Neurology awake and alert ENT with mild pallor Cardiovascular with S1 and S2 present and regular with no gallops, rubs or murmurs No JVD No lower extremity edema Respiratory with no rales or wheezing, no rhonchi Abdomen with no distention  Data Reviewed:   Family Communication: no family at the bedside   Disposition: Status is: Inpatient Remains inpatient appropriate because: pending upgrade of pacemaker   Planned Discharge Destination: Home      Author: Coralie Keens, MD 01/12/2023 2:06 PM  For on call review [email or web address].

## 2023-01-12 NOTE — Plan of Care (Signed)

## 2023-01-12 NOTE — Progress Notes (Signed)
Mobility Specialist Progress Note:    01/12/23 1207  Mobility  Activity Ambulated with assistance in hallway  Level of Assistance Standby assist, set-up cues, supervision of patient - no hands on  Assistive Device None  Distance Ambulated (ft) 300 ft  Activity Response Tolerated well  Mobility Referral Yes  $Mobility charge 1 Mobility  Mobility Specialist Start Time (ACUTE ONLY) 1045  Mobility Specialist Stop Time (ACUTE ONLY) 1059  Mobility Specialist Time Calculation (min) (ACUTE ONLY) 14 min   Received pt in bed having no complaints and agreeable to mobility. Pt was asymptomatic throughout ambulation. VSS. Returned to room w/o fault. Left in chair w/ call bell in reach and all needs met.   Thompson Grayer Mobility Specialist  Please contact vis Secure Chat or  Rehab Office [phone]

## 2023-01-12 NOTE — Progress Notes (Signed)
PHARMACY - ANTICOAGULATION CONSULT NOTE  Pharmacy Consult for heparin Indication: atrial fibrillation  Patient Measurements: Height: 6\' 4"  (193 cm) Weight: 88 kg (194 lb 0.1 oz) IBW/kg (Calculated) : 86.8 Heparin DW: 99.8 kg  Vital Signs: Temp: 98.5 F (36.9 C) (11/28 1210) Temp Source: Oral (11/28 1210) BP: 106/68 (11/28 1210) Pulse Rate: 56 (11/28 1210)  Labs: Recent Labs    01/09/23 1728 01/09/23 1728 01/10/23 0437 01/11/23 0231 01/11/23 1750 01/12/23 0236 01/12/23 1525  HGB  --    < > 8.9* 9.3*  --  9.1*  --   HCT  --   --  31.9* 32.0*  --  31.5*  --   PLT  --   --  220 242  --  234  --   APTT 76*  --  87* 34 54* 106* 96*  HEPARINUNFRC 0.66  --  0.66  --   --  0.78*  --   CREATININE  --   --  1.39* 1.68*  --  1.48*  --    < > = values in this interval not displayed.    Estimated Creatinine Clearance: 57.8 mL/min (A) (by C-G formula based on SCr of 1.48 mg/dL (H)).   Assessment 24 you male with afib on apixaban (last dose 11/23 in the morning) and for possible ICD upgrade. Pharmacy consulted to dose heparin.  aPTT 96 - therapeutic   Will slightly decrease rate to return to goal range. Heparin infusion to stop tomorrow morning at 6am before EP procedure, end time is entered on heparin infusion order.   Goal of Therapy:  Aptt 66-103 sec Monitor platelets by anticoagulation protocol: Yes   Plan:  Continue heparin gtt at 1650 units/hr - stop time in  Monitor for signs/symptoms of bleeding   Calton Dach, PharmD, BCCCP Clinical Pharmacist 01/12/2023 4:10 PM

## 2023-01-13 ENCOUNTER — Encounter (HOSPITAL_COMMUNITY)
Admission: EM | Disposition: A | Discharge status: Home / Self Care | Payer: Self-pay | Source: Home / Self Care | Attending: Internal Medicine

## 2023-01-13 ENCOUNTER — Inpatient Hospital Stay (HOSPITAL_COMMUNITY): Payer: Medicare PPO | Admitting: Anesthesiology

## 2023-01-13 ENCOUNTER — Encounter (HOSPITAL_COMMUNITY): Payer: Self-pay | Admitting: Internal Medicine

## 2023-01-13 DIAGNOSIS — I509 Heart failure, unspecified: Secondary | ICD-10-CM

## 2023-01-13 DIAGNOSIS — I4819 Other persistent atrial fibrillation: Secondary | ICD-10-CM | POA: Diagnosis not present

## 2023-01-13 DIAGNOSIS — T82110A Breakdown (mechanical) of cardiac electrode, initial encounter: Secondary | ICD-10-CM

## 2023-01-13 DIAGNOSIS — I5023 Acute on chronic systolic (congestive) heart failure: Secondary | ICD-10-CM | POA: Diagnosis not present

## 2023-01-13 DIAGNOSIS — I442 Atrioventricular block, complete: Secondary | ICD-10-CM | POA: Diagnosis not present

## 2023-01-13 DIAGNOSIS — N1831 Chronic kidney disease, stage 3a: Secondary | ICD-10-CM | POA: Diagnosis not present

## 2023-01-13 DIAGNOSIS — I10 Essential (primary) hypertension: Secondary | ICD-10-CM | POA: Diagnosis not present

## 2023-01-13 DIAGNOSIS — I5022 Chronic systolic (congestive) heart failure: Secondary | ICD-10-CM | POA: Diagnosis not present

## 2023-01-13 HISTORY — PX: BIV UPGRADE: EP1202

## 2023-01-13 LAB — CBC
HCT: 31.6 % — ABNORMAL LOW (ref 39.0–52.0)
Hemoglobin: 9.3 g/dL — ABNORMAL LOW (ref 13.0–17.0)
MCH: 22.6 pg — ABNORMAL LOW (ref 26.0–34.0)
MCHC: 29.4 g/dL — ABNORMAL LOW (ref 30.0–36.0)
MCV: 76.9 fL — ABNORMAL LOW (ref 80.0–100.0)
Platelets: 213 10*3/uL (ref 150–400)
RBC: 4.11 MIL/uL — ABNORMAL LOW (ref 4.22–5.81)
RDW: 15.6 % — ABNORMAL HIGH (ref 11.5–15.5)
WBC: 7.5 10*3/uL (ref 4.0–10.5)
nRBC: 0 % (ref 0.0–0.2)

## 2023-01-13 LAB — GLUCOSE, CAPILLARY
Glucose-Capillary: 104 mg/dL — ABNORMAL HIGH (ref 70–99)
Glucose-Capillary: 94 mg/dL (ref 70–99)

## 2023-01-13 LAB — BASIC METABOLIC PANEL
Anion gap: 8 (ref 5–15)
BUN: 19 mg/dL (ref 8–23)
CO2: 20 mmol/L — ABNORMAL LOW (ref 22–32)
Calcium: 9.3 mg/dL (ref 8.9–10.3)
Chloride: 108 mmol/L (ref 98–111)
Creatinine, Ser: 1.28 mg/dL — ABNORMAL HIGH (ref 0.61–1.24)
GFR, Estimated: >60 mL/min (ref >60)
Glucose, Bld: 102 mg/dL — ABNORMAL HIGH (ref 70–99)
Potassium: 4.1 mmol/L (ref 3.5–5.1)
Sodium: 136 mmol/L (ref 135–145)

## 2023-01-13 LAB — HEPARIN LEVEL (UNFRACTIONATED): Heparin Unfractionated: 0.91 [IU]/mL — ABNORMAL HIGH (ref 0.30–0.70)

## 2023-01-13 LAB — APTT: aPTT: 171 s (ref 24–36)

## 2023-01-13 SURGERY — BIV UPGRADE
Anesthesia: General

## 2023-01-13 MED ORDER — SODIUM CHLORIDE 0.9 % IV SOLN
INTRAVENOUS | Status: AC
  Administered 2023-01-13: 80 mg
  Filled 2023-01-13: qty 2

## 2023-01-13 MED ORDER — IOHEXOL 350 MG/ML SOLN
INTRAVENOUS | Status: DC | PRN
  Administered 2023-01-13: 15 mg

## 2023-01-13 MED ORDER — ACETAMINOPHEN 325 MG PO TABS
325.0000 mg | ORAL_TABLET | ORAL | Status: DC | PRN
  Administered 2023-01-14: 650 mg via ORAL

## 2023-01-13 MED ORDER — SODIUM CHLORIDE 0.9 % IV SOLN: INTRAVENOUS | Status: DC

## 2023-01-13 MED ORDER — ONDANSETRON HCL 4 MG/2ML IJ SOLN
INTRAMUSCULAR | Status: DC | PRN
  Administered 2023-01-13 (×2): 4 mg via INTRAVENOUS

## 2023-01-13 MED ORDER — PHENYLEPHRINE 80 MCG/ML (10ML) SYRINGE FOR IV PUSH (FOR BLOOD PRESSURE SUPPORT)
PREFILLED_SYRINGE | INTRAVENOUS | Status: DC | PRN
  Administered 2023-01-13 (×4): 80 ug via INTRAVENOUS

## 2023-01-13 MED ORDER — CEFAZOLIN SODIUM-DEXTROSE 1-4 GM/50ML-% IV SOLN
1.0000 g | Freq: Four times a day (QID) | INTRAVENOUS | Status: AC
Start: 2023-01-13 — End: 2023-01-14
  Administered 2023-01-13 – 2023-01-14 (×3): 1 g via INTRAVENOUS
  Filled 2023-01-13 (×4): qty 50

## 2023-01-13 MED ORDER — MIDAZOLAM HCL 5 MG/5ML IJ SOLN
INTRAMUSCULAR | Status: DC | PRN
  Administered 2023-01-13: 1 mg via INTRAVENOUS

## 2023-01-13 MED ORDER — CHLORHEXIDINE GLUCONATE 0.12 % MT SOLN: 15.0000 mL | Freq: Once | OROMUCOSAL | Status: AC

## 2023-01-13 MED ORDER — CHLORHEXIDINE GLUCONATE 4 % EX SOLN: 60.0000 mL | Freq: Once | CUTANEOUS | Status: DC

## 2023-01-13 MED ORDER — LIDOCAINE 2% (20 MG/ML) 5 ML SYRINGE
INTRAMUSCULAR | Status: DC | PRN
  Administered 2023-01-13: 100 mg via INTRAVENOUS

## 2023-01-13 MED ORDER — PHENYLEPHRINE HCL-NACL 20-0.9 MG/250ML-% IV SOLN
INTRAVENOUS | Status: DC | PRN
  Administered 2023-01-13: 30 ug/min via INTRAVENOUS

## 2023-01-13 MED ORDER — SODIUM CHLORIDE 0.9 % IV SOLN: 80.0000 mg | INTRAVENOUS | Status: AC

## 2023-01-13 MED ORDER — CEFAZOLIN SODIUM-DEXTROSE 2-4 GM/100ML-% IV SOLN
INTRAVENOUS | Status: AC
  Filled 2023-01-13: qty 100

## 2023-01-13 MED ORDER — LIDOCAINE HCL (PF) 1 % IJ SOLN
INTRAMUSCULAR | Status: AC
  Filled 2023-01-13: qty 60

## 2023-01-13 MED ORDER — PROPOFOL 10 MG/ML IV BOLUS
INTRAVENOUS | Status: DC | PRN
  Administered 2023-01-13: 10 mg via INTRAVENOUS
  Administered 2023-01-13: 30 mg via INTRAVENOUS

## 2023-01-13 MED ORDER — LACTATED RINGERS IV SOLN
INTRAVENOUS | Status: DC
Start: 2023-01-13 — End: 2023-01-13

## 2023-01-13 MED ORDER — CEFAZOLIN SODIUM-DEXTROSE 2-4 GM/100ML-% IV SOLN
2.0000 g | INTRAVENOUS | Status: AC
  Administered 2023-01-13: 2 g via INTRAVENOUS
  Filled 2023-01-13: qty 100

## 2023-01-13 MED ORDER — ACETAMINOPHEN 325 MG PO TABS
ORAL_TABLET | ORAL | Status: AC
  Filled 2023-01-13: qty 2

## 2023-01-13 MED ORDER — CHLORHEXIDINE GLUCONATE 4 % EX SOLN
60.0000 mL | Freq: Once | CUTANEOUS | Status: AC
  Administered 2023-01-13: 4 via TOPICAL
  Filled 2023-01-13: qty 60

## 2023-01-13 MED ORDER — CHLORHEXIDINE GLUCONATE 0.12 % MT SOLN
OROMUCOSAL | Status: AC
  Administered 2023-01-13: 15 mL via OROMUCOSAL
  Filled 2023-01-13: qty 15

## 2023-01-13 MED ORDER — ROCURONIUM BROMIDE 10 MG/ML (PF) SYRINGE
PREFILLED_SYRINGE | INTRAVENOUS | Status: DC | PRN
  Administered 2023-01-13: 20 mg via INTRAVENOUS
  Administered 2023-01-13: 70 mg via INTRAVENOUS

## 2023-01-13 MED ORDER — OXYCODONE HCL 5 MG PO TABS
5.0000 mg | ORAL_TABLET | ORAL | Status: DC | PRN
  Administered 2023-01-13 – 2023-01-14 (×3): 5 mg via ORAL
  Filled 2023-01-13 (×3): qty 1

## 2023-01-13 MED ORDER — EPINEPHRINE 1 MG/10ML IJ SOSY
PREFILLED_SYRINGE | INTRAMUSCULAR | Status: DC | PRN
  Administered 2023-01-13: 5 ug via INTRAVENOUS

## 2023-01-13 MED ORDER — LIDOCAINE HCL (PF) 1 % IJ SOLN
INTRAMUSCULAR | Status: DC | PRN
  Administered 2023-01-13: 60 mL

## 2023-01-13 MED ORDER — DROPERIDOL 2.5 MG/ML IJ SOLN
INTRAMUSCULAR | Status: AC
  Filled 2023-01-13: qty 2

## 2023-01-13 MED ORDER — SUGAMMADEX SODIUM 200 MG/2ML IV SOLN
INTRAVENOUS | Status: DC | PRN
  Administered 2023-01-13: 200 mg via INTRAVENOUS
  Administered 2023-01-13: 100 mg via INTRAVENOUS

## 2023-01-13 MED ORDER — HEPARIN (PORCINE) IN NACL 1000-0.9 UT/500ML-% IV SOLN
INTRAVENOUS | Status: DC | PRN
  Administered 2023-01-13: 500 mL

## 2023-01-13 MED ORDER — FENTANYL CITRATE (PF) 250 MCG/5ML IJ SOLN
INTRAMUSCULAR | Status: DC | PRN
  Administered 2023-01-13: 50 ug via INTRAVENOUS

## 2023-01-13 MED ORDER — DROPERIDOL 2.5 MG/ML IJ SOLN
0.6250 mg | Freq: Once | INTRAMUSCULAR | Status: AC
  Administered 2023-01-13: 0.625 mg via INTRAVENOUS

## 2023-01-13 SURGICAL SUPPLY — 18 items
CABLE SURGICAL S-101-97-12 (CABLE) ×1 IMPLANT
CATH CPS DIRECT 135 DS2C020 (CATHETERS) IMPLANT
CRT ACTICOR HF QP (ICD Generator) ×1 IMPLANT
DEVICE CRT ACTICOR HF QP (ICD Generator) IMPLANT
DEVICE DISSECT PLASMABLAD 3.0S (MISCELLANEOUS) IMPLANT
KIT ESSENTIALS PG (KITS) IMPLANT
KIT MICROPUNCTURE NIT STIFF (SHEATH) IMPLANT
LEAD CAPSURE NOVUS 5076-52CM (Lead) IMPLANT
LEAD QUARTET 1458Q-86CM (Lead) IMPLANT
PAD DEFIB RADIO PHYSIO CONN (PAD) ×1 IMPLANT
PLASMABLADE 3.0S (MISCELLANEOUS) ×1
SHEATH 7FR PRELUDE SNAP 13 (SHEATH) IMPLANT
SHEATH 9.5FR PRELUDE SNAP 13 (SHEATH) IMPLANT
SLITTER AGILIS HISPRO (INSTRUMENTS) IMPLANT
TRAY PACEMAKER INSERTION (PACKS) ×1 IMPLANT
WIRE ACUITY WHISPER EDS 4648 (WIRE) IMPLANT
WIRE AMPLATZ SS-J .035X180CM (WIRE) IMPLANT
WIRE HI TORQ VERSACORE-J 145CM (WIRE) IMPLANT

## 2023-01-13 NOTE — Anesthesia Procedure Notes (Signed)
Arterial Line Insertion Start/End11/29/2024 9:50 AM, 01/13/2023 9:55 AM Performed by: Waynard Edwards, CRNA, CRNA  Patient location: Pre-op. Preanesthetic checklist: patient identified, IV checked, site marked, risks and benefits discussed, surgical consent, monitors and equipment checked, pre-op evaluation, timeout performed and anesthesia consent Lidocaine 1% used for infiltration Left, radial was placed Catheter size: 20 G Hand hygiene performed , maximum sterile barriers used  and Seldinger technique used Allen's test indicative of satisfactory collateral circulation Attempts: 1 Procedure performed without using ultrasound guided technique. Following insertion, dressing applied and Biopatch. Post procedure assessment: unchanged  Patient tolerated the procedure well with no immediate complications.

## 2023-01-13 NOTE — Anesthesia Postprocedure Evaluation (Signed)
Anesthesia Post Note  Patient: Austin [name]  Procedure(s) Performed: BIV UPGRADE     Patient location during evaluation: PACU Anesthesia Type: General Level of consciousness: awake and alert Pain management: pain level controlled Vital Signs Assessment: post-procedure vital signs reviewed and stable Respiratory status: spontaneous breathing, nonlabored ventilation, respiratory function stable and patient connected to nasal cannula oxygen Cardiovascular status: blood pressure returned to baseline and stable : moderate PONV in pacu, now improved. Anesthetic complications: no   There were no known notable events for this encounter.  Last Vitals:  Vitals:   01/13/23 1345 01/13/23 1417  BP: 116/79 106/73  Pulse: 70 70  Resp: 16 16  Temp:  36.6 C  SpO2: 97% 93%    Last Pain:  Vitals:   01/13/23 1417  TempSrc: Oral  PainSc:                  Mariann Barter

## 2023-01-13 NOTE — TOC Progression Note (Signed)
Transition of Care [facility]) - Progression Note    Patient Details  Name: Austin [name] MRN: [id number] Date of Birth: Jun 22, 1955  Transition of Care [facility]) CM/SW Contact  Nicanor Bake Phone Number: [phone] 01/13/2023, 2:40 PM  Clinical Narrative:   1:24 PM- HF CSW attempted to meet with pt at bedside. Pt was not in the room at the time. CSW will attempt to follow up with pt a more appropriate time.   TOC will continue following.     Expected Discharge Plan: Home/Self Care Barriers to Discharge: Continued Medical Work up  Expected Discharge Plan and Services       Living arrangements for the past 2 months: Apartment                                       Social Determinants of Health (SDOH) Interventions SDOH Screenings   Food Insecurity: No Food Insecurity (01/05/2023)  Housing: Low Risk  (01/05/2023)  Transportation Needs: No Transportation Needs (01/05/2023)  Utilities: Not At Risk (01/05/2023)  Financial Resource Strain: Low Risk  (11/01/2022)   Received from [facility]  Physical Activity: Sufficiently Active (11/01/2022)   Received from [facility]  Social Connections: Socially Integrated (11/01/2022)   Received from [facility]  Stress: No Stress Concern Present (11/01/2022)   Received from [facility]  Tobacco Use: Medium Risk (01/13/2023)    Readmission Risk Interventions     No data to display

## 2023-01-13 NOTE — Progress Notes (Addendum)
Advanced Heart Failure Rounding Note  PCP-Cardiologist: None   Subjective:   Admitted with ADHF. Device interrogation suggested that he developed progressive AV block in 10/24 and RV pacing went up > 70%. On admit in SR with long AV delay causing frequent paced beats and dropped beats as well.   - Device upgrade today - Feels fine just resting in bed. Milrinone and heparin gtt stopped this morning. Walked around yesterday, no complaints. Denies CP/SOB.   Objective:   Weight Range: 88.3 kg Body mass index is 23.7 kg/m.   Vital Signs:   Temp:  [97.6 F (36.4 C)-98.5 F (36.9 C)] 97.6 F (36.4 C) (11/29 0500) Pulse Rate:  [56-75] 68 (11/29 0500) Resp:  [18-19] 18 (11/29 0500) BP: (91-115)/(57-80) 91/71 (11/29 0500) SpO2:  [96 %-100 %] 100 % (11/29 0500) Weight:  [88.3 kg] 88.3 kg (11/29 0500) Last BM Date : 01/11/23  Weight change: Filed Weights   01/11/23 1559 01/12/23 0336 01/13/23 0500  Weight: 88.4 kg 88 kg 88.3 kg    Intake/Output:   Intake/Output Summary (Last 24 hours) at 01/13/2023 0712 Last data filed at 01/13/2023 0516 Gross per 24 hour  Intake 1226.61 ml  Output 1725 ml  Net -498.39 ml     Physical Exam  General:  elderly appearing.  No respiratory difficulty HEENT: normal Neck: supple. JVD ~7 cm. Carotids 2+ bilat; no bruits. No lymphadenopathy or thyromegaly appreciated. Cor: PMI nondisplaced. Regular rate & rhythm. No rubs, gallops or murmurs. Lungs: clear Abdomen: soft, nontender, nondistended. No hepatosplenomegaly. No bruits or masses. Good bowel sounds. Extremities: no cyanosis, clubbing, rash, edema  Neuro: alert & oriented x 3, cranial nerves grossly intact. moves all 4 extremities w/o difficulty. Affect pleasant.   Telemetry  V paced 70 (Personally reviewed)   Labs    CBC Recent Labs    01/12/23 0236 01/13/23 0251  WBC 6.8 7.5  HGB 9.1* 9.3*  HCT 31.5* 31.6*  MCV 76.1* 76.9*  PLT 234 213   Basic Metabolic Panel Recent Labs     01/12/23 0236 01/13/23 0251  NA 134* 136  K 4.5 4.1  CL 107 108  CO2 20* 20*  GLUCOSE 115* 102*  BUN 21 19  CREATININE 1.48* 1.28*  CALCIUM 9.5 9.3  PHOS 3.9  --    Liver Function Tests Recent Labs    01/12/23 0236  ALBUMIN 4.0    No results for input(s): "LIPASE", "AMYLASE" in the last 72 hours.  Cardiac Enzymes No results for input(s): "CKTOTAL", "CKMB", "CKMBINDEX", "TROPONINI" in the last 72 hours.  BNP: BNP (last 3 results) Recent Labs    11/07/22 1429 11/21/22 0902 01/05/23 1021  BNP 1,211.3* 731.6* 1,126.0*   Other results:  Imaging   No results found.  Medications:   Scheduled Medications:  chlorhexidine  60 mL Topical Once   Chlorhexidine Gluconate Cloth  6 each Topical Daily   digoxin  0.125 mg Oral Daily   gentamicin (GARAMYCIN) 80 mg in sodium chloride 0.9 % 500 mL irrigation  80 mg Irrigation To SSTC   losartan  25 mg Oral Daily   melatonin  6 mg Oral QHS   mexiletine  150 mg Oral Q12H   polyethylene glycol  17 g Oral Daily   pravastatin  40 mg Oral Daily   spironolactone  25 mg Oral Daily    Infusions:  sodium chloride 50 mL/hr at 01/13/23 0628   sodium chloride 50 mL/hr at 01/13/23 0626    ceFAZolin (ANCEF) IV  PRN Medications: acetaminophen **OR** acetaminophen, ondansetron **OR** ondansetron (ZOFRAN) IV, mouth rinse  Patient Profile   67 y.o. male with history of chronic systolic CHF s/p ICD, PAF, HTN, CKD III. Admitted with acute on chronic systolic CHF.   Assessment/Plan  1. Acute on chronic Systolic Heart Failure -> cardiogenic shock - NICM  - Initially diagnosed with HFrEF in 2012. EF previously improved to 50-55% on medications.  - Echo (6/24): EF now < 20%. He does have single chamber Biotronik.  - R/LHC (7/24) w/ minimal CAD, mildly elevated filling pressures and severely reduced CO/CI by thermo 4.3/1.9.  - Suspect HF triggered by increase in RV pacing and poor atrial sensing - Holding jardiance with plan for  general anesthesia today.  - Continue digoxin 0.125 mg daily. - Continue spironolactone 25 mg daily  - Continue losartan 25 mg daily. - ICD interrogated too with EP and looks like he developed AV block in 10/24 and RV pacing went up > 70%. Now in SR with long AV delay causing frequent paced beats and dropped beats as well.  EP appreciated. Plan for CRT device with RA lead as well.  - He has advanced HF. He is adamant that he does not want VAD.  - Euvolemic on exam today on milrinone 0.178mcg/kg/min. Stop milrinone.    2. Paroxysmal A fib  - Remains in NSR  - Heparin held pre procedure since 0600.    3. CKD Stage IIIa - Baseline SCr 1.2-1.4.  - sCr improving. Hold diuresis today.  - Holding jardiance for procedure.    4. ?RV Lead vegetation - possible that this is due to the "curl-i-cue" that the lead forms in the right heart, as noted on CXR.  - No vegetation on TEE 7/24   5. PVCs - High PVC burden. ~ 50% during prior admission - Not taking amio - Continue mexiletine 150 mg twice a day to help suppress PVCs  6. Empyhsema - Noted on CT chest   NPO for device upgrade today. Stable off milrinone.  Length of Stay: 8  Alen Bleacher, NP  01/13/2023, 7:12 AM  Advanced Heart Failure Team Pager [phone] (M-F; 7a - 5p)  Please contact [facility] for night-coverage after hours (5p -7a ) and weekends on [email or web address]   Patient seen with PA/NP, agree with the above note.   A/P - Plan for device upgrade today. D/C tomorrow AM.    Dorthula Nettles Advanced Heart Failure

## 2023-01-13 NOTE — Anesthesia Procedure Notes (Addendum)
Procedure Name: Intubation Date/Time: 01/13/2023 10:47 AM  Performed by: Halina Andreas, CRNAPre-anesthesia Checklist: Patient identified, Emergency Drugs available, Suction available, Patient being monitored and Timeout performed Patient Re-evaluated:Patient Re-evaluated prior to induction Oxygen Delivery Method: Circle system utilized Preoxygenation: Pre-oxygenation with 100% oxygen Induction Type: IV induction Ventilation: Mask ventilation without difficulty Laryngoscope Size: Miller and 2 Grade View: Grade I Tube type: Oral Tube size: 7.5 mm Number of attempts: 1 Airway Equipment and Method: Stylet Placement Confirmation: ETT inserted through vocal cords under direct vision, positive ETCO2 and breath sounds checked- equal and bilateral Secured at: 23 cm Tube secured with: Tape Dental Injury: Teeth and Oropharynx as per pre-operative assessment

## 2023-01-13 NOTE — Discharge Instructions (Addendum)
After Your ICD (Implantable Cardiac Defibrillator)   You have a Biotronik ICD  ACTIVITY Do not lift your arm above shoulder height for 1 week after your procedure. After 7 days, you may progress as below.  You should remove your sling 24 hours after your procedure, unless otherwise instructed by your provider.     Friday January 20, 2023  Saturday January 21, 2023 Sunday January 22, 2023 Monday January 23, 2023   Do not lift, push, pull, or carry anything over 10 pounds with the affected arm until 6 weeks (Friday February 24, 2023 ) after your procedure.   You may drive AFTER your wound check, unless you have been told otherwise by your provider.   Ask your healthcare provider when you can go back to work   INCISION/Dressing Ok to resume your Eliquis on 01/17/23 PM.  If large square, outer bandage is left in place, this can be removed after 24 hours from your procedure. Do not remove steri-strips or glue as below.   Monitor your defibrillator site for redness, swelling, and drainage. Call the device clinic at [phone] if you experience these symptoms or fever/chills.  If your incision is closed with Dermabond/Surgical glue. You may shower 1 day after your pacemaker implant and wash around the site with soap and water.    If you were discharged in a sling, please do not wear this during the day more than 48 hours after your surgery unless otherwise instructed. This may increase the risk of stiffness and soreness in your shoulder.   Avoid lotions, ointments, or perfumes over your incision until it is well-healed.  You may use a hot tub or a pool AFTER your wound check appointment if the incision is completely closed.  Your ICD is designed to protect you from life threatening heart rhythms. Because of this, you may receive a shock.   1 shock with no symptoms:  Call the office during business hours. 1 shock with symptoms (chest pain, chest pressure, dizziness, lightheadedness,  shortness of breath, overall feeling unwell):  Call 911. If you experience 2 or more shocks in 24 hours:  Call 911. If you receive a shock, you should not drive for 6 months per the [place] DMV IF you receive appropriate therapy from your ICD.   ICD Alerts:  Some alerts are vibratory and others beep. These are NOT emergencies. Please call our office to let us know. If this occurs at night or on weekends, it can wait until the next business day. Send a remote transmission.  If your device is capable of reading fluid status (for heart failure), you will be offered monthly monitoring to review this with you.   DEVICE MANAGEMENT Remote monitoring is used to monitor your ICD from home. This monitoring is scheduled every 91 days by our office. It allows [place] to keep an eye on the functioning of your device to ensure it is working properly. You will routinely see your Electrophysiologist annually (more often if necessary).   You should receive your ID card for your new device in 4-8 weeks. Keep this card with you at all times once received. Consider wearing a medical alert bracelet or necklace.  Your ICD  may be MRI compatible. This will be discussed at your next office visit/wound check.  You should avoid contact with strong electric or magnetic fields.   Do not use amateur (ham) radio equipment or electric (arc) welding torches. MP3 player headphones with magnets should not be used. Some devices  are safe to use if held at least 12 inches (30 cm) from your defibrillator. These include power tools, lawn mowers, and speakers. If you are unsure if something is safe to use, ask your health care provider.  When using your cell phone, hold it to the ear that is on the opposite side from the defibrillator. Do not leave your cell phone in a pocket over the defibrillator.  You may safely use electric blankets, heating pads, computers, and microwave ovens.  Call the office right away if: You have chest pain. You  feel more than one shock. You feel more short of breath than you have felt before. You feel more light-headed than you have felt before. Your incision starts to open up.  This information is not intended to replace advice given to you by your health care provider. Make sure you discuss any questions you have with your health care provider.

## 2023-01-13 NOTE — Plan of Care (Signed)

## 2023-01-13 NOTE — Progress Notes (Signed)
Progress Note   Patient: Austin [name] ZOX:[id number] DOB: February 06, 1956 DOA: 01/05/2023     8 DOS: the patient was seen and examined on 01/13/2023   Brief hospital course: Mr. Fawcett was admitted to the hospital with the working diagnosis of heart failure exacerbation.   67 yo male with the past medical history of heart failure, hyperlipidemia, coronary artery disease, paroxysmal atrial fibrillation and BPH who presented with worsening dyspnea for 2 to 3 days, associated with orthopnea. On her initial physical examination her blood pressure was 126/76, HR 70, RR 26 and 02 saturation 97%, lungs with no wheezing or rhonchi, heart with S1 and S2 present and regular, abdomen with no distention and no lower extremity edema.  Chest radiograph with cardiomegaly, bilateral hilar vascular congestion with bilateral pleural effusions.  CT chest with bilateral pleural effusions, emphysema, and cardiomegaly.  CT abdomen and pelvis with moderate size right pleural effusion. Mild circumferential bladder wall thickening.  Prostatomegaly.   EKG 75 bpm, left axis deviation, left bundle branch block, qtc 533, atrial sensed and ventricular paced rhythm, with no significant ST segment or T wave changes.   Patient was placed on IV furosemide for diuresis.    11/22 patient transferred to ICU, for inotropic support.  SV02 44.7, placed on milrinone infusion. 11/23 responding well to inotropic support.  11/24 improved volume status, plan to upgrade pacemaker to CRT and placing a atrial lead.   11/25 weaning down milrinone infusion.  11/26 off milrinone today. 11/27 plan to transfer patient to telemetry and have pacemaker upgrade on 11/29. Milrinone per peripheral access.   11/28 pending pacemaker upgrade.  11/29 LV lead insertion, atrial insertion with generator replacement   Assessment and Plan: * Acute on chronic systolic CHF (congestive heart failure) (HCC) Echocardiogram with reduced LV systolic function  with EF <20%, global hypokinesis, internal cavity with severe dilatation, RV systolic function preserved, LA and RA with mild dilatation, no significant valvular disease.   Patient clinically euvolemic.  Systolic blood pressure 106 to 110 mmHg.   Continue medical therapy spironolactone, losartan and digoxin.  Holding SGLT 2 inh due to procedure.  No Beta blockade due to low cardiac output.   11/26 off milrinone  11/27 resumed on low dose milrinone per peripheral access to avoid low output while he waits for his upgrade pacemaker.  11/29 discontinue milrinone.  Holding on loop diuretic for now.   Essential hypertension Continue blood pressure control with losartan.   Atrial fibrillation (HCC) Rate control with digoxin Continue anticoagulation with heparin drip in preparation for procedure.   Patient has been ventricular paced rhythm frequent PAC Started on mexiletine.  Considering under atrial sensing and reduced LV systolic function with prolonged qrs, plan for upgrade pacemaker and CRT.   CKD stage 3a, GFR 45-59 ml/min (HCC) Hyponatremia. (Base serum cr 1,4)   Euvolemic state, renal function with serum cr at 1,48 with K at 4,5 and serum bicarbonate at 20,  Na 134   Continue with spironolactone.  Holding loop diuretic therapy and SGLT 2 inh.  Follow up renal function in am.   Malnutrition of moderate degree Continue with nutritional supplements.   Constipation 11/21 abdomen and pelvis CT with moderate of stool throughout the colon with no other acute changes.   Abdominal pain has improved with bowel regimen.   Anemia Anemia of chronic disease, with stable cell count. Hgb is 9.1 today.  Iron deficiency anemia with serum iron 11, TIBC 438, transferrin saturation 3 and ferritin 17.  Add  IV iron today.   Dyslipidemia Continue with pravastatin.        Subjective: Patient with positive left shoulder pain post procedure, no dyspnea or abdominal pain   Physical  Exam: Vitals:   01/13/23 1335 01/13/23 1340 01/13/23 1345 01/13/23 1417  BP: 111/78 112/81 116/79 106/73  Pulse:  72 70 70  Resp: 19 17 16 16   Temp:  99.4 F (37.4 C)  97.8 F (36.6 C)  TempSrc:  Temporal  Oral  SpO2: 100% 100% 97% 93%  Weight:      Height:       Neurology awake and alert ENT with mild pallor Cardiovascular with S1 and S2 present and regular with no gallops, rubs or murmurs No JVD No lower extremity edema Respiratory with no rales or wheezing, no rhonchi Abdomen with no distention   Data Reviewed:    Family Communication: no family at the bedside   Disposition: Status is: Inpatient Remains inpatient appropriate because: sp pacemaker upgrade   Planned Discharge Destination: Home     Author: Coralie Keens, MD 01/13/2023 3:49 PM  For on call review [email or web address].

## 2023-01-13 NOTE — Transfer of Care (Signed)
Immediate Anesthesia Transfer of Care Note  Patient: Austin [name]  Procedure(s) Performed: BIV UPGRADE  Patient Location: PACU  Anesthesia Type:General  Level of Consciousness: awake  Airway & Oxygen Therapy: Patient Spontanous Breathing  Post-op Assessment: Report given to RN  Post vital signs: stable  Last Vitals:  Vitals Value Taken Time  BP 110/95 01/13/23 1306  Temp    Pulse 75 01/13/23 1307  Resp 14 01/13/23 1307  SpO2 100 % 01/13/23 1307  Vitals shown include unfiled device data.  Last Pain:  Vitals:   01/13/23 0947  TempSrc:   PainSc: 0-No pain      Patients Stated Pain Goal: 0 (01/13/23 0947)  Complications: There were no known notable events for this encounter.

## 2023-01-13 NOTE — Progress Notes (Signed)
   01/13/23 0424  Provider Notification  Provider Name/Title Howerter  Date Provider Notified 01/13/23  Time Provider Notified 0424  Method of Notification Page  Notification Reason Critical Result  Test performed and critical result PTT  Date Critical Result Received 01/13/23  Time Critical Result Received 0414  Provider response No new orders

## 2023-01-13 NOTE — Progress Notes (Signed)
  Patient Name: Austin [name] Date of Encounter: 01/13/2023  Primary Cardiologist: None Electrophysiologist: None  Interval Summary  Denies chest pain and SOB Reasonable understanding of the plan Discussed DNR status,  he would like his HV system replaced with HV and not downgraded to pacemaker  Vital Signs    Vitals:   01/12/23 1640 01/12/23 1919 01/13/23 0129 01/13/23 0500  BP:  112/80 (!) 108/57 91/71  Pulse:  68 75 68  Resp:  18 18 18   Temp:  98.2 F (36.8 C) 98 F (36.7 C) 97.6 F (36.4 C)  TempSrc: Oral Oral Oral Oral  SpO2:  100% 96% 100%  Weight:    88.3 kg  Height:        Intake/Output Summary (Last 24 hours) at 01/13/2023 0628 Last data filed at 01/13/2023 0516 Gross per 24 hour  Intake 1226.61 ml  Output 1725 ml  Net -498.39 ml   Filed Weights   01/11/23 1559 01/12/23 0336 01/13/23 0500  Weight: 88.4 kg 88 kg 88.3 kg    Physical Exam    GEN- The patient is well appearing, alert and oriented x 3 today.   Lungs- Clear to ausculation bilaterally, normal work of breathing Cardiac-  rate and rhythm, no murmurs, rubs or gallops GI- soft, NT, ND, + BS Extremities- no clubbing or cyanosis. No edema  Telemetry    Intermittent heart block  (personally reviewed)  Hospital Course    Austin [name] is a 67 y.o. male with PMH of HFrEF, HTN, HLD, nonobstructive CAD 2012, CKD Stage III, PAF, and single chamber Biotronik with dual DX lead admitted 11/21 with increased SOB, volume overload that had progressed over several weeks. Suspect new conduction system disease leading to worsening hemodynamics. Imaging review shows loop in ICD lead which may be causing direct trauma. Pending CRT-D upgrade.   Assessment & Plan    Intermittent 2:1 HB / CHB  LVEF 20%, known Biotronik DX ICD -reviewed with Dr. Graciela Husbands and ok to proceed for Friday for BiV upgrade, will need anesthesia  -NPO after MN on Friday (order in place) -euvolemic on exam   Atrial undersensing (  possibly related to location of electrode)    CHFrEF  EF < 20%. What initially appeared to be PVCs is probably V-paced events  -HF Team plans to add milrinone back peripherally for now and stop on Friday am to avoid low flow    Hx Paroxysmal AF  -continue heparin for now, stop heparin infusion at 0600 on Friday am (reviewed with pharmacy for stop time / in place)  Anemia Fe Def   LABS not consistent with ACD or renal disease, although they may be contributing, He is iron deficient  will order FOBT and will need this evaluated         Signed, Canary Brim, MSN, APRN, NP-C, AGACNP-BC  [facility]  01/13/2023, 6:28 AM  For CRT upgrade today, will not likely be able to move RV lead NICM with prolongation of QRSd 152 <<114 and higher grade haert block Not sure of mechanism -- prob not sarcoid but should be considered  Atrial lead undersensing  will need new atrial lead in addition to LV lead   Needs eval of FeDEf anemia

## 2023-01-14 ENCOUNTER — Other Ambulatory Visit (HOSPITAL_COMMUNITY): Payer: Self-pay

## 2023-01-14 ENCOUNTER — Inpatient Hospital Stay (HOSPITAL_COMMUNITY): Payer: Medicare PPO

## 2023-01-14 DIAGNOSIS — I5022 Chronic systolic (congestive) heart failure: Secondary | ICD-10-CM | POA: Diagnosis not present

## 2023-01-14 DIAGNOSIS — I4819 Other persistent atrial fibrillation: Secondary | ICD-10-CM | POA: Diagnosis not present

## 2023-01-14 DIAGNOSIS — N1831 Chronic kidney disease, stage 3a: Secondary | ICD-10-CM | POA: Diagnosis not present

## 2023-01-14 DIAGNOSIS — I5023 Acute on chronic systolic (congestive) heart failure: Secondary | ICD-10-CM | POA: Diagnosis not present

## 2023-01-14 DIAGNOSIS — I10 Essential (primary) hypertension: Secondary | ICD-10-CM | POA: Diagnosis not present

## 2023-01-14 LAB — GLUCOSE, CAPILLARY: Glucose-Capillary: 104 mg/dL — ABNORMAL HIGH (ref 70–99)

## 2023-01-14 LAB — CBC
HCT: 29.8 % — ABNORMAL LOW (ref 39.0–52.0)
Hemoglobin: 8.7 g/dL — ABNORMAL LOW (ref 13.0–17.0)
MCH: 22.8 pg — ABNORMAL LOW (ref 26.0–34.0)
MCHC: 29.2 g/dL — ABNORMAL LOW (ref 30.0–36.0)
MCV: 78 fL — ABNORMAL LOW (ref 80.0–100.0)
Platelets: 220 10*3/uL (ref 150–400)
RBC: 3.82 MIL/uL — ABNORMAL LOW (ref 4.22–5.81)
RDW: 15.9 % — ABNORMAL HIGH (ref 11.5–15.5)
WBC: 9.1 10*3/uL (ref 4.0–10.5)
nRBC: 0 % (ref 0.0–0.2)

## 2023-01-14 LAB — BASIC METABOLIC PANEL
Anion gap: 5 (ref 5–15)
BUN: 16 mg/dL (ref 8–23)
CO2: 22 mmol/L (ref 22–32)
Calcium: 9.2 mg/dL (ref 8.9–10.3)
Chloride: 109 mmol/L (ref 98–111)
Creatinine, Ser: 1.5 mg/dL — ABNORMAL HIGH (ref 0.61–1.24)
GFR, Estimated: 50 mL/min — ABNORMAL LOW (ref >60)
Glucose, Bld: 101 mg/dL — ABNORMAL HIGH (ref 70–99)
Potassium: 4 mmol/L (ref 3.5–5.1)
Sodium: 136 mmol/L (ref 135–145)

## 2023-01-14 LAB — APTT: aPTT: 33 s (ref 24–36)

## 2023-01-14 MED ORDER — APIXABAN 5 MG PO TABS: 5.0000 mg | ORAL_TABLET | Freq: Two times a day (BID) | ORAL | Status: AC

## 2023-01-14 MED ORDER — ACETAMINOPHEN 325 MG PO TABS: 650.0000 mg | ORAL_TABLET | ORAL | Status: AC | PRN

## 2023-01-14 MED ORDER — APIXABAN 5 MG PO TABS: 5.0000 mg | ORAL_TABLET | Freq: Two times a day (BID) | ORAL | Status: DC

## 2023-01-14 MED ORDER — MEXILETINE HCL 150 MG PO CAPS
150.0000 mg | ORAL_CAPSULE | Freq: Two times a day (BID) | ORAL | 0 refills | Status: DC
  Filled 2023-01-14: qty 60, 30d supply, fill #0

## 2023-01-14 MED ORDER — POLYETHYLENE GLYCOL 3350 17 G PO PACK: 17.0000 g | PACK | Freq: Every day | ORAL | Status: DC

## 2023-01-14 NOTE — Progress Notes (Addendum)
  Patient Name: Austin [name] Date of Encounter: 01/14/2023  Primary Cardiologist: None Electrophysiologist: None    Hospital Course    Austin [name] is a 67 y.o. male with PMH of HFrEF, HTN, HLD, nonobstructive CAD 2012, CKD Stage III, PAF, and single chamber Biotronik with dual DX lead admitted 11/21 with increased SOB, volume overload that had progressed over several weeks. Suspect new conduction system disease leading to worsening hemodynamics. Imaging review shows loop in ICD lead which may be causing direct trauma. S/p CRT-D upgrade.    Interval Summary  Feels great Vital Signs    Vitals:   01/13/23 2048 01/14/23 0000 01/14/23 0453 01/14/23 0722  BP: 97/73 97/70 108/71 105/74  Pulse: 68 79 61 66  Resp: 16 16 16 20   Temp: 98.5 F (36.9 C) 98 F (36.7 C) (!) 97.1 F (36.2 C) (!) 97.4 F (36.3 C)  TempSrc: Oral Oral Oral Oral  SpO2: 100% 100% 100% 93%  Weight:   92 kg   Height:        Intake/Output Summary (Last 24 hours) at 01/14/2023 9604 Last data filed at 01/14/2023 0600 Gross per 24 hour  Intake 149.93 ml  Output 210 ml  Net -60.07 ml   Filed Weights   01/12/23 0336 01/13/23 0500 01/14/23 0453  Weight: 88 kg 88.3 kg 92 kg    Physical Exam  Well developed and well nourished in no acute distress HENT normal Neck supple with JVP-flat Clear with upper airway ronchi, otherwise clear Pocket without  hematoma, swelling or tenderness  Regular rate and rhythm, no  gallop No murmur Abd-soft with active BS No Clubbing cyanosis  edema Skin-warm and dry A & Oriented  Grossly normal sensory and motor function  ECG sinus P-synchronous/ AV  pacing upright QRS lead V1 QRSd 172 Preimplant 190   Device function is normal.  Programming changes none  See Paceart for details    Chest x-ray without pneumothorax  Telemetry    Intermittent heart block  (personally reviewed) Assessment & Plan    Intermittent 2:1 HB / CHB status post CRT-D upgrade  01/12/1929 LVEF 20%, known Biotronik DX ICD -reviewed with Dr. Graciela Husbands and ok to proceed for Friday for BiV upgrade, will need anesthesia       CHFrEF  EF < 20%. What initially appeared to be PVCs is probably V-paced events  -HF Team plans to add milrinone back peripherally for now and stop on Friday am to avoid low flow    Hx Paroxysmal AF  Will resume anticoagulation on day 4 postprocedure  Anemia Fe Def   LABS not consistent with ACD or renal disease, although they may be contributing, He is iron deficient  will order FOBT and will need this evaluated       DEvice function normal From that POV ok to go home Euvolemic and CHF will weigh in But I would suggest evaluation of his Fe Def anemia prior to discharge, at least formalizing the next steps Resume anticoagulation with Apixaban  on 12/3 pm

## 2023-01-14 NOTE — Care Management (Signed)
Spoke w patient, he politely declined HH services, states he understands what to do himself.

## 2023-01-14 NOTE — Plan of Care (Signed)

## 2023-01-14 NOTE — Discharge Summary (Addendum)
Physician Discharge Summary   Patient: Austin [name] MRN: [id number] DOB: 07-20-1955  Admit date:     01/05/2023  Discharge date: 01/14/23  Discharge Physician: Austin [name]   PCP: Austin Galas, NP   Recommendations at discharge:    Patient had his pacemaker defibrillator upgrade to CRT, a right atrial lead was placed and the generator was replaced.  Continue heart failure guideline medical therapy with spironolactone, SGLT 2 inh, and losartan.  Not on B blocker due to recovering from low output heart failure, plan to continue digoxin. Continue diuresis with torsemide M-W-F.  Follow up with Austin Hau NP in 7 to 10 days.  Will need further work up for iron deficiency anemia as outpatient.  Follow up renal function, electrolytes and cell count in 7 days as outpatient.  Follow up iron stores in 3 weeks. Follow up with Cardiology as scheduled.    Discharge Diagnoses: Principal Problem:   Acute on chronic systolic CHF (congestive heart failure) (HCC) Active Problems:   Essential hypertension   Atrial fibrillation (HCC)   CKD stage 3a, GFR 45-59 ml/min (HCC)   Malnutrition of moderate degree   Constipation   Anemia   Dyslipidemia  Resolved Problems:   * No resolved hospital problems. [facility] Course: Austin [name] was admitted to the hospital with the working diagnosis of heart failure exacerbation.   67 yo male with the past medical history of heart failure, hyperlipidemia, coronary artery disease, paroxysmal atrial fibrillation and BPH who presented with worsening dyspnea for 2 to 3 days, associated with orthopnea. On his initial physical examination her blood pressure was 126/76, HR 70, RR 26 and 02 saturation 97%, lungs with no wheezing or rhonchi, heart with S1 and S2 present and regular, abdomen with no distention and no lower extremity edema.  Na 138, K 4.0 Cl 108 bicarbonate 21, glucose 89, bun 16 cr 1,23  BNP 1,126  High sensitive troponin 40 and  33 Iron panel with serum iron 11, TIBC 438, transferrin saturation 3 and ferritin 17  Wbc 6,4 hgb 9,0 plt 240  Digoxin level < 0.2  Sars covid 19 negative  Urine analysis SG 1,016 with negative protein, negative leukocytes and negative Hgb.   Chest radiograph with cardiomegaly, bilateral hilar vascular congestion with bilateral pleural effusions.  Pacemaker defibrillator in place with one right ventricular lead in place.   CT chest with bilateral pleural effusions, emphysema, and cardiomegaly.  CT abdomen and pelvis with moderate size right pleural effusion. Mild circumferential bladder wall thickening.  Prostatomegaly.   EKG 75 bpm, left axis deviation, left bundle branch block, qtc 533, atrial sensed and ventricular paced rhythm, with no significant ST segment or T wave changes.   Patient was placed on IV furosemide for diuresis.    11/22 patient transferred to ICU, for inotropic support.  SV02 44.7, placed on milrinone infusion. 11/23 responding well to inotropic support.  11/24 improved volume status, plan to upgrade pacemaker to CRT and placing a atrial lead.   11/25 weaning down milrinone infusion.  11/26 off milrinone today. 11/27 plan to transfer patient to telemetry and have pacemaker upgrade on 11/29. Milrinone per peripheral access.   11/28 pending pacemaker upgrade.  11/29 LV lead insertion, atrial insertion with generator replacement  11/30 patient will be discharged home with close follow up. Further workup for iron deficiency as outpatient.   Assessment and Plan: * Acute on chronic systolic CHF (congestive heart failure) (HCC) Echocardiogram with reduced LV systolic function with  EF <20%, global hypokinesis, internal cavity with severe dilatation, RV systolic function preserved, LA and RA with mild dilatation, no significant valvular disease.   Patient had IV furosemide and inotropic support with improvement in volume status, at the time of his discharge his fluid  balance is negative -10,419 ml, with significant improvement in his symptoms.   Plan to continue medical therapy spironolactone, losartan and digoxin.  Resume SGLT 2 inh at discharge.  No Beta blockade due to low cardiac output.   His pacemaker has been upgrade for CRT and right atrial lead has been implanted with good toleration.     Essential hypertension Continue blood pressure control with losartan.   Atrial fibrillation (HCC) Rate control with digoxin Patient was placed on IV heparin for anticoagulation bridging in preparation for pacemaker upgrade.   Started on mexiletine to suppress PVC  Considering under atrial sensing and reduced LV systolic function with prolonged qrs, patient had his pacemaker upgrade to CRT and a right atrial lead was placed, replaced generator.   11/30 EKG with 60 bpm, left axis deviation, left bundle branch block, qtc 470, atrial sensing, atrial pacing and ventricular pacing, with no significant ST segment or T wave changes.   Plan to resume apixaban on 01/17/23 pm.   CKD stage 3a, GFR 45-59 ml/min (HCC) Hyponatremia. (Base serum cr 1,4)   Patient with improvement in volume status, at the time of his discharge his renal function has a serum cr of 1,50 with K at 4,0 and serum bicarbonate at 22.  Na 136   Continue with spironolactone, torsemide and SGLT 2 inh.  Patient on K supplementation at home.  Follow up renal function and electrolytes in 7 days as outpatient.   Malnutrition of moderate degree Continue with nutritional supplements.   Constipation 11/21 abdomen and pelvis CT with moderate of stool throughout the colon with no other acute changes.   Abdominal pain has improved with bowel regimen.   Anemia Anemia of chronic disease, with stable cell count.  Iron deficiency anemia with serum iron 11, TIBC 438, transferrin saturation 3 and ferritin 17.   SP IV iron sucrose infusion 200 mg.  Plan to follow up iron panel as outpatient in 3  weeks.  Will need further work up for iron deficiency as outpatient.  His discharge hgb is 8,7.  Risk vs benefit, plan to continue anticoagulation with apixaban.  Currently no signs of active bleeding.   Dyslipidemia Continue with pravastatin.          Consultants: cardiology, EP  Procedures performed: pacemaker upgrade as above. Central line  Disposition: Home Diet recommendation:  Discharge Diet Orders (From admission, onward)     Start     Ordered   01/14/23 0000  Diet - low sodium heart healthy        01/14/23 1201           Cardiac diet DISCHARGE MEDICATION: Allergies as of 01/14/2023       Reactions   Lisinopril Cough        Medication List     STOP taking these medications    amiodarone 100 MG tablet Commonly known as: PACERONE   cephALEXin 500 MG capsule Commonly known as: KEFLEX       TAKE these medications    acetaminophen 325 MG tablet Commonly known as: TYLENOL Take 2 tablets (650 mg total) by mouth every 4 (four) hours as needed for moderate pain (pain score 4-6).   albuterol 108 (90 Base) MCG/ACT inhaler  Commonly known as: VENTOLIN HFA Inhale 1-2 puffs into the lungs every 6 (six) hours as needed.   apixaban 5 MG Tabs tablet Commonly known as: ELIQUIS Take 1 tablet (5 mg total) by mouth 2 (two) times daily. Start taking on 01/17/23 pm What changed: additional instructions   Cholecalciferol 50 MCG (2000 UT) Tabs Take 1 tablet by mouth daily.   digoxin 0.125 MG tablet Commonly known as: LANOXIN Take 1 tablet (0.125 mg total) by mouth daily.   fenofibrate micronized 67 MG capsule Commonly known as: LOFIBRA Take 67 mg by mouth every morning.   ferrous sulfate 325 (65 FE) MG tablet Take 325 mg by mouth.   finasteride 5 MG tablet Commonly known as: PROSCAR Take 1 tablet by mouth daily.   HYDROcodone-acetaminophen 5-325 MG tablet Commonly known as: NORCO/VICODIN Take 1-2 tablets by mouth every 6 (six) hours as needed for  moderate pain (pain score 4-6).   Jardiance 10 MG Tabs tablet Generic drug: empagliflozin Take 1 tablet (10 mg total) by mouth daily.   losartan 25 MG tablet Commonly known as: COZAAR Take 1 tablet (25 mg total) by mouth daily.   melatonin 3 MG Tabs tablet Take 6 mg by mouth at bedtime.   mexiletine 150 MG capsule Commonly known as: MEXITIL Take 1 capsule (150 mg total) by mouth every 12 (twelve) hours.   omeprazole 20 MG capsule Commonly known as: PRILOSEC Take 20 mg by mouth daily.   ondansetron 4 MG tablet Commonly known as: ZOFRAN Take 4 mg by mouth daily as needed for nausea or vomiting.   polyethylene glycol 17 g packet Commonly known as: MIRALAX / GLYCOLAX Take 17 g by mouth daily. Start taking on: January 15, 2023   potassium chloride 10 MEQ tablet Commonly known as: KLOR-CON Take 1 tablet (10 mEq total) by mouth every Monday, Wednesday, and Friday.   pravastatin 40 MG tablet Commonly known as: PRAVACHOL Take 1 tablet (40 mg total) by mouth daily.   spironolactone 25 MG tablet Commonly known as: ALDACTONE Take 1 tablet (25 mg total) by mouth daily.   tamsulosin 0.4 MG Caps capsule Commonly known as: FLOMAX Take 0.4 mg by mouth daily after supper.   torsemide 20 MG tablet Commonly known as: DEMADEX Take 1 tablet (20 mg total) by mouth every Monday, Wednesday, and Friday.        Discharge Exam: Filed Weights   01/12/23 0336 01/13/23 0500 01/14/23 0453  Weight: 88 kg 88.3 kg 92 kg   BP 110/77 (BP Location: Left Arm)   Pulse 71   Temp 97.8 F (36.6 C) (Oral)   Resp 20   Ht 6\' 4"  (1.93 m)   Wt 92 kg   SpO2 100%   BMI 24.69 kg/m   Patient is feeling better, no chest pain or dyspnea, no PND or orthopnea.  Neurology awake and alert ENT with no pallor Cardiovascular with S1 and S2 present and regular with no gallops, rubs or murmurs No JVD No lower extremity edema Respiratory with no rales or wheezing, no rhonchi Abdomen with no distention    Condition at discharge: stable  The results of significant diagnostics from this hospitalization (including imaging, microbiology, ancillary and laboratory) are listed below for reference.   Imaging Studies: DG Chest 2 View  Result Date: 01/14/2023 CLINICAL DATA:  [id number].  Cardiac device in-situ. EXAM: CHEST - 2 VIEW COMPARISON:  Portable 01/06/2023 FINDINGS: 6:18 a.m. Interval removal right IJ line. Left chest cardiac assist device is again noted. The  prior study demonstrated a single lead terminating in the right ventricle. Currently there is a wire terminating in the right ventricle, another in the right atrium and a third AID wire, the latter which does not have the usual positioning. Clinical correlation is suggested. Heart size and vasculature are normal. The mediastinum is stable. There is aortic atherosclerosis. The lungs are mildly emphysematous but clear. Osteopenia. IMPRESSION: 1. Left chest cardiac assist device is again noted. The prior study demonstrated a single lead terminating in the right ventricle. Currently there is a wire terminating in the right ventricle, another in the right atrium and a third AID wire, the latter which does not have the usual positioning and may be folded back on itself in the coronary sinus. 2. No evidence of acute chest disease. 3. Aortic atherosclerosis. 4. Emphysema. Electronically Signed   By: Almira Bar M.D.   On: 01/14/2023 07:06   PERIPHERAL VASCULAR CATHETERIZATION  Result Date: 01/10/2023  CONCLUSIONS:  1.  Patent left subclavian vein during venogram  2.  No early apparent complications.  3. Plan for CRT upgrade with anesthesia support   DG CHEST PORT 1 VIEW  Result Date: 01/06/2023 CLINICAL DATA:  Central venous catheter placement EXAM: PORTABLE CHEST 1 VIEW COMPARISON:  01/05/2023 FINDINGS: Right internal jugular central venous catheter tip noted within the superior vena cava. Lungs are symmetrically mildly hyperinflated in keeping with  changes of underlying COPD. Small right pleural effusion. No confluent pulmonary infiltrate. No pneumothorax. Cardiac size is mildly enlarged, unchanged. Left subclavian single lead pacemaker defibrillator is unchanged. No acute bone abnormality. IMPRESSION: 1. Right internal jugular central venous catheter tip within the superior vena cava. No pneumothorax. 2. Small right pleural effusion. 3. COPD. Electronically Signed   By: Helyn Numbers M.D.   On: 01/06/2023 20:35   ECHOCARDIOGRAM COMPLETE  Result Date: 01/06/2023    ECHOCARDIOGRAM REPORT   Patient Name:   Austin [name] Date of Exam: 01/06/2023 Medical Rec #:  [id number]        Height:       76.0 in Accession #:    [id number]       Weight:       212.3 lb Date of Birth:  09/04/1955         BSA:          2.272 m Patient Age:    67 years         BP:           125/84 mmHg Patient Gender: M                HR:           71 bpm. Exam Location:  Outpatient Procedure: 2D Echo, Color Doppler and Cardiac Doppler Indications:    CHF  History:        Patient has prior history of Echocardiogram examinations, most                 recent 08/08/2022. CHF, Arrythmias:Atrial Fibrillation; Risk                 Factors:Hypertension.  Sonographer:    Vern Claude Referring Phys: Lurline Del IMPRESSIONS  1. Left ventricular ejection fraction, by estimation, is <20%. The left ventricle has severely decreased function. The left ventricle demonstrates global hypokinesis. The left ventricular internal cavity size was severely dilated. Left ventricular diastolic function could not be evaluated.  2. Right ventricular systolic function is normal. The right ventricular size is normal.  There is normal pulmonary artery systolic pressure.  3. Left atrial size was mildly dilated.  4. Right atrial size was mildly dilated.  5. The mitral valve is normal in structure. Mild mitral valve regurgitation. No evidence of mitral stenosis.  6. The aortic valve is grossly normal. Aortic valve  regurgitation is not visualized. No aortic stenosis is present.  7. The inferior vena cava is normal in size with greater than 50% respiratory variability, suggesting right atrial pressure of 3 mmHg. Comparison(s): No significant change from prior study. FINDINGS  Left Ventricle: Left ventricular ejection fraction, by estimation, is <20%. The left ventricle has severely decreased function. The left ventricle demonstrates global hypokinesis. The left ventricular internal cavity size was severely dilated. There is no left ventricular hypertrophy. Left ventricular diastolic function could not be evaluated due to atrial fibrillation. Left ventricular diastolic function could not be evaluated. Right Ventricle: Calcification noted on device lead, similar to prior. The right ventricular size is normal. Right vetricular wall thickness was not well visualized. Right ventricular systolic function is normal. There is normal pulmonary artery systolic  pressure. The tricuspid regurgitant velocity is 1.60 m/s, and with an assumed right atrial pressure of 3 mmHg, the estimated right ventricular systolic pressure is 13.2 mmHg. Left Atrium: Left atrial size was mildly dilated. Right Atrium: Right atrial size was mildly dilated. Pericardium: There is no evidence of pericardial effusion. Mitral Valve: The mitral valve is normal in structure. Mild mitral valve regurgitation. No evidence of mitral valve stenosis. Tricuspid Valve: The tricuspid valve is normal in structure. Tricuspid valve regurgitation is mild . No evidence of tricuspid stenosis. Aortic Valve: The aortic valve is grossly normal. Aortic valve regurgitation is not visualized. No aortic stenosis is present. Aortic valve mean gradient measures 2.5 mmHg. Aortic valve peak gradient measures 5.2 mmHg. Aortic valve area, by VTI measures 2.68 cm. Pulmonic Valve: The pulmonic valve was grossly normal. Pulmonic valve regurgitation is trivial. No evidence of pulmonic stenosis.  Aorta: The aortic root and ascending aorta are structurally normal, with no evidence of dilitation. Venous: The inferior vena cava is normal in size with greater than 50% respiratory variability, suggesting right atrial pressure of 3 mmHg. IAS/Shunts: The atrial septum is grossly normal. Additional Comments: A device lead is visualized.  LEFT VENTRICLE PLAX 2D LVIDd:         7.00 cm LVIDs:         6.68 cm LV PW:         0.90 cm LV IVS:        0.90 cm LVOT diam:     2.00 cm LV SV:         48 LV SV Index:   21 LVOT Area:     3.14 cm  LV Volumes (MOD) LV vol d, MOD A2C: 372.0 ml LV vol d, MOD A4C: 408.0 ml LV vol s, MOD A2C: 249.0 ml LV vol s, MOD A4C: 280.0 ml LV SV MOD A2C:     123.0 ml LV SV MOD A4C:     408.0 ml LV SV MOD BP:      108.1 ml RIGHT VENTRICLE             IVC RV Basal diam:  3.40 cm     IVC diam: 1.40 cm RV Mid diam:    3.00 cm RV S prime:     11.70 cm/s TAPSE (M-mode): 2.2 cm LEFT ATRIUM             Index  RIGHT ATRIUM           Index LA diam:        3.80 cm 1.67 cm/m   RA Area:     19.80 cm LA Vol (A2C):   70.2 ml 30.90 ml/m  RA Volume:   57.20 ml  25.17 ml/m LA Vol (A4C):   48.0 ml 21.13 ml/m LA Biplane Vol: 58.6 ml 25.79 ml/m  AORTIC VALVE                    PULMONIC VALVE AV Area (Vmax):    2.07 cm     PV Vmax:       1.15 m/s AV Area (Vmean):   2.15 cm     PV Peak grad:  5.3 mmHg AV Area (VTI):     2.68 cm AV Vmax:           114.10 cm/s AV Vmean:          73.900 cm/s AV VTI:            0.178 m AV Peak Grad:      5.2 mmHg AV Mean Grad:      2.5 mmHg LVOT Vmax:         75.00 cm/s LVOT Vmean:        50.500 cm/s LVOT VTI:          0.152 m LVOT/AV VTI ratio: 0.85  AORTA Ao Root diam: 3.60 cm Ao Asc diam:  3.10 cm MITRAL VALVE               TRICUSPID VALVE MV Area (PHT): 6.07 cm    TR Peak grad:   10.2 mmHg MV Decel Time: 125 msec    TR Vmax:        160.00 cm/s MV E velocity: 97.90 cm/s MV A velocity: 85.90 cm/s  SHUNTS MV E/A ratio:  1.14        Systemic VTI:  0.15 m                             Systemic Diam: 2.00 cm Jodelle Red MD Electronically signed by Jodelle Red MD Signature Date/Time: 01/06/2023/1:22:30 PM    Final    CT CHEST WO CONTRAST  Result Date: 01/05/2023 CLINICAL DATA:  Respiratory illness, nondiagnostic x-ray. Chest pain, shortness of breath, nausea, and emesis. Near-syncope. EXAM: CT CHEST WITHOUT CONTRAST TECHNIQUE: Multidetector CT imaging of the chest was performed following the standard protocol without IV contrast. RADIATION DOSE REDUCTION: This exam was performed according to the departmental dose-optimization program which includes automated exposure control, adjustment of the mA and/or kV according to patient size and/or use of iterative reconstruction technique. COMPARISON:  08/07/2022. FINDINGS: Cardiovascular: The heart is enlarged and there is a trace pericardial effusion. Pacemaker leads are noted in the heart. Scattered coronary artery calcifications are present. There is atherosclerotic calcification of the aorta without evidence of aneurysm. Pulmonary trunk is distended suggesting underlying pulmonary artery hypertension. Mediastinum/Nodes: No mediastinal or axillary lymphadenopathy. Evaluation of the hila is limited due to lack of IV contrast. The thyroid gland, trachea, and esophagus are within normal limits. Lungs/Pleura: There is a moderate right pleural effusion and small left pleural effusion with atelectasis bilaterally. Consolidation is present at the right lung base. Paraseptal and centrilobular emphysematous changes are present in the lungs. No pneumothorax is seen. Upper Abdomen: No acute abnormality. Musculoskeletal: Pacemaker device is present in the anterior chest wall  on the left. Mild degenerative changes are present in the thoracic spine. No acute osseous abnormality. IMPRESSION: 1. Moderate pleural effusion on the right and small pleural effusion on the left with associated atelectasis. Consolidation is noted at the right  lung base, possible atelectasis or infiltrate. 2. Emphysema. 3. Distended pulmonary trunk suggesting underlying pulmonary artery hypertension. 4. Cardiomegaly with coronary artery calcifications. 5. Aortic atherosclerosis. Electronically Signed   By: Thornell Sartorius M.D.   On: 01/05/2023 20:45   CT ABDOMEN PELVIS W CONTRAST  Result Date: 01/05/2023 CLINICAL DATA:  Abdominal pain, acute, nonlocalized EXAM: CT ABDOMEN AND PELVIS WITH CONTRAST TECHNIQUE: Multidetector CT imaging of the abdomen and pelvis was performed using the standard protocol following bolus administration of intravenous contrast. RADIATION DOSE REDUCTION: This exam was performed according to the departmental dose-optimization program which includes automated exposure control, adjustment of the mA and/or kV according to patient size and/or use of iterative reconstruction technique. CONTRAST:  75mL OMNIPAQUE IOHEXOL 350 MG/ML SOLN COMPARISON:  CT abdomen/pelvis dated August 07, 2022. FINDINGS: Lower chest: Small to moderate-sized right pleural effusion and small left pleural effusion with basilar atelectasis. Patchy consolidative opacity at the right lung base. Hepatobiliary: Stable 9 mm hypoattenuating focus at the right hepatic dome, too small to definitively characterize. The previously described 1.3 cm focus of enhancement in the left hepatic lobe is not significantly changed and likely represents either a hemangioma or benign vascular abnormality. No gallstones, gallbladder wall thickening, or biliary dilatation. Pancreas: Unremarkable. No pancreatic ductal dilatation or surrounding inflammatory changes. Spleen: Normal in size. Unchanged 5 mm enhancing focus at the anterior inferior spleen (series 3, image 35), which could represent a small hemangioma or benign vascular lesion. The previously noted 2 additional foci of enhancement are not clearly visualized on this exam. Adrenals/Urinary Tract: The adrenal glands are unremarkable. The kidneys  enhance symmetrically. No suspicious focal lesion. Punctate 2 mm hyperdense foci in the interpolar left and right kidneys could represent small nonobstructing stones or foci of vascular calcification. No hydronephrosis. Mild circumferential bladder wall thickening. Stomach/Bowel: Stomach is within normal limits. Appendix appears normal. No evidence of bowel wall thickening, distention, or inflammatory changes. Sigmoid colonic diverticulosis without evidence of acute diverticulitis. Moderate volume of stool throughout the colon. Vascular/Lymphatic: Aortic atherosclerosis. No enlarged abdominal or pelvic lymph nodes. Reproductive: The prostate gland is enlarged and indents the base of the bladder. Other: Small fat containing umbilical hernia. No abdominopelvic ascites. No intraperitoneal free air. Musculoskeletal: No acute osseous abnormality. No suspicious osseous lesion. Degenerative disc disease at L5-S1. IMPRESSION: 1. Small to moderate-sized right pleural effusion and small left pleural effusion. Patchy consolidative opacity at the right lung base could represent atelectasis or infiltrate. 2. Mild circumferential bladder wall thickening may be due to chronic outlet obstruction or cystitis. Recommend correlation with urinalysis. 3. Prostatomegaly. 4. Additional ancillary findings, as described above. 5. Aortic Atherosclerosis (ICD10-I70.0). Electronically Signed   By: Hart Robinsons M.D.   On: 01/05/2023 14:14   DG Chest Port 1 View  Result Date: 01/05/2023 CLINICAL DATA:  Shortness of breath.  Chest pain. EXAM: PORTABLE CHEST 1 VIEW COMPARISON:  08/11/2022. FINDINGS: There is mild diffuse pulmonary vascular congestion and bilateral small layering pleural effusions. There are probable associated compressive atelectatic changes in the bilateral lungs. No acute consolidation or lung collapse. No pneumothorax. There is left apical pleural cap, which may represent layering pleural effusion as well. Stable  mildly enlarged cardio-mediastinal silhouette, which is likely accentuated by AP technique. There is left-sided ICD. No  acute osseous abnormalities. The soft tissues are within normal limits. IMPRESSION: *Findings described above favor congestive heart failure/pulmonary edema. Electronically Signed   By: Jules Schick M.D.   On: 01/05/2023 10:05    Microbiology: Results for orders placed or performed during the hospital encounter of 01/05/23  Resp panel by RT-PCR (RSV, Flu A&B, Covid) Anterior Nasal Swab     Status: None   Collection Time: 01/05/23 10:21 AM   Specimen: Anterior Nasal Swab  Result Value Ref Range Status   SARS Coronavirus 2 by RT PCR NEGATIVE NEGATIVE Final   Influenza A by PCR NEGATIVE NEGATIVE Final   Influenza B by PCR NEGATIVE NEGATIVE Final    Comment: (NOTE) The Xpert Xpress SARS-CoV-2/FLU/RSV plus assay is intended as an aid in the diagnosis of influenza from Nasopharyngeal swab specimens and should not be used as a sole basis for treatment. Nasal washings and aspirates are unacceptable for Xpert Xpress SARS-CoV-2/FLU/RSV testing.  Fact Sheet for Patients: BloggerCourse.com  Fact Sheet for Healthcare Providers: SeriousBroker.it  This test is not yet approved or cleared by the [place] FDA and has been authorized for detection and/or diagnosis of SARS-CoV-2 by FDA under an Emergency Use Authorization (EUA). This EUA will remain in effect (meaning this test can be used) for the duration of the COVID-19 declaration under Section 564(b)(1) of the Act, 21 U.S.C. section 360bbb-3(b)(1), unless the authorization is terminated or revoked.     Resp Syncytial Virus by PCR NEGATIVE NEGATIVE Final    Comment: (NOTE) Fact Sheet for Patients: BloggerCourse.com  Fact Sheet for Healthcare Providers: SeriousBroker.it  This test is not yet approved or cleared by the  [place] FDA and has been authorized for detection and/or diagnosis of SARS-CoV-2 by FDA under an Emergency Use Authorization (EUA). This EUA will remain in effect (meaning this test can be used) for the duration of the COVID-19 declaration under Section 564(b)(1) of the Act, 21 U.S.C. section 360bbb-3(b)(1), unless the authorization is terminated or revoked.  Performed at [facility], [address], [place], [place] [address]   MRSA Next Gen by PCR, Nasal     Status: None   Collection Time: 01/06/23  5:58 PM   Specimen: Nasal Mucosa; Nasal Swab  Result Value Ref Range Status   MRSA by PCR Next Gen NOT DETECTED NOT DETECTED Final    Comment: (NOTE) The GeneXpert MRSA Assay (FDA approved for NASAL specimens only), is one component of a comprehensive MRSA colonization surveillance program. It is not intended to diagnose MRSA infection nor to guide or monitor treatment for MRSA infections. Test performance is not FDA approved in patients less than 56 years old. Performed at [facility], [address], [place], [place] [address]   Surgical pcr screen     Status: None   Collection Time: 01/08/23  2:00 PM   Specimen: Nasal Mucosa; Nasal Swab  Result Value Ref Range Status   MRSA, PCR NEGATIVE NEGATIVE Final   Staphylococcus aureus NEGATIVE NEGATIVE Final    Comment: (NOTE) The Xpert SA Assay (FDA approved for NASAL specimens in patients 80 years of age and older), is one component of a comprehensive surveillance program. It is not intended to diagnose infection nor to guide or monitor treatment. Performed at [facility], [address], [place], [place] [address]     Labs: CBC: Recent Labs  Lab 01/10/23 0437 01/11/23 0231 01/12/23 0236 01/13/23 0251 01/14/23 0221  WBC 5.4 6.5 6.8 7.5 9.1  HGB 8.9* 9.3* 9.1* 9.3* 8.7*  HCT 31.9* 32.0* 31.5* 31.6* 29.8*  MCV 78.8* 76.6* 76.1* 76.9* 78.0*  PLT 220 242 234 213 220   Basic Metabolic  Panel: Recent Labs  Lab 01/10/23 0437 01/11/23 0231 01/12/23 0236 01/13/23 0251 01/14/23 0221  NA 136 136 134* 136 136  K 4.0 4.2 4.5 4.1 4.0  CL 106 106 107 108 109  CO2 21* 22 20* 20* 22  GLUCOSE 103* 104* 115* 102* 101*  BUN 19 20 21 19 16   CREATININE 1.39* 1.68* 1.48* 1.28* 1.50*  CALCIUM 9.5 9.9 9.5 9.3 9.2  PHOS  --   --  3.9  --   --    Liver Function Tests: Recent Labs  Lab 01/12/23 0236  ALBUMIN 4.0   CBG: Recent Labs  Lab 01/12/23 1206 01/12/23 1913 01/13/23 0559 01/13/23 0927 01/14/23 1140  GLUCAP 117* 119* 104* 94 104*    Discharge time spent: greater than 30 minutes.  Signed: Coralie Keens, MD [facility] 01/14/2023

## 2023-01-16 ENCOUNTER — Encounter (HOSPITAL_COMMUNITY): Payer: Self-pay | Admitting: Internal Medicine

## 2023-01-17 ENCOUNTER — Telehealth: Payer: Self-pay | Admitting: *Deleted

## 2023-01-22 NOTE — Progress Notes (Signed)
     Brief progress note:   Chart reviewed. Initial palliative consult completed 01/07/23.  Goals are for treatment and recovery to degree this is possible given patient's advanced heart failure.  Note milrinone off since yesterday 11/26 and plan is for pacemaker upgrade on 11/29.  Palliative care is following for needs and support pending clinical outcomes. Please contact our team at [phone] if there are immediate needs or patient's condition changes.    Sherlean Foot, NP-C Palliative Medicine   Please call Palliative Medicine team phone with any questions [phone]. For individual providers please see AMION.   No charge

## 2023-01-25 ENCOUNTER — Ambulatory Visit (HOSPITAL_COMMUNITY): Admission: RE | Admit: 2023-01-25 | Payer: Medicare PPO | Source: Ambulatory Visit

## 2023-01-25 NOTE — Patient Instructions (Signed)
   After Your ICD (Implantable Cardiac Defibrillator)    Monitor your defibrillator site for redness, swelling, and drainage. Call the device clinic at [phone] if you experience these symptoms or fever/chills.  Your incision was closed with Dermabond:  You may shower 1 day after your defibrillator implant and wash your incision with soap and water. Avoid lotions, ointments, or perfumes over your incision until it is well-healed.  You may use a hot tub or a pool after your wound check appointment if the incision is completely closed.  Do not lift, push or pull greater than 10 pounds with the affected arm until 6 weeks after your procedure. UNTIL AFTER JANUARY 10TH.  There are no other restrictions in arm movement after your wound check appointment.   Your ICD is designed to protect you from life threatening heart rhythms. Because of this, you may receive a shock.   1 shock with no symptoms:  Call the office during business hours. 1 shock with symptoms (chest pain, chest pressure, dizziness, lightheadedness, shortness of breath, overall feeling unwell):  Call 911. If you experience 2 or more shocks in 24 hours:  Call 911. If you receive a shock, you should not drive.  [place] DMV - no driving for 6 months if you receive appropriate therapy from your ICD.   ICD Alerts:  Some alerts are vibratory and others beep. These are NOT emergencies. Please call our office to let us know. If this occurs at night or on weekends, it can wait until the next business day. Send a remote transmission.  If your device is capable of reading fluid status (for heart failure), you will be offered monthly monitoring to review this with you.   Remote monitoring is used to monitor your ICD from home. This monitoring is scheduled every 91 days by our office. It allows [place] to keep an eye on the functioning of your device to ensure it is working properly. You will routinely see your Electrophysiologist annually (more  often if necessary).

## 2023-01-26 ENCOUNTER — Ambulatory Visit: Payer: Medicare PPO | Attending: Cardiology

## 2023-01-26 ENCOUNTER — Telehealth: Payer: Self-pay

## 2023-01-26 DIAGNOSIS — I442 Atrioventricular block, complete: Secondary | ICD-10-CM

## 2023-01-26 LAB — CUP PACEART INCLINIC DEVICE CHECK
Date Time Interrogation Session: 20241212222514
Implantable Lead Connection Status: 753985
Implantable Lead Connection Status: 753985
Implantable Lead Connection Status: 753985
Implantable Lead Implant Date: 20220808
Implantable Lead Implant Date: 20241129
Implantable Lead Implant Date: 20241129
Implantable Lead Location: 753858
Implantable Lead Location: 753859
Implantable Lead Location: 753860
Implantable Lead Model: 436910
Implantable Lead Model: 5076
Implantable Lead Serial Number: 81450996
Implantable Pulse Generator Implant Date: 20241129
Pulse Gen Model: 429522
Pulse Gen Serial Number: 84968923

## 2023-01-26 NOTE — Progress Notes (Signed)
Wound check appointment. Steri-strips removed. Wound without redness or edema. Incision edges approximated, wound well healed.  Normal device function.  BIVP: 88%, note PVC burden 11%.  Will flag for Dr. Odessa Fleming review.  Thresholds, sensing, and impedances consistent with implant measurements. Device programmed at 3.5V for extra safety margin until 3 month visit. Histogram distribution appropriate for patient and level of activity. AT/AF 2.7 %, 1 AFib event (6.5 hours).  No ventricular arrhythmias noted. Patient educated about wound care, arm mobility, lifting restrictions, shock plan. ROV in 3 months with implanting physician.

## 2023-01-26 NOTE — Telephone Encounter (Signed)
14 day in clinic post-op check  MDT BiV ICD upgrade on 01/13/23.   Flagging BiVP percentage of 88% PVC Burden 11%. Meds: Mexiletine, digoxin Underlying:  AS/VS 40-108, Frequent Ectopy.  Patient asymptomatic

## 2023-01-30 ENCOUNTER — Ambulatory Visit (HOSPITAL_COMMUNITY)
Admission: RE | Admit: 2023-01-30 | Discharge: 2023-01-30 | Disposition: A | Discharge status: Home / Self Care | Payer: Medicare PPO | Source: Ambulatory Visit | Attending: Internal Medicine | Admitting: Internal Medicine

## 2023-01-30 ENCOUNTER — Other Ambulatory Visit (HOSPITAL_COMMUNITY): Payer: Self-pay

## 2023-01-30 VITALS — BP 136/82 | HR 61 | Ht 76.0 in | Wt 214.0 lb

## 2023-01-30 DIAGNOSIS — I428 Other cardiomyopathies: Secondary | ICD-10-CM | POA: Insufficient documentation

## 2023-01-30 DIAGNOSIS — Z87891 Personal history of nicotine dependence: Secondary | ICD-10-CM | POA: Insufficient documentation

## 2023-01-30 DIAGNOSIS — I480 Paroxysmal atrial fibrillation: Secondary | ICD-10-CM | POA: Diagnosis not present

## 2023-01-30 DIAGNOSIS — I5023 Acute on chronic systolic (congestive) heart failure: Secondary | ICD-10-CM | POA: Insufficient documentation

## 2023-01-30 DIAGNOSIS — N1831 Chronic kidney disease, stage 3a: Secondary | ICD-10-CM | POA: Insufficient documentation

## 2023-01-30 DIAGNOSIS — I252 Old myocardial infarction: Secondary | ICD-10-CM | POA: Insufficient documentation

## 2023-01-30 DIAGNOSIS — Z79899 Other long term (current) drug therapy: Secondary | ICD-10-CM | POA: Diagnosis not present

## 2023-01-30 DIAGNOSIS — I5022 Chronic systolic (congestive) heart failure: Secondary | ICD-10-CM | POA: Diagnosis not present

## 2023-01-30 DIAGNOSIS — Z7901 Long term (current) use of anticoagulants: Secondary | ICD-10-CM | POA: Diagnosis not present

## 2023-01-30 DIAGNOSIS — E785 Hyperlipidemia, unspecified: Secondary | ICD-10-CM | POA: Diagnosis not present

## 2023-01-30 DIAGNOSIS — I493 Ventricular premature depolarization: Secondary | ICD-10-CM | POA: Insufficient documentation

## 2023-01-30 DIAGNOSIS — I2510 Atherosclerotic heart disease of native coronary artery without angina pectoris: Secondary | ICD-10-CM | POA: Diagnosis not present

## 2023-01-30 DIAGNOSIS — I130 Hypertensive heart and chronic kidney disease with heart failure and stage 1 through stage 4 chronic kidney disease, or unspecified chronic kidney disease: Secondary | ICD-10-CM | POA: Diagnosis not present

## 2023-01-30 LAB — BASIC METABOLIC PANEL
Anion gap: 6 (ref 5–15)
BUN: 10 mg/dL (ref 8–23)
CO2: 24 mmol/L (ref 22–32)
Calcium: 9.4 mg/dL (ref 8.9–10.3)
Chloride: 112 mmol/L — ABNORMAL HIGH (ref 98–111)
Creatinine, Ser: 1.23 mg/dL (ref 0.61–1.24)
GFR, Estimated: >60 mL/min (ref >60)
Glucose, Bld: 88 mg/dL (ref 70–99)
Potassium: 4.8 mmol/L (ref 3.5–5.1)
Sodium: 142 mmol/L (ref 135–145)

## 2023-01-30 LAB — DIGOXIN LEVEL: Digoxin Level: <0.2 ng/mL — ABNORMAL LOW (ref 0.8–2.0)

## 2023-01-30 LAB — BRAIN NATRIURETIC PEPTIDE: B Natriuretic Peptide: 1087.8 pg/mL — ABNORMAL HIGH (ref 0.0–100.0)

## 2023-01-30 MED ORDER — OMEPRAZOLE 20 MG PO CPDR: 20.0000 mg | DELAYED_RELEASE_CAPSULE | Freq: Every day | ORAL | 3 refills | Status: AC

## 2023-01-30 MED ORDER — PRAVASTATIN SODIUM 40 MG PO TABS: 40.0000 mg | ORAL_TABLET | Freq: Every day | ORAL | 2 refills | Status: DC

## 2023-01-30 MED ORDER — SPIRONOLACTONE 25 MG PO TABS
25.0000 mg | ORAL_TABLET | Freq: Every day | ORAL | 3 refills | Status: DC
Start: 2023-01-30 — End: 2023-03-20

## 2023-01-30 MED ORDER — LOSARTAN POTASSIUM 50 MG PO TABS: 50.0000 mg | ORAL_TABLET | Freq: Every day | ORAL | 3 refills | Status: DC

## 2023-01-30 MED ORDER — MEXILETINE HCL 200 MG PO CAPS: 200.0000 mg | ORAL_CAPSULE | Freq: Two times a day (BID) | ORAL | 3 refills | Status: AC

## 2023-01-30 MED ORDER — TORSEMIDE 20 MG PO TABS: 20.0000 mg | ORAL_TABLET | Freq: Every day | ORAL | 3 refills | Status: DC

## 2023-01-30 NOTE — Progress Notes (Signed)
Refills and new/updated RXs sent in to pharmacy. Spent time reviewing and explaining medications, directions, and dosages. Pt verbalized understanding and reports he will call for any questions/issues/concerns. Meredith Staggers, RN, BSN, [facility] Coordinator Advanced [facility] Failure [facility]

## 2023-01-30 NOTE — Progress Notes (Addendum)
ADVANCED HF CLINIC NOTE   Primary Care: [name], NP HF Cardiologist: Dr. [name]  HPI: Austin [name] is a 67 y.o. old with a history of chronic HFrEF, HTN, HLD, nonobstructive CAD 2012, CKD Stage III, PAF, and single chamber Biotronik .    EF initially down 2012 to 20%. EF improved in 2013 to 50-55%.    Cath (2015): LCX 20% OMI, otherwise ok.  Echo (2022): EF 25-30% RV normal.  Echo (03/2022): EF 15-20% RV normal.    Admitted to [facility] Jan, Feb, March, April, and May of 2024 with A/C HFrEF. Last noted to be in SR in March 2024.   He decided he wanted to come back to [name] to re-establish with Dr [name] and Heart Failure Team. Had been having trouble paying for medications.    Admitted 6/24 with a/c HF and AF. Echo showed EF < 20% RV normal. Diuresed with IV lasix, given IV iron and started on IV amiodarone. R/LHC showed minimal CAD, severe NICM EF 10%, mildly elevated filling pressures and severely reduced CI 1.9. AF was rate controlled, with frequent PVCs. Chemically converted to NSR on amio. LVAD work up initiated. Drips weaned and GDMT titrated. He was discharged home, weight 204 lbs. Plan to repeat echo in 4-6 weeks after GDMT and PVC suppression, with eye toward LVAD if EF not improved.  Post hospital follow up, found to be back in AF, rate controlled.  NYHA I-II, weight up 12 lbs but volume stable with REDs 31%. He was on amiodarone 200 bid, had missed some Eliquis doses so arranged for TEE/DCCV.   S/p TEE 08/26/22 LVEF < 20%, global HK, RV moderate HK, mild to moderate MR with posterior leaflet retracted, moderate TR, no LAA thrombus  Was admitted in 11/24 with cardiogenic shock. Echo 11/24 EF < 20%. Treated with milrinone.  EF Device interrogation showed that he developed AV block in 10/24 and RV pacing went up > 70%. The long AV delay causing frequent paced beats and dropped beats as well. Underwent upgrade to CRT device with RA lead as well   Here for f/u.  Says he feels great. Has more energy. Can do all ADLs without problem. No problems with steps. No CP, edema, orthopnea or PND.   ICD interrogation: Remains in NSR. No VT. 89% CRT. 11% PVCs Personally reviewed   Cardiac Studies - TEE (7/24): LVEF < 20%, global HK, RV moderate HK, mild to moderate MR with posterior leaflet retracted, moderate TR, no LAA thrombus - R/LHC (6/24): minimal CAD, severe NICM EF 10% RA 9, PA 34/19 ( 27), PCWP 20 (v waves to 30), CO/CI (thermo) 4.3/1.9, PVR 1.6 - Echo (6/24): EF < 20%, echodensity on device in RV, RV moderately down, mild MR, ascending aorta 40 mm - Echo (2/24): EF 15-20% RV normal.  - Echo (2022): EF 25-30% RV normal.  - Cath (2015): LCX 20% OMI, otherwise ok.  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   Single Chamber Biotronik   Myocardial  infarction [facility]) 2012   12/31/2013   Nonischemic cardiomyopathy (HCC) 11/15/2010   cath 12/10/10 minimal nonobstructive coronary artery disease   Olecranon bursitis    Squamous cell carcinoma of arm, 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     albuterol (VENTOLIN HFA) 108 (90 Base) MCG/ACT inhaler Inhale 1-2 puffs into the lungs  every 6 (six) hours as needed.     apixaban (ELIQUIS) 5 MG TABS tablet Take 1 tablet (5 mg total) by mouth 2 (two) times daily. Start taking on 01/17/23 pm     Cholecalciferol 50 MCG (2000 UT) TABS Take 1 tablet by mouth daily.     digoxin (LANOXIN) 0.125 MG tablet Take 1 tablet (0.125 mg total) by mouth daily. 90 tablet 3   fenofibrate micronized (LOFIBRA) 67 MG capsule Take 67 mg by mouth every morning.     ferrous sulfate 325 (65 FE) MG tablet Take 325 mg by mouth.     finasteride (PROSCAR) 5 MG tablet Take 1 tablet by mouth daily.     HYDROcodone-acetaminophen (NORCO/VICODIN) 5-325 MG tablet Take 1-2 tablets by mouth every 6 (six) hours as needed for moderate pain (pain score 4-6).     JARDIANCE 10 MG TABS tablet Take 1 tablet (10 mg total) by mouth daily. 30 tablet 11   losartan (COZAAR) 25 MG tablet Take 1 tablet (25 mg total) by mouth daily. 90 tablet 3   melatonin 3 MG TABS tablet Take 6 mg by mouth at bedtime.     mexiletine (MEXITIL) 150 MG capsule Take 1 capsule (150 mg total) by mouth every 12 (twelve) hours. 60 capsule 0   omeprazole (PRILOSEC) 20 MG capsule Take 20 mg by mouth daily.     ondansetron (ZOFRAN) 4 MG tablet Take 4 mg by mouth daily as needed for nausea or vomiting.     polyethylene glycol (MIRALAX / GLYCOLAX) 17 g packet Take 17 g by mouth daily.     potassium chloride (KLOR-CON) 10 MEQ tablet Take 1 tablet (10 mEq total) by mouth every Monday, Wednesday, and Friday. 30 tablet 3   spironolactone (ALDACTONE) 25 MG tablet Take 1 tablet (25 mg total) by mouth daily. 90 tablet 3   Tamsulosin HCl (FLOMAX) 0.4 MG CAPS Take 0.4 mg by mouth daily after supper.     torsemide (DEMADEX) 20 MG tablet Take 1 tablet (20 mg total) by mouth every Monday, Wednesday, and Friday. 90 tablet 3   pravastatin (PRAVACHOL) 40 MG tablet Take 1 tablet (40 mg total) by mouth daily. 90 tablet 2   No current facility-administered medications for this encounter.   Allergies  Allergen Reactions    Lisinopril Cough   Social History   Socioeconomic History   Marital status: Single    Spouse name: Not on file   Number of children: Not on file   Years of education: Not on file   Highest education level: Not on file  Occupational History   Not on file  Tobacco Use   Smoking status: Former    Current packs/day: 0.00    Average packs/day: 2.0 packs/day for 14.0 years (28.0 ttl pk-yrs)    Types: Cigarettes  Start date: 02/15/1979    Quit date: 02/14/1993    Years since quitting: 29.9   Smokeless tobacco: Not on file  Substance and Sexual Activity   Alcohol use: Yes    Comment: occasionally, once a month   Drug use: Not on file   Sexual activity: Not on file  Other Topics Concern   Not on file  Social History Narrative   He is single and lives in [place].  He drives a refrigerator truck.   Social Drivers of Corporate investment banker Strain: Low Risk  (11/01/2022)   Received from [facility]   Overall Financial Resource Strain (CARDIA)    Difficulty of Paying Living Expenses: Not hard at all  Food Insecurity: No Food Insecurity (01/05/2023)   Hunger Vital Sign    Worried About Running Out of Food in the Last Year: Never true    Ran Out of Food in the Last Year: Never true  Transportation Needs: No Transportation Needs (01/05/2023)   PRAPARE - Administrator, Civil Service (Medical): No    Lack of Transportation (Non-Medical): No  Physical Activity: Sufficiently Active (11/01/2022)   Received from [facility]   Exercise Vital Sign    Days of Exercise per Week: 3 days    Minutes of Exercise per Session: 150+ min  Stress: No Stress Concern Present (11/01/2022)   Received from [facility] of Occupational Health - Occupational Stress Questionnaire    Feeling of Stress : Not at all  Social Connections: Socially Integrated (11/01/2022)   Received from [facility]   Social Network    How would you rate your social network (family,  work, friends)?: Good participation with social networks  Intimate Partner Violence: Not At Risk (01/05/2023)   Humiliation, Afraid, Rape, and Kick questionnaire    Fear of Current or Ex-Partner: No    Emotionally Abused: No    Physically Abused: No    Sexually Abused: No   Family History  Problem Relation Age of Onset   Breast cancer Mother    Cancer Father    Heart attack Brother    Heart attack Brother 42   BP 136/82   Pulse 61   Ht 6\' 4"  (1.93 m)   Wt 97.1 kg (214 lb)   SpO2 100%   BMI 26.05 kg/m   Wt Readings from Last 3 Encounters:  01/30/23 97.1 kg (214 lb)  01/14/23 92 kg (202 lb 13.2 oz)  11/07/22 99.6 kg (219 lb 9.6 oz)   PHYSICAL EXAM: General:  Well appearing. No resp difficulty HEENT: normal Neck: supple. no JVD. Carotids 2+ bilat; no bruits. No lymphadenopathy or thryomegaly appreciated. Cor: PMI nondisplaced. Regular rate & rhythm. No rubs, gallops or murmurs. Lungs: clear Abdomen: soft, nontender, nondistended. No hepatosplenomegaly. No bruits or masses. Good bowel sounds. Extremities: no cyanosis, clubbing, rash, edema Neuro: alert & orientedx3, cranial nerves grossly intact. moves all 4 extremities w/o difficulty. Affect pleasant   ASSESSMENT & PLAN: 1. Acute on chronic Systolic Heart Failure -> cardiogenic shock - NICM  - Initially diagnosed with HFrEF in 2012. EF previously improved to 50-55% on medications.  - Echo (6/24): EF now < 20%. He does have single chamber Biotronik.  - R/LHC (7/24) w/ minimal CAD, mildly elevated filling pressures and severely reduced CO/CI by thermo 4.3/1.9.  - Echo 11/24 EF < 20% -> shock - ICD interrogated 11/24 ->he developed AV block in 10/24 and RV pacing went up > 70%  reculting in SR with long AV delay causing frequent paced beats and dropped beats as well.  EP appreciated. Plan for CRT device with RA lead as well.  - Suspect HF triggered by increase in RV pacing and poor atrial sensing - Improved NYHA II. Volume  status ok on torsemide 20 daily Take extra as needed  - Continue Jardiance 10 - will contact pharmD to help with financial assistance - Continue digoxin 0.125 mg daily. - Continue spironolactone 25 mg daily  - Increase losartan to 50 daily - He has advanced HF - now improved with CRT. He is adamant that he does not want VAD if it came to that.  - Needs repeat echo at next visit. Can also get at [facility] (I have suggested he see Dr. Malissa Hippo there) - Management of PVCs and CRT as below    2. Paroxysmal A fib  - Remains in NSR - Continue Eliquis   3. CKD Stage IIIa - Baseline SCr 1.2-1.4.  - Labs today   4. ?RV Lead vegetation - possible that this is due to the "curl-i-cue" that the lead forms in the right heart, as noted on CXR.  - No vegetation on TEE 7/24   5. PVCs - High PVC burden. ~ 50% during prior admission.  - Device interrogation tday 11% PVCs which are limiting effective CRT. Increase mexilitene to 200 bid - Has f/u with EP   6. Empyhsema - Noted on CT chest

## 2023-01-30 NOTE — Patient Instructions (Signed)
Medication Changes:  Increase Losartan to 50 mg Daily  Increase Mexiletine to 200 mg Twice daily   Continue Torsemide 20 mg Daily  Lab Work:  Labs done today, your results will be available in MyChart, we will contact you for abnormal readings.   Testing/Procedures:  Your physician has requested that you have an echocardiogram. Echocardiography is a painless test that uses sound waves to create images of your heart. It provides your doctor with information about the size and shape of your heart and how well your heart's chambers and valves are working. This procedure takes approximately one hour. There are no restrictions for this procedure. Please do NOT wear cologne, perfume, aftershave, or lotions (deodorant is allowed). Please arrive 15 minutes prior to your appointment time. IN 3 MONTHS  Please note: We ask at that you not bring children with you during ultrasound (echo/ vascular) testing. Due to room size and safety concerns, children are not allowed in the ultrasound rooms during exams. Our front office staff cannot provide observation of children in our lobby area while testing is being conducted. An adult accompanying a patient to their appointment will only be allowed in the ultrasound room at the discretion of the ultrasound technician under special circumstances. We apologize for any inconvenience.   Special Instructions // Education:  Do the following things EVERYDAY: Weigh yourself in the morning before breakfast. Write it down and keep it in a log. Take your medicines as prescribed Eat low salt foods--Limit salt (sodium) to 2000 mg per day.  Stay as active as you can everyday Limit all fluids for the day to less than 2 liters   Follow-Up in: 3 months with an echocardiogram   At the [facility], you and your health needs are our priority. We have a designated team specialized in the treatment of Heart Failure. This Care Team includes your primary  Heart Failure Specialized Cardiologist (physician), Advanced Practice Providers (APPs- Physician Assistants and Nurse Practitioners), and Pharmacist who all work together to provide you with the care you need, when you need it.   You may see any of the following providers on your designated Care Team at your next follow up:  Dr. Arvilla Meres Dr. Marca Ancona Dr. Dorthula Nettles Dr. Theresia Bough Tonye Becket, NP Robbie Lis, [place] [facility] [place], [place] Brynda Peon, NP [place] Lee, NP Karle Plumber, PharmD   Please be sure to bring in all your medications bottles to every appointment.   Need to Contact us:  If you have any questions or concerns before your next appointment please send [place] a message through [place] or call our office at [phone].    TO LEAVE A MESSAGE FOR THE NURSE SELECT OPTION 2, PLEASE LEAVE A MESSAGE INCLUDING: YOUR NAME DATE OF BIRTH CALL BACK NUMBER REASON FOR CALL**this is important as we prioritize the call backs  YOU WILL RECEIVE A CALL BACK THE SAME DAY AS LONG AS YOU CALL BEFORE 4:00 PM

## 2023-02-10 NOTE — Telephone Encounter (Signed)
A  thx noted DB increased his Mex  Has appt 2/28 or so Can we get a repeat interrogation in about 4 weeks fro PVC burden  Thanks SK

## 2023-03-07 ENCOUNTER — Telehealth: Payer: Self-pay

## 2023-03-07 NOTE — Telephone Encounter (Signed)
BIOTRONIK ALERT Increased atrial burden.  BIVP 94%, controlled v rates.

## 2023-03-07 NOTE — Telephone Encounter (Signed)
Patient states that he feels fine. "Better than he has in years", but does notice short periods where heart beats really fast but they are short and then go away.  Maybe some fast heart rates from time to time but denies any fatigue, SOB or chest pain.  Ventricular rates well controlled.  BIVP 94%.   States he is taking his medications daily, no missed doses. No changes in diet or health.  Patient's appt with Dr. Graciela Husbands 04/14/23.  Will continue to monitor.

## 2023-03-14 NOTE — Telephone Encounter (Signed)
03/14/23 Transmission reviewed at Dr. Odessa Fleming request:   PVC burden has noticeably decreased. BIVP % is now up to 93-96%.  There is; however, noted increase in atrial burden in recent days, has known AF, is on Eliquis.  Ventricular rates controlled. Will continue to monitor.   Patient sees Dr. Graciela Husbands on 04/14/23.

## 2023-03-15 NOTE — Telephone Encounter (Signed)
DAn  this guy is in persistent AFib,  would think about DCCV   Do you want to see him and do, or are you ok with me sending him to AF clinic Thanks SK

## 2023-03-16 NOTE — Telephone Encounter (Addendum)
Attempted to reach patient to inform him of Dr. Klein/Dr. Bensimhon's recommendations for AFIB clinic and for (AF clinic) to arrange DC - CV on Bensimhon's schedule.   No answer and unable to leave a VM.  Will send to Device Team to attempt recall tomorrow.

## 2023-03-17 ENCOUNTER — Telehealth: Payer: Self-pay

## 2023-03-17 NOTE — Telephone Encounter (Signed)
Returned call to Pt.  Advised he continues to be in afib.  Advised per recommendation of Dr. Graciela Husbands and Dr. Unice Bailey should be seen by afib clinic for evaluation and scheduling of DCCV with Dr. [name].  Pt in agreement.  Pt scheduled for afib clinic appointment 03/20/2023 at 8:30 am.

## 2023-03-17 NOTE — Telephone Encounter (Signed)
error 

## 2023-03-20 ENCOUNTER — Other Ambulatory Visit (HOSPITAL_COMMUNITY): Payer: Self-pay | Admitting: Internal Medicine

## 2023-03-20 ENCOUNTER — Ambulatory Visit (HOSPITAL_COMMUNITY)
Admission: RE | Admit: 2023-03-20 | Discharge: 2023-03-20 | Discharge status: Home / Self Care | Payer: Medicare PPO | Source: Ambulatory Visit | Attending: Internal Medicine | Admitting: Internal Medicine

## 2023-03-20 ENCOUNTER — Encounter (HOSPITAL_COMMUNITY): Payer: Self-pay | Admitting: Internal Medicine

## 2023-03-20 VITALS — BP 110/80 | HR 72 | Ht 76.0 in | Wt 209.2 lb

## 2023-03-20 DIAGNOSIS — Z7901 Long term (current) use of anticoagulants: Secondary | ICD-10-CM | POA: Diagnosis not present

## 2023-03-20 DIAGNOSIS — Z79899 Other long term (current) drug therapy: Secondary | ICD-10-CM | POA: Insufficient documentation

## 2023-03-20 DIAGNOSIS — I480 Paroxysmal atrial fibrillation: Secondary | ICD-10-CM

## 2023-03-20 DIAGNOSIS — E785 Hyperlipidemia, unspecified: Secondary | ICD-10-CM | POA: Diagnosis not present

## 2023-03-20 DIAGNOSIS — D6869 Other thrombophilia: Secondary | ICD-10-CM | POA: Insufficient documentation

## 2023-03-20 DIAGNOSIS — N183 Chronic kidney disease, stage 3 unspecified: Secondary | ICD-10-CM | POA: Diagnosis not present

## 2023-03-20 DIAGNOSIS — I130 Hypertensive heart and chronic kidney disease with heart failure and stage 1 through stage 4 chronic kidney disease, or unspecified chronic kidney disease: Secondary | ICD-10-CM | POA: Diagnosis not present

## 2023-03-20 DIAGNOSIS — I2510 Atherosclerotic heart disease of native coronary artery without angina pectoris: Secondary | ICD-10-CM | POA: Insufficient documentation

## 2023-03-20 DIAGNOSIS — I5022 Chronic systolic (congestive) heart failure: Secondary | ICD-10-CM | POA: Insufficient documentation

## 2023-03-20 DIAGNOSIS — I4819 Other persistent atrial fibrillation: Secondary | ICD-10-CM | POA: Diagnosis present

## 2023-03-20 LAB — CBC
HCT: 33.8 % — ABNORMAL LOW (ref 39.0–52.0)
Hemoglobin: 9.5 g/dL — ABNORMAL LOW (ref 13.0–17.0)
MCH: 22.4 pg — ABNORMAL LOW (ref 26.0–34.0)
MCHC: 28.1 g/dL — ABNORMAL LOW (ref 30.0–36.0)
MCV: 79.5 fL — ABNORMAL LOW (ref 80.0–100.0)
Platelets: 261 10*3/uL (ref 150–400)
RBC: 4.25 MIL/uL (ref 4.22–5.81)
RDW: 19.4 % — ABNORMAL HIGH (ref 11.5–15.5)
WBC: 7.7 10*3/uL (ref 4.0–10.5)
nRBC: 0 % (ref 0.0–0.2)

## 2023-03-20 LAB — BASIC METABOLIC PANEL
Anion gap: 11 (ref 5–15)
BUN: 15 mg/dL (ref 8–23)
CO2: 23 mmol/L (ref 22–32)
Calcium: 9.9 mg/dL (ref 8.9–10.3)
Chloride: 105 mmol/L (ref 98–111)
Creatinine, Ser: 1.4 mg/dL — ABNORMAL HIGH (ref 0.61–1.24)
GFR, Estimated: 54 mL/min — ABNORMAL LOW (ref >60)
Glucose, Bld: 102 mg/dL — ABNORMAL HIGH (ref 70–99)
Potassium: 4.6 mmol/L (ref 3.5–5.1)
Sodium: 139 mmol/L (ref 135–145)

## 2023-03-20 NOTE — Progress Notes (Addendum)
Primary Care Physician: [name], NP Primary Cardiologist: Dr. [name] Electrophysiologist: Dr. Graciela Husbands    Referring Physician: Dr. Theone Murdoch is a 68 y.o. male with a history of chronic HFrEF, CHB s/p previously implanted device / atrial undersending s/p BIV upgrade by Dr. Graciela Husbands on 01/13/23, HTN, HLD, nonobstructive CAD, CKD stage III, atrial fibrillation who presents for consultation in the [facility]. Device alert on 03/07/23 for Afib with controlled VR; notes show cardioversion to be scheduled with Dr. [name]. Patient is on Eliquis 5 mg BID for a CHADS2VASC score of 4.  On evaluation today, he is currently in V paced rhythm. He notes to have less stamina / endurance while in Afib. No missed doses of Eliquis. He notes torsemide is working and making him void often. He is on mexiletine 200 mg BID.   Today, he denies symptoms of palpitations, chest pain, shortness of breath, orthopnea, PND, lower extremity edema, dizziness, presyncope, syncope, snoring, daytime somnolence, bleeding, or neurologic sequela. The patient is tolerating medications without difficulties and is otherwise without complaint today.   he has a BMI of Body mass index is 25.46 kg/m.Marland Kitchen Filed Weights   03/20/23 0838  Weight: 94.9 kg    Current Outpatient Medications  Medication Sig Dispense Refill   acetaminophen (TYLENOL) 325 MG tablet Take 2 tablets (650 mg total) by mouth every 4 (four) hours as needed for moderate pain (pain score 4-6).     albuterol (VENTOLIN HFA) 108 (90 Base) MCG/ACT inhaler Inhale 1-2 puffs into the lungs every 6 (six) hours as needed.     apixaban (ELIQUIS) 5 MG TABS tablet Take 1 tablet (5 mg total) by mouth 2 (two) times daily. Start taking on 01/17/23 pm     Cholecalciferol 50 MCG (2000 UT) TABS Take 1 tablet by mouth daily.     digoxin (LANOXIN) 0.125 MG tablet Take 1 tablet (0.125 mg total) by mouth daily. 90 tablet 3    empagliflozin (JARDIANCE) 25 MG TABS tablet Take 10 mg by mouth daily.     fenofibrate micronized (LOFIBRA) 67 MG capsule Take 67 mg by mouth every morning.     ferrous sulfate 325 (65 FE) MG tablet Take 325 mg by mouth.     losartan (COZAAR) 100 MG tablet Take 1 tablet by mouth daily.     melatonin 3 MG TABS tablet Take 6 mg by mouth at bedtime.     mexiletine (MEXITIL) 200 MG capsule Take 1 capsule (200 mg total) by mouth every 12 (twelve) hours. 60 capsule 3   omeprazole (PRILOSEC) 20 MG capsule Take 1 capsule (20 mg total) by mouth daily. 90 capsule 3   ondansetron (ZOFRAN) 4 MG tablet Take 4 mg by mouth daily as needed for nausea or vomiting.     polyethylene glycol (MIRALAX / GLYCOLAX) 17 g packet Take 17 g by mouth daily.     pravastatin (PRAVACHOL) 40 MG tablet Take 1 tablet (40 mg total) by mouth daily. 90 tablet 2   torsemide (DEMADEX) 20 MG tablet Take 1 tablet (20 mg total) by mouth daily. 30 tablet 3   No current facility-administered medications for this encounter.    Atrial Fibrillation Management history:  Previous antiarrhythmic drugs: none Previous cardioversions: none Previous ablations: none Anticoagulation history: Eliquis    ROS- All systems are reviewed and negative except as per the HPI above.  Physical Exam: BP 110/80   Pulse 72   Ht 6'  4" (1.93 m)   Wt 94.9 kg   BMI 25.46 kg/m   GEN: Well nourished, well developed in no acute distress NECK: No JVD; No carotid bruits CARDIAC: Regular rate and rhythm (V paced), no murmurs, rubs, gallops RESPIRATORY:  Clear to auscultation without rales, wheezing or rhonchi  ABDOMEN: Soft, non-tender, non-distended EXTREMITIES:  No edema; No deformity   EKG today demonstrates  Vent. rate 72 BPM PR interval * ms QRS duration 172 ms QT/QTcB 452/494 ms P-R-T axes * -53 114 Ventricular-paced rhythm Abnormal ECG When compared with ECG of 30-Jan-2023 10:18, PREVIOUS ECG IS PRESENT  Echo 01/06/23 demonstrated  1.  Left ventricular ejection fraction, by estimation, is <20%. The left  ventricle has severely decreased function. The left ventricle demonstrates  global hypokinesis. The left ventricular internal cavity size was severely  dilated. Left ventricular  diastolic function could not be evaluated.   2. Right ventricular systolic function is normal. The right ventricular  size is normal. There is normal pulmonary artery systolic pressure.   3. Left atrial size was mildly dilated.   4. Right atrial size was mildly dilated.   5. The mitral valve is normal in structure. Mild mitral valve  regurgitation. No evidence of mitral stenosis.   6. The aortic valve is grossly normal. Aortic valve regurgitation is not  visualized. No aortic stenosis is present.   7. The inferior vena cava is normal in size with greater than 50%  respiratory variability, suggesting right atrial pressure of 3 mmHg.    ASSESSMENT & PLAN CHA2DS2-VASc Score = 4  The patient's score is based upon: CHF History: 1 HTN History: 1 Diabetes History: 0 Stroke History: 0 Vascular Disease History: 1 Age Score: 1 Gender Score: 0       ASSESSMENT AND PLAN: Persistent Atrial Fibrillation (ICD10:  I48.19) The patient's CHA2DS2-VASc score is 4, indicating a 4.8% annual risk of stroke.    He is in V paced rhythm. We discussed device clinic noting persistent Afib on 1/21 and 1/31 ongoing; we discussed the treatment for this to be cardioversion to try to convert him to NSR. After discussion, patient agrees to proceed with cardioversion. Labs drawn today. London Pepper will be held 3 days prior to DCCV. Per notes, DCCV will be scheduled with Dr. [name]. Continue mexiletine 200 mg BID.   Informed Consent   Shared Decision Making/Informed Consent The risks (stroke, cardiac arrhythmias rarely resulting in the need for a temporary or permanent pacemaker, skin irritation or burns and complications associated with conscious sedation including  aspiration, arrhythmia, respiratory failure and death), benefits (restoration of normal sinus rhythm) and alternatives of a direct current cardioversion were explained in detail to Mr. Allyne Gee and he agrees to proceed.      Secondary Hypercoagulable State (ICD10:  D68.69) The patient is at significant risk for stroke/thromboembolism based upon his CHA2DS2-VASc Score of 4.  Continue Apixaban (Eliquis).  No missed doses.     Follow up as scheduled with Dr. Graciela Husbands.    Lake Bells, PA-C  Afib Clinic [facility] [address] [place], [place] [address] [phone]

## 2023-03-20 NOTE — Patient Instructions (Addendum)
Please hold Jardiance 3 days prior to Cardioversion   Cardioversion scheduled for: February 24th 2025    - Arrive at the [facility] and go to admitting at 9:00 am   - Do not eat or drink anything after midnight the night prior to your procedure.   - Take all your morning medication (except diabetic medications) with a sip of water prior to arrival.  - You will not be able to drive home after your procedure.    - Do NOT miss any doses of your blood thinner - if you should miss a dose please notify our office immediately.   - If you feel as if you go back into normal rhythm prior to scheduled cardioversion, please notify our office immediately.   If your procedure is canceled in the cardioversion suite you will be charged a cancellation fee.    Hold below medications 7 days prior to scheduled procedure/anesthesia.  Restart medication on the normal dosing day after scheduled procedure/anesthesia  Dulaglutide (Trulicity) Exenatide extended release (Bydureon bcise) Semaglutide (Ozempic) (WEGOVY)  Tirzepatide (Mounjaro)     Hold below medications 72 hours prior to scheduled procedure/anesthesia. Restart medication on the following day after scheduled procedure/anesthesia Bexagliflozin (Brenzavvy) Canagliflozin (Invokana) Canagliflozin/metformin (Invokamet/Invokamet XR) Dapagliflozin Marcelline Deist) Dapagliflozin/metformin (Xigduo XR) Dapagliflozin/saxagliptin Colbert Coyer) Empagliflozin (Jardiance) Empagliflozin/linagliptin (Glyxambi) Empagliflozin/linagliptin/metformin (Trijardy XR) Empagliflozin/metformin (Synjardy/Synjardy XR) Ertugliflozin (Steglatro) Ertugliflozin/metformin (Segluromet) Ertugliflozin/sitagliptin (Steglujan)    Hold below medications 24 hours prior to scheduled procedure/anesthesia.   Restart medication on the following day after scheduled procedure/anesthesia   Exenatide (Byetta)  Liraglutide (Victoza, Saxenda)  Lixisenatide (Adlyxin)  Semaglutide  (Rybelsus) Polyethylene Glycol Loxenatide        For those patients who have a scheduled procedure/anesthesia on the same day of the week as their dose, hold the medication on the day of surgery.  They can take their scheduled dose the week before.  **Patients on the above medications scheduled for elective procedures that have not held the medication for the appropriate amount of time are at risk of cancellation or change in the anesthetic plan.

## 2023-03-21 NOTE — Telephone Encounter (Signed)
Patient was seen yesterday in AF clinic DCCV scheduled.

## 2023-04-07 ENCOUNTER — Telehealth: Payer: Self-pay | Admitting: Internal Medicine

## 2023-04-07 ENCOUNTER — Other Ambulatory Visit (HOSPITAL_COMMUNITY): Payer: Self-pay

## 2023-04-07 MED ORDER — FERROUS SULFATE 325 (65 FE) MG PO TABS: 325.0000 mg | ORAL_TABLET | ORAL | 11 refills | Status: AC

## 2023-04-07 MED ORDER — SPIRONOLACTONE 25 MG PO TABS: 12.5000 mg | ORAL_TABLET | Freq: Every morning | ORAL | 3 refills | Status: DC

## 2023-04-07 NOTE — Progress Notes (Signed)
Attempted to call pt however their voicemail was full and I was unable to leave a message.

## 2023-04-07 NOTE — Telephone Encounter (Signed)
Patient stated he was return staff call.

## 2023-04-07 NOTE — Telephone Encounter (Signed)
 Done

## 2023-04-07 NOTE — Telephone Encounter (Signed)
Spoke with pt and advised pt phone call was re: upcoming appointment.  Pt requesting review of DCCV instructions scheduled for 04/10/2023. Instructions reviewed.   Pt is also asking for refills on Spironolactone and Ferrous Sulfate.  Pt states these were previously prescribed by the [place].  Pt states he is out of both medications. Pt advised will forward request.  Pt verbalizes understanding and thanked RN for the call.

## 2023-04-09 NOTE — Anesthesia Preprocedure Evaluation (Signed)
 Anesthesia Evaluation  Patient identified by MRN, date of birth, ID band Patient awake    Reviewed: Allergy & Precautions, NPO status , Patient's Chart, lab work & pertinent test results  Airway Mallampati: II  TM Distance: >3 FB Neck ROM: Full    Dental no notable dental hx. (+) Dental Advisory Given   Pulmonary COPD, former smoker   Pulmonary exam normal breath sounds clear to auscultation       Cardiovascular hypertension, + CAD, + Past MI (2012) and +CHF (EF <20%)  Normal cardiovascular exam+ dysrhythmias Atrial Fibrillation + Cardiac Defibrillator  Rhythm:Regular Rate:Normal  Pacer interrogation 12/2022 Normal device function.  BIVP: 88%, note PVC burden 11%.  Will flag for Dr. Odessa Fleming review.  Thresholds, sensing, and impedances consistent with implant measurements. Device programmed at 3.5V for extra safety margin until 3 month visit. Histogram distribution appropriate for patient and level of activity. AT/AF 2.7 %, 1 AFib event (6.5 hours).  No ventricular arrhythmias noted. Patient educated about wound care, arm mobility, lifting restrictions, shock plan. ROV in 3 months with implanting physician    TTE 01/06/2023:  1. Left ventricular ejection fraction, by estimation, is <20%. The left ventricle has severely decreased function. The left ventricle demonstrates global hypokinesis. The left ventricular internal cavity size was severely dilated. Left ventricular diastolic function could not be evaluated.   2. Right ventricular systolic function is normal. The right ventricular size is normal. There is normal pulmonary artery systolic pressure.   3. Left atrial size was mildly dilated.   4. Right atrial size was mildly dilated.   5. The mitral valve is normal in structure. Mild mitral valve regurgitation. No evidence of mitral stenosis.   6. The aortic valve is grossly normal. Aortic valve regurgitation is not visualized. No aortic  stenosis is present.   7. The inferior vena cava is normal in size with greater than 50% respiratory variability, suggesting right atrial pressure of 3 mmHg.   R/LHC 08/10/2022: Assessment: 1. Minimal CAD 2. Severe NICM EF 10% 3. Mildly elevated filling pressures with severely reduced CO by Thermo    Neuro/Psych neg Seizures    GI/Hepatic ,GERD  Medicated,,  Endo/Other    Renal/GU CRFRenal disease   BPH    Musculoskeletal   Abdominal   Peds  Hematology  (+) Blood dyscrasia, anemia Lab Results      Component                Value               Date                      WBC                      6.5                 01/11/2023                HGB                      9.3 (L)             01/11/2023                HCT                      32.0 (L)  01/11/2023                MCV                      76.6 (L)            01/11/2023                PLT                      242                 01/11/2023              Anesthesia Other Findings 68 y.o. male with PMH of HFrEF, HTN, HLD, nonobstructive CAD 2012, CKD Stage III, PAF, and single chamber Biotronik with dual DX lead admitted 11/21 with increased SOB, volume overload that had progressed over several weeks. Imaging review shows loop in ICD lead which may be causing direct trauma.  Pending CRT-D upgrade.  Reproductive/Obstetrics                             Anesthesia Physical Anesthesia Plan  ASA: 4  Anesthesia Plan: General   Post-op Pain Management: Minimal or no pain anticipated   Induction: Intravenous  PONV Risk Score and Plan: 2 and Treatment may vary due to age or medical condition and TIVA  Airway Management Planned: Natural Airway  Additional Equipment:   Intra-op Plan:   Post-operative Plan:   Informed Consent: I have reviewed the patients History and Physical, chart, labs and discussed the procedure including the risks, benefits and alternatives for the proposed  anesthesia with the patient or authorized representative who has indicated his/her understanding and acceptance.     Dental advisory given  Plan Discussed with: CRNA  Anesthesia Plan Comments: (Pt stopped eliquis on Thursday. Hasn't taken diuretic as he ran out days ago. Dr. [name] notified)        Anesthesia Quick Evaluation

## 2023-04-10 ENCOUNTER — Encounter (HOSPITAL_COMMUNITY)
Admission: RE | Disposition: A | Discharge status: Home / Self Care | Payer: Self-pay | Source: Ambulatory Visit | Attending: Internal Medicine

## 2023-04-10 ENCOUNTER — Ambulatory Visit (HOSPITAL_COMMUNITY): Payer: Self-pay | Admitting: Anesthesiology

## 2023-04-10 ENCOUNTER — Ambulatory Visit (HOSPITAL_COMMUNITY)
Admission: RE | Admit: 2023-04-10 | Discharge: 2023-04-10 | Disposition: A | Discharge status: Home / Self Care | Payer: Medicare PPO | Source: Ambulatory Visit | Attending: Internal Medicine | Admitting: Internal Medicine

## 2023-04-10 SURGERY — CARDIOVERSION (CATH LAB)
Anesthesia: General

## 2023-04-10 NOTE — Interval H&P Note (Signed)
 History and Physical Interval Note:  04/10/2023 10:46 AM  Austin [name]  has presented today for surgery, with the diagnosis of AFIB.  The various methods of treatment have been discussed with the patient and family. After consideration of risks, benefits and other options for treatment, the patient has consented to  Procedure(s): CARDIOVERSION (N/A) as a surgical intervention.  The patient's history has been reviewed, patient examined, no change in status, stable for surgery.  I have reviewed the patient's chart and labs.  Questions were answered to the patient's satisfaction.     [name]

## 2023-04-10 NOTE — Progress Notes (Signed)
   Patient presented today for DC-CV. York Spaniel he was called last week and told to hold Eliquis so he has not been taking it.   In looking back at notes it looks like he was told to hold JArdiance but he says he is not taking this because the [place] took him off [place].  I reached out to the [facility] and they said he was taking double dose of JArdiance so they told him to stop one of them   Today I asked him to restart Jardiance and Eliquis. [facility].   We will reschedule DC-CV (Rate stable 70-80s)  Arvilla Meres, MD  10:45 AM

## 2023-04-10 NOTE — H&P (View-Only) (Signed)
   Patient presented today for DC-CV. York Spaniel he was called last week and told to hold Eliquis so he has not been taking it.   In looking back at notes it looks like he was told to hold JArdiance but he says he is not taking this because the [place] took him off [place].  I reached out to the [facility] and they said he was taking double dose of JArdiance so they told him to stop one of them   Today I asked him to restart Jardiance and Eliquis. [facility].   We will reschedule DC-CV (Rate stable 70-80s)  Arvilla Meres, MD  10:45 AM

## 2023-04-13 ENCOUNTER — Telehealth (HOSPITAL_COMMUNITY): Payer: Self-pay | Admitting: Internal Medicine

## 2023-04-13 NOTE — Telephone Encounter (Signed)
 Patient called front office and stated that he would like for Dr. Lewanda Rife nurse to call him and speak with him in regards to his procedure that was canceled with Dr. [name] on 04/10/23.   Patient would like to get this procedure rescheduled.   Please advise.   Thank you.

## 2023-04-13 NOTE — Telephone Encounter (Signed)
 Will confirming timing to reschedule procedure since eliquis restarted and schedule appropriately

## 2023-04-14 ENCOUNTER — Ambulatory Visit: Payer: Medicare PPO | Admitting: Internal Medicine

## 2023-04-17 DIAGNOSIS — Z95810 Presence of automatic (implantable) cardiac defibrillator: Secondary | ICD-10-CM | POA: Insufficient documentation

## 2023-04-18 ENCOUNTER — Ambulatory Visit: Payer: Medicare PPO | Attending: Internal Medicine | Admitting: Internal Medicine

## 2023-04-18 ENCOUNTER — Encounter: Payer: Self-pay | Admitting: Internal Medicine

## 2023-04-18 VITALS — BP 112/66 | HR 70 | Ht 76.0 in | Wt 209.4 lb

## 2023-04-18 DIAGNOSIS — I480 Paroxysmal atrial fibrillation: Secondary | ICD-10-CM

## 2023-04-18 DIAGNOSIS — I5020 Unspecified systolic (congestive) heart failure: Secondary | ICD-10-CM | POA: Diagnosis not present

## 2023-04-18 DIAGNOSIS — Z95810 Presence of automatic (implantable) cardiac defibrillator: Secondary | ICD-10-CM

## 2023-04-18 NOTE — Progress Notes (Signed)
 Patient Care Team: [name], NP as PCP - General (Family Medicine) Cards DB   HPI  Austin [name] is a 68 y.o. male seen in follow-up for an ICD remotely implanted who presented 11/24 with 2: 1 conduction and underwent  CRT upgrade 11/24 with a placement of an atrial lead to replace the function in his Biotronik DX lead.  Frequent PVCs on mexiletine  Interval atrial fibrillation.  Cardioversion was postponed because he missed his Eliquis  FEELs very good  good days and bad days.  But more good days than bad days.  Some shortness of breath.  No chest pain or edema.  History of dark and malodorous stool.  This predates the iron which was just started.  DATE TEST EF   6/24 LHC  <10 % No obst CAD  11/24 Echo   <20% %              Date Cr K Hgb  12/23   14.8  4/24   11.2  2/25 1.4 4.6 9.5 ( Fe Def)             Records and Results Reviewed  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   Single Chamber Biotronik   Myocardial infarction [facility]) 2012   12/31/2013   Nonischemic cardiomyopathy (HCC) 11/15/2010   cath 12/10/10 minimal nonobstructive coronary artery disease   Olecranon bursitis    Squamous cell carcinoma of arm, 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   Systolic heart failure 11/15/2010   echo 12/08/10 EF 20%, severe left ventricular enlargement, mild right ventricular enlargement    Past Surgical History:  Procedure Laterality Date   BIV UPGRADE N/A 01/13/2023   Procedure: BIV UPGRADE;  Surgeon: Duke Salvia, MD;  Location: [facility] INVASIVE CV LAB;   Service: Cardiovascular;  Laterality: N/A;   CARDIOVERSION N/A 08/26/2022   Procedure: CARDIOVERSION;  Surgeon: Dolores Patty, MD;  Location: [facility] INVASIVE CV LAB;  Service: Cardiovascular;  Laterality: N/A;   HAND SURGERY  02/14/1977   left hand machine trauma   Right elbow mass excisional biopsy Right 12/13/2022   RIGHT/LEFT HEART CATH AND CORONARY ANGIOGRAPHY N/A 08/10/2022   Procedure: RIGHT/LEFT HEART CATH AND CORONARY ANGIOGRAPHY;  Surgeon: Dolores Patty, MD;  Location: [facility] INVASIVE CV LAB;  Service: Cardiovascular;  Laterality: N/A;   TEE WITHOUT CARDIOVERSION N/A 08/26/2022   Procedure: TRANSESOPHAGEAL ECHOCARDIOGRAM;  Surgeon: Dolores Patty, MD;  Location: [facility] INVASIVE CV LAB;  Service: Cardiovascular;  Laterality: N/A;   TRANSURETHRAL RESECTION OF PROSTATE N/A 09/09/2021   UPPER EXTREMITY VENOGRAPHY N/A 01/10/2023   Procedure: UPPER EXTREMITY VENOGRAPHY;  Surgeon: Lanier Prude, MD;  Location: [facility] INVASIVE CV LAB;  Service: Cardiovascular;  Laterality: N/A;    Current Meds  Medication Sig   acetaminophen (TYLENOL) 325 MG tablet Take 2 tablets (650 mg total) by mouth every 4 (four) hours as needed for moderate pain (pain score 4-6).   albuterol (VENTOLIN HFA) 108 (90 Base) MCG/ACT inhaler Inhale 1-2 puffs into the lungs  every 6 (six) hours as needed.   apixaban (ELIQUIS) 5 MG TABS tablet Take 1 tablet (5 mg total) by mouth 2 (two) times daily. Start taking on 01/17/23 pm   digoxin (LANOXIN) 0.125 MG tablet Take 1 tablet (0.125 mg total) by mouth daily. (Patient taking differently: Take 0.125 mg by mouth daily at 2 PM.)   fenofibrate (TRICOR) 48 MG tablet Take 48 mg by mouth in the morning and at bedtime.   ferrous sulfate 325 (65 FE) MG tablet Take 1 tablet (325 mg total) by mouth every Monday, Wednesday, and Friday. In the morning   losartan (COZAAR) 100 MG tablet Take 100 mg by mouth in the morning.   melatonin 3 MG TABS tablet Take 6 mg by mouth at bedtime as  needed (sleep).   mexiletine (MEXITIL) 200 MG capsule Take 1 capsule (200 mg total) by mouth every 12 (twelve) hours.   omeprazole (PRILOSEC) 20 MG capsule Take 1 capsule (20 mg total) by mouth daily.   polyethylene glycol (MIRALAX / GLYCOLAX) 17 g packet Take 17 g by mouth daily. (Patient taking differently: Take 17 g by mouth daily as needed (constipation.).)   spironolactone (ALDACTONE) 25 MG tablet Take 0.5 tablets (12.5 mg total) by mouth in the morning.   torsemide (DEMADEX) 20 MG tablet Take 1 tablet (20 mg total) by mouth daily.    Allergies  Allergen Reactions   Lisinopril Cough      Review of Systems negative except from HPI and PMH  Physical Exam BP 112/66   Pulse 70   Ht 6\' 4"  (1.93 m)   Wt 209 lb 6.4 oz (95 kg)   SpO2 99%   BMI 25.49 kg/m  Well developed and well nourished in no acute distress HENT normal E scleral and icterus clear Neck Supple JVP flat; carotids brisk and full Clear to ausculation Regular rate and rhythm, no murmurs gallops or rub Soft with active bowel sounds No clubbing cyanosis  Edema Alert and oriented, grossly normal motor and sensory function Skin Warm and Dry pale  ECG atrial fib @ 70 -/16/45  CrCl cannot be calculated (Patient's most recent lab result is older than the maximum 21 days allowed.).   Assessment and  Plan  Nonischemic cardiomyopathy  Implantable defibrillator biotrnik-CRT-D  Congestive heart failure-chronic-systolic-class 2  Anemia-iron deficiency  Low blood pressure  Atrial fibrillation persistent   PVCs   Persistent atrial fibrillation with anticipation of cardioversion. I reached out to Dr. Dorthea Cove about getting that rescheduled.  Following restoration of sinus rhythm we will have him come back in to the office to see what his heart rate excursion is and to look at his AV delays.  He is significantly anemic which is new over the last 15 months.  I have encouraged him to follow-up with his PCP in the [place]  regarding not only iron supplementation but an evaluation as to the cause of his iron deficiency in this relatively young man.  Euvolemic.  Continue current medications.  PVCs seem largely quiescent based on histograms.  Continue with mexiletine      Current medicines are reviewed at length with the patient today .  The patient does not  have concerns regarding medicines.

## 2023-04-18 NOTE — Patient Instructions (Signed)
 Medication Instructions:  Your physician recommends that you continue on your current medications as directed. Please refer to the Current Medication list given to you today.  *If you need a refill on your cardiac medications before your next appointment, please call your pharmacy*   Lab Work: None ordered.  If you have labs (blood work) drawn today and your tests are completely normal, you will receive your results only by: MyChart Message (if you have MyChart) OR A paper copy in the mail If you have any lab test that is abnormal or we need to change your treatment, we will call you to review the results.   Testing/Procedures: None ordered.    Follow-Up: At [facility], you and your health needs are our priority.  As part of our continuing mission to provide you with exceptional heart care, we have created designated Provider Care Teams.  These Care Teams include your primary Cardiologist (physician) and Advanced Practice Providers (APPs -  Physician Assistants and Nurse Practitioners) who all work together to provide you with the care you need, when you need it.  We recommend signing up for the patient portal called "MyChart".  Sign up information is provided on this After Visit Summary.  MyChart is used to connect with patients for Virtual Visits (Telemedicine).  Patients are able to view lab/test results, encounter notes, upcoming appointments, etc.  Non-urgent messages can be sent to your provider as well.   To learn more about what you can do with MyChart, go to [email or web address].    Your next appointment:   4 months with Francis Dowse, PA-C or [facility]

## 2023-04-28 LAB — CUP PACEART INCLINIC DEVICE CHECK
Date Time Interrogation Session: 20250304091401
Implantable Lead Connection Status: 753985
Implantable Lead Connection Status: 753985
Implantable Lead Connection Status: 753985
Implantable Lead Implant Date: 20220808
Implantable Lead Implant Date: 20241129
Implantable Lead Implant Date: 20241129
Implantable Lead Location: 753858
Implantable Lead Location: 753859
Implantable Lead Location: 753860
Implantable Lead Model: 436910
Implantable Lead Model: 5076
Implantable Lead Serial Number: 81450996
Implantable Pulse Generator Implant Date: 20241129
Pulse Gen Model: 429522
Pulse Gen Serial Number: 84968923

## 2023-05-01 NOTE — Telephone Encounter (Signed)
 Spoke w/pt, he would like to hold off on sch at this time, he is sch to see Dr [name] on Mon 3/24 and will discuss at that time, he also states he saw the [place] and they talked about sch him for tee/dccv but he is not sure he wants it done there and will discuss at OV 3/24.

## 2023-05-01 NOTE — Telephone Encounter (Signed)
 DCCV tentatively resch to Th[place] 3/20 at 11:30 am, pending he has not missed Eliquis since 2/20 and can hold Jardiance Tue/Wed/Thur am.  Attempted to call pt to discuss and Left message to call back

## 2023-05-02 NOTE — Telephone Encounter (Signed)
 Called and spoke with central scheduling- DCCV cancelled. Advised them we will call back to schedule at a later time.

## 2023-05-04 ENCOUNTER — Ambulatory Visit (HOSPITAL_COMMUNITY): Admit: 2023-05-04 | Admitting: Internal Medicine

## 2023-05-04 ENCOUNTER — Encounter (HOSPITAL_COMMUNITY): Payer: Self-pay

## 2023-05-04 DIAGNOSIS — I4819 Other persistent atrial fibrillation: Secondary | ICD-10-CM

## 2023-05-04 SURGERY — CARDIOVERSION (CATH LAB)
Anesthesia: General

## 2023-05-08 ENCOUNTER — Other Ambulatory Visit (HOSPITAL_COMMUNITY): Payer: Self-pay | Admitting: *Deleted

## 2023-05-08 ENCOUNTER — Encounter (HOSPITAL_COMMUNITY): Payer: Self-pay | Admitting: Internal Medicine

## 2023-05-08 ENCOUNTER — Ambulatory Visit (HOSPITAL_BASED_OUTPATIENT_CLINIC_OR_DEPARTMENT_OTHER)
Admission: RE | Admit: 2023-05-08 | Discharge: 2023-05-08 | Disposition: A | Discharge status: Home / Self Care | Payer: Medicare PPO | Source: Ambulatory Visit | Attending: Internal Medicine | Admitting: Internal Medicine

## 2023-05-08 ENCOUNTER — Ambulatory Visit (HOSPITAL_COMMUNITY)
Admission: RE | Admit: 2023-05-08 | Discharge: 2023-05-08 | Disposition: A | Discharge status: Home / Self Care | Payer: Medicare PPO | Source: Ambulatory Visit | Attending: Internal Medicine | Admitting: Internal Medicine

## 2023-05-08 VITALS — BP 126/78 | HR 74 | Ht 76.0 in | Wt 204.8 lb

## 2023-05-08 DIAGNOSIS — I5022 Chronic systolic (congestive) heart failure: Secondary | ICD-10-CM | POA: Diagnosis present

## 2023-05-08 DIAGNOSIS — E785 Hyperlipidemia, unspecified: Secondary | ICD-10-CM | POA: Insufficient documentation

## 2023-05-08 DIAGNOSIS — I130 Hypertensive heart and chronic kidney disease with heart failure and stage 1 through stage 4 chronic kidney disease, or unspecified chronic kidney disease: Secondary | ICD-10-CM | POA: Insufficient documentation

## 2023-05-08 DIAGNOSIS — I493 Ventricular premature depolarization: Secondary | ICD-10-CM

## 2023-05-08 DIAGNOSIS — N1831 Chronic kidney disease, stage 3a: Secondary | ICD-10-CM

## 2023-05-08 DIAGNOSIS — I2510 Atherosclerotic heart disease of native coronary artery without angina pectoris: Secondary | ICD-10-CM | POA: Diagnosis not present

## 2023-05-08 DIAGNOSIS — I480 Paroxysmal atrial fibrillation: Secondary | ICD-10-CM | POA: Insufficient documentation

## 2023-05-08 DIAGNOSIS — I5023 Acute on chronic systolic (congestive) heart failure: Secondary | ICD-10-CM | POA: Diagnosis not present

## 2023-05-08 DIAGNOSIS — I4819 Other persistent atrial fibrillation: Secondary | ICD-10-CM | POA: Diagnosis not present

## 2023-05-08 DIAGNOSIS — J439 Emphysema, unspecified: Secondary | ICD-10-CM | POA: Diagnosis not present

## 2023-05-08 DIAGNOSIS — I509 Heart failure, unspecified: Secondary | ICD-10-CM | POA: Diagnosis present

## 2023-05-08 DIAGNOSIS — I428 Other cardiomyopathies: Secondary | ICD-10-CM | POA: Diagnosis not present

## 2023-05-08 DIAGNOSIS — Z95810 Presence of automatic (implantable) cardiac defibrillator: Secondary | ICD-10-CM | POA: Diagnosis not present

## 2023-05-08 DIAGNOSIS — Z7901 Long term (current) use of anticoagulants: Secondary | ICD-10-CM | POA: Diagnosis not present

## 2023-05-08 DIAGNOSIS — I4891 Unspecified atrial fibrillation: Secondary | ICD-10-CM

## 2023-05-08 LAB — CBC
HCT: 37.7 % — ABNORMAL LOW (ref 39.0–52.0)
Hemoglobin: 10.6 g/dL — ABNORMAL LOW (ref 13.0–17.0)
MCH: 21.1 pg — ABNORMAL LOW (ref 26.0–34.0)
MCHC: 28.1 g/dL — ABNORMAL LOW (ref 30.0–36.0)
MCV: 75 fL — ABNORMAL LOW (ref 80.0–100.0)
Platelets: 284 10*3/uL (ref 150–400)
RBC: 5.03 MIL/uL (ref 4.22–5.81)
RDW: 17.9 % — ABNORMAL HIGH (ref 11.5–15.5)
WBC: 8.2 10*3/uL (ref 4.0–10.5)
nRBC: 0 % (ref 0.0–0.2)

## 2023-05-08 LAB — BASIC METABOLIC PANEL
Anion gap: 5 (ref 5–15)
BUN: 16 mg/dL (ref 8–23)
CO2: 29 mmol/L (ref 22–32)
Calcium: 9.4 mg/dL (ref 8.9–10.3)
Chloride: 106 mmol/L (ref 98–111)
Creatinine, Ser: 1.7 mg/dL — ABNORMAL HIGH (ref 0.61–1.24)
GFR, Estimated: 43 mL/min — ABNORMAL LOW (ref >60)
Glucose, Bld: 106 mg/dL — ABNORMAL HIGH (ref 70–99)
Potassium: 3.9 mmol/L (ref 3.5–5.1)
Sodium: 140 mmol/L (ref 135–145)

## 2023-05-08 LAB — DIGOXIN LEVEL: Digoxin Level: 1.4 ng/mL (ref 0.8–2.0)

## 2023-05-08 LAB — ECHOCARDIOGRAM COMPLETE
AR max vel: 2.96 cm2
AV Peak grad: 5.4 mmHg
Ao pk vel: 1.16 m/s
Area-P 1/2: 4.15 cm2
Calc EF: 6 %
Est EF: <20
S' Lateral: 7.5 cm
Single Plane A2C EF: 9.7 %
Single Plane A4C EF: 5.9 %

## 2023-05-08 LAB — BRAIN NATRIURETIC PEPTIDE: B Natriuretic Peptide: 292.5 pg/mL — ABNORMAL HIGH (ref 0.0–100.0)

## 2023-05-08 NOTE — H&P (View-Only) (Signed)
 ADVANCED HF CLINIC NOTE   Primary Care: [name], NP HF Cardiologist: Dr. [name]  HPI: Mr. Beckles is a 68 y.o. old with a history of chronic HFrEF, HTN, HLD, nonobstructive CAD 2012, CKD Stage III, PAF, and single chamber Biotronik .    EF initially down 2012 to 20%. EF improved in 2013 to 50-55%.    Cath (2015): LCX 20% OMI, otherwise ok.  Echo (2022): EF 25-30% RV normal.  Echo (03/2022): EF 15-20% RV normal.    Admitted to [facility] Jan, Feb, March, April, and May of 2024 with A/C HFrEF. Last noted to be in SR in March 2024.   He decided he wanted to come back to [facility] to re-establish with Dr [name] and Heart Failure Team. Had been having trouble paying for medications.    Admitted 6/24 with a/c HF and AF. Echo showed EF < 20% RV normal. Diuresed with IV lasix, given IV iron and started on IV amiodarone. R/LHC showed minimal CAD, severe NICM EF 10%, mildly elevated filling pressures and severely reduced CI 1.9. AF was rate controlled, with frequent PVCs. Chemically converted to NSR on amio. LVAD work up initiated. Drips weaned and GDMT titrated. He was discharged home, weight 204 lbs. Plan to repeat echo in 4-6 weeks after GDMT and PVC suppression, with eye toward LVAD if EF not improved.  Post hospital follow up, found to be back in AF, rate controlled.  NYHA I-II, weight up 12 lbs but volume stable with REDs 31%. He was on amiodarone 200 bid, had missed some Eliquis doses so arranged for TEE/DCCV.   S/p TEE 08/26/22 LVEF < 20%, global HK, RV moderate HK, mild to moderate MR with posterior leaflet retracted, moderate TR, no LAA thrombus  Was admitted in 11/24 with cardiogenic shock. Echo 11/24 EF < 20%. Treated with milrinone.  EF Device interrogation showed that he developed AV block in 10/24 and RV pacing went up > 70%. The long AV delay causing frequent paced beats and dropped beats as well. Underwent upgrade to CRT device with RA lead as well   Seen in AF  clinic  in 2/25. Back in AF. DC-CV arranged but when he arrived he had accidentally stopped Eliquis and was very confused about the rest of his medications. DC-CV cancelled. I discussed with his cardiologist at the [facility] (Dr. Malissa Hippo) who arranged to see him back and work with him on his meds. Saw Dr. Graciela Husbands (Ep) on 04/18/23. Was still in AF but rates controlled. Was having dark stools and pending GI eval.   Here for f/u. Has been working with Sharyl Nimrod in the [facility] and says he has his meds straightened out. Feels much better. Does all ADLs without too much problem. Walks 25 mins every day. Around 20 mins starts to get SOB and fatigued. No CP, edema, orthopnea or PND. No further dark stools   ICD interrogation: Remains in NSR. No VT. 89% CRT. 11% PVCs Personally reviewed   Cardiac Studies - TEE (7/24): LVEF < 20%, global HK, RV moderate HK, mild to moderate MR with posterior leaflet retracted, moderate TR, no LAA thrombus - R/LHC (6/24): minimal CAD, severe NICM EF 10% RA 9, PA 34/19 ( 27), PCWP 20 (v waves to 30), CO/CI (thermo) 4.3/1.9, PVR 1.6 - Echo (6/24): EF < 20%, echodensity on device in RV, RV moderately down, mild MR, ascending aorta 40 mm - Echo (2/24): EF 15-20% RV normal.  - Echo (2022): EF 25-30% RV normal.  -  Cath (2015): LCX 20% OMI, otherwise ok.  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   Single Chamber Biotronik   Myocardial infarction [facility]) 2012   12/31/2013   Nonischemic cardiomyopathy (HCC) 11/15/2010   cath 12/10/10 minimal nonobstructive coronary  artery disease   Olecranon bursitis    Squamous cell carcinoma of arm, 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     apixaban (ELIQUIS) 5 MG TABS tablet Take 1 tablet (5 mg total) by mouth 2 (two) times daily. Start taking on 01/17/23 pm     digoxin (LANOXIN) 0.125 MG tablet Take 1 tablet (0.125 mg total) by mouth daily. (Patient taking differently: Take 0.125 mg by mouth daily at 2 PM.) 90 tablet 3   fenofibrate (TRICOR) 48 MG tablet Take 48 mg by mouth in the morning and at bedtime.     ferrous sulfate 325 (65 FE) MG tablet Take 1 tablet (325 mg total) by mouth every Monday, Wednesday, and Friday. In the morning 30 tablet 11   losartan (COZAAR) 100 MG tablet Take 100 mg by mouth in the morning.     melatonin 3 MG TABS tablet Take 6 mg by mouth at bedtime as needed (sleep).     mexiletine (MEXITIL) 200 MG capsule Take 1 capsule (200 mg total) by mouth every 12 (twelve) hours. 60 capsule 3   omeprazole (PRILOSEC) 20 MG capsule Take 1 capsule (20 mg total) by mouth daily. 90 capsule 3   polyethylene glycol (MIRALAX / GLYCOLAX) 17 g packet Take 17 g by mouth daily. (Patient taking differently: Take 17 g by mouth daily as needed (constipation.).)     spironolactone (ALDACTONE) 25 MG tablet Take 0.5 tablets (12.5 mg total) by mouth in the morning. 90 tablet 3   torsemide (DEMADEX) 20 MG tablet Take 1 tablet (20 mg total) by mouth daily. 30 tablet 3   No current facility-administered medications for this encounter.   Allergies  Allergen Reactions   Lisinopril Cough   Social History   Socioeconomic History   Marital status: Single    Spouse  name: Not on file   Number of children: Not on file   Years of education: Not on file   Highest education level: Not on file  Occupational History   Not on file  Tobacco Use   Smoking status: Former    Current packs/day: 0.00    Average packs/day: 2.0 packs/day for 14.0 years (28.0 ttl pk-yrs)    Types: Cigarettes    Start date: 02/15/1979    Quit date: 02/14/1993    Years since quitting: 30.2   Smokeless tobacco: Not on file   Tobacco comments:    Former  smoker 03/20/23  Substance and Sexual Activity   Alcohol use: Yes    Alcohol/week: 3.0 standard drinks of alcohol    Types: 1 Cans of beer, 2 Shots of liquor per week    Comment: once a month a beer or couple shots of bourbon 03/20/23   Drug use: Not Currently    Types: Marijuana    Comment: occ smokes marjuana 03/20/23   Sexual activity: Not on file  Other Topics Concern   Not on file  Social History Narrative   He is single and lives in [place].  He drives a refrigerator truck.   Social Drivers of Health   Financial Resource Strain: Patient Declined (03/02/2023)   Received from [facility]   Overall Financial Resource Strain (CARDIA)    Difficulty of Paying Living Expenses: Patient declined  Food Insecurity: Patient Declined (03/02/2023)   Received from [facility]   Hunger Vital Sign    Worried About Running Out of Food in the Last Year: Patient declined    Ran Out of Food in the Last Year: Patient declined  Transportation Needs: Patient Declined (03/02/2023)   Received from [facility] - Transportation    Lack of Transportation (Medical): Patient declined    Lack of Transportation (Non-Medical): Patient declined  Physical Activity: Sufficiently Active (11/01/2022)   [facility]   Exercise Vital Sign    Days of Exercise per Week: 3 days    Minutes of Exercise per Session: 150+ min  Stress: No Stress Concern Present (11/01/2022)   Received from [facility] of  Occupational Health - Occupational Stress Questionnaire    Feeling of Stress : Not at all  Social Connections: Socially Integrated (11/01/2022)   Received from [facility]   Social Network    How would you rate your social network (family, work, friends)?: Good participation with social networks  Intimate Partner Violence: Not At Risk (01/05/2023)   Humiliation, Afraid, Rape, and Kick questionnaire    Fear of Current or Ex-Partner: No    Emotionally Abused: No    Physically Abused: No    Sexually Abused: No   Family History  Problem Relation Age of Onset   Breast cancer Mother    Cancer Father    Heart attack Brother    Heart attack Brother 42   BP 126/78   Pulse 74   Ht 6\' 4"  (1.93 m)   Wt 92.9 kg (204 lb 12.8 oz)   SpO2 96%   BMI 24.93 kg/m   Wt Readings from Last 3 Encounters:  05/08/23 92.9 kg (204 lb 12.8 oz)  04/18/23 95 kg (209 lb 6.4 oz)  04/10/23 96.2 kg (212 lb)   PHYSICAL EXAM: General:  Well appearing. No resp difficulty HEENT: normal Neck: supple. no JVD. Carotids 2+ bilat; no bruits. No lymphadenopathy or thryomegaly appreciated. Cor: PMI nondisplaced. Regular rate & rhythm. No rubs, gallops or murmurs. Lungs: clear Abdomen: soft, nontender, nondistended. No hepatosplenomegaly. No bruits or masses. Good bowel sounds. Extremities: no cyanosis, clubbing, rash, edema Neuro: alert & orientedx3, cranial nerves grossly intact. moves all 4 extremities w/o difficulty. Affect pleasant  AF with v pacing at 70 Personally reviewed   ASSESSMENT & PLAN: 1. Acute on chronic Systolic Heart Failure -> cardiogenic shock - NICM  - Initially diagnosed with HFrEF in 2012. EF previously improved to 50-55% on medications.  - Echo (6/24): EF now < 20%. He does have single chamber Biotronik.  - R/LHC (  7/24) w/ minimal CAD, mildly elevated filling pressures and severely reduced CO/CI by thermo 4.3/1.9.  - Echo 11/24 EF < 20% -> shock - ICD interrogated 11/24 ->he developed  AV block in 10/24 and RV pacing went up > 70% reculting in SR with long AV delay causing frequent paced beats and dropped beats as well.  - CRT upgrade 11/24 with a placement of an atrial lead  - ECHO today 05/08/23 LV markedly dialted EF 25% RV ok Personally reviewed  -  Doing well nYHA II.  - Volume status looks good on torsemide 20 daily  - Continue Jardiance 10  - Continue digoxin 0.125 mg daily. - Continue spironolactone 12.5 mg daily  - Continue  losartan 100 daily - Off b-blocker with low output - will not rechallenge today    2. Paroxysmal A fib  - Back in AF. Rate controlled - DC-CV in 2/25 deferred due to accidentally stopping Eliquis  - Continue Eliquis - Will reschedule DC-CV   3. CKD Stage IIIa - Baseline SCr 1.2-1.4.  - Labs today   4. ?RV Lead vegetation - possible that this is due to the "curl-i-cue" that the lead forms in the right heart, as noted on CXR.  - No vegetation on TEE 7/24   5. PVCs - High PVC burden. ~ 50% during prior admission.  - Device interrogation tday 11% PVCs which are limiting effective CRT.  Continue mexilitene 200 bid. No PVCs on ECG today  - Has f/u with EP   6. Empyhsema - Noted on CT chest

## 2023-05-08 NOTE — Progress Notes (Signed)
 Echocardiogram 2D Echocardiogram has been performed.  [name] 05/08/2023, 1:34 PM

## 2023-05-08 NOTE — Patient Instructions (Signed)
 Great to see you today!!!  Medication Changes:  None, continue current medications  Lab Work:  Labs done today, your results will be available in MyChart, we will contact you for abnormal readings.   Testing/Procedures:  Your physician has recommended that you have a Cardioversion (DCCV). Electrical Cardioversion uses a jolt of electricity to your heart either through paddles or wired patches attached to your chest. This is a controlled, usually prescheduled, procedure. Defibrillation is done under light anesthesia in the hospital, and you usually go home the day of the procedure. This is done to get your heart back into a normal rhythm. You are not awake for the procedure. Please see the instruction sheet given to you today.  Special Instructions // Education:  Do the following things EVERYDAY: Weigh yourself in the morning before breakfast. Write it down and keep it in a log. Take your medicines as prescribed Eat low salt foods--Limit salt (sodium) to 2000 mg per day.  Stay as active as you can everyday Limit all fluids for the day to less than 2 liters      You are scheduled for a Cardioversion on Friday, April 4 with Dr. [name].    Please arrive at the [facility] (Main Entrance A) at [facility]: [address] [place], [place] [address] at 7:30 AM (This time is 1 hour(s) before your procedure to ensure your preparation).   Free valet parking service is available. You will check in at ADMITTING.   *Please Note: You will receive a call the day before your procedure to confirm the appointment time. That time may have changed from the original time based on the schedule for that day.*    DIET:  Nothing to eat or drink after midnight except a sip of water with medications (see medication instructions below)  MEDICATION INSTRUCTIONS:        HOLD: Empagliflozin (Jardiance) for 3 days prior to the procedure. Last dose on Monday, March 31.        Friday 4/4 AM DO NOT  TAKE: Jardiance, Spironolactone, or Torsemide  Continue taking your anticoagulant (blood thinner): Apixaban (Eliquis).  You will need to continue this after your procedure until you are told by your provider that it is safe to stop.    LABS: done today    FYI:  Your support person will be asked to wait in the waiting room during your procedure.  It is OK to have someone drop you off and come back when you are ready to be discharged.  You cannot drive after the procedure and will need someone to drive you home.  Bring your insurance cards.  *Special Note: Every effort is made to have your procedure done on time. Occasionally there are emergencies that occur at the hospital that may cause delays. Please be patient if a delay does occur.    Follow-Up in: 2-3 months  At the [facility], you and your health needs are our priority. We have a designated team specialized in the treatment of Heart Failure. This Care Team includes your primary Heart Failure Specialized Cardiologist (physician), Advanced Practice Providers (APPs- Physician Assistants and Nurse Practitioners), and Pharmacist who all work together to provide you with the care you need, when you need it.   You may see any of the following providers on your designated Care Team at your next follow up:  Dr. Arvilla Meres Dr. Marca Ancona Dr. Dorthula Nettles Dr. Theresia Bough Amy Filbert Schilder, NP Robbie Lis, [place]  [facility] Anna Genre, [place] Brynda Peon, NP [place] Lee, NP Karle Plumber, PharmD   Please be sure to bring in all your medications bottles to every appointment.   Need to Contact us:  If you have any questions or concerns before your next appointment please send [place] a message through [facility] or call our office at [phone].    TO LEAVE A MESSAGE FOR THE NURSE SELECT OPTION 2, PLEASE LEAVE A MESSAGE INCLUDING: YOUR NAME DATE OF BIRTH CALL BACK NUMBER REASON FOR CALL**this is important as  we prioritize the call backs  YOU WILL RECEIVE A CALL BACK THE SAME DAY AS LONG AS YOU CALL BEFORE 4:00 PM

## 2023-05-08 NOTE — Progress Notes (Signed)
 ADVANCED HF CLINIC NOTE   Primary Care: [name], NP HF Cardiologist: Dr. [name]  HPI: Austin [name] is a 68 y.o. old with a history of chronic HFrEF, HTN, HLD, nonobstructive CAD 2012, CKD Stage III, PAF, and single chamber Biotronik .    EF initially down 2012 to 20%. EF improved in 2013 to 50-55%.    Cath (2015): LCX 20% OMI, otherwise ok.  Echo (2022): EF 25-30% RV normal.  Echo (03/2022): EF 15-20% RV normal.    Admitted to [facility] Jan, Feb, March, April, and May of 2024 with A/C HFrEF. Last noted to be in SR in March 2024.   He decided he wanted to come back to [facility] to re-establish with Dr [name] and Heart Failure Team. Had been having trouble paying for medications.    Admitted 6/24 with a/c HF and AF. Echo showed EF < 20% RV normal. Diuresed with IV lasix, given IV iron and started on IV amiodarone. R/LHC showed minimal CAD, severe NICM EF 10%, mildly elevated filling pressures and severely reduced CI 1.9. AF was rate controlled, with frequent PVCs. Chemically converted to NSR on amio. LVAD work up initiated. Drips weaned and GDMT titrated. He was discharged home, weight 204 lbs. Plan to repeat echo in 4-6 weeks after GDMT and PVC suppression, with eye toward LVAD if EF not improved.  Post hospital follow up, found to be back in AF, rate controlled.  NYHA I-II, weight up 12 lbs but volume stable with REDs 31%. He was on amiodarone 200 bid, had missed some Eliquis doses so arranged for TEE/DCCV.   S/p TEE 08/26/22 LVEF < 20%, global HK, RV moderate HK, mild to moderate MR with posterior leaflet retracted, moderate TR, no LAA thrombus  Was admitted in 11/24 with cardiogenic shock. Echo 11/24 EF < 20%. Treated with milrinone.  EF Device interrogation showed that he developed AV block in 10/24 and RV pacing went up > 70%. The long AV delay causing frequent paced beats and dropped beats as well. Underwent upgrade to CRT device with RA lead as well   Seen in AF  clinic  in 2/25. Back in AF. DC-CV arranged but when he arrived he had accidentally stopped Eliquis and was very confused about the rest of his medications. DC-CV cancelled. I discussed with his cardiologist at the [facility] (Dr. Malissa Hippo) who arranged to see him back and work with him on his meds. Saw Dr. Graciela Husbands (Ep) on 04/18/23. Was still in AF but rates controlled. Was having dark stools and pending GI eval.   Here for f/u. Has been working with Sharyl Nimrod in the [facility] and says he has his meds straightened out. Feels much better. Does all ADLs without too much problem. Walks 25 mins every day. Around 20 mins starts to get SOB and fatigued. No CP, edema, orthopnea or PND. No further dark stools   ICD interrogation: Remains in NSR. No VT. 89% CRT. 11% PVCs Personally reviewed   Cardiac Studies - TEE (7/24): LVEF < 20%, global HK, RV moderate HK, mild to moderate MR with posterior leaflet retracted, moderate TR, no LAA thrombus - R/LHC (6/24): minimal CAD, severe NICM EF 10% RA 9, PA 34/19 ( 27), PCWP 20 (v waves to 30), CO/CI (thermo) 4.3/1.9, PVR 1.6 - Echo (6/24): EF < 20%, echodensity on device in RV, RV moderately down, mild MR, ascending aorta 40 mm - Echo (2/24): EF 15-20% RV normal.  - Echo (2022): EF 25-30% RV normal.  -  Cath (2015): LCX 20% OMI, otherwise ok.  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   Single Chamber Biotronik   Myocardial infarction [facility]) 2012   12/31/2013   Nonischemic cardiomyopathy (HCC) 11/15/2010   cath 12/10/10 minimal nonobstructive coronary  artery disease   Olecranon bursitis    Squamous cell carcinoma of arm, 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     apixaban (ELIQUIS) 5 MG TABS tablet Take 1 tablet (5 mg total) by mouth 2 (two) times daily. Start taking on 01/17/23 pm     digoxin (LANOXIN) 0.125 MG tablet Take 1 tablet (0.125 mg total) by mouth daily. (Patient taking differently: Take 0.125 mg by mouth daily at 2 PM.) 90 tablet 3   fenofibrate (TRICOR) 48 MG tablet Take 48 mg by mouth in the morning and at bedtime.     ferrous sulfate 325 (65 FE) MG tablet Take 1 tablet (325 mg total) by mouth every Monday, Wednesday, and Friday. In the morning 30 tablet 11   losartan (COZAAR) 100 MG tablet Take 100 mg by mouth in the morning.     melatonin 3 MG TABS tablet Take 6 mg by mouth at bedtime as needed (sleep).     mexiletine (MEXITIL) 200 MG capsule Take 1 capsule (200 mg total) by mouth every 12 (twelve) hours. 60 capsule 3   omeprazole (PRILOSEC) 20 MG capsule Take 1 capsule (20 mg total) by mouth daily. 90 capsule 3   polyethylene glycol (MIRALAX / GLYCOLAX) 17 g packet Take 17 g by mouth daily. (Patient taking differently: Take 17 g by mouth daily as needed (constipation.).)     spironolactone (ALDACTONE) 25 MG tablet Take 0.5 tablets (12.5 mg total) by mouth in the morning. 90 tablet 3   torsemide (DEMADEX) 20 MG tablet Take 1 tablet (20 mg total) by mouth daily. 30 tablet 3   No current facility-administered medications for this encounter.   Allergies  Allergen Reactions   Lisinopril Cough   Social History   Socioeconomic History   Marital status: Single    Spouse  name: Not on file   Number of children: Not on file   Years of education: Not on file   Highest education level: Not on file  Occupational History   Not on file  Tobacco Use   Smoking status: Former    Current packs/day: 0.00    Average packs/day: 2.0 packs/day for 14.0 years (28.0 ttl pk-yrs)    Types: Cigarettes    Start date: 02/15/1979    Quit date: 02/14/1993    Years since quitting: 30.2   Smokeless tobacco: Not on file   Tobacco comments:    Former  smoker 03/20/23  Substance and Sexual Activity   Alcohol use: Yes    Alcohol/week: 3.0 standard drinks of alcohol    Types: 1 Cans of beer, 2 Shots of liquor per week    Comment: once a month a beer or couple shots of bourbon 03/20/23   Drug use: Not Currently    Types: Marijuana    Comment: occ smokes marjuana 03/20/23   Sexual activity: Not on file  Other Topics Concern   Not on file  Social History Narrative   He is single and lives in [place].  He drives a refrigerator truck.   Social Drivers of Health   Financial Resource Strain: Patient Declined (03/02/2023)   Received from [facility]   Overall Financial Resource Strain (CARDIA)    Difficulty of Paying Living Expenses: Patient declined  Food Insecurity: Patient Declined (03/02/2023)   Received from [facility]   Hunger Vital Sign    Worried About Running Out of Food in the Last Year: Patient declined    Ran Out of Food in the Last Year: Patient declined  Transportation Needs: Patient Declined (03/02/2023)   Received from [facility] - Transportation    Lack of Transportation (Medical): Patient declined    Lack of Transportation (Non-Medical): Patient declined  Physical Activity: Sufficiently Active (11/01/2022)   [facility] from [facility]   Exercise Vital Sign    Days of Exercise per Week: 3 days    Minutes of Exercise per Session: 150+ min  Stress: No Stress Concern Present (11/01/2022)   Received from [facility] of  Occupational Health - Occupational Stress Questionnaire    Feeling of Stress : Not at all  Social Connections: Socially Integrated (11/01/2022)   Received from [facility]   Social Network    How would you rate your social network (family, work, friends)?: Good participation with social networks  Intimate Partner Violence: Not At Risk (01/05/2023)   Humiliation, Afraid, Rape, and Kick questionnaire    Fear of Current or Ex-Partner: No    Emotionally Abused: No    Physically Abused: No    Sexually Abused: No   Family History  Problem Relation Age of Onset   Breast cancer Mother    Cancer Father    Heart attack Brother    Heart attack Brother 42   BP 126/78   Pulse 74   Ht 6\' 4"  (1.93 m)   Wt 92.9 kg (204 lb 12.8 oz)   SpO2 96%   BMI 24.93 kg/m   Wt Readings from Last 3 Encounters:  05/08/23 92.9 kg (204 lb 12.8 oz)  04/18/23 95 kg (209 lb 6.4 oz)  04/10/23 96.2 kg (212 lb)   PHYSICAL EXAM: General:  Well appearing. No resp difficulty HEENT: normal Neck: supple. no JVD. Carotids 2+ bilat; no bruits. No lymphadenopathy or thryomegaly appreciated. Cor: PMI nondisplaced. Regular rate & rhythm. No rubs, gallops or murmurs. Lungs: clear Abdomen: soft, nontender, nondistended. No hepatosplenomegaly. No bruits or masses. Good bowel sounds. Extremities: no cyanosis, clubbing, rash, edema Neuro: alert & orientedx3, cranial nerves grossly intact. moves all 4 extremities w/o difficulty. Affect pleasant  AF with v pacing at 70 Personally reviewed   ASSESSMENT & PLAN: 1. Acute on chronic Systolic Heart Failure -> cardiogenic shock - NICM  - Initially diagnosed with HFrEF in 2012. EF previously improved to 50-55% on medications.  - Echo (6/24): EF now < 20%. He does have single chamber Biotronik.  - R/LHC (  7/24) w/ minimal CAD, mildly elevated filling pressures and severely reduced CO/CI by thermo 4.3/1.9.  - Echo 11/24 EF < 20% -> shock - ICD interrogated 11/24 ->he developed  AV block in 10/24 and RV pacing went up > 70% reculting in SR with long AV delay causing frequent paced beats and dropped beats as well.  - CRT upgrade 11/24 with a placement of an atrial lead  - ECHO today 05/08/23 LV markedly dialted EF 25% RV ok Personally reviewed  -  Doing well nYHA II.  - Volume status looks good on torsemide 20 daily  - Continue Jardiance 10  - Continue digoxin 0.125 mg daily. - Continue spironolactone 12.5 mg daily  - Continue  losartan 100 daily - Off b-blocker with low output - will not rechallenge today    2. Paroxysmal A fib  - Back in AF. Rate controlled - DC-CV in 2/25 deferred due to accidentally stopping Eliquis  - Continue Eliquis - Will reschedule DC-CV   3. CKD Stage IIIa - Baseline SCr 1.2-1.4.  - Labs today   4. ?RV Lead vegetation - possible that this is due to the "curl-i-cue" that the lead forms in the right heart, as noted on CXR.  - No vegetation on TEE 7/24   5. PVCs - High PVC burden. ~ 50% during prior admission.  - Device interrogation tday 11% PVCs which are limiting effective CRT.  Continue mexilitene 200 bid. No PVCs on ECG today  - Has f/u with EP   6. Empyhsema - Noted on CT chest

## 2023-05-11 ENCOUNTER — Other Ambulatory Visit (HOSPITAL_COMMUNITY): Payer: Medicare PPO

## 2023-05-18 NOTE — Progress Notes (Signed)
 Spoke to patient and instructed them to come at [facility]  and to be NPO after 0000.  Medications reviewed.    Confirmed that patient will have a ride home and someone to stay with them for 24 hours after the procedure.

## 2023-05-18 NOTE — Anesthesia Preprocedure Evaluation (Signed)
 Anesthesia Evaluation  Patient identified by MRN, date of birth, ID band Patient awake    Reviewed: Allergy & Precautions, NPO status , Patient's Chart, lab work & pertinent test results  Airway Mallampati: III  TM Distance: >3 FB Neck ROM: Full    Dental  (+) Partial Lower, Partial Upper, Dental Advisory Given   Pulmonary COPD, former smoker   Pulmonary exam normal breath sounds clear to auscultation       Cardiovascular hypertension, + CAD, + Past MI and +CHF  Normal cardiovascular exam+ dysrhythmias (eliquis) Atrial Fibrillation + Cardiac Defibrillator  Rhythm:Irregular Rate:Normal  TTE 2025 1. Left ventricular ejection fraction, by estimation, is <20%. The left  ventricle has severely decreased function. The left ventricle demonstrates  regional wall motion abnormalities (see scoring diagram/findings for  description). The left ventricular  internal cavity size was severely dilated. Left ventricular diastolic  function could not be evaluated.   2. Right ventricular systolic function is normal. The right ventricular  size is normal. There is normal pulmonary artery systolic pressure.   3. Right atrial size was moderately dilated.   4. The mitral valve is normal in structure. Trivial mitral valve  regurgitation. No evidence of mitral stenosis.   5. The aortic valve is tricuspid. Aortic valve regurgitation is not  visualized. No aortic stenosis is present.   6. The inferior vena cava is normal in size with greater than 50%  respiratory variability, suggesting right atrial pressure of 3 mmHg.     Neuro/Psych negative neurological ROS  negative psych ROS   GI/Hepatic negative GI ROS, Neg liver ROS,,,  Endo/Other  negative endocrine ROS    Renal/GU Renal InsufficiencyRenal disease  negative genitourinary   Musculoskeletal negative musculoskeletal ROS (+)    Abdominal   Peds  Hematology negative hematology ROS (+)    Anesthesia Other Findings   Reproductive/Obstetrics                             Anesthesia Physical Anesthesia Plan  ASA: 4  Anesthesia Plan: General   Post-op Pain Management:    Induction: Intravenous  PONV Risk Score and Plan: Propofol infusion and Treatment may vary due to age or medical condition  Airway Management Planned: Natural Airway  Additional Equipment:   Intra-op Plan:   Post-operative Plan:   Informed Consent: I have reviewed the patients History and Physical, chart, labs and discussed the procedure including the risks, benefits and alternatives for the proposed anesthesia with the patient or authorized representative who has indicated his/her understanding and acceptance.     Dental advisory given  Plan Discussed with: CRNA  Anesthesia Plan Comments:        Anesthesia Quick Evaluation

## 2023-05-19 ENCOUNTER — Encounter (HOSPITAL_COMMUNITY): Payer: Self-pay | Admitting: Internal Medicine

## 2023-05-19 ENCOUNTER — Ambulatory Visit (HOSPITAL_BASED_OUTPATIENT_CLINIC_OR_DEPARTMENT_OTHER): Payer: Self-pay

## 2023-05-19 ENCOUNTER — Ambulatory Visit (HOSPITAL_COMMUNITY)
Admission: RE | Admit: 2023-05-19 | Discharge: 2023-05-19 | Disposition: A | Discharge status: Home / Self Care | Source: Ambulatory Visit | Attending: Internal Medicine | Admitting: Internal Medicine

## 2023-05-19 ENCOUNTER — Encounter (HOSPITAL_COMMUNITY)
Admission: RE | Disposition: A | Discharge status: Home / Self Care | Payer: Self-pay | Source: Ambulatory Visit | Attending: Internal Medicine

## 2023-05-19 ENCOUNTER — Other Ambulatory Visit: Payer: Self-pay

## 2023-05-19 ENCOUNTER — Ambulatory Visit (HOSPITAL_COMMUNITY): Payer: Self-pay

## 2023-05-19 DIAGNOSIS — N1831 Chronic kidney disease, stage 3a: Secondary | ICD-10-CM | POA: Diagnosis not present

## 2023-05-19 DIAGNOSIS — I2510 Atherosclerotic heart disease of native coronary artery without angina pectoris: Secondary | ICD-10-CM | POA: Insufficient documentation

## 2023-05-19 DIAGNOSIS — Z8249 Family history of ischemic heart disease and other diseases of the circulatory system: Secondary | ICD-10-CM | POA: Diagnosis not present

## 2023-05-19 DIAGNOSIS — I5023 Acute on chronic systolic (congestive) heart failure: Secondary | ICD-10-CM | POA: Insufficient documentation

## 2023-05-19 DIAGNOSIS — I509 Heart failure, unspecified: Secondary | ICD-10-CM

## 2023-05-19 DIAGNOSIS — I130 Hypertensive heart and chronic kidney disease with heart failure and stage 1 through stage 4 chronic kidney disease, or unspecified chronic kidney disease: Secondary | ICD-10-CM | POA: Diagnosis not present

## 2023-05-19 DIAGNOSIS — Z7901 Long term (current) use of anticoagulants: Secondary | ICD-10-CM | POA: Insufficient documentation

## 2023-05-19 DIAGNOSIS — I428 Other cardiomyopathies: Secondary | ICD-10-CM | POA: Insufficient documentation

## 2023-05-19 DIAGNOSIS — Z79899 Other long term (current) drug therapy: Secondary | ICD-10-CM | POA: Diagnosis not present

## 2023-05-19 DIAGNOSIS — I4891 Unspecified atrial fibrillation: Secondary | ICD-10-CM | POA: Diagnosis present

## 2023-05-19 DIAGNOSIS — Z95810 Presence of automatic (implantable) cardiac defibrillator: Secondary | ICD-10-CM | POA: Diagnosis not present

## 2023-05-19 DIAGNOSIS — I110 Hypertensive heart disease with heart failure: Secondary | ICD-10-CM

## 2023-05-19 DIAGNOSIS — I480 Paroxysmal atrial fibrillation: Secondary | ICD-10-CM | POA: Diagnosis not present

## 2023-05-19 DIAGNOSIS — Z87891 Personal history of nicotine dependence: Secondary | ICD-10-CM | POA: Insufficient documentation

## 2023-05-19 DIAGNOSIS — I493 Ventricular premature depolarization: Secondary | ICD-10-CM | POA: Insufficient documentation

## 2023-05-19 HISTORY — PX: CARDIOVERSION: EP1203

## 2023-05-19 SURGERY — CARDIOVERSION (CATH LAB)
Anesthesia: General

## 2023-05-19 MED ORDER — SODIUM CHLORIDE 0.9% FLUSH
3.0000 mL | Freq: Two times a day (BID) | INTRAVENOUS | Status: DC
Start: 2023-05-19 — End: 2023-05-19

## 2023-05-19 MED ORDER — LIDOCAINE 2% (20 MG/ML) 5 ML SYRINGE
INTRAMUSCULAR | Status: DC | PRN
  Administered 2023-05-19: 20 mg via INTRAVENOUS

## 2023-05-19 MED ORDER — PROPOFOL 10 MG/ML IV BOLUS
INTRAVENOUS | Status: DC | PRN
  Administered 2023-05-19: 10 mg via INTRAVENOUS
  Administered 2023-05-19: 20 mg via INTRAVENOUS
  Administered 2023-05-19: 10 mg via INTRAVENOUS

## 2023-05-19 MED ORDER — ETOMIDATE 2 MG/ML IV SOLN
INTRAVENOUS | Status: DC | PRN
  Administered 2023-05-19: 2 mg via INTRAVENOUS
  Administered 2023-05-19 (×2): 4 mg via INTRAVENOUS

## 2023-05-19 MED ORDER — SODIUM CHLORIDE 0.9% FLUSH
3.0000 mL | INTRAVENOUS | Status: DC | PRN
Start: 2023-05-19 — End: 2023-05-19

## 2023-05-19 SURGICAL SUPPLY — 1 items: PAD DEFIB RADIO PHYSIO CONN (PAD) ×1 IMPLANT

## 2023-05-19 NOTE — Discharge Instructions (Signed)

## 2023-05-19 NOTE — Transfer of Care (Signed)
 Immediate Anesthesia Transfer of Care Note  Patient: Austin [name]  Procedure(s) Performed: CARDIOVERSION  Patient Location: PACU and Cath Lab  Anesthesia Type:MAC  Level of Consciousness: awake, alert , and oriented  Airway & Oxygen Therapy: Patient Spontanous Breathing and Patient connected to face mask oxygen  Post-op Assessment: Report given to RN and Post -op Vital signs reviewed and stable  Post vital signs: Reviewed and stable  Last Vitals:  Vitals Value Taken Time  BP    Temp    Pulse    Resp    SpO2      Last Pain:  Vitals:   05/19/23 0751  TempSrc:   PainSc: 0-No pain         Complications: There were no known notable events for this encounter.

## 2023-05-19 NOTE — CV Procedure (Signed)
    DIRECT CURRENT CARDIOVERSION  NAME:  Austin [name]   MRN: [id number] DOB:  December 11, 1955   ADMIT DATE: 05/19/2023   INDICATIONS: Atrial fibrillation    PROCEDURE:   Informed consent was obtained prior to the procedure. The risks, benefits and alternatives for the procedure were discussed and the patient comprehended these risks. Once an appropriate time out was taken, the patient had the defibrillator pads placed in the anterior and posterior position. The patient then underwent sedation by the anesthesia service. Once an appropriate level of sedation was achieved, the patient received a single biphasic, synchronized 200J shock with prompt conversion to sinus rhythm. No apparent complications. Rhythm confirmed by ICD interrogation at bedside.  Arvilla Meres, MD  8:37 AM

## 2023-05-19 NOTE — Interval H&P Note (Signed)
 History and Physical Interval Note:  05/19/2023 8:07 AM  Austin [name]  has presented today for surgery, with the diagnosis of AFIB.  The various methods of treatment have been discussed with the patient and family. After consideration of risks, benefits and other options for treatment, the patient has consented to  Procedure(s): CARDIOVERSION (N/A) as a surgical intervention.  The patient's history has been reviewed, patient examined, no change in status, stable for surgery.  I have reviewed the patient's chart and labs.  Questions were answered to the patient's satisfaction.     Adina Puzzo

## 2023-05-22 NOTE — Anesthesia Postprocedure Evaluation (Signed)
 Anesthesia Post Note  Patient: Austin [name]  Procedure(s) Performed: CARDIOVERSION     Patient location during evaluation: Cath Lab Anesthesia Type: General Level of consciousness: awake and alert Pain management: pain level controlled Vital Signs Assessment: post-procedure vital signs reviewed and stable Respiratory status: spontaneous breathing, nonlabored ventilation, respiratory function stable and patient connected to nasal cannula oxygen Cardiovascular status: blood pressure returned to baseline and stable Postop Assessment: no apparent nausea or vomiting Anesthetic complications: no  There were no known notable events for this encounter.  Last Vitals:  Vitals:   05/19/23 0840 05/19/23 0850  BP: 96/66 101/69  Pulse: 60 61  Resp: 13 13  Temp:    SpO2: 98% 97%    Last Pain:  Vitals:   05/19/23 0832  TempSrc:   PainSc: 0-No pain                 Winda Summerall L Willamae Demby

## 2023-06-30 ENCOUNTER — Telehealth (HOSPITAL_COMMUNITY): Payer: Self-pay | Admitting: *Deleted

## 2023-06-30 NOTE — Telephone Encounter (Signed)
 Called to confirm/remind patient of their appointment at the [facility] on 07/03/23.       Appointment:              [x] Confirmed             [] Left mess              [] No answer/No voice mail             [] Phone not in service   Patient reminded to bring all medications and/or complete list.   Confirmed patient has transportation. Gave directions, instructed to utilize valet parking.

## 2023-06-30 NOTE — Progress Notes (Signed)
 ADVANCED HF CLINIC NOTE   Primary Care: Madonna Schlein, NP HF Cardiologist: Dr. Julane Ny  HPI: Austin [name] is a 68 y.o. old with a history of chronic HFrEF, HTN, HLD, nonobstructive CAD 2012, CKD Stage III, PAF, and single chamber Biotronik .    EF initially down 2012 to 20%. EF improved in 2013 to 50-55%.    Cath (2015): LCX 20% OMI, otherwise ok.  Echo (2022): EF 25-30% RV normal.  Echo (03/2022): EF 15-20% RV normal.    Admitted to [facility] Jan, Feb, March, April, and May of 2024 with A/C HFrEF. Noted to be in SR in March 2024.   He decided he wanted to come back to [facility] to re-establish with Dr Julane Ny and Heart Failure Team. Had been having trouble paying for medications.    Admitted 6/24 with a/c HF and AF. Echo showed EF < 20% RV normal. Diuresed with IV lasix , given IV iron  and started on IV amiodarone . R/LHC showed minimal CAD, severe NICM EF 10%, mildly elevated filling pressures and severely reduced CI 1.9. AF was rate controlled, with frequent PVCs. Chemically converted to NSR on amio. LVAD work up initiated. Drips weaned and GDMT titrated. He was discharged home, weight 204 lbs. Plan to repeat echo in 4-6 weeks after GDMT and PVC suppression, with eye toward LVAD if EF not improved.  Post hospital follow up, found to be back in AF, rate controlled.  NYHA I-II, weight up 12 lbs but volume stable with REDs 31%. He was on amiodarone  200 bid, had missed some Eliquis  doses so arranged for TEE/DCCV.   S/p TEE 08/26/22 LVEF < 20%, global HK, RV moderate HK, mild to moderate MR with posterior leaflet retracted, moderate TR, no LAA thrombus  Was admitted in 11/24 with cardiogenic shock. Echo 11/24 EF < 20%. Treated with milrinone .  EF Device interrogation showed that he developed AV block in 10/24 and RV pacing went up > 70%. The long AV delay causing frequent paced beats and dropped beats as well. Underwent upgrade to CRT device with RA lead as well   Seen in AF clinic   in 2/25. Back in AF. DC-CV arranged but when he arrived he had accidentally stopped Eliquis  and was very confused about the rest of his medications. DC-CV cancelled. I discussed with his cardiologist at the [facility] (Dr. Marionette Sick) who arranged to see him back and work with him on his meds. Saw Dr. Rodolfo Clan (Ep) on 04/18/23. Was still in AF but rates controlled. Was having dark stools and pending GI eval.   Echo 3/25 EF 20-25% RV ok . Back in rate controlled AF 3/25, underwent DCCV to NSR 05/19/23.  Today he returns for post DC-CV HF follow up. Overall feeling fine. He is SOB walking further distances uphill, other wise does OK. Denies palpitations, abnormal bleeding, CP, dizziness, edema, or PND/Orthopnea. Appetite ok. Weight at home  210-212 pounds. Taking all medications. He walks for exercise and does stretch bands. Fell 3 weeks ago. Has been on Entresto  49/51, no losartan  in his med bag. Was going to [place] and see Dr. Marionette Sick.   Cardiac Studies - Echo 3/25 EF 20-25% RV ok   - TEE (7/24): LVEF < 20%, global HK, RV moderate HK, mild to moderate MR with posterior leaflet retracted, moderate TR, no LAA thrombus - R/LHC (6/24): minimal CAD, severe NICM EF 10% RA 9, PA 34/19 ( 27), PCWP 20 (v waves to 30), CO/CI (thermo) 4.3/1.9, PVR 1.6 - Echo (6/24): EF <  20%, echodensity on device in RV, RV moderately down, mild MR, ascending aorta 40 mm - Echo (2/24): EF 15-20% RV normal.  - Echo (2022): EF 25-30% RV normal.  - Cath (2015): LCX 20% OMI, otherwise ok.  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   Single Chamber Biotronik   Myocardial infarction [facility]) 2012   12/31/2013   Nonischemic cardiomyopathy (HCC) 11/15/2010   cath 12/10/10 minimal nonobstructive coronary artery disease   Olecranon bursitis    Squamous cell carcinoma of arm, 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     apixaban  (ELIQUIS ) 5 MG TABS tablet Take 1 tablet (5 mg total) by mouth 2 (two) times daily. Start taking on 01/17/23 pm     digoxin  (LANOXIN ) 0.125 MG tablet Take 1 tablet (0.125 mg total) by mouth daily. 90 tablet 3   empagliflozin  (JARDIANCE ) 25 MG TABS tablet Take 12.5 mg by mouth daily.     fenofibrate (TRICOR) 48 MG tablet Take 2 tablets by mouth Daily     melatonin 3 MG TABS tablet Take 6 mg by mouth at bedtime as needed (sleep).     mexiletine (MEXITIL ) 200 MG capsule Take 1 capsule (200 mg total) by mouth every 12 (twelve) hours. 60 capsule 3   polyethylene glycol (MIRALAX  / GLYCOLAX ) 17 g packet Take 17 g by mouth daily. (Patient taking differently: Take 17 g by mouth daily as needed (constipation.).)     spironolactone  (ALDACTONE ) 25 MG tablet Take 0.5 tablets (12.5 mg total) by mouth in the morning. 90 tablet 3   torsemide  (DEMADEX ) 20 MG tablet Take 1 tablet (20 mg total) by mouth daily. 30 tablet 3   ferrous sulfate  325 (65 FE) MG tablet Take 1 tablet (325 mg total) by mouth every Monday, Wednesday, and Friday. In the morning (Patient not taking: Reported on 07/03/2023) 30 tablet 11   losartan  (COZAAR ) 100 MG tablet Take 100 mg by mouth in the morning. (Patient not taking: Reported on 07/03/2023)     omeprazole  (PRILOSEC) 20 MG capsule Take  1 capsule (20 mg total) by mouth daily. (Patient not taking: Reported on 07/03/2023) 90 capsule 3   No current facility-administered medications for this encounter.   Allergies  Allergen Reactions   Lisinopril  Cough   Social History   Socioeconomic History   Marital status: Single    Spouse name: Not on file   Number of children: Not on file   Years of education: Not on file   Highest education level: Not on file  Occupational History   Not on file  Tobacco Use   Smoking status: Former    Current packs/day: 0.00  Average packs/day: 2.0 packs/day for 14.0 years (28.0 ttl pk-yrs)    Types: Cigarettes    Start date: 02/15/1979    Quit date: 02/14/1993    Years since quitting: 30.4   Smokeless tobacco: Not on file   Tobacco comments:    Former smoker 03/20/23  Substance and Sexual Activity   Alcohol use: Yes    Alcohol/week: 3.0 standard drinks of alcohol    Types: 1 Cans of beer, 2 Shots of liquor per week    Comment: once a month a beer or couple shots of bourbon 03/20/23   Drug use: Not Currently    Types: Marijuana    Comment: occ smokes marjuana 03/20/23   Sexual activity: Not on file  Other Topics Concern   Not on file  Social History Narrative   He is single and lives in [place].  He drives a refrigerator truck.   Social Drivers of Health   Financial Resource Strain: Patient Declined (03/02/2023)   Received from [facility]   Overall Financial Resource Strain (CARDIA)    Difficulty of Paying Living Expenses: Patient declined  Food Insecurity: Patient Declined (03/02/2023)   Received from [facility]   Hunger Vital Sign    Worried About Running Out of Food in the Last Year: Patient declined    Ran Out of Food in the Last Year: Patient declined  Transportation Needs: Patient Declined (03/02/2023)   Received from [facility] - Transportation    Lack of Transportation (Medical): Patient declined    Lack of Transportation (Non-Medical): Patient  declined  Physical Activity: Sufficiently Active (11/01/2022)   Received from [facility]   Exercise Vital Sign    Days of Exercise per Week: 3 days    Minutes of Exercise per Session: 150+ min  Stress: No Stress Concern Present (11/01/2022)   Received from [facility] Occupational [facility] - Occupational Stress Questionnaire    Feeling of Stress : Not at all  Social Connections: Socially Integrated (11/01/2022)   Received from [facility]   Social Network    How would you rate your social network (family, work, friends)?: Good participation with social networks  Intimate Partner Violence: Not At Risk (01/05/2023)   Humiliation, Afraid, Rape, and Kick questionnaire    Fear of Current or Ex-Partner: No    Emotionally Abused: No    Physically Abused: No    Sexually Abused: No   Family History  Problem Relation Age of Onset   Breast cancer Mother    Cancer Father    Heart attack Brother    Heart attack Brother 42   BP 90/60   Pulse (!) 52   Wt 96.1 kg (211 lb 12.8 oz)   SpO2 99%   BMI 25.78 kg/m   Wt Readings from Last 3 Encounters:  07/03/23 96.1 kg (211 lb 12.8 oz)  05/08/23 92.9 kg (204 lb 12.8 oz)  04/18/23 95 kg (209 lb 6.4 oz)   PHYSICAL EXAM: General:  NAD. No resp difficulty, walked into clinic HEENT: Normal Neck: Supple. No JVD. Cor: Regular rate & rhythm. No rubs, gallops or murmurs. Lungs: Clear Abdomen: Soft, nontender, nondistended.  Extremities: No cyanosis, clubbing, rash, edema Neuro: Alert & oriented x 3, moves all 4 extremities w/o difficulty. Affect pleasant.  ECG (personally reviewed): A sensed V paced with PVCs  ASSESSMENT & PLAN: 1. Chronic Systolic Heart Failure -> cardiogenic shock - NICM  - Initially diagnosed with HFrEF in 2012. EF  previously improved to 50-55% on medications.  - Echo (6/24): EF now < 20%. He does have single chamber Biotronik.  - R/LHC (7/24) w/ minimal CAD, mildly elevated filling pressures and  severely reduced CO/CI by thermo 4.3/1.9.  - Echo 11/24 EF < 20% -> shock - ICD interrogated 11/24 ->he developed AV block in 10/24 and RV pacing went up > 70% reculting in SR with long AV delay causing frequent paced beats and dropped beats as well.  - CRT upgrade 11/24 with a placement of an atrial lead  - Echo 05/08/23 LV markedly dilated EF 20-25% RV ok  - Doing well NYHA II despite persistent severe LV dysfunction, volume OK - BP low, stop Entresto  - Start losartan  12.5 mg daily. (Looks like he was taking losartan  + Entresto  at [facility] appt, and they stopped losartan ) - Continue torsemide  20 mg daily  - Continue Jardiance  12.5 mg daily. - Continue digoxin  0.125 mg daily. Needs a digoxin  trough next visit. - Continue spironolactone  12.5 mg daily  - Off b-blocker with low output - will not rechallenge today  - Labs today  2. Paroxysmal A fib  - Back in AF. Rate controlled - DC-CV in 2/25 deferred due to accidentally stopping Eliquis   - s/p DCCV to NSR 4/25 - A sensed V paced on ECG today - Continue Eliquis  5 mg bid   3. CKD Stage IIIa - Baseline SCr 1.2-1.4.  - Continue SGLT2i - Labs today   4. ?RV Lead vegetation - possible that this is due to the "curl-i-cue" that the lead forms in the right heart, as noted on CXR.  - No vegetation on TEE 7/24   5. PVCs - High PVC burden. ~ 50% during prior admission.  - Continue mexilitene 200 bid.  - Follows with EP  6. Empyhsema - Noted on CT chest  - No change  Follow up in 2-3 weeks with PharmD for med check (will need a digoxin  trough) and 3 months with Dr. Bensimhon. I asked him to bring all of his medications to his follow up  Elmarie Hacking, FNP  3:42 PM

## 2023-07-03 ENCOUNTER — Encounter (HOSPITAL_COMMUNITY): Payer: Self-pay

## 2023-07-03 ENCOUNTER — Ambulatory Visit (HOSPITAL_COMMUNITY)
Admission: RE | Admit: 2023-07-03 | Discharge: 2023-07-03 | Disposition: A | Discharge status: Home / Self Care | Source: Ambulatory Visit | Attending: Family Medicine | Admitting: Family Medicine

## 2023-07-03 VITALS — BP 90/60 | HR 52 | Wt 211.8 lb

## 2023-07-03 DIAGNOSIS — I4819 Other persistent atrial fibrillation: Secondary | ICD-10-CM

## 2023-07-03 DIAGNOSIS — Z79899 Other long term (current) drug therapy: Secondary | ICD-10-CM | POA: Insufficient documentation

## 2023-07-03 DIAGNOSIS — Z87891 Personal history of nicotine dependence: Secondary | ICD-10-CM | POA: Insufficient documentation

## 2023-07-03 DIAGNOSIS — Z7984 Long term (current) use of oral hypoglycemic drugs: Secondary | ICD-10-CM | POA: Insufficient documentation

## 2023-07-03 DIAGNOSIS — I5022 Chronic systolic (congestive) heart failure: Secondary | ICD-10-CM | POA: Insufficient documentation

## 2023-07-03 DIAGNOSIS — I2510 Atherosclerotic heart disease of native coronary artery without angina pectoris: Secondary | ICD-10-CM | POA: Insufficient documentation

## 2023-07-03 DIAGNOSIS — I130 Hypertensive heart and chronic kidney disease with heart failure and stage 1 through stage 4 chronic kidney disease, or unspecified chronic kidney disease: Secondary | ICD-10-CM | POA: Diagnosis present

## 2023-07-03 DIAGNOSIS — I428 Other cardiomyopathies: Secondary | ICD-10-CM | POA: Diagnosis not present

## 2023-07-03 DIAGNOSIS — I493 Ventricular premature depolarization: Secondary | ICD-10-CM | POA: Insufficient documentation

## 2023-07-03 DIAGNOSIS — Z7901 Long term (current) use of anticoagulants: Secondary | ICD-10-CM | POA: Insufficient documentation

## 2023-07-03 DIAGNOSIS — Z95810 Presence of automatic (implantable) cardiac defibrillator: Secondary | ICD-10-CM | POA: Insufficient documentation

## 2023-07-03 DIAGNOSIS — I480 Paroxysmal atrial fibrillation: Secondary | ICD-10-CM | POA: Insufficient documentation

## 2023-07-03 DIAGNOSIS — N1831 Chronic kidney disease, stage 3a: Secondary | ICD-10-CM | POA: Diagnosis not present

## 2023-07-03 LAB — BASIC METABOLIC PANEL WITH GFR
Anion gap: 9 (ref 5–15)
BUN: 21 mg/dL (ref 8–23)
CO2: 24 mmol/L (ref 22–32)
Calcium: 9.2 mg/dL (ref 8.9–10.3)
Chloride: 106 mmol/L (ref 98–111)
Creatinine, Ser: 1.91 mg/dL — ABNORMAL HIGH (ref 0.61–1.24)
GFR, Estimated: 37 mL/min — ABNORMAL LOW (ref >60)
Glucose, Bld: 79 mg/dL (ref 70–99)
Potassium: 4.1 mmol/L (ref 3.5–5.1)
Sodium: 139 mmol/L (ref 135–145)

## 2023-07-03 LAB — CBC
HCT: 36.9 % — ABNORMAL LOW (ref 39.0–52.0)
Hemoglobin: 10.5 g/dL — ABNORMAL LOW (ref 13.0–17.0)
MCH: 21 pg — ABNORMAL LOW (ref 26.0–34.0)
MCHC: 28.5 g/dL — ABNORMAL LOW (ref 30.0–36.0)
MCV: 73.7 fL — ABNORMAL LOW (ref 80.0–100.0)
Platelets: 292 10*3/uL (ref 150–400)
RBC: 5.01 MIL/uL (ref 4.22–5.81)
RDW: 20.6 % — ABNORMAL HIGH (ref 11.5–15.5)
WBC: 7.2 10*3/uL (ref 4.0–10.5)
nRBC: 0 % (ref 0.0–0.2)

## 2023-07-03 LAB — BRAIN NATRIURETIC PEPTIDE: B Natriuretic Peptide: 356 pg/mL — ABNORMAL HIGH (ref 0.0–100.0)

## 2023-07-03 MED ORDER — LOSARTAN POTASSIUM 25 MG PO TABS: 12.5000 mg | ORAL_TABLET | Freq: Every day | ORAL | 3 refills | Status: DC

## 2023-07-03 MED ORDER — LOSARTAN POTASSIUM 25 MG PO TABS: 12.5000 mg | ORAL_TABLET | Freq: Every day | ORAL | 0 refills | Status: DC

## 2023-07-03 NOTE — Patient Instructions (Addendum)
 Thank you for coming in today  If you had labs drawn today, any labs that are abnormal the clinic will call you No news is good news  Medications: Stop entresto   Start Losartan  12.5 mg 1/2 tablet daily  Follow up appointments:  Your physician recommends that you schedule a follow-up appointment in:  2-3 weeks with Pharmacy please bring all your medications  3 months With Dr. Julane Ny  Please call our office to schedule the follow-up appointment in June for August 2025.   Do the following things EVERYDAY: Weigh yourself in the morning before breakfast. Write it down and keep it in a log. Take your medicines as prescribed Eat low salt foods--Limit salt (sodium) to 2000 mg per day.  Stay as active as you can everyday Limit all fluids for the day to less than 2 liters   At the [facility], you and your health needs are our priority. As part of our continuing mission to provide you with exceptional heart care, we have created designated Provider Care Teams. These Care Teams include your primary Cardiologist (physician) and Advanced Practice Providers (APPs- Physician Assistants and Nurse Practitioners) who all work together to provide you with the care you need, when you need it.   You may see any of the following providers on your designated Care Team at your next follow up: Dr Jules Oar Dr Peder Bourdon Dr. Mimi Alt, NP Ruddy Corral, [place] [facility] [place], [place] Dennise Fitz, NP Luster Salters, PharmD   Please be sure to bring in all your medications bottles to every appointment.    Thank you for choosing [facility]  If you have any questions or concerns before your next appointment please send us  a message through [facility] or call our office at [phone].    TO LEAVE A MESSAGE FOR THE NURSE SELECT OPTION 2, PLEASE LEAVE A MESSAGE INCLUDING: YOUR NAME DATE OF BIRTH CALL BACK  NUMBER REASON FOR CALL**this is important as we prioritize the call backs  YOU WILL RECEIVE A CALL BACK THE SAME DAY AS LONG AS YOU CALL BEFORE 4:00 PM

## 2023-07-04 ENCOUNTER — Other Ambulatory Visit (HOSPITAL_COMMUNITY)

## 2023-07-04 ENCOUNTER — Ambulatory Visit (HOSPITAL_COMMUNITY): Payer: Self-pay | Admitting: Family Medicine

## 2023-07-11 ENCOUNTER — Emergency Department (HOSPITAL_COMMUNITY)

## 2023-07-11 ENCOUNTER — Encounter (HOSPITAL_COMMUNITY): Payer: Self-pay

## 2023-07-11 ENCOUNTER — Inpatient Hospital Stay (HOSPITAL_COMMUNITY)
Admission: EM | Admit: 2023-07-11 | Discharge: 2023-07-14 | DRG: 698 | Disposition: A | Discharge status: Home / Self Care | Attending: Internal Medicine | Admitting: Internal Medicine

## 2023-07-11 DIAGNOSIS — I428 Other cardiomyopathies: Secondary | ICD-10-CM | POA: Diagnosis present

## 2023-07-11 DIAGNOSIS — I493 Ventricular premature depolarization: Secondary | ICD-10-CM | POA: Diagnosis present

## 2023-07-11 DIAGNOSIS — R0902 Hypoxemia: Secondary | ICD-10-CM

## 2023-07-11 DIAGNOSIS — J9601 Acute respiratory failure with hypoxia: Secondary | ICD-10-CM | POA: Diagnosis present

## 2023-07-11 DIAGNOSIS — Z95810 Presence of automatic (implantable) cardiac defibrillator: Secondary | ICD-10-CM

## 2023-07-11 DIAGNOSIS — R338 Other retention of urine: Secondary | ICD-10-CM | POA: Diagnosis present

## 2023-07-11 DIAGNOSIS — I4891 Unspecified atrial fibrillation: Secondary | ICD-10-CM | POA: Diagnosis present

## 2023-07-11 DIAGNOSIS — I5023 Acute on chronic systolic (congestive) heart failure: Secondary | ICD-10-CM | POA: Diagnosis present

## 2023-07-11 DIAGNOSIS — F1290 Cannabis use, unspecified, uncomplicated: Secondary | ICD-10-CM | POA: Diagnosis present

## 2023-07-11 DIAGNOSIS — J189 Pneumonia, unspecified organism: Secondary | ICD-10-CM | POA: Diagnosis present

## 2023-07-11 DIAGNOSIS — E876 Hypokalemia: Secondary | ICD-10-CM | POA: Diagnosis present

## 2023-07-11 DIAGNOSIS — N138 Other obstructive and reflux uropathy: Secondary | ICD-10-CM | POA: Diagnosis present

## 2023-07-11 DIAGNOSIS — R3914 Feeling of incomplete bladder emptying: Secondary | ICD-10-CM

## 2023-07-11 DIAGNOSIS — N320 Bladder-neck obstruction: Principal | ICD-10-CM | POA: Diagnosis present

## 2023-07-11 DIAGNOSIS — Z87891 Personal history of nicotine dependence: Secondary | ICD-10-CM

## 2023-07-11 DIAGNOSIS — Z7984 Long term (current) use of oral hypoglycemic drugs: Secondary | ICD-10-CM

## 2023-07-11 DIAGNOSIS — J449 Chronic obstructive pulmonary disease, unspecified: Secondary | ICD-10-CM | POA: Diagnosis present

## 2023-07-11 DIAGNOSIS — Z9079 Acquired absence of other genital organ(s): Secondary | ICD-10-CM

## 2023-07-11 DIAGNOSIS — I2510 Atherosclerotic heart disease of native coronary artery without angina pectoris: Secondary | ICD-10-CM | POA: Diagnosis present

## 2023-07-11 DIAGNOSIS — D649 Anemia, unspecified: Secondary | ICD-10-CM | POA: Diagnosis present

## 2023-07-11 DIAGNOSIS — J440 Chronic obstructive pulmonary disease with acute lower respiratory infection: Secondary | ICD-10-CM | POA: Diagnosis present

## 2023-07-11 DIAGNOSIS — I480 Paroxysmal atrial fibrillation: Secondary | ICD-10-CM | POA: Diagnosis present

## 2023-07-11 DIAGNOSIS — R7989 Other specified abnormal findings of blood chemistry: Secondary | ICD-10-CM | POA: Diagnosis not present

## 2023-07-11 DIAGNOSIS — D509 Iron deficiency anemia, unspecified: Secondary | ICD-10-CM | POA: Diagnosis present

## 2023-07-11 DIAGNOSIS — Z8249 Family history of ischemic heart disease and other diseases of the circulatory system: Secondary | ICD-10-CM

## 2023-07-11 DIAGNOSIS — J439 Emphysema, unspecified: Secondary | ICD-10-CM | POA: Diagnosis present

## 2023-07-11 DIAGNOSIS — N400 Enlarged prostate without lower urinary tract symptoms: Secondary | ICD-10-CM | POA: Diagnosis present

## 2023-07-11 DIAGNOSIS — I130 Hypertensive heart and chronic kidney disease with heart failure and stage 1 through stage 4 chronic kidney disease, or unspecified chronic kidney disease: Secondary | ICD-10-CM | POA: Diagnosis present

## 2023-07-11 DIAGNOSIS — Z803 Family history of malignant neoplasm of breast: Secondary | ICD-10-CM

## 2023-07-11 DIAGNOSIS — J42 Unspecified chronic bronchitis: Secondary | ICD-10-CM

## 2023-07-11 DIAGNOSIS — Z7901 Long term (current) use of anticoagulants: Secondary | ICD-10-CM

## 2023-07-11 DIAGNOSIS — I509 Heart failure, unspecified: Principal | ICD-10-CM

## 2023-07-11 DIAGNOSIS — N1832 Chronic kidney disease, stage 3b: Secondary | ICD-10-CM | POA: Diagnosis present

## 2023-07-11 DIAGNOSIS — Z888 Allergy status to other drugs, medicaments and biological substances status: Secondary | ICD-10-CM

## 2023-07-11 DIAGNOSIS — Z9181 History of falling: Secondary | ICD-10-CM

## 2023-07-11 DIAGNOSIS — Z79899 Other long term (current) drug therapy: Secondary | ICD-10-CM

## 2023-07-11 DIAGNOSIS — E785 Hyperlipidemia, unspecified: Secondary | ICD-10-CM | POA: Diagnosis present

## 2023-07-11 DIAGNOSIS — J159 Unspecified bacterial pneumonia: Secondary | ICD-10-CM | POA: Diagnosis present

## 2023-07-11 DIAGNOSIS — R9431 Abnormal electrocardiogram [ECG] [EKG]: Secondary | ICD-10-CM | POA: Diagnosis present

## 2023-07-11 DIAGNOSIS — I252 Old myocardial infarction: Secondary | ICD-10-CM

## 2023-07-11 DIAGNOSIS — D631 Anemia in chronic kidney disease: Secondary | ICD-10-CM | POA: Diagnosis present

## 2023-07-11 DIAGNOSIS — D72829 Elevated white blood cell count, unspecified: Secondary | ICD-10-CM | POA: Diagnosis present

## 2023-07-11 DIAGNOSIS — N401 Enlarged prostate with lower urinary tract symptoms: Secondary | ICD-10-CM | POA: Diagnosis present

## 2023-07-11 DIAGNOSIS — E8721 Acute metabolic acidosis: Secondary | ICD-10-CM | POA: Diagnosis present

## 2023-07-11 DIAGNOSIS — I10 Essential (primary) hypertension: Secondary | ICD-10-CM | POA: Diagnosis present

## 2023-07-11 DIAGNOSIS — I4819 Other persistent atrial fibrillation: Secondary | ICD-10-CM

## 2023-07-11 DIAGNOSIS — R197 Diarrhea, unspecified: Secondary | ICD-10-CM | POA: Diagnosis present

## 2023-07-11 LAB — COMPREHENSIVE METABOLIC PANEL WITH GFR
ALT: 14 U/L (ref 0–44)
AST: 25 U/L (ref 15–41)
Albumin: 3.8 g/dL (ref 3.5–5.0)
Alkaline Phosphatase: 38 U/L (ref 38–126)
Anion gap: 12 (ref 5–15)
BUN: 18 mg/dL (ref 8–23)
CO2: 19 mmol/L — ABNORMAL LOW (ref 22–32)
Calcium: 9.1 mg/dL (ref 8.9–10.3)
Chloride: 109 mmol/L (ref 98–111)
Creatinine, Ser: 1.4 mg/dL — ABNORMAL HIGH (ref 0.61–1.24)
GFR, Estimated: 54 mL/min — ABNORMAL LOW (ref >60)
Glucose, Bld: 123 mg/dL — ABNORMAL HIGH (ref 70–99)
Potassium: 3.5 mmol/L (ref 3.5–5.1)
Sodium: 140 mmol/L (ref 135–145)
Total Bilirubin: 2 mg/dL — ABNORMAL HIGH (ref 0.0–1.2)
Total Protein: 6.6 g/dL (ref 6.5–8.1)

## 2023-07-11 LAB — PROTIME-INR
INR: 1.3 — ABNORMAL HIGH (ref 0.8–1.2)
Prothrombin Time: 16.3 s — ABNORMAL HIGH (ref 11.4–15.2)

## 2023-07-11 LAB — CBC WITH DIFFERENTIAL/PLATELET
Basophils Absolute: 0 10*3/uL (ref 0.0–0.1)
Basophils Relative: 0 %
Eosinophils Absolute: 0 10*3/uL (ref 0.0–0.5)
Eosinophils Relative: 0 %
HCT: 32.8 % — ABNORMAL LOW (ref 39.0–52.0)
Hemoglobin: 9.4 g/dL — ABNORMAL LOW (ref 13.0–17.0)
Lymphocytes Relative: 6 %
Lymphs Abs: 0.8 10*3/uL (ref 0.7–4.0)
MCH: 21.3 pg — ABNORMAL LOW (ref 26.0–34.0)
MCHC: 28.7 g/dL — ABNORMAL LOW (ref 30.0–36.0)
MCV: 74.4 fL — ABNORMAL LOW (ref 80.0–100.0)
Monocytes Absolute: 1.1 10*3/uL — ABNORMAL HIGH (ref 0.1–1.0)
Monocytes Relative: 8 %
Neutro Abs: 11.9 10*3/uL — ABNORMAL HIGH (ref 1.7–7.7)
Neutrophils Relative %: 85 %
Platelets: 230 10*3/uL (ref 150–400)
RBC: 4.41 MIL/uL (ref 4.22–5.81)
RDW: 21.2 % — ABNORMAL HIGH (ref 11.5–15.5)
Smear Review: NORMAL
WBC: 13.9 10*3/uL — ABNORMAL HIGH (ref 4.0–10.5)
nRBC: 0 % (ref 0.0–0.2)
nRBC: 0 /100{WBCs}

## 2023-07-11 LAB — BRAIN NATRIURETIC PEPTIDE: B Natriuretic Peptide: 3104.9 pg/mL — ABNORMAL HIGH (ref 0.0–100.0)

## 2023-07-11 LAB — TROPONIN I (HIGH SENSITIVITY): Troponin I (High Sensitivity): 70 ng/L — ABNORMAL HIGH (ref <18)

## 2023-07-11 LAB — LIPASE, BLOOD: Lipase: 26 U/L (ref 11–51)

## 2023-07-11 LAB — MAGNESIUM: Magnesium: 2.1 mg/dL (ref 1.7–2.4)

## 2023-07-11 MED ORDER — ONDANSETRON 4 MG PO TBDP
4.0000 mg | ORAL_TABLET | Freq: Once | ORAL | Status: AC
  Administered 2023-07-11: 4 mg via ORAL
  Filled 2023-07-11: qty 1

## 2023-07-11 MED ORDER — FUROSEMIDE 10 MG/ML IJ SOLN
40.0000 mg | Freq: Once | INTRAMUSCULAR | Status: AC
  Administered 2023-07-11: 40 mg via INTRAVENOUS
  Filled 2023-07-11: qty 4

## 2023-07-11 MED ORDER — IOHEXOL 350 MG/ML SOLN
100.0000 mL | Freq: Once | INTRAVENOUS | Status: AC | PRN
Start: 2023-07-11 — End: 2023-07-11
  Administered 2023-07-11: 100 mL via INTRAVENOUS

## 2023-07-11 MED ORDER — OXYCODONE-ACETAMINOPHEN 5-325 MG PO TABS
1.0000 | ORAL_TABLET | Freq: Once | ORAL | Status: AC
  Administered 2023-07-11: 1 via ORAL
  Filled 2023-07-11: qty 1

## 2023-07-11 MED ORDER — IPRATROPIUM-ALBUTEROL 0.5-2.5 (3) MG/3ML IN SOLN
3.0000 mL | Freq: Once | RESPIRATORY_TRACT | Status: AC
  Administered 2023-07-11: 3 mL via RESPIRATORY_TRACT
  Filled 2023-07-11: qty 3

## 2023-07-11 NOTE — ED Triage Notes (Signed)
 Patient c/o [facility] and lower abd pain x 3 days, gradually getting worse. Patient is tachypnic and pale in triage, 92% on RA RR 28, improved to 100% and RR 20 on 2L. Extensive cardiac hx, no leg swelling reported.

## 2023-07-11 NOTE — H&P (Incomplete)
 Austin [name] GLO:[id number] DOB: August 04, 1955 DOA: 07/11/2023     PCP: Madonna Schlein, NP   Outpatient Specialists:   CARDS:  Elmarie Hacking, FNP Cardiology    Patient arrived to ER on 07/11/23 at 2111 Referred by Attending Rolinda Climes, DO   Patient coming from:    home Lives alone,   *** With family From facility *** SNF, [place], [place]    Chief Complaint:   Chief Complaint  Patient presents with  . Abdominal Pain  . Shortness of Breath    HPI: Austin [name] is a 68 y.o. male with medical history significant of   systolic CHF EF 20%, a.fib on Eliquis , CKA 3b, anemia, minimal CAD, cardiogenic shock requiring milrinone , BPH, COPD, HLD, ICD in place    Presented with  shortness of breath Here with SOB, increased work of breathing EF of less than 20% Stated could not sleep because he was so short of breath CT showed pleural effusion infiltrate vs edema And BPH Patient endorses urinary retention     Denies significant ETOH intake *** Does not smoke*** but interested in quitting***  Denies marijuana use ***    Regarding pertinent Chronic problems:     Hyperlipidemia -  on  tricor Lipid Panel     Component Value Date/Time   CHOL 156 08/11/2022 0001   TRIG 103 08/11/2022 0001   HDL 35 (L) 08/11/2022 0001   CHOLHDL 4.5 08/11/2022 0001   VLDL 21 08/11/2022 0001   LDLCALC 100 (H) 08/11/2022 0001     HTN on Torsemide , spironolactone  cozaar    chronic CHF  systolic  -   Torsemide , spironolactone  cozaar   last echo  Recent Results (from the past 18841 hours)  ECHOCARDIOGRAM COMPLETE   Collection Time: 05/08/23  1:32 PM  Result Value   S' Lateral 7.50   Single Plane A4C EF 5.9   Single Plane A2C EF 9.7   Calc EF 6.0   Area-P 1/2 4.15   AR max vel 2.96   AV Peak grad 5.4   Ao pk vel 1.16   Est EF < 20%   Narrative      ECHOCARDIOGRAM REPORT     IMPRESSIONS    1. Left ventricular ejection fraction, by estimation, is <20%. The left ventricle  has severely decreased function. The left ventricle demonstrates regional wall motion abnormalities (see scoring diagram/findings for description). The left ventricular  internal cavity size was severely dilated. Left ventricular diastolic function could not be evaluated.  2. Right ventricular systolic function is normal. The right ventricular size is normal. There is normal pulmonary artery systolic pressure.  3. Right atrial size was moderately dilated.  4. The mitral valve is normal in structure. Trivial mitral valve regurgitation. No evidence of mitral stenosis.  5. The aortic valve is tricuspid. Aortic valve regurgitation is not visualized. No aortic stenosis is present.  6. The inferior vena cava is normal in size with greater than 50% respiratory variability, suggesting right atrial pressure of 3 mmHg.           CAD  -  minimal followed by cardiology                           *** COPD - not **followed by pulmonology *** not  on baseline oxygen  *L,    *** OSA -on nocturnal oxygen, *CPAP, *noncompliant with CPAP     A. Fib -   atrial  fibrillation CHA2DS2 vas score   4       current  on anticoagulation with Eliquis ,           -  Rate control:  Currently controlled with  digoxin           CKD stage IIIb   baseline Cr 1.7 Estimated Creatinine Clearance: 61.1 mL/min (A) (by C-G formula based on SCr of 1.4 mg/dL (H)).  Lab Results  Component Value Date   CREATININE 1.40 (H) 07/11/2023   CREATININE 1.91 (H) 07/03/2023   CREATININE 1.70 (H) 05/08/2023   Lab Results  Component Value Date   NA 140 07/11/2023   CL 109 07/11/2023   K 3.5 07/11/2023   CO2 19 (L) 07/11/2023   BUN 18 07/11/2023   CREATININE 1.40 (H) 07/11/2023   GFRNONAA 54 (L) 07/11/2023   CALCIUM 9.1 07/11/2023   PHOS 3.9 01/12/2023   ALBUMIN 3.8 07/11/2023   GLUCOSE 123 (H) 07/11/2023    **** Liver disease MELD 3.0: 15 at 07/11/2023  9:38 PM MELD-Na: 15 at 07/11/2023  9:38 PM Calculated from: Serum  Creatinine: 1.4 mg/dL at 0/98/1191  4:78 PM Serum Sodium: 140 mmol/L (Using max of 137 mmol/L) at 07/11/2023  9:38 PM Total Bilirubin: 2 mg/dL at 2/95/6213  0:86 PM Serum Albumin: 3.8 g/dL (Using max of 3.5 g/dL) at 5/78/4696  2:95 PM INR(ratio): 1.3 at 07/11/2023  9:38 PM Age at listing (hypothetical): 49 years Sex: Male at 07/11/2023  9:38 PM  Hepatic Function Panel     Component Value Date/Time   PROT 6.6 07/11/2023 2138   ALBUMIN 3.8 07/11/2023 2138   AST 25 07/11/2023 2138   ALT 14 07/11/2023 2138   ALKPHOS 38 07/11/2023 2138   BILITOT 2.0 (H) 07/11/2023 2138   BILIDIR 0.6 (H) 08/07/2022 0912   IBILI 2.1 (H) 08/07/2022 0912   1.3  ***BPH - on Flomax , Proscar     *** Dementia - on Aricept** Nemenda  *** Chronic anemia - baseline hg Hemoglobin & Hematocrit  Recent Labs    05/08/23 1516 07/03/23 1614 07/11/23 2138  HGB 10.6* 10.5* 9.4*   Iron /TIBC/Ferritin/ %Sat    Component Value Date/Time   IRON  11 (L) 01/05/2023 1649   TIBC 438 01/05/2023 1649   FERRITIN 17 (L) 01/05/2023 1649   IRONPCTSAT 3 (L) 01/05/2023 1649     Seizure DO - las seizure *** currently on     Cancer:     While in ER:         Lab Orders         Comprehensive metabolic panel         Lipase, blood         CBC with Diff         Urinalysis, Routine w reflex microscopic -Urine, Clean Catch         Protime-INR         Magnesium         Brain natriuretic peptide         Digoxin  level      CT HEAD *** NON acute   MRI brain  ***no acute CVA  CXR - ***NON acute  CTabd/pelvis - ***nonacute  CTA chest - ***nonacute, no PE, * no evidence of infiltrate  Following Medications were ordered in ER: Medications  ipratropium-albuterol  (DUONEB) 0.5-2.5 (3) MG/3ML nebulizer solution 3 mL (3 mLs Nebulization Given 07/11/23 2237)  oxyCODONE -acetaminophen  (PERCOCET/ROXICET) 5-325 MG per tablet 1 tablet (1 tablet Oral Given 07/11/23 2339)  ondansetron  (ZOFRAN -ODT)  disintegrating tablet 4 mg (4  mg Oral Given 07/11/23 2339)  furosemide  (LASIX ) injection 40 mg (40 mg Intravenous Given 07/11/23 2339)  iohexol  (OMNIPAQUE ) 350 MG/ML injection 100 mL (100 mLs Intravenous Contrast Given 07/11/23 2301)    _______________________________________________________ ER Provider Called:       DrAaron Aas  They Recommend admit to medicine *** Will see in AM  ***SEEN in ER     ED Triage Vitals  Encounter Vitals Group     BP 07/11/23 2117 (!) 141/87     Systolic BP Percentile --      Diastolic BP Percentile --      Pulse Rate 07/11/23 2117 61     Resp 07/11/23 2117 18     Temp 07/11/23 2117 98 F (36.7 C)     Temp src --      SpO2 07/11/23 2117 92 %     Weight --      Height --      Head Circumference --      Peak Flow --      Pain Score 07/11/23 2339 8     Pain Loc --      Pain Education --      Exclude from Growth Chart --   AOZH(08)@     _________________________________________ Significant initial  Findings: Abnormal Labs Reviewed  COMPREHENSIVE METABOLIC PANEL WITH GFR - Abnormal; Notable for the following components:      Result Value   CO2 19 (*)    Glucose, Bld 123 (*)    Creatinine, Ser 1.40 (*)    Total Bilirubin 2.0 (*)    GFR, Estimated 54 (*)    All other components within normal limits  CBC WITH DIFFERENTIAL/PLATELET - Abnormal; Notable for the following components:   WBC 13.9 (*)    Hemoglobin 9.4 (*)    HCT 32.8 (*)    MCV 74.4 (*)    MCH 21.3 (*)    MCHC 28.7 (*)    RDW 21.2 (*)    Neutro Abs 11.9 (*)    Monocytes Absolute 1.1 (*)    All other components within normal limits  PROTIME-INR - Abnormal; Notable for the following components:   Prothrombin Time 16.3 (*)    INR 1.3 (*)    All other components within normal limits  BRAIN NATRIURETIC PEPTIDE - Abnormal; Notable for the following components:   B Natriuretic Peptide 3,104.9 (*)    All other components within normal limits  TROPONIN I (HIGH SENSITIVITY) - Abnormal; Notable for the following  components:   Troponin I (High Sensitivity) 70 (*)    All other components within normal limits      _________________________ Troponin ***ordered Cardiac Panel (last 3 results) Recent Labs    07/11/23 2138  TROPONINIHS 70*     ECG: Ordered Personally reviewed and interpreted by me showing: HR : *** Rhythm: *NSR, Sinus tachycardia * A.fib. W RVR, RBBB, LBBB, Paced Ischemic changes*nonspecific changes, no evidence of ischemic changes QTC*  BNP (last 3 results) Recent Labs    05/08/23 1516 07/03/23 1614 07/11/23 2138  BNP 292.5* 356.0* 3,104.9*      No results for input(s): "DDIMER", "FERRITIN", "LDH", "CRP" in the last 72 hours.    ____________________ This patient meets SIRS Criteria and may be septic. SIRS = Systemic Inflammatory Response Syndrome  Order a lactic acid level if needed AND/OR Initiate the sepsis protocol with the attached order set OR Click "Treating Associated Infection or Illness" if the patient is being  treated for an infection that is a known cause of these abnormalities     The recent clinical data is shown below. Vitals:   07/11/23 2117 07/11/23 2127 07/11/23 2238  BP: (!) 141/87    Pulse: 61    Resp: 18    Temp: 98 F (36.7 C)    SpO2: 92% 92% 100%        WBC     Component Value Date/Time   WBC 13.9 (H) 07/11/2023 2138   LYMPHSABS 0.8 07/11/2023 2138   MONOABS 1.1 (H) 07/11/2023 2138   EOSABS 0.0 07/11/2023 2138   BASOSABS 0.0 07/11/2023 2138        Lactic Acid, Venous    Component Value Date/Time   LATICACIDVEN 1.4 01/06/2023 1957     Lactic Acid, Venous    Component Value Date/Time   LATICACIDVEN 1.4 01/06/2023 1957    Procalcitonin *** Ordered      UA *** no evidence of UTI  ***Pending ***not ordered   Urine analysis:    Component Value Date/Time   COLORURINE YELLOW 01/05/2023 1357   APPEARANCEUR CLEAR 01/05/2023 1357   LABSPEC 1.016 01/05/2023 1357   PHURINE 6.0 01/05/2023 1357   GLUCOSEU NEGATIVE  01/05/2023 1357   HGBUR NEGATIVE 01/05/2023 1357   BILIRUBINUR NEGATIVE 01/05/2023 1357   KETONESUR NEGATIVE 01/05/2023 1357   PROTEINUR NEGATIVE 01/05/2023 1357   NITRITE NEGATIVE 01/05/2023 1357   LEUKOCYTESUR NEGATIVE 01/05/2023 1357    Results for orders placed or performed during the hospital encounter of 01/05/23  Resp panel by RT-PCR (RSV, Flu A&B, Covid) Anterior Nasal Swab     Status: None   Collection Time: 01/05/23 10:21 AM   Specimen: Anterior Nasal Swab  Result Value Ref Range Status   SARS Coronavirus 2 by RT PCR NEGATIVE NEGATIVE Final   Influenza A by PCR NEGATIVE NEGATIVE Final   Influenza B by PCR NEGATIVE NEGATIVE Final    Comment: (NOTE) The Xpert Xpress SARS-CoV-2/FLU/RSV plus assay is intended as an aid in the diagnosis of influenza from Nasopharyngeal swab specimens and should not be used as a sole basis for treatment. Nasal washings and aspirates are unacceptable for Xpert Xpress SARS-CoV-2/FLU/RSV testing.  Fact Sheet for Patients: BloggerCourse.com  Fact Sheet for Healthcare Providers: SeriousBroker.it  This test is not yet approved or cleared by the [place]  FDA and has been authorized for detection and/or diagnosis of SARS-CoV-2 by FDA under an Emergency Use Authorization (EUA). This EUA will remain in effect (meaning this test can be used) for the duration of the COVID-19 declaration under Section 564(b)(1) of the Act, 21 U.S.C. section 360bbb-3(b)(1), unless the authorization is terminated or revoked.     Resp Syncytial Virus by PCR NEGATIVE NEGATIVE Final    Comment: (NOTE) Fact Sheet for Patients: BloggerCourse.com  Fact Sheet for Healthcare Providers: SeriousBroker.it  This test is not yet approved or cleared by the [place]  FDA and has been authorized for detection and/or diagnosis of SARS-CoV-2 by FDA under an Emergency Use  Authorization (EUA). This EUA will remain in effect (meaning this test can be used) for the duration of the COVID-19 declaration under Section 564(b)(1) of the Act, 21 U.S.C. section 360bbb-3(b)(1), unless the authorization is terminated or revoked.  Performed at [facility], [address], [place], [place] [address]   MRSA Next Gen by PCR, Nasal     Status: None   Collection Time: 01/06/23  5:58 PM   Specimen: Nasal Mucosa; Nasal Swab  Result Value  Ref Range Status   MRSA by PCR Next Gen NOT DETECTED NOT DETECTED Final    Comment: (NOTE) The GeneXpert MRSA Assay (FDA approved for NASAL specimens only), is one component of a comprehensive MRSA colonization surveillance program. It is not intended to diagnose MRSA infection nor to guide or monitor treatment for MRSA infections. Test performance is not FDA approved in patients less than 79 years old. Performed at [facility], [address], [place], [place] [address]   Surgical pcr screen     Status: None   Collection Time: 01/08/23  2:00 PM   Specimen: Nasal Mucosa; Nasal Swab  Result Value Ref Range Status   MRSA, PCR NEGATIVE NEGATIVE Final   Staphylococcus aureus NEGATIVE NEGATIVE Final    Comment: (NOTE) The Xpert SA Assay (FDA approved for NASAL specimens in patients 86 years of age and older), is one component of a comprehensive surveillance program. It is not intended to diagnose infection nor to guide or monitor treatment. Performed at [facility] [place] [facility], [address], [place],  [address]     ABX started Antibiotics Given (last 72 hours)     None        No results found for the last 90 days.    ________________________________________________________________  Arterial ***Venous  Blood Gas result:  pH *** pCO2 ***; pO2 ***;     %O2 Sat ***.  ABG    Component Value Date/Time   PHART 7.444 08/10/2022 1421   PCO2ART 28.9 (L) 08/10/2022 1421   PO2ART 77 (L) 08/10/2022 1421   HCO3  22.7 08/10/2022 1447   TCO2 24 08/10/2022 1447   ACIDBASEDEF 2.0 08/10/2022 1447   O2SAT 69.2 01/10/2023 0437       __________________________________________________________ Recent Labs  Lab 07/11/23 2138  NA 140  K 3.5  CO2 19*  GLUCOSE 123*  BUN 18  CREATININE 1.40*  CALCIUM 9.1  MG 2.1    Cr    stable,   Lab Results  Component Value Date   CREATININE 1.40 (H) 07/11/2023   CREATININE 1.91 (H) 07/03/2023   CREATININE 1.70 (H) 05/08/2023    Recent Labs  Lab 07/11/23 2138  AST 25  ALT 14  ALKPHOS 38  BILITOT 2.0*  PROT 6.6  ALBUMIN 3.8   Lab Results  Component Value Date   CALCIUM 9.1 07/11/2023   PHOS 3.9 01/12/2023        Plt: Lab Results  Component Value Date   PLT 230 07/11/2023       Recent Labs  Lab 07/11/23 2138  WBC 13.9*  NEUTROABS 11.9*  HGB 9.4*  HCT 32.8*  MCV 74.4*  PLT 230    HG/HCT   stable,      Component Value Date/Time   HGB 9.4 (L) 07/11/2023 2138   HCT 32.8 (L) 07/11/2023 2138   MCV 74.4 (L) 07/11/2023 2138      Recent Labs  Lab 07/11/23 2138  LIPASE 26     _______________________________________________ Hospitalist was called for admission for   Acute on chronic congestive heart failure,   Bladder outlet obstruction   Elevated troponin      The following Work up has been ordered so far:  Orders Placed This Encounter  Procedures  . Critical Care  . DG Abd Acute W/Chest  . CT Angio Chest/Abd/Pel for Dissection W and/or Wo Contrast  . Comprehensive metabolic panel  . Lipase, blood  . CBC with Diff  . Urinalysis, Routine w reflex microscopic -  Urine, Clean Catch  . Protime-INR  . Magnesium  . Brain natriuretic peptide  . Digoxin  level  . Diet NPO time specified  . Initiate Carrier Fluid Protocol  . Bladder scan  . Insert foley catheter  . Consult for [facility] Admission  . Bipap  . EKG 12-Lead  . Insert peripheral IV     OTHER Significant initial  Findings:  labs showing:     DM   labs:  HbA1C: Recent Labs    08/11/22 0001  HGBA1C 6.2*       CBG (last 3)  No results for input(s): "GLUCAP" in the last 72 hours.        Cultures:    Component Value Date/Time   SDES BLOOD LEFT ANTECUBITAL 08/09/2022 1810   SDES BLOOD LEFT HAND 08/09/2022 1810   SPECREQUEST  08/09/2022 1810    BOTTLES DRAWN AEROBIC AND ANAEROBIC Blood Culture adequate volume   SPECREQUEST  08/09/2022 1810    BOTTLES DRAWN AEROBIC AND ANAEROBIC Blood Culture adequate volume   CULT  08/09/2022 1810    NO GROWTH 5 DAYS Performed at [facility], [address] [place], [place] [address]    CULT  08/09/2022 1810    NO GROWTH 5 DAYS Performed at [facility], [address] [place], [place] [address]    REPTSTATUS 08/14/2022 FINAL 08/09/2022 1810   REPTSTATUS 08/14/2022 FINAL 08/09/2022 1810     Radiological Exams on Admission: CT Angio Chest/Abd/Pel for Dissection W and/or Wo Contrast Result Date: 07/11/2023 CLINICAL DATA:  Shortness of breath lower abdominal pain tachypnea EXAM: CT ANGIOGRAPHY CHEST, ABDOMEN AND PELVIS TECHNIQUE: Non-contrast CT of the chest was initially obtained. Multidetector CT imaging through the chest, abdomen and pelvis was performed using the standard protocol during bolus administration of intravenous contrast. Multiplanar reconstructed images and MIPs were obtained and reviewed to evaluate the vascular anatomy. RADIATION DOSE REDUCTION: This exam was performed according to the departmental dose-optimization program which includes automated exposure control, adjustment of the mA and/or kV according to patient size and/or use of iterative reconstruction technique. CONTRAST:  OMNIPAQUE  IOHEXOL  350 MG/ML SOLN COMPARISON:  CT 01/05/2023 FINDINGS: CTA CHEST FINDINGS Cardiovascular: Non contrasted images of the chest demonstrate no acute intramural hematoma. Nonaneurysmal aorta. No dissection is seen. Left-sided multi lead cardiac pacing device. Mild  cardiomegaly. No pericardial effusion Mediastinum/Nodes: Patent trachea. No thyroid  mass. No suspicious lymph nodes. Esophagus within normal limits Lungs/Pleura: Small right greater than left pleural effusions. Mild emphysema. Mild ground-glass density within the bilateral right greater than left perihilar lungs. Musculoskeletal: Sternum appears intact. No acute osseous abnormality Review of the MIP images confirms the above findings. CTA ABDOMEN AND PELVIS FINDINGS VASCULAR Aorta: Normal caliber aorta without aneurysm, dissection, vasculitis or significant stenosis. Celiac: Patent without evidence of aneurysm, dissection, vasculitis or significant stenosis. SMA: Patent without evidence of aneurysm, dissection, vasculitis or significant stenosis. Renals: Both renal arteries are patent without evidence of aneurysm, dissection, vasculitis, fibromuscular dysplasia or significant stenosis. IMA: Patent without evidence of aneurysm, dissection, vasculitis or significant stenosis. Inflow: Patent without evidence of aneurysm, dissection, vasculitis or significant stenosis. Veins: Suboptimally assessed Review of the MIP images confirms the above findings. NON-VASCULAR Hepatobiliary: No focal liver abnormality is seen. No gallstones, gallbladder wall thickening, or biliary dilatation. Pancreas: Unremarkable. No pancreatic ductal dilatation or surrounding inflammatory changes. Spleen: Normal in size. Small hyperenhancing focus measuring 5 mm within the anterior spleen, unchanged. Adrenals/Urinary Tract: Adrenal glands are normal. Kidneys show no hydronephrosis. The  bladder is diffusely thick walled. Stomach/Bowel: Stomach is nonenlarged. No dilated small bowel. No acute bowel wall thickening. Diverticular disease of the sigmoid colon. Lymphatic: No suspicious lymph nodes Reproductive: Markedly enlarged prostate Other: Negative for pelvic effusion or free air. Small fat containing inguinal hernias. Small fat containing  periumbilical hernia. Musculoskeletal: No acute osseous abnormality. Review of the MIP images confirms the above findings. IMPRESSION: 1. Negative for acute aortic dissection or aneurysm. 2. Small right greater than left pleural effusions. Mild ground-glass density within the bilateral right greater than left perihilar lungs, either due to mild edema or infection. Mild cardiomegaly. 3. Markedly enlarged prostate. Diffuse bladder wall thickening likely due to chronic bladder outlet obstruction versus cystitis. 4. Diverticular disease of the sigmoid colon without acute inflammatory process. Electronically Signed   By: Esmeralda Hedge M.D.   On: 07/11/2023 23:19   DG Abd Acute W/Chest Result Date: 07/11/2023 CLINICAL DATA:  Shortness of breath and lower abdominal pain x3 days. EXAM: DG ABDOMEN ACUTE WITH 1 VIEW CHEST COMPARISON:  January 14, 2023 FINDINGS: Stable multilead AICD positioning is noted. There is no evidence of dilated bowel loops or free intraperitoneal air. No radiopaque calculi or other significant radiographic abnormality is seen. Heart size and mediastinal contours are within normal limits. There is no evidence of an acute infiltrate, pleural effusion or pneumothorax. IMPRESSION: Negative abdominal radiographs.  No acute cardiopulmonary disease. Electronically Signed   By: Virgle Grime M.D.   On: 07/11/2023 22:23   _______________________________________________________________________________________________________ Latest  Blood pressure (!) 141/87, pulse 61, temperature 98 F (36.7 C), resp. rate 18, SpO2 100%.   Vitals  labs and radiology finding personally reviewed  Review of Systems:    Pertinent positives include:  , fatigue, shortness of breath at rest. No dyspnea on exertion Constitutional:  No weight loss, night sweats, Fevers, chills weight loss  HEENT:  No headaches, Difficulty swallowing,Tooth/dental problems,Sore throat,  No sneezing, itching, ear ache, nasal  congestion, post nasal drip,  Cardio-vascular:  No chest pain, Orthopnea, PND, anasarca, dizziness, palpitations.no Bilateral lower extremity swelling  GI:  No heartburn, indigestion, abdominal pain, nausea, vomiting, diarrhea, change in bowel habits, loss of appetite, melena, blood in stool, hematemesis Resp:  no , No excess mucus, no productive cough, No non-productive cough, No coughing up of blood.No change in color of mucus.No wheezing. Skin:  no rash or lesions. No jaundice GU:  no dysuria, change in color of urine, no urgency or frequency. No straining to urinate.  No flank pain.  Musculoskeletal:  No joint pain or no joint swelling. No decreased range of motion. No back pain.  Psych:  No change in mood or affect. No depression or anxiety. No memory loss.  Neuro: no localizing neurological complaints, no tingling, no weakness, no double vision, no gait abnormality, no slurred speech, no confusion  All systems reviewed and apart from HOPI all are negative _______________________________________________________________________________________________ Past Medical History:   Past Medical History:  Diagnosis Date  . Aortic atherosclerosis (HCC)   . Atrial fibrillation (HCC)   . Benign prostatic hypertrophy   . CKD (chronic kidney disease), stage III (HCC)   . Coronary artery disease   . Emphysema lung (HCC)   . Former smoker   . HFrEF (heart failure with reduced ejection fraction) (HCC)   . HTN (hypertension)   . Hyperlipidemia   . 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 Myocardial infarction [facility]) 2012   12/31/2013  . Nonischemic cardiomyopathy (HCC) 11/15/2010  cath 12/10/10 minimal nonobstructive coronary artery disease  . Olecranon bursitis   . 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 Systolic heart failure 11/15/2010   echo 12/08/10 EF 20%, severe left  ventricular enlargement, mild right ventricular enlargement      Past Surgical History:  Procedure Laterality Date  . BIV UPGRADE N/A 01/13/2023   Procedure: BIV UPGRADE;  Surgeon: Verona Goodwill, MD;  [facility] INVASIVE CV LAB;  Service: Cardiovascular;  Laterality: N/A;  . CARDIOVERSION N/A 08/26/2022   Procedure: CARDIOVERSION;  Surgeon: Mardell Shade, MD;  Location: [facility] INVASIVE CV LAB;  Service: Cardiovascular;  Laterality: N/A;  . CARDIOVERSION N/A 05/19/2023   Procedure: CARDIOVERSION;  Surgeon: Mardell Shade, MD;  Location: [facility] INVASIVE CV LAB;  Service: Cardiovascular;  Laterality: N/A;  . HAND SURGERY  02/14/1977   left hand machine trauma  . Right elbow mass excisional biopsy Right 12/13/2022  . RIGHT/LEFT HEART CATH AND CORONARY ANGIOGRAPHY N/A 08/10/2022   Procedure: RIGHT/LEFT HEART CATH AND CORONARY ANGIOGRAPHY;  Surgeon: Mardell Shade, MD;  Location: [facility] INVASIVE CV LAB;  Service: Cardiovascular;  Laterality: N/A;  . TEE WITHOUT CARDIOVERSION N/A 08/26/2022   Procedure: TRANSESOPHAGEAL ECHOCARDIOGRAM;  Surgeon: Mardell Shade, MD;  Location: [facility] INVASIVE CV LAB;  Service: Cardiovascular;  Laterality: N/A;  . TRANSURETHRAL RESECTION OF PROSTATE N/A 09/09/2021  . UPPER EXTREMITY VENOGRAPHY N/A 01/10/2023   Procedure: UPPER EXTREMITY VENOGRAPHY;  Surgeon: Boyce Byes, MD;  Location: [facility] INVASIVE CV LAB;  Service: Cardiovascular;  Laterality: N/A;    Social History:  Ambulatory *** independently cane, walker  wheelchair bound, bed bound     reports that he quit smoking about 30 years ago. His smoking use included cigarettes. He started smoking about 44 years ago. He has a 28 pack-year smoking history. He does not have any smokeless tobacco history on file. He reports current alcohol use of about 3.0 standard drinks of alcohol per week. He reports that he does not currently use drugs after having used the following drugs: Marijuana.     Family  History:   Family History  Problem Relation Age of Onset  . Breast cancer Mother   . Cancer Father   . Heart attack Brother   . Heart attack Brother 42   ______________________________________________________________________________________________ Allergies: Allergies  Allergen Reactions  . Lisinopril  Cough     Prior to Admission medications   Medication Sig Start Date End Date Taking? Authorizing Provider  acetaminophen  (TYLENOL ) 325 MG tablet Take 2 tablets (650 mg total) by mouth every 4 (four) hours as needed for moderate pain (pain score 4-6). 01/14/23   Arrien, Curlee Doss, MD  apixaban  (ELIQUIS ) 5 MG TABS tablet Take 1 tablet (5 mg total) by mouth 2 (two) times daily. Start taking on 01/17/23 pm 01/14/23   Arrien, Curlee Doss, MD  digoxin  (LANOXIN ) 0.125 MG tablet Take 1 tablet (0.125 mg total) by mouth daily. 09/06/22   Sheryl Donna, NP  empagliflozin  (JARDIANCE ) 25 MG TABS tablet Take 12.5 mg by mouth daily.    [provider]  fenofibrate (TRICOR) 48 MG tablet Take 2 tablets by mouth Daily    [provider]  ferrous sulfate  325 (65 FE) MG tablet Take 1 tablet (325 mg total) by mouth every Monday, Wednesday, and Friday. In the morning Patient not taking: Reported on 07/03/2023 04/07/23   Bensimhon, Rheta Celestine, MD  losartan  (COZAAR ) 25 MG tablet Take 0.5 tablets (12.5  mg total) by mouth daily. 07/03/23 10/01/23  Elmarie Hacking, FNP  losartan  (COZAAR ) 25 MG tablet Take 0.5 tablets (12.5 mg total) by mouth daily. 07/03/23   Milford, Arlice Bene, FNP  melatonin 3 MG TABS tablet Take 6 mg by mouth at bedtime as needed (sleep). 02/03/22   [provider]  mexiletine (MEXITIL ) 200 MG capsule Take 1 capsule (200 mg total) by mouth every 12 (twelve) hours. 01/30/23   Bensimhon, Rheta Celestine, MD  omeprazole  (PRILOSEC) 20 MG capsule Take 1 capsule (20 mg total) by mouth daily. Patient not taking: Reported on 07/03/2023 01/30/23   Bensimhon, Daniel R, MD   polyethylene glycol (MIRALAX  / GLYCOLAX ) 17 g packet Take 17 g by mouth daily. Patient taking differently: Take 17 g by mouth daily as needed (constipation.). 01/15/23   Arrien, Curlee Doss, MD  spironolactone  (ALDACTONE ) 25 MG tablet Take 0.5 tablets (12.5 mg total) by mouth in the morning. 04/07/23   Bensimhon, Rheta Celestine, MD  torsemide  (DEMADEX ) 20 MG tablet Take 1 tablet (20 mg total) by mouth daily. 01/30/23   Bensimhon, Rheta Celestine, MD    ___________________________________________________________________________________________________ Physical Exam:    07/11/2023    9:17 PM 07/03/2023    3:21 PM 05/19/2023    8:50 AM  Vitals with BMI  Weight  211 lbs 13 oz   Systolic 141 90 101  Diastolic 87 60 69  Pulse 61 52 61     1. General:  in No  Acute distress   Chronically ill  -appearing 2. Psychological: Alert and   Oriented 3. Head/ENT:   Moist  Mucous Membranes                          Head Non traumatic, neck supple                        Poor Dentition 4. SKIN:  decreased Skin turgor,  Skin clean Dry and intact no rash    5. Heart: Regular rate and rhythm no*** Murmur, no Rub or gallop 6. Lungs: ***Clear to auscultation bilaterally, no wheezes or crackles   7. Abdomen: Soft, ***non-tender, Non distended *** obese ***bowel sounds present 8. Lower extremities: no clubbing, cyanosis, no ***edema 9. Neurologically Grossly intact, moving all 4 extremities equally *** strength 5 out of 5 in all 4 extremities cranial nerves II through XII intact 10. MSK: Normal range of motion    Chart has been reviewed  ______________________________________________________________________________________________  Assessment/Plan 68 y.o. male with medical history significant of   systolic CHF EF 20%, a.fib on Eliquis , CKA 3b, anemia, minimal CAD, cardiogenic shock requiring milrinone , BPH, COPD, HLD, ICD in place  Admitted for   Acute on chronic congestive heart failure,    Bladder outlet  obstruction  Elevated troponin     Present on Admission: **None**     No problem-specific Assessment & Plan notes found for this encounter.    Other plan as per orders.  DVT prophylaxis:  eliquis      Code Status:    Code Status: Prior FULL CODE *** DNR/DNI ***comfort care as per patient ***family  I had personally discussed CODE STATUS with patient and family*  ACP *** none has been reviewed ***   Family Communication:   Family not at  Bedside  plan of care was discussed on the phone with *** Son, Daughter, Wife, Husband, Sister, Brother , father, mother  Diet  Diet Orders (From  admission, onward)     Start     Ordered   07/11/23 2135  Diet NPO time specified  Diet effective now        07/11/23 2135            Disposition Plan:   *** likely will need placement for rehabilitation                          Back to current facility when stable                            To home once workup is complete and patient is stable  ***Following barriers for discharge:                             Chest pain *** Stroke *** Syncope ***work up is complete                            Electrolytes corrected                               Anemia corrected h/H stable                             Pain controlled with PO medications                               Afebrile, white count improving able to transition to PO antibiotics                             Will need to be able to tolerate PO                            Will likely need home health, home O2, set up                           Will need consultants to evaluate patient prior to discharge                           Work of breathing improves                        ***Would benefit from PT/OT eval prior to DC  Ordered                   Swallow eval - SLP ordered                   Diabetes care coordinator                   Transition of care consulted                   Nutrition    consulted                  Wound care   consulted  Palliative care    consulted                   Behavioral health  consulted                    Consults called: ***  NONE   Admission status:  ED Disposition     ED Disposition  Admit   Condition  --   Comment  The patient appears reasonably stabilized for admission considering the current resources, flow, and capabilities available in the ED at this time, and I doubt any other [facility] requiring further screening and/or treatment in the ED prior to admission is  present.           Obs***  ***  inpatient     I Expect 2 midnight stay secondary to severity of patient's current illness need for inpatient interventions justified by the following: ***hemodynamic instability despite optimal treatment (tachycardia *hypotension * tachypnea *hypoxia, hypercapnia) *** Severe lab/radiological/exam abnormalities including:    Acute on chronic congestive heart failure, unspecified heart failure type (HCC) ***  Bladder outlet obstruction ***  Elevated troponin ***  Hypoxia ***  and extensive comorbidities including: *substance abuse  *Chronic pain *DM2  * CHF * CAD  * COPD/asthma *Morbid Obesity * CKD *dementia *liver disease *history of stroke with residual deficits *  malignancy, * sickle cell disease  History of amputation Chronic anticoagulation  That are currently affecting medical management.   I expect  patient to be hospitalized for 2 midnights requiring inpatient medical care.  Patient is at high risk for adverse outcome (such as loss of life or disability) if not treated.  Indication for inpatient stay as follows:  Severe change from baseline regarding mental status Hemodynamic instability despite maximal medical therapy,  severe pain requiring acute inpatient management,  inability to maintain oral hydration   persistent chest pain despite medical management Need for operative/procedural  intervention New or worsening  hypoxia ongoing suicidal ideations   Need for IV antibiotics, IV fluids,, IV pain medications, IV anticoagulation,  IV rate controling medications, IV antihypertensives need for biPAP Frequent labs    Level of care   *** tele  For 12H 24H     medical floor       progressive     stepdown   tele indefinitely please discontinue once patient no longer qualifies COVID-19 Labs    Critical***  Patient is critically ill due to  hemodynamic instability * respiratory failure *severe sepsis* ongoing chest pain*  They are at high risk for life/limb threatening clinical deterioration requiring frequent reassessment and modifications of care.  Services provided include examination of the patient, review of relevant ancillary tests, prescription of lifesaving therapies, review of medications and prophylactic therapy.  Total critical care time excluding separately billable procedures: 60*  Minutes.    Delphine Sizemore 07/11/2023, 11:52 PM ***  [facility]     after 2 AM please page floor coverage   If 7AM-7PM, please contact the day team taking care of the patient using [email or web address]

## 2023-07-11 NOTE — H&P (Signed)
 Austin [name] MVH:[id number] DOB: 01-Jan-1956 DOA: 07/11/2023     PCP: Madonna Schlein, NP   Outpatient Specialists:   CARDS:  Elmarie Hacking, FNP Cardiology    Patient arrived to ER on 07/11/23 at 2111 Referred by Attending Rolinda Climes, DO   Patient coming from:    home Lives alone,      Chief Complaint:   Chief Complaint  Patient presents with   Abdominal Pain   Shortness of Breath    HPI: Austin [name] is a 68 y.o. male with medical history significant of   systolic CHF EF 20%, a.fib on Eliquis , CKA 3b, anemia, minimal CAD, cardiogenic shock requiring milrinone , BPH, COPD, HLD, ICD in place    Presented with  shortness of breath Here with SOB, increased work of breathing EF of less than 20% Stated could not sleep because he was so short of breath CT showed pleural effusion infiltrate vs edema And BPH Patient endorses urinary retention    Reports has been drinking a lot of water because his abdomen has been burning, has been vomiting Sputum productive of brown sputum  Could not eat anything  Has been drinking water or grapefruit juice  When he lays down flat he is gasping for breath  Hurts everywhere Thought he has a virus Reports body aches  No fever but feels like he got the flu No chest pain    Denies significant ETOH intake  ued to drink heavy Does not smoke    Occasional Marijuana Regarding pertinent Chronic problems:     Hyperlipidemia -  on  tricor Lipid Panel     Component Value Date/Time   CHOL 156 08/11/2022 0001   TRIG 103 08/11/2022 0001   HDL 35 (L) 08/11/2022 0001   CHOLHDL 4.5 08/11/2022 0001   VLDL 21 08/11/2022 0001   LDLCALC 100 (H) 08/11/2022 0001     HTN on Torsemide , spironolactone  cozaar    chronic CHF  systolic  -   Torsemide , spironolactone  cozaar   last echo  Recent Results (from the past 84132 hours)  ECHOCARDIOGRAM COMPLETE   Collection Time: 05/08/23  1:32 PM  Result Value   S' Lateral 7.50    Single Plane A4C EF 5.9   Single Plane A2C EF 9.7   Calc EF 6.0   Area-P 1/2 4.15   AR max vel 2.96   AV Peak grad 5.4   Ao pk vel 1.16   Est EF < 20%   Narrative      ECHOCARDIOGRAM REPORT     IMPRESSIONS    1. Left ventricular ejection fraction, by estimation, is <20%. The left ventricle has severely decreased function. The left ventricle demonstrates regional wall motion abnormalities (see scoring diagram/findings for description). The left ventricular  internal cavity size was severely dilated. Left ventricular diastolic function could not be evaluated.  2. Right ventricular systolic function is normal. The right ventricular size is normal. There is normal pulmonary artery systolic pressure.  3. Right atrial size was moderately dilated.  4. The mitral valve is normal in structure. Trivial mitral valve regurgitation. No evidence of mitral stenosis.  5. The aortic valve is tricuspid. Aortic valve regurgitation is not visualized. No aortic stenosis is present.  6. The inferior vena cava is normal in size with greater than 50% respiratory variability, suggesting right atrial pressure of 3 mmHg.           CAD  -  minimal followed by cardiology  COPD - not  followed by pulmonology   not  on baseline oxygen        A. Fib -   atrial fibrillation CHA2DS2 vas score   4       current  on anticoagulation with Eliquis ,           -  Rate control:  Currently controlled with  digoxin           CKD stage IIIb   baseline Cr 1.7 Estimated Creatinine Clearance: 61.1 mL/min (A) (by C-G formula based on SCr of 1.4 mg/dL (H)).  Lab Results  Component Value Date   CREATININE 1.40 (H) 07/11/2023   CREATININE 1.91 (H) 07/03/2023   CREATININE 1.70 (H) 05/08/2023   Lab Results  Component Value Date   NA 140 07/11/2023   CL 109 07/11/2023   K 3.5 07/11/2023   CO2 19 (L) 07/11/2023   BUN 18 07/11/2023   CREATININE 1.40 (H) 07/11/2023   GFRNONAA 54 (L)  07/11/2023   CALCIUM 9.1 07/11/2023   PHOS 3.9 01/12/2023   ALBUMIN 3.8 07/11/2023   GLUCOSE 123 (H) 07/11/2023     Hepatic Function Panel     Component Value Date/Time   PROT 6.6 07/11/2023 2138   ALBUMIN 3.8 07/11/2023 2138   AST 25 07/11/2023 2138   ALT 14 07/11/2023 2138   ALKPHOS 38 07/11/2023 2138   BILITOT 2.0 (H) 07/11/2023 2138   BILIDIR 0.6 (H) 08/07/2022 0912   IBILI 2.1 (H) 08/07/2022 0912   1.3   BPH - not on Flomax       Chronic anemia - baseline hg Hemoglobin & Hematocrit  Recent Labs    05/08/23 1516 07/03/23 1614 07/11/23 2138  HGB 10.6* 10.5* 9.4*   Iron /TIBC/Ferritin/ %Sat    Component Value Date/Time   IRON  11 (L) 01/05/2023 1649   TIBC 438 01/05/2023 1649   FERRITIN 17 (L) 01/05/2023 1649   IRONPCTSAT 3 (L) 01/05/2023 1649      While in ER:    Mildly hypoxic Noted edema vs infiltrate Started on BiPAP for increased work of breathing  Foley placed for urinary retention    Lab Orders         Comprehensive metabolic panel         Lipase, blood         CBC with Diff         Urinalysis, Routine w reflex microscopic -Urine, Clean Catch         Protime-INR         Magnesium         Brain natriuretic peptide         Digoxin  level       CXR -  1. Left chest cardiac assist device is again noted. The prior study demonstrated a single lead terminating in the right ventricle. Currently there is a wire terminating in the right ventricle, another in the right atrium and a third AID wire, the latter which does not have the usual positioning and may be folded back on itself in the coronary sinus. 2. No evidence of acute chest disease.  CTabd/pelvis chest- Negative for acute aortic dissection or aneurysm. 2. Small right greater than left pleural effusions. Mild ground-glass density within the bilateral right greater than left perihilar lungs, either due to mild edema or infection. Mild cardiomegaly. 3. Markedly enlarged prostate.  Following  Medications were ordered in ER: Medications  ipratropium-albuterol  (DUONEB) 0.5-2.5 (3) MG/3ML nebulizer solution 3 mL (3 mLs Nebulization Given  07/11/23 2237)  oxyCODONE -acetaminophen  (PERCOCET/ROXICET) 5-325 MG per tablet 1 tablet (1 tablet Oral Given 07/11/23 2339)  ondansetron  (ZOFRAN -ODT) disintegrating tablet 4 mg (4 mg Oral Given 07/11/23 2339)  furosemide  (LASIX ) injection 40 mg (40 mg Intravenous Given 07/11/23 2339)  iohexol  (OMNIPAQUE ) 350 MG/ML injection 100 mL (100 mLs Intravenous Contrast Given 07/11/23 2301)        ED Triage Vitals  Encounter Vitals Group     BP 07/11/23 2117 (!) 141/87     Systolic BP Percentile --      Diastolic BP Percentile --      Pulse Rate 07/11/23 2117 61     Resp 07/11/23 2117 18     Temp 07/11/23 2117 98 F (36.7 C)     Temp src --      SpO2 07/11/23 2117 92 %     Weight --      Height --      Head Circumference --      Peak Flow --      Pain Score 07/11/23 2339 8     Pain Loc --      Pain Education --      Exclude from Growth Chart --   VHQI(69)@     _________________________________________ Significant initial  Findings: Abnormal Labs Reviewed  COMPREHENSIVE METABOLIC PANEL WITH GFR - Abnormal; Notable for the following components:      Result Value   CO2 19 (*)    Glucose, Bld 123 (*)    Creatinine, Ser 1.40 (*)    Total Bilirubin 2.0 (*)    GFR, Estimated 54 (*)    All other components within normal limits  CBC WITH DIFFERENTIAL/PLATELET - Abnormal; Notable for the following components:   WBC 13.9 (*)    Hemoglobin 9.4 (*)    HCT 32.8 (*)    MCV 74.4 (*)    MCH 21.3 (*)    MCHC 28.7 (*)    RDW 21.2 (*)    Neutro Abs 11.9 (*)    Monocytes Absolute 1.1 (*)    All other components within normal limits  PROTIME-INR - Abnormal; Notable for the following components:   Prothrombin Time 16.3 (*)    INR 1.3 (*)    All other components within normal limits  BRAIN NATRIURETIC PEPTIDE - Abnormal; Notable for the following  components:   B Natriuretic Peptide 3,104.9 (*)    All other components within normal limits  TROPONIN I (HIGH SENSITIVITY) - Abnormal; Notable for the following components:   Troponin I (High Sensitivity) 70 (*)    All other components within normal limits      _________________________ Troponin  ordered Cardiac Panel (last 3 results) Recent Labs    07/11/23 2138  TROPONINIHS 70*     ECG: Ordered Personally reviewed and interpreted by me showing: HR : 92 Rhythm:trial-sensed ventricular-paced rhythm with prolonged AV conduction with occasional Premature ventricular complexes Abnormal ECG QTC 516  BNP (last 3 results) Recent Labs    05/08/23 1516 07/03/23 1614 07/11/23 2138  BNP 292.5* 356.0* 3,104.9*       The recent clinical data is shown below. Vitals:   07/11/23 2117 07/11/23 2127 07/11/23 2238  BP: (!) 141/87    Pulse: 61    Resp: 18    Temp: 98 F (36.7 C)    SpO2: 92% 92% 100%    WBC     Component Value Date/Time   WBC 13.9 (H) 07/11/2023 2138   LYMPHSABS 0.8 07/11/2023 2138  MONOABS 1.1 (H) 07/11/2023 2138   EOSABS 0.0 07/11/2023 2138   BASOSABS 0.0 07/11/2023 2138     Procalcitonin   Ordered      UA ordered     ABX started rocephin azithromycin Antibiotics Given (last 72 hours)     None        No results found for the last 90 days.   VBG pending   __________________________________________________________ Recent Labs  Lab 07/11/23 2138  NA 140  K 3.5  CO2 19*  GLUCOSE 123*  BUN 18  CREATININE 1.40*  CALCIUM 9.1  MG 2.1    Cr    stable,   Lab Results  Component Value Date   CREATININE 1.40 (H) 07/11/2023   CREATININE 1.91 (H) 07/03/2023   CREATININE 1.70 (H) 05/08/2023    Recent Labs  Lab 07/11/23 2138  AST 25  ALT 14  ALKPHOS 38  BILITOT 2.0*  PROT 6.6  ALBUMIN 3.8   Lab Results  Component Value Date   CALCIUM 9.1 07/11/2023   PHOS 3.9 01/12/2023        Plt: Lab Results  Component Value Date    PLT 230 07/11/2023       Recent Labs  Lab 07/11/23 2138  WBC 13.9*  NEUTROABS 11.9*  HGB 9.4*  HCT 32.8*  MCV 74.4*  PLT 230    HG/HCT   stable,      Component Value Date/Time   HGB 9.4 (L) 07/11/2023 2138   HCT 32.8 (L) 07/11/2023 2138   MCV 74.4 (L) 07/11/2023 2138      Recent Labs  Lab 07/11/23 2138  LIPASE 26     _______________________________________________ Hospitalist was called for admission for   Acute on chronic congestive heart failure,   Bladder outlet obstruction   Elevated troponin      The following Work up has been ordered so far:  Orders Placed This Encounter  Procedures   Critical Care   DG Abd Acute W/Chest   CT Angio Chest/Abd/Pel for Dissection W and/or Wo Contrast   Comprehensive metabolic panel   Lipase, blood   CBC with Diff   Urinalysis, Routine w reflex microscopic -Urine, Clean Catch   Protime-INR   Magnesium   Brain natriuretic peptide   Digoxin  level   Diet NPO time specified   Initiate Carrier Fluid Protocol   Bladder scan   Insert foley catheter   Consult for Unassigned Medical Admission   Bipap   EKG 12-Lead   Insert peripheral IV     OTHER Significant initial  Findings:  labs showing:     DM  labs:  HbA1C: Recent Labs    08/11/22 0001  HGBA1C 6.2*       CBG (last 3)  No results for input(s): "GLUCAP" in the last 72 hours.        Cultures:    Component Value Date/Time   SDES BLOOD LEFT ANTECUBITAL 08/09/2022 1810   SDES BLOOD LEFT HAND 08/09/2022 1810   SPECREQUEST  08/09/2022 1810    BOTTLES DRAWN AEROBIC AND ANAEROBIC Blood Culture adequate volume   SPECREQUEST  08/09/2022 1810    BOTTLES DRAWN AEROBIC AND ANAEROBIC Blood Culture adequate volume   CULT  08/09/2022 1810    NO GROWTH 5 DAYS Performed at [facility], [address], [place], [place] [address]    CULT  08/09/2022 1810    NO GROWTH 5 DAYS Performed at [facility], [address], [place], [place] [address]  REPTSTATUS 08/14/2022 FINAL 08/09/2022 1810   REPTSTATUS 08/14/2022 FINAL 08/09/2022 1810     Radiological Exams on Admission: CT Angio Chest/Abd/Pel for Dissection W and/or Wo Contrast Result Date: 07/11/2023 CLINICAL DATA:  Shortness of breath lower abdominal pain tachypnea EXAM: CT ANGIOGRAPHY CHEST, ABDOMEN AND PELVIS TECHNIQUE: Non-contrast CT of the chest was initially obtained. Multidetector CT imaging through the chest, abdomen and pelvis was performed using the standard protocol during bolus administration of intravenous contrast. Multiplanar reconstructed images and MIPs were obtained and reviewed to evaluate the vascular anatomy. RADIATION DOSE REDUCTION: This exam was performed according to the departmental dose-optimization program which includes automated exposure control, adjustment of the mA and/or kV according to patient size and/or use of iterative reconstruction technique. CONTRAST:  OMNIPAQUE  IOHEXOL  350 MG/ML SOLN COMPARISON:  CT 01/05/2023 FINDINGS: CTA CHEST FINDINGS Cardiovascular: Non contrasted images of the chest demonstrate no acute intramural hematoma. Nonaneurysmal aorta. No dissection is seen. Left-sided multi lead cardiac pacing device. Mild cardiomegaly. No pericardial effusion Mediastinum/Nodes: Patent trachea. No thyroid  mass. No suspicious lymph nodes. Esophagus within normal limits Lungs/Pleura: Small right greater than left pleural effusions. Mild emphysema. Mild ground-glass density within the bilateral right greater than left perihilar lungs. Musculoskeletal: Sternum appears intact. No acute osseous abnormality Review of the MIP images confirms the above findings. CTA ABDOMEN AND PELVIS FINDINGS VASCULAR Aorta: Normal caliber aorta without aneurysm, dissection, vasculitis or significant stenosis. Celiac: Patent without evidence of aneurysm, dissection, vasculitis or significant stenosis. SMA: Patent without evidence of aneurysm, dissection, vasculitis or  significant stenosis. Renals: Both renal arteries are patent without evidence of aneurysm, dissection, vasculitis, fibromuscular dysplasia or significant stenosis. IMA: Patent without evidence of aneurysm, dissection, vasculitis or significant stenosis. Inflow: Patent without evidence of aneurysm, dissection, vasculitis or significant stenosis. Veins: Suboptimally assessed Review of the MIP images confirms the above findings. NON-VASCULAR Hepatobiliary: No focal liver abnormality is seen. No gallstones, gallbladder wall thickening, or biliary dilatation. Pancreas: Unremarkable. No pancreatic ductal dilatation or surrounding inflammatory changes. Spleen: Normal in size. Small hyperenhancing focus measuring 5 mm within the anterior spleen, unchanged. Adrenals/Urinary Tract: Adrenal glands are normal. Kidneys show no hydronephrosis. The bladder is diffusely thick walled. Stomach/Bowel: Stomach is nonenlarged. No dilated small bowel. No acute bowel wall thickening. Diverticular disease of the sigmoid colon. Lymphatic: No suspicious lymph nodes Reproductive: Markedly enlarged prostate Other: Negative for pelvic effusion or free air. Small fat containing inguinal hernias. Small fat containing periumbilical hernia. Musculoskeletal: No acute osseous abnormality. Review of the MIP images confirms the above findings. IMPRESSION: 1. Negative for acute aortic dissection or aneurysm. 2. Small right greater than left pleural effusions. Mild ground-glass density within the bilateral right greater than left perihilar lungs, either due to mild edema or infection. Mild cardiomegaly. 3. Markedly enlarged prostate. Diffuse bladder wall thickening likely due to chronic bladder outlet obstruction versus cystitis. 4. Diverticular disease of the sigmoid colon without acute inflammatory process. Electronically Signed   By: Esmeralda Hedge M.D.   On: 07/11/2023 23:19   DG Abd Acute W/Chest Result Date: 07/11/2023 CLINICAL DATA:  Shortness  of breath and lower abdominal pain x3 days. EXAM: DG ABDOMEN ACUTE WITH 1 VIEW CHEST COMPARISON:  January 14, 2023 FINDINGS: Stable multilead AICD positioning is noted. There is no evidence of dilated bowel loops or free intraperitoneal air. No radiopaque calculi or other significant radiographic abnormality is seen. Heart size and mediastinal contours are within normal limits. There is no evidence of an acute infiltrate, pleural effusion or pneumothorax. IMPRESSION: Negative  abdominal radiographs.  No acute cardiopulmonary disease. Electronically Signed   By: Virgle Grime M.D.   On: 07/11/2023 22:23   _______________________________________________________________________________________________________ Latest  Blood pressure (!) 141/87, pulse 61, temperature 98 F (36.7 C), resp. rate 18, SpO2 100%.   Vitals  labs and radiology finding personally reviewed  Review of Systems:    Pertinent positives include:  , fatigue, shortness of breath at rest. No dyspnea on exertion Constitutional:  No weight loss, night sweats, Fevers, chills weight loss  HEENT:  No headaches, Difficulty swallowing,Tooth/dental problems,Sore throat,  No sneezing, itching, ear ache, nasal congestion, post nasal drip,  Cardio-vascular:  No chest pain, Orthopnea, PND, anasarca, dizziness, palpitations.no Bilateral lower extremity swelling  GI:  No heartburn, indigestion, abdominal pain, nausea, vomiting, diarrhea, change in bowel habits, loss of appetite, melena, blood in stool, hematemesis Resp:  no , No excess mucus, no productive cough, No non-productive cough, No coughing up of blood.No change in color of mucus.No wheezing. Skin:  no rash or lesions. No jaundice GU:  no dysuria, change in color of urine, no urgency or frequency. No straining to urinate.  No flank pain.  Musculoskeletal:  No joint pain or no joint swelling. No decreased range of motion. No back pain.  Psych:  No change in mood or affect. No  depression or anxiety. No memory loss.  Neuro: no localizing neurological complaints, no tingling, no weakness, no double vision, no gait abnormality, no slurred speech, no confusion  All systems reviewed and apart from HOPI all are negative _______________________________________________________________________________________________ Past Medical History:   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   Single Chamber Biotronik   Myocardial infarction [facility]) 2012   12/31/2013   Nonischemic cardiomyopathy (HCC) 11/15/2010   cath 12/10/10 minimal nonobstructive coronary artery disease   Olecranon bursitis    Squamous cell carcinoma of arm, 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   Systolic heart failure 11/15/2010   echo 12/08/10 EF 20%, severe left ventricular enlargement, mild right ventricular enlargement      Past Surgical History:  Procedure Laterality Date   BIV UPGRADE N/A 01/13/2023   Procedure: BIV UPGRADE;  Surgeon: Verona Goodwill, MD;  Location: [facility] INVASIVE CV LAB;  Service: Cardiovascular;  Laterality: N/A;   CARDIOVERSION N/A 08/26/2022   Procedure: CARDIOVERSION;  Surgeon: Mardell Shade, MD;  Location: [facility] INVASIVE CV LAB;  Service: Cardiovascular;  Laterality: N/A;   CARDIOVERSION N/A 05/19/2023   Procedure: CARDIOVERSION;  Surgeon: Mardell Shade, MD;  Location: [facility] INVASIVE CV LAB;  Service: Cardiovascular;  Laterality: N/A;   HAND SURGERY  02/14/1977   left hand machine trauma   Right elbow mass excisional biopsy Right 12/13/2022   RIGHT/LEFT HEART CATH AND CORONARY  ANGIOGRAPHY N/A 08/10/2022   Procedure: RIGHT/LEFT HEART CATH AND CORONARY ANGIOGRAPHY;  Surgeon: Mardell Shade, MD;  Location: [facility] INVASIVE CV LAB;  Service: Cardiovascular;  Laterality: N/A;   TEE WITHOUT CARDIOVERSION N/A 08/26/2022   Procedure: TRANSESOPHAGEAL ECHOCARDIOGRAM;  Surgeon: Mardell Shade, MD;  Location: [facility] INVASIVE CV LAB;  Service: Cardiovascular;  Laterality: N/A;   TRANSURETHRAL RESECTION OF PROSTATE N/A 09/09/2021   UPPER EXTREMITY VENOGRAPHY N/A 01/10/2023   Procedure: UPPER EXTREMITY VENOGRAPHY;  Surgeon: Boyce Byes, MD;  Location: [facility] INVASIVE CV LAB;  Service: Cardiovascular;  Laterality: N/A;    Social History:  Ambulatory   independently       reports that he quit smoking about 30 years ago. His smoking use included cigarettes. He started smoking about 44 years ago. He has a 28 pack-year smoking history. He does not have any smokeless tobacco history on file. He reports current alcohol use of about 3.0 standard drinks of alcohol per week. He reports that he does not currently use drugs after having used the following drugs: Marijuana.   Family History:   Family History  Problem Relation Age of Onset   Breast cancer Mother    Cancer Father    Heart attack Brother    Heart attack Brother 2   ______________________________________________________________________________________________ Allergies: Allergies  Allergen Reactions   Lisinopril  Cough     Prior to Admission medications   Medication Sig Start Date End Date Taking? Authorizing Provider  acetaminophen  (TYLENOL ) 325 MG tablet Take 2 tablets (650 mg total) by mouth every 4 (four) hours as needed for moderate pain (pain score 4-6). 01/14/23   Arrien, Curlee Doss, MD  apixaban  (ELIQUIS ) 5 MG TABS tablet Take 1 tablet (5 mg total) by mouth 2 (two) times daily. Start taking on 01/17/23 pm 01/14/23   Arrien, Curlee Doss, MD  digoxin  (LANOXIN ) 0.125 MG tablet Take 1 tablet (0.125 mg  total) by mouth daily. 09/06/22   Sheryl Donna, NP  empagliflozin  (JARDIANCE ) 25 MG TABS tablet Take 12.5 mg by mouth daily.    [provider]  fenofibrate (TRICOR) 48 MG tablet Take 2 tablets by mouth Daily    [provider]  ferrous sulfate  325 (65 FE) MG tablet Take 1 tablet (325 mg total) by mouth every Monday, Wednesday, and Friday. In the morning Patient not taking: Reported on 07/03/2023 04/07/23   Bensimhon, Rheta Celestine, MD  losartan  (COZAAR ) 25 MG tablet Take 0.5 tablets (12.5 mg total) by mouth daily. 07/03/23 10/01/23  Elmarie Hacking, FNP  losartan  (COZAAR ) 25 MG tablet Take 0.5 tablets (12.5 mg total) by mouth daily. 07/03/23   Milford, Arlice Bene, FNP  melatonin 3 MG TABS tablet Take 6 mg by mouth at bedtime as needed (sleep). 02/03/22   [provider]  mexiletine (MEXITIL ) 200 MG capsule Take 1 capsule (200 mg total) by mouth every 12 (twelve) hours. 01/30/23   Bensimhon, Rheta Celestine, MD  omeprazole  (PRILOSEC) 20 MG capsule Take 1 capsule (20 mg total) by mouth daily. Patient not taking: Reported on 07/03/2023 01/30/23   Bensimhon, Daniel R, MD  polyethylene glycol (MIRALAX  / GLYCOLAX ) 17 g packet Take 17 g by mouth daily. Patient taking differently: Take 17 g by mouth daily as needed (constipation.). 01/15/23   Arrien, Curlee Doss, MD  spironolactone  (ALDACTONE ) 25 MG tablet Take 0.5 tablets (12.5 mg total) by mouth in the morning. 04/07/23   Bensimhon, Rheta Celestine, MD  torsemide  (DEMADEX ) 20 MG tablet Take 1 tablet (20 mg total) by mouth daily. 01/30/23   Bensimhon, Rheta Celestine, MD    ___________________________________________________________________________________________________ Physical Exam:    07/11/2023    9:17 PM 07/03/2023    3:21 PM 05/19/2023    8:50 AM  Vitals with BMI  Weight  211 lbs 13 oz   Systolic 141 90 101  Diastolic 87  60 69  Pulse 61 52 61     1. General:  in No  Acute distress   Chronically ill  -appearing 2. Psychological: Alert  and   Oriented 3. Head/ENT:   Moist  Mucous Membranes                          Head Non traumatic, neck supple                        Poor Dentition 4. SKIN:  decreased Skin turgor,  Skin clean Dry and intact no rash    5. Heart: Regular rate and rhythm no  Murmur, no Rub or gallop 6. Lungs:   no wheezes or crackles   7. Abdomen: Soft, lower abd -tender, Non distended  bowel sounds present 8. Lower extremities: no clubbing, cyanosis, no  edema 9. Neurologically Grossly intact, moving all 4 extremities equally  10. MSK: Normal range of motion    Chart has been reviewed  ______________________________________________________________________________________________  Assessment/Plan 68 y.o. male with medical history significant of   systolic CHF EF 20%, a.fib on Eliquis , CKA 3b, anemia, minimal CAD, cardiogenic shock requiring milrinone , BPH, COPD, HLD, ICD in place  Admitted for   Acute on chronic congestive heart failure,   vs early cap Bladder outlet obstruction  Elevated troponin     Present on Admission:  Acute on chronic systolic (congestive) heart failure (HCC)  Acute on chronic systolic CHF (congestive heart failure) (HCC)  Essential hypertension  Atrial fibrillation (HCC)  CKD stage 3b, GFR 30-44 ml/min (HCC)  Anemia  Biventricular ICD (implantable cardioverter-defibrillator) in place  Acute urinary retention  COPD (chronic obstructive pulmonary disease) (HCC)  BPH (benign prostatic hyperplasia)  Acute respiratory failure with hypoxia (HCC)  CAP (community acquired pneumonia)  Prolonged QT interval  Diarrhea     Acute on chronic systolic CHF (congestive heart failure) (HCC) - Pt diagnosed with CHF based on presence of the following:  PND,  , rales on exam,  cardiomegaly, Pulmonary edema on CXR, and    pleural effusion  With noted response to IV diuretic in ER  admit on telemetry,  cycle cardiac enzymes, Cardiac Panel (last 3 results) Recent Labs     07/11/23 2138  TROPONINIHS 70*     obtain serial ECG  to evaluate for ischemia as a cause of heart failure  Last BNP BNP (last 3 results) Recent Labs    05/08/23 1516 07/03/23 1614 07/11/23 2138  BNP 292.5* 356.0* 3,104.9*     diurese with IV lasix    40 mg IV q day    and monitor orthostatics and creatinine to avoid over diuresis.  Order echogram to evaluate EF and valves  ACE/ARBi ordered    cardiology  emailed   Essential hypertension Continue Losartan  12.5 po daily  Atrial fibrillation (HCC) Conitnue eliquis   And digoxin  unless level is elevated  CKD stage 3b, GFR 30-44 ml/min (HCC)  -chronic avoid nephrotoxic medications such as NSAIDs, Vanco Zosyn combo,  avoid hypotension, continue to follow renal function   Anemia Obtain anemia panel  Transfuse for Hg <8 , rapidly dropping or  if symptomatic   Biventricular ICD (implantable cardioverter-defibrillator) in place Chronic stable  Acute urinary retention Strict I's and O's if Evidence of significant retention and postvoid residual elevated will place Foley catheter. Initiate Flomax  0.4 mg daily  COPD (chronic obstructive pulmonary disease) (HCC) Chronic stable continue home medications  BPH (  benign prostatic hyperplasia) Start Flomax   Acute respiratory failure with hypoxia (HCC)  this patient has acute respiratory failure with Hypoxia   as documented by the presence of following: O2 saturatio< 90% on RA   Likely due to:   CHF exacerbation,   Provide O2 therapy and titrate as needed  Continuous pulse ox   check Pulse ox with ambulation prior to discharge   may need  TC consult for home O2 set up    flutter valve ordered   CAP (community acquired pneumonia)  - -Patient presenting with  productive cough  hypoxia  , and infiltrate in   lower lobe on chest x-ray -Infiltrate on CXR and 2-3 characteristics (fever, leukocytosis, purulent sputum) are consistent with pneumonia. -This appears to be most  likely community-acquired pneumonia.        will admit for treatment of CAP will start on appropriate antibiotic coverage. - Rocephin/doxycycline   Obtain:  sputum cultures,                  Obtain respiratory panel                    influenza serologies negative                COVID Pending but unlikely                blood cultures and sputum cultures ordered                   strep pneumo UA antigen,                   check for Legionella antigen.                Provide oxygen as needed.    Prolonged QT interval - will monitor on tele avoid QT prolonging medications, rehydrate correct electrolytes   Diarrhea Isolated , if recurs can order gastric panel  Other plan as per orders.  DVT prophylaxis:  eliquis      Code Status:    Code Status: Prior FULL CODE as per patient   I had personally discussed CODE STATUS with patient   ACP   none    Family Communication:   Family not at  Bedside    Diet  Diet Orders (From admission, onward)     Start     Ordered   07/11/23 2135  Diet NPO time specified  Diet effective now        07/11/23 2135            Disposition Plan:        To home once workup is complete and patient is stable   Following barriers for discharge:                                                       Will need consultants to evaluate patient prior to discharge                           Work of breathing improves                        Would benefit from PT/OT eval prior to DC  Ordered  Consults called:  cardiology emailed   Admission status:  ED Disposition     ED Disposition  Admit   Condition  --   Comment  Hospital Area: [facility] [[id number]]  Level of Care: Progressive [102]  Admit to Progressive based on following criteria: CARDIOVASCULAR & THORACIC of moderate stability with acute coronary syndrome symptoms/low risk myocardial infarction/hypertensive urgency/arrhythmias/heart failure potentially  compromising stability and stable post cardiovascular intervention patients.  May admit patient to [facility] or [facility] if equivalent level of care is available:: No  Covid Evaluation: Asymptomatic - no recent exposure (last 10 days) testing not required  Diagnosis: Acute on chronic systolic (congestive) heart failure [facility]) [[id number]]  Admitting Physician: Alvan Culpepper [[id number]]  Attending Physician: Tiarrah Saville [[id number]]  Certification:: I certify this patient will need inpatient services for at least 2 midnights  Expected Medical Readiness: 07/14/2023             inpatient     I Expect 2 midnight stay secondary to severity of patient's current illness need for inpatient interventions justified by the following:  hemodynamic instability despite optimal treatment ( hypoxia,  )   Severe lab/radiological/exam abnormalities including:    Acute on chronic congestive heart failure,  Elevated troponin  Acute respiratory failure with hypoxia    and extensive comorbidities including:   CHF  CAD   COPD/asthma CKD  Chronic anticoagulation  That are currently affecting medical management.   I expect  patient to be hospitalized for 2 midnights requiring inpatient medical care.  Patient is at high risk for adverse outcome (such as loss of life or disability) if not treated.  Indication for inpatient stay as follows:    New or worsening hypoxia need for BIPAP    Need for IV antibiotics IV diuretics Frequent labs    Level of care   progressive           Ruhi Kopke 07/12/2023, 12:58 AM    [facility]     after 2 AM please page floor coverage   If 7AM-7PM, please contact the day team taking care of the patient using [email or web address]

## 2023-07-11 NOTE — Subjective & Objective (Addendum)
 Here with SOB, increased work of breathing EF of less than 20% Stated could not sleep because he was so short of breath CT showed pleural effusion infiltrate vs edema And BPH Patient endorses urinary retention

## 2023-07-11 NOTE — ED Provider Notes (Signed)
 [place] [facility] Provider Note   CSN: [id number] Arrival date & time: 07/11/23  2111     History  Chief Complaint  Patient presents with   Abdominal Pain   Shortness of Breath    Austin [name] is a 68 y.o. male.  The history is provided by the patient and medical records. No language interpreter was used.  Abdominal Pain Associated symptoms: shortness of breath   Shortness of Breath Associated symptoms: abdominal pain      68 year old male with history of A-fib currently on Eliquis , CAD, hypertension, emphysema, has ICD, CHF, CKD presented to the ER with complaints of shortness of breath.  Patient states for the past 3 days he has had diffuse abdominal discomfort as well as increased shortness of breath.  He has difficulty sleeping as laying down makes his shortness of breath worse.  He endorsed occasional cough but he also endorsed having nausea vomiting diarrhea.  Abdominal cramping is diffused.  No fever or chills no lightheadedness or dizziness.  He does not normally on oxygen.  He denies having any back pain denies hematochezia or melena no hemoptysis.  Home Medications Prior to Admission medications   Medication Sig Start Date End Date Taking? Authorizing Provider  acetaminophen  (TYLENOL ) 325 MG tablet Take 2 tablets (650 mg total) by mouth every 4 (four) hours as needed for moderate pain (pain score 4-6). 01/14/23   Arrien, Curlee Doss, MD  apixaban  (ELIQUIS ) 5 MG TABS tablet Take 1 tablet (5 mg total) by mouth 2 (two) times daily. Start taking on 01/17/23 pm 01/14/23   Arrien, Curlee Doss, MD  digoxin  (LANOXIN ) 0.125 MG tablet Take 1 tablet (0.125 mg total) by mouth daily. 09/06/22   Sheryl Donna, NP  empagliflozin  (JARDIANCE ) 25 MG TABS tablet Take 12.5 mg by mouth daily.    [provider]  fenofibrate (TRICOR) 48 MG tablet Take 2 tablets by mouth Daily    [provider]  ferrous sulfate  325 (65 FE) MG  tablet Take 1 tablet (325 mg total) by mouth every Monday, Wednesday, and Friday. In the morning Patient not taking: Reported on 07/03/2023 04/07/23   Bensimhon, Rheta Celestine, MD  losartan  (COZAAR ) 25 MG tablet Take 0.5 tablets (12.5 mg total) by mouth daily. 07/03/23 10/01/23  Elmarie Hacking, FNP  losartan  (COZAAR ) 25 MG tablet Take 0.5 tablets (12.5 mg total) by mouth daily. 07/03/23   Milford, Arlice Bene, FNP  melatonin 3 MG TABS tablet Take 6 mg by mouth at bedtime as needed (sleep). 02/03/22   [provider]  mexiletine (MEXITIL ) 200 MG capsule Take 1 capsule (200 mg total) by mouth every 12 (twelve) hours. 01/30/23   Bensimhon, Rheta Celestine, MD  omeprazole  (PRILOSEC) 20 MG capsule Take 1 capsule (20 mg total) by mouth daily. Patient not taking: Reported on 07/03/2023 01/30/23   Bensimhon, Daniel R, MD  polyethylene glycol (MIRALAX  / GLYCOLAX ) 17 g packet Take 17 g by mouth daily. Patient taking differently: Take 17 g by mouth daily as needed (constipation.). 01/15/23   Arrien, Curlee Doss, MD  spironolactone  (ALDACTONE ) 25 MG tablet Take 0.5 tablets (12.5 mg total) by mouth in the morning. 04/07/23   Bensimhon, Rheta Celestine, MD  torsemide  (DEMADEX ) 20 MG tablet Take 1 tablet (20 mg total) by mouth daily. 01/30/23   Bensimhon, Rheta Celestine, MD      Allergies    Lisinopril     Review of Systems   Review of Systems  Respiratory:  Positive for shortness of breath.   Gastrointestinal:  Positive for abdominal pain.  All other systems reviewed and are negative.   Physical Exam Updated Vital Signs BP (!) 141/87 (BP Location: Left Arm)   Pulse 61   Temp 98 F (36.7 C)   Resp 18   SpO2 92%  Physical Exam Constitutional:      General: He is not in acute distress.    Appearance: He is well-developed.  HENT:     Head: Atraumatic.  Eyes:     Conjunctiva/sclera: Conjunctivae normal.  Cardiovascular:     Rate and Rhythm: Normal rate and regular rhythm.  Pulmonary:     Breath sounds: Rhonchi  present.     Comments: Patient appears to be in some respiratory discomfort, occasional cough, has rhonchorous breath sounds. he is wearing supplemental oxygen Abdominal:     Palpations: Abdomen is soft.     Tenderness: There is no abdominal tenderness.  Musculoskeletal:     Cervical back: Normal range of motion and neck supple.     Right lower leg: No edema.     Left lower leg: No edema.  Skin:    Findings: No rash.  Neurological:     Mental Status: He is alert and oriented to person, place, and time.     ED Results / Procedures / Treatments   Labs (all labs ordered are listed, but only abnormal results are displayed) Labs Reviewed  COMPREHENSIVE METABOLIC PANEL WITH GFR - Abnormal; Notable for the following components:      Result Value   CO2 19 (*)    Glucose, Bld 123 (*)    Creatinine, Ser 1.40 (*)    Total Bilirubin 2.0 (*)    GFR, Estimated 54 (*)    All other components within normal limits  CBC WITH DIFFERENTIAL/PLATELET - Abnormal; Notable for the following components:   WBC 13.9 (*)    Hemoglobin 9.4 (*)    HCT 32.8 (*)    MCV 74.4 (*)    MCH 21.3 (*)    MCHC 28.7 (*)    RDW 21.2 (*)    Neutro Abs 11.9 (*)    Monocytes Absolute 1.1 (*)    All other components within normal limits  PROTIME-INR - Abnormal; Notable for the following components:   Prothrombin Time 16.3 (*)    INR 1.3 (*)    All other components within normal limits  BRAIN NATRIURETIC PEPTIDE - Abnormal; Notable for the following components:   B Natriuretic Peptide 3,104.9 (*)    All other components within normal limits  TROPONIN I (HIGH SENSITIVITY) - Abnormal; Notable for the following components:   Troponin I (High Sensitivity) 70 (*)    All other components within normal limits  LIPASE, BLOOD  MAGNESIUM  URINALYSIS, ROUTINE W REFLEX MICROSCOPIC  DIGOXIN  LEVEL    EKG EKG Interpretation Date/Time:  Tuesday Jul 11 2023 21:24:36 EDT Ventricular Rate:  92 PR Interval:  240 QRS  Duration:  128 QT Interval:  418 QTC Calculation: 516 R Axis:   -65  Text Interpretation: Atrial-sensed ventricular-paced rhythm with prolonged AV conduction with occasional Premature ventricular complexes Abnormal ECG When compared with ECG of 03-Jul-2023 15:35, PREVIOUS ECG IS PRESENT Confirmed by Elise Guile [phone]) on 07/11/2023 10:40:59 PM  Radiology DG Abd Acute W/Chest Result Date: 07/11/2023 CLINICAL DATA:  Shortness of breath and lower abdominal pain x3 days. EXAM: DG ABDOMEN ACUTE WITH 1 VIEW CHEST COMPARISON:  January 14, 2023 FINDINGS: Stable multilead AICD positioning is  noted. There is no evidence of dilated bowel loops or free intraperitoneal air. No radiopaque calculi or other significant radiographic abnormality is seen. Heart size and mediastinal contours are within normal limits. There is no evidence of an acute infiltrate, pleural effusion or pneumothorax. IMPRESSION: Negative abdominal radiographs.  No acute cardiopulmonary disease. Electronically Signed   By: Virgle Grime M.D.   On: 07/11/2023 22:23    Procedures .Critical Care  Performed by: Debbra Fairy, PA-C Authorized by: Debbra Fairy, PA-C   Critical care provider statement:    Critical care time (minutes):  35   Critical care was time spent personally by me on the following activities:  Development of treatment plan with patient or surrogate, discussions with consultants, evaluation of patient's response to treatment, examination of patient, ordering and review of laboratory studies, ordering and review of radiographic studies, ordering and performing treatments and interventions, pulse oximetry, re-evaluation of patient's condition and review of old charts     Medications Ordered in ED Medications  ipratropium-albuterol  (DUONEB) 0.5-2.5 (3) MG/3ML nebulizer solution 3 mL (has no administration in time range)  oxyCODONE -acetaminophen  (PERCOCET/ROXICET) 5-325 MG per tablet 1 tablet (has no administration in  time range)  ondansetron  (ZOFRAN -ODT) disintegrating tablet 4 mg (has no administration in time range)    ED Course/ Medical Decision Making/ A&P                                 Medical Decision Making Amount and/or Complexity of Data Reviewed Labs: ordered.  Risk Prescription drug management.   BP (!) 141/87 (BP Location: Left Arm)   Pulse 61   Temp 98 F (36.7 C)   Resp 18   SpO2 92%   51:39 PM  68 year old male with history of A-fib currently on Eliquis , CAD, hypertension, emphysema, has ICD, CHF, CKD presented to the ER with complaints of shortness of breath.  Patient states for the past 3 days he has had diffuse abdominal discomfort as well as increased shortness of breath.  He has difficulty sleeping as laying down makes his shortness of breath worse.  He endorsed occasional cough but he also endorsed having nausea vomiting diarrhea.  Abdominal cramping is diffused.  No fever or chills no lightheadedness or dizziness.  He does not normally on oxygen.  He denies having any back pain denies hematochezia or melena no hemoptysis.  Patient appears to be in some respiratory discomfort, occasional cough, has rhonchorous breath sounds. he is wearing supplemental oxygen  Symptoms suggestive for CHF exacerbation.  Workup initiated.  Will put patient on BiPAP and will give Lasix .  EMR review, patient's last echocardiogram in March of this year shows an EF of less than 20%  -Labs ordered, independently viewed and interpreted by me.  Labs remarkable for creatinine of 1.4, improved from prior.  Elevated white count of 13.9.  Hemoglobin is 9.4.  BNP is markedly elevated at 3000.  Patient also has elevated troponin of 70 likely demand ischemia. -The patient was maintained on a cardiac monitor.  I personally viewed and interpreted the cardiac monitored which showed an underlying rhythm of: Paced rhythm -Imaging independently viewed and interpreted by me and I agree with radiologist's  interpretation.  Result remarkable for initial x-ray of acute abdomen with chest did not show any concerning finding.  Previous provider to evaluate patient have ordered a dissection study and that has not resulted yet. -This patient presents to the ED for concern  of shortness of breath, this involves an extensive number of treatment options, and is a complaint that carries with it a high risk of complications and morbidity.  The differential diagnosis includes CHF exacerbation, COPD, asthma, pneumonia, PE, dissection, colitis, diverticulitis -Co morbidities that complicate the patient evaluation includes A-fib, CAD, hypertension, CHF, CKD -Treatment includes BiPAP, Lasix ,  -Reevaluation of the patient after these medicines showed that the patient improved -PCP office notes or outside notes reviewed -Discussion with specialist [facility] Dr. Hendrick Locke who agrees to admit pt.  I have discussed care with Dr. Linder Revere -Escalation to admission/observation considered: patient is agreeable with admission.   Since patient is having trouble urinating, will perform bladder scan and insert Foley.        Final Clinical Impression(s) / ED Diagnoses Final diagnoses:  Acute on chronic congestive heart failure, unspecified heart failure type [facility])  Bladder outlet obstruction  Elevated troponin  Hypoxia    Rx / DC Orders ED Discharge Orders     None         Debbra Fairy, PA-C 07/11/23 2342    Rolinda Climes, DO 07/12/23 0015

## 2023-07-12 ENCOUNTER — Encounter (HOSPITAL_COMMUNITY): Payer: Self-pay | Admitting: Internal Medicine

## 2023-07-12 ENCOUNTER — Other Ambulatory Visit: Payer: Self-pay

## 2023-07-12 ENCOUNTER — Inpatient Hospital Stay (HOSPITAL_COMMUNITY)

## 2023-07-12 DIAGNOSIS — I480 Paroxysmal atrial fibrillation: Secondary | ICD-10-CM

## 2023-07-12 DIAGNOSIS — J440 Chronic obstructive pulmonary disease with acute lower respiratory infection: Secondary | ICD-10-CM | POA: Diagnosis present

## 2023-07-12 DIAGNOSIS — Z8249 Family history of ischemic heart disease and other diseases of the circulatory system: Secondary | ICD-10-CM | POA: Diagnosis not present

## 2023-07-12 DIAGNOSIS — E785 Hyperlipidemia, unspecified: Secondary | ICD-10-CM | POA: Diagnosis present

## 2023-07-12 DIAGNOSIS — N1832 Chronic kidney disease, stage 3b: Secondary | ICD-10-CM | POA: Diagnosis present

## 2023-07-12 DIAGNOSIS — R338 Other retention of urine: Secondary | ICD-10-CM | POA: Diagnosis present

## 2023-07-12 DIAGNOSIS — R0902 Hypoxemia: Secondary | ICD-10-CM | POA: Diagnosis present

## 2023-07-12 DIAGNOSIS — J9600 Acute respiratory failure, unspecified whether with hypoxia or hypercapnia: Secondary | ICD-10-CM | POA: Diagnosis not present

## 2023-07-12 DIAGNOSIS — I428 Other cardiomyopathies: Secondary | ICD-10-CM | POA: Diagnosis present

## 2023-07-12 DIAGNOSIS — Z7984 Long term (current) use of oral hypoglycemic drugs: Secondary | ICD-10-CM | POA: Diagnosis not present

## 2023-07-12 DIAGNOSIS — E8721 Acute metabolic acidosis: Secondary | ICD-10-CM | POA: Diagnosis present

## 2023-07-12 DIAGNOSIS — N1831 Chronic kidney disease, stage 3a: Secondary | ICD-10-CM

## 2023-07-12 DIAGNOSIS — J9601 Acute respiratory failure with hypoxia: Secondary | ICD-10-CM | POA: Diagnosis present

## 2023-07-12 DIAGNOSIS — N320 Bladder-neck obstruction: Secondary | ICD-10-CM | POA: Diagnosis present

## 2023-07-12 DIAGNOSIS — Z79899 Other long term (current) drug therapy: Secondary | ICD-10-CM | POA: Diagnosis not present

## 2023-07-12 DIAGNOSIS — I130 Hypertensive heart and chronic kidney disease with heart failure and stage 1 through stage 4 chronic kidney disease, or unspecified chronic kidney disease: Secondary | ICD-10-CM | POA: Diagnosis present

## 2023-07-12 DIAGNOSIS — Z95810 Presence of automatic (implantable) cardiac defibrillator: Secondary | ICD-10-CM | POA: Diagnosis not present

## 2023-07-12 DIAGNOSIS — R197 Diarrhea, unspecified: Secondary | ICD-10-CM | POA: Diagnosis present

## 2023-07-12 DIAGNOSIS — N401 Enlarged prostate with lower urinary tract symptoms: Secondary | ICD-10-CM | POA: Diagnosis present

## 2023-07-12 DIAGNOSIS — E876 Hypokalemia: Secondary | ICD-10-CM | POA: Diagnosis present

## 2023-07-12 DIAGNOSIS — J159 Unspecified bacterial pneumonia: Secondary | ICD-10-CM | POA: Diagnosis present

## 2023-07-12 DIAGNOSIS — I5021 Acute systolic (congestive) heart failure: Secondary | ICD-10-CM

## 2023-07-12 DIAGNOSIS — D631 Anemia in chronic kidney disease: Secondary | ICD-10-CM | POA: Diagnosis present

## 2023-07-12 DIAGNOSIS — J449 Chronic obstructive pulmonary disease, unspecified: Secondary | ICD-10-CM | POA: Diagnosis present

## 2023-07-12 DIAGNOSIS — N400 Enlarged prostate without lower urinary tract symptoms: Secondary | ICD-10-CM | POA: Diagnosis present

## 2023-07-12 DIAGNOSIS — J189 Pneumonia, unspecified organism: Secondary | ICD-10-CM | POA: Diagnosis present

## 2023-07-12 DIAGNOSIS — I5023 Acute on chronic systolic (congestive) heart failure: Secondary | ICD-10-CM | POA: Diagnosis present

## 2023-07-12 DIAGNOSIS — Z7901 Long term (current) use of anticoagulants: Secondary | ICD-10-CM | POA: Diagnosis not present

## 2023-07-12 DIAGNOSIS — N138 Other obstructive and reflux uropathy: Secondary | ICD-10-CM | POA: Diagnosis present

## 2023-07-12 DIAGNOSIS — R9431 Abnormal electrocardiogram [ECG] [EKG]: Secondary | ICD-10-CM | POA: Diagnosis present

## 2023-07-12 DIAGNOSIS — Z87891 Personal history of nicotine dependence: Secondary | ICD-10-CM | POA: Diagnosis not present

## 2023-07-12 DIAGNOSIS — J439 Emphysema, unspecified: Secondary | ICD-10-CM | POA: Diagnosis present

## 2023-07-12 DIAGNOSIS — I2510 Atherosclerotic heart disease of native coronary artery without angina pectoris: Secondary | ICD-10-CM | POA: Diagnosis present

## 2023-07-12 LAB — URINALYSIS, COMPLETE (UACMP) WITH MICROSCOPIC
Bacteria, UA: NONE SEEN
Bilirubin Urine: NEGATIVE
Glucose, UA: >=500 mg/dL — AB
Hgb urine dipstick: NEGATIVE
Ketones, ur: NEGATIVE mg/dL
Leukocytes,Ua: NEGATIVE
Nitrite: NEGATIVE
Protein, ur: NEGATIVE mg/dL
Specific Gravity, Urine: 1.011 (ref 1.005–1.030)
pH: 7 (ref 5.0–8.0)

## 2023-07-12 LAB — COMPREHENSIVE METABOLIC PANEL WITH GFR
ALT: 13 U/L (ref 0–44)
AST: 23 U/L (ref 15–41)
Albumin: 3.5 g/dL (ref 3.5–5.0)
Alkaline Phosphatase: 36 U/L — ABNORMAL LOW (ref 38–126)
Anion gap: 7 (ref 5–15)
BUN: 18 mg/dL (ref 8–23)
CO2: 20 mmol/L — ABNORMAL LOW (ref 22–32)
Calcium: 8.3 mg/dL — ABNORMAL LOW (ref 8.9–10.3)
Chloride: 111 mmol/L (ref 98–111)
Creatinine, Ser: 1.54 mg/dL — ABNORMAL HIGH (ref 0.61–1.24)
GFR, Estimated: 49 mL/min — ABNORMAL LOW (ref >60)
Glucose, Bld: 144 mg/dL — ABNORMAL HIGH (ref 70–99)
Potassium: 3 mmol/L — ABNORMAL LOW (ref 3.5–5.1)
Sodium: 138 mmol/L (ref 135–145)
Total Bilirubin: 2 mg/dL — ABNORMAL HIGH (ref 0.0–1.2)
Total Protein: 6.3 g/dL — ABNORMAL LOW (ref 6.5–8.1)

## 2023-07-12 LAB — PHOSPHORUS
Phosphorus: 1.1 mg/dL — ABNORMAL LOW (ref 2.5–4.6)
Phosphorus: 1.8 mg/dL — ABNORMAL LOW (ref 2.5–4.6)

## 2023-07-12 LAB — BLOOD GAS, VENOUS
Acid-Base Excess: 0.9 mmol/L (ref 0.0–2.0)
Bicarbonate: 23.8 mmol/L (ref 20.0–28.0)
O2 Saturation: 27.6 %
Patient temperature: 37
pCO2, Ven: 32 mmHg — ABNORMAL LOW (ref 44–60)
pH, Ven: 7.48 — ABNORMAL HIGH (ref 7.25–7.43)
pO2, Ven: <31 mmHg — CL (ref 32–45)

## 2023-07-12 LAB — TROPONIN I (HIGH SENSITIVITY)
Troponin I (High Sensitivity): 73 ng/L — ABNORMAL HIGH (ref <18)
Troponin I (High Sensitivity): 87 ng/L — ABNORMAL HIGH (ref <18)

## 2023-07-12 LAB — FERRITIN: Ferritin: 35 ng/mL (ref 24–336)

## 2023-07-12 LAB — MAGNESIUM: Magnesium: 2 mg/dL (ref 1.7–2.4)

## 2023-07-12 LAB — RESP PANEL BY RT-PCR (RSV, FLU A&B, COVID)  RVPGX2
Influenza A by PCR: NEGATIVE
Influenza B by PCR: NEGATIVE
Resp Syncytial Virus by PCR: NEGATIVE
SARS Coronavirus 2 by RT PCR: NEGATIVE

## 2023-07-12 LAB — IRON AND TIBC
Iron: 13 ug/dL — ABNORMAL LOW (ref 45–182)
Saturation Ratios: 3 % — ABNORMAL LOW (ref 17.9–39.5)
TIBC: 433 ug/dL (ref 250–450)
UIBC: 420 ug/dL

## 2023-07-12 LAB — ECHOCARDIOGRAM COMPLETE
Area-P 1/2: 4.86 cm2
Calc EF: 24 %
Est EF: 20
S' Lateral: 6.1 cm
Single Plane A2C EF: 17.9 %
Single Plane A4C EF: 22.7 %

## 2023-07-12 LAB — DIGOXIN LEVEL: Digoxin Level: 0.4 ng/mL — ABNORMAL LOW (ref 0.8–2.0)

## 2023-07-12 LAB — CBC
HCT: 31.5 % — ABNORMAL LOW (ref 39.0–52.0)
Hemoglobin: 9.1 g/dL — ABNORMAL LOW (ref 13.0–17.0)
MCH: 21.3 pg — ABNORMAL LOW (ref 26.0–34.0)
MCHC: 28.9 g/dL — ABNORMAL LOW (ref 30.0–36.0)
MCV: 73.6 fL — ABNORMAL LOW (ref 80.0–100.0)
Platelets: 216 10*3/uL (ref 150–400)
RBC: 4.28 MIL/uL (ref 4.22–5.81)
RDW: 21.2 % — ABNORMAL HIGH (ref 11.5–15.5)
WBC: 10.1 10*3/uL (ref 4.0–10.5)
nRBC: 0 % (ref 0.0–0.2)

## 2023-07-12 LAB — PSA: Prostatic Specific Antigen: 4.22 ng/mL — ABNORMAL HIGH (ref 0.00–4.00)

## 2023-07-12 LAB — CK: Total CK: 193 U/L (ref 49–397)

## 2023-07-12 LAB — PROCALCITONIN: Procalcitonin: <0.1 ng/mL

## 2023-07-12 LAB — TSH: TSH: 2.992 u[IU]/mL (ref 0.350–4.500)

## 2023-07-12 LAB — STREP PNEUMONIAE URINARY ANTIGEN: Strep Pneumo Urinary Antigen: NEGATIVE

## 2023-07-12 MED ORDER — DIGOXIN 125 MCG PO TABS
0.1250 mg | ORAL_TABLET | Freq: Every day | ORAL | Status: DC
  Administered 2023-07-12 – 2023-07-14 (×3): 0.125 mg via ORAL
  Filled 2023-07-12 (×3): qty 1

## 2023-07-12 MED ORDER — SODIUM CHLORIDE 0.9 % IV SOLN: 250.0000 mL | INTRAVENOUS | Status: AC | PRN

## 2023-07-12 MED ORDER — FUROSEMIDE 10 MG/ML IJ SOLN: 80.0000 mg | Freq: Two times a day (BID) | INTRAMUSCULAR | Status: DC

## 2023-07-12 MED ORDER — MEXILETINE HCL 200 MG PO CAPS
200.0000 mg | ORAL_CAPSULE | Freq: Two times a day (BID) | ORAL | Status: DC
  Administered 2023-07-12 – 2023-07-14 (×5): 200 mg via ORAL
  Filled 2023-07-12 (×5): qty 1

## 2023-07-12 MED ORDER — SODIUM CHLORIDE 0.9 % IV SOLN
2.0000 g | Freq: Every day | INTRAVENOUS | Status: DC
Start: 2023-07-12 — End: 2023-07-16
  Administered 2023-07-12 – 2023-07-13 (×3): 2 g via INTRAVENOUS
  Filled 2023-07-12 (×3): qty 20

## 2023-07-12 MED ORDER — SUCRALFATE 1 GM/10ML PO SUSP
1.0000 g | Freq: Three times a day (TID) | ORAL | Status: DC
  Administered 2023-07-12 – 2023-07-14 (×9): 1 g via ORAL
  Filled 2023-07-12 (×9): qty 10

## 2023-07-12 MED ORDER — HYDROCODONE-ACETAMINOPHEN 5-325 MG PO TABS: 1.0000 | ORAL_TABLET | ORAL | Status: DC | PRN

## 2023-07-12 MED ORDER — FENTANYL CITRATE PF 50 MCG/ML IJ SOSY: 12.5000 ug | PREFILLED_SYRINGE | INTRAMUSCULAR | Status: DC | PRN

## 2023-07-12 MED ORDER — LOSARTAN POTASSIUM 25 MG PO TABS
12.5000 mg | ORAL_TABLET | Freq: Every day | ORAL | Status: DC
  Administered 2023-07-12 – 2023-07-14 (×3): 12.5 mg via ORAL
  Filled 2023-07-12: qty 0.5
  Filled 2023-07-12 (×2): qty 1

## 2023-07-12 MED ORDER — POTASSIUM CHLORIDE 20 MEQ PO PACK
40.0000 meq | PACK | Freq: Once | ORAL | Status: AC
  Administered 2023-07-12: 40 meq via ORAL
  Filled 2023-07-12: qty 2

## 2023-07-12 MED ORDER — ACETAMINOPHEN 325 MG PO TABS: 650.0000 mg | ORAL_TABLET | Freq: Four times a day (QID) | ORAL | Status: DC | PRN

## 2023-07-12 MED ORDER — SODIUM CHLORIDE 0.9 % IV SOLN
100.0000 mg | Freq: Two times a day (BID) | INTRAVENOUS | Status: DC
  Administered 2023-07-12: 100 mg via INTRAVENOUS
  Filled 2023-07-12 (×2): qty 100

## 2023-07-12 MED ORDER — SODIUM CHLORIDE 0.9 % IV SOLN
100.0000 mg | Freq: Two times a day (BID) | INTRAVENOUS | Status: DC
  Administered 2023-07-12 (×2): 100 mg via INTRAVENOUS
  Filled 2023-07-12 (×3): qty 100

## 2023-07-12 MED ORDER — FUROSEMIDE 10 MG/ML IJ SOLN
80.0000 mg | Freq: Every evening | INTRAMUSCULAR | Status: AC
  Administered 2023-07-12: 80 mg via INTRAVENOUS
  Filled 2023-07-12: qty 8

## 2023-07-12 MED ORDER — SODIUM CHLORIDE 0.9 % IV SOLN: 500.0000 mg | Freq: Every day | INTRAVENOUS | Status: DC

## 2023-07-12 MED ORDER — FUROSEMIDE 10 MG/ML IJ SOLN
80.0000 mg | Freq: Every day | INTRAMUSCULAR | Status: DC
  Administered 2023-07-12: 80 mg via INTRAVENOUS
  Filled 2023-07-12: qty 8

## 2023-07-12 MED ORDER — ALBUTEROL SULFATE (2.5 MG/3ML) 0.083% IN NEBU: 2.5000 mg | INHALATION_SOLUTION | RESPIRATORY_TRACT | Status: DC | PRN

## 2023-07-12 MED ORDER — FUROSEMIDE 10 MG/ML IJ SOLN
40.0000 mg | Freq: Every day | INTRAMUSCULAR | Status: DC
  Filled 2023-07-12: qty 4

## 2023-07-12 MED ORDER — PERFLUTREN LIPID MICROSPHERE
1.0000 mL | INTRAVENOUS | Status: AC | PRN
  Administered 2023-07-12: 3 mL via INTRAVENOUS

## 2023-07-12 MED ORDER — POTASSIUM PHOSPHATES 15 MMOLE/5ML IV SOLN
30.0000 mmol | Freq: Once | INTRAVENOUS | Status: AC
  Administered 2023-07-12: 30 mmol via INTRAVENOUS
  Filled 2023-07-12: qty 10

## 2023-07-12 MED ORDER — MELATONIN 3 MG PO TABS
6.0000 mg | ORAL_TABLET | Freq: Every evening | ORAL | Status: DC | PRN
  Administered 2023-07-12 – 2023-07-13 (×2): 6 mg via ORAL
  Filled 2023-07-12 (×2): qty 2

## 2023-07-12 MED ORDER — SODIUM CHLORIDE 0.9% FLUSH: 3.0000 mL | INTRAVENOUS | Status: DC | PRN

## 2023-07-12 MED ORDER — APIXABAN 5 MG PO TABS
5.0000 mg | ORAL_TABLET | Freq: Two times a day (BID) | ORAL | Status: DC
  Administered 2023-07-12 – 2023-07-14 (×5): 5 mg via ORAL
  Filled 2023-07-12 (×5): qty 1

## 2023-07-12 MED ORDER — TAMSULOSIN HCL 0.4 MG PO CAPS
0.4000 mg | ORAL_CAPSULE | Freq: Every day | ORAL | Status: DC
  Administered 2023-07-12 – 2023-07-14 (×3): 0.4 mg via ORAL
  Filled 2023-07-12 (×3): qty 1

## 2023-07-12 MED ORDER — PANTOPRAZOLE SODIUM 40 MG PO TBEC
40.0000 mg | DELAYED_RELEASE_TABLET | Freq: Every day | ORAL | Status: DC
  Administered 2023-07-12 – 2023-07-14 (×3): 40 mg via ORAL
  Filled 2023-07-12 (×3): qty 1

## 2023-07-12 MED ORDER — SODIUM CHLORIDE 0.9% FLUSH
3.0000 mL | Freq: Two times a day (BID) | INTRAVENOUS | Status: DC
  Administered 2023-07-12 – 2023-07-14 (×6): 3 mL via INTRAVENOUS

## 2023-07-12 MED ORDER — FENOFIBRATE 54 MG PO TABS
54.0000 mg | ORAL_TABLET | Freq: Every day | ORAL | Status: DC
  Administered 2023-07-12 – 2023-07-14 (×3): 54 mg via ORAL
  Filled 2023-07-12 (×3): qty 1

## 2023-07-12 MED ORDER — ACETAMINOPHEN 650 MG RE SUPP: 650.0000 mg | Freq: Four times a day (QID) | RECTAL | Status: DC | PRN

## 2023-07-12 MED ORDER — SPIRONOLACTONE 12.5 MG HALF TABLET
12.5000 mg | ORAL_TABLET | Freq: Every morning | ORAL | Status: DC
  Administered 2023-07-12 – 2023-07-13 (×2): 12.5 mg via ORAL
  Filled 2023-07-12 (×2): qty 1

## 2023-07-12 NOTE — Assessment & Plan Note (Signed)
Start Flomax

## 2023-07-12 NOTE — Progress Notes (Addendum)
 PROGRESS NOTE    DOC MANDALA  ZOX:[id number] DOB: May 22, 1955 DOA: 07/11/2023 PCP: Madonna Schlein, NP   Brief Narrative:  68 year old male with history of systolic CHF with EF of less than 20% with ICD in place, paroxysmal A-fib, CKD stage IIIb, essential hypertension , COPD, BPH, hyperlipidemia presented with worsening shortness of breath and abdominal pain along with urinary retention and vomiting.  On presentation, he was mildly hypoxic needing BiPAP.  Chest x-ray showed no acute chest disease.  CT of chest/abdomen/pelvis was negative for aortic dissection or aneurysm.  Mild groundglass density within bilateral right greater than left perihilar lungs, due to mild edema or infection along with markedly enlarged prostate.  WBC of 13.9.  I sent troponin 70, BNP 3104.9.  Foley catheter was placed for urinary retention.  He was started on IV antibiotics and Lasix .  Assessment & Plan:   Acute on chronic systolic heart failure with history of ICD Essential hypertension -Last echo had shown EF of less than 20%.  Patient has ICD in place.  Repeat echo has been ordered for this admission. - Strict input and output.  Daily weights.  Fluid restriction.  Continue IV Lasix : Diuretics as per cardiology.  I have consulted cardiology.  Continue spironolactone , losartan , digoxin   Positive troponins - Possibly from above.  No chest pain.  Cardiology following.  Paroxysmal A-fib - Currently rate controlled.  Continue digoxin , Eliquis  and mexiletine.  Acute respiratory failure with hypoxia Possible community-acquired pneumonia -Required BiPAP on presentation.  Currently on 2 to 4 L oxygen via nasal cannula.  Presented with cough with increased sputum production and imaging could not rule out pneumonia.  Continue Rocephin and doxycycline: Finish 5-day course of therapy  Acute urinary retention BPH - Foley catheter placed on admission.  Patient wants it removed.  Will possibly give a voiding trial  tomorrow.  Continue Flomax   CKD stage IIIb Acute metabolic acidosis -Creatinine currently stable.  Monitor  Leukocytosis Resolved  Anemia of chronic disease Microcytosis - No signs of bleeding.  Hemoglobin stable.  Monitor intermittently  Hypokalemia -Replace.  Repeat a.m. labs  Hyperlipidemia -Continue fenofibrate  Prolonged QT interval - Monitor on telemetry.  Avoid QT prolonging medications.  Correct electrolytes  Diarrhea -Questionable cause.  Will need stool testing if requested.  Physical deconditioning - PT eval  DVT prophylaxis: Eliquis  Code Status: Full Family Communication: None at bedside Disposition Plan: Status is: Inpatient Remains inpatient appropriate because: Of severity of illness    Consultants: Cardiology  Procedures: None  Antimicrobials:  Anti-infectives (From admission, onward)    Start     Dose/Rate Route Frequency Ordered Stop   07/12/23 1100  doxycycline (VIBRAMYCIN) 100 mg in sodium chloride  0.9 % 250 mL IVPB        100 mg 125 mL/hr over 120 Minutes Intravenous 2 times daily 07/12/23 1014     07/12/23 0130  doxycycline (VIBRAMYCIN) 100 mg in sodium chloride  0.9 % 250 mL IVPB  Status:  Discontinued        100 mg 125 mL/hr over 120 Minutes Intravenous 2 times daily 07/12/23 0057 07/12/23 1014   07/12/23 0030  cefTRIAXone (ROCEPHIN) 2 g in sodium chloride  0.9 % 100 mL IVPB        2 g 200 mL/hr over 30 Minutes Intravenous Daily at bedtime 07/12/23 0019 07/16/23 2159   07/12/23 0030  azithromycin (ZITHROMAX) 500 mg in sodium chloride  0.9 % 250 mL IVPB  Status:  Discontinued        500  mg 250 mL/hr over 60 Minutes Intravenous Daily at bedtime 07/12/23 0019 07/12/23 0057        Subjective: Patient seen and examined at bedside.  Feels slightly better.  Still short of breath with exertion.  No fever, vomiting, worsening abdominal pain reported.  Objective: Vitals:   07/12/23 0830 07/12/23 0915 07/12/23 0921 07/12/23 0930  BP: 96/80  109/77    Pulse: 86 79  77  Resp: (!) 22 12  15   Temp:   98 F (36.7 C)   TempSrc:   Oral   SpO2: 100% 99%  100%    Intake/Output Summary (Last 24 hours) at 07/12/2023 1008 Last data filed at 07/12/2023 0548 Gross per 24 hour  Intake 350 ml  Output 1300 ml  Net -950 ml   There were no vitals filed for this visit.  Examination:  General exam: Appears calm and comfortable.  On 2 to 4 L oxygen via nasal cannula.  Looks chronically ill and deconditioned. Respiratory system: Bilateral decreased breath sounds at bases with scattered crackles Cardiovascular system: S1 & S2 heard, Rate controlled Gastrointestinal system: Abdomen is nondistended, soft and nontender. Normal bowel sounds heard. Extremities: No cyanosis, clubbing; trace lower extremity edema  Central nervous system: Alert and oriented. No focal neurological deficits. Moving extremities Skin: No rashes, lesions or ulcers Psychiatry: Flat affect.  Not agitated.   Genitourinary: Foley catheter present    Data Reviewed: I have personally reviewed following labs and imaging studies  CBC: Recent Labs  Lab 07/11/23 2138 07/12/23 0434  WBC 13.9* 10.1  NEUTROABS 11.9*  --   HGB 9.4* 9.1*  HCT 32.8* 31.5*  MCV 74.4* 73.6*  PLT 230 216   Basic Metabolic Panel: Recent Labs  Lab 07/11/23 2138 07/12/23 0259 07/12/23 0434  NA 140  --  138  K 3.5  --  3.0*  CL 109  --  111  CO2 19*  --  20*  GLUCOSE 123*  --  144*  BUN 18  --  18  CREATININE 1.40*  --  1.54*  CALCIUM 9.1  --  8.3*  MG 2.1  --  2.0  PHOS  --  1.1* 1.8*   GFR: Estimated Creatinine Clearance: 55.6 mL/min (A) (by C-G formula based on SCr of 1.54 mg/dL (H)). Liver Function Tests: Recent Labs  Lab 07/11/23 2138 07/12/23 0434  AST 25 23  ALT 14 13  ALKPHOS 38 36*  BILITOT 2.0* 2.0*  PROT 6.6 6.3*  ALBUMIN 3.8 3.5   Recent Labs  Lab 07/11/23 2138  LIPASE 26   No results for input(s): "AMMONIA" in the last 168 hours. Coagulation  Profile: Recent Labs  Lab 07/11/23 2138  INR 1.3*   Cardiac Enzymes: Recent Labs  Lab 07/12/23 0259  CKTOTAL 193   BNP (last 3 results) No results for input(s): "PROBNP" in the last 8760 hours. HbA1C: No results for input(s): "HGBA1C" in the last 72 hours. CBG: No results for input(s): "GLUCAP" in the last 168 hours. Lipid Profile: No results for input(s): "CHOL", "HDL", "LDLCALC", "TRIG", "CHOLHDL", "LDLDIRECT" in the last 72 hours. Thyroid  Function Tests: Recent Labs    07/11/23 2138  TSH 2.992   Anemia Panel: No results for input(s): "VITAMINB12", "FOLATE", "FERRITIN", "TIBC", "IRON ", "RETICCTPCT" in the last 72 hours. Sepsis Labs: Recent Labs  Lab 07/12/23 0259  PROCALCITON <0.10    Recent Results (from the past 240 hours)  Culture, blood (routine x 2) Call MD if unable to obtain prior to antibiotics being  given     Status: None (Preliminary result)   Collection Time: 07/12/23 12:19 AM   Specimen: BLOOD  Result Value Ref Range Status   Specimen Description BLOOD SITE NOT SPECIFIED  Final   Special Requests   Final    BOTTLES DRAWN AEROBIC AND ANAEROBIC Blood Culture results may not be optimal due to an inadequate volume of blood received in culture bottles   Culture   Final    NO GROWTH < 12 HOURS Performed at [facility], [address], [place], [place] [address]    Report Status PENDING  Incomplete  Culture, blood (routine x 2) Call MD if unable to obtain prior to antibiotics being given     Status: None (Preliminary result)   Collection Time: 07/12/23  1:48 AM   Specimen: BLOOD  Result Value Ref Range Status   Specimen Description BLOOD SITE NOT SPECIFIED  Final   Special Requests   Final    BOTTLES DRAWN AEROBIC AND ANAEROBIC Blood Culture adequate volume   Culture   Final    NO GROWTH < 12 HOURS Performed at [facility], [address] [place], [place] [address]    Report Status PENDING  Incomplete  Resp panel by RT-PCR (RSV, Flu  A&B, Covid) Peripheral     Status: None   Collection Time: 07/12/23  2:59 AM   Specimen: Peripheral; Nasal Swab  Result Value Ref Range Status   SARS Coronavirus 2 by RT PCR NEGATIVE NEGATIVE Final   Influenza A by PCR NEGATIVE NEGATIVE Final   Influenza B by PCR NEGATIVE NEGATIVE Final    Comment: (NOTE) The Xpert Xpress SARS-CoV-2/FLU/RSV plus assay is intended as an aid in the diagnosis of influenza from Nasopharyngeal swab specimens and should not be used as a sole basis for treatment. Nasal washings and aspirates are unacceptable for Xpert Xpress SARS-CoV-2/FLU/RSV testing.  Fact Sheet for Patients: BloggerCourse.com  Fact Sheet for Healthcare Providers: SeriousBroker.it  This test is not yet approved or cleared by the [place]  FDA and has been authorized for detection and/or diagnosis of SARS-CoV-2 by FDA under an Emergency Use Authorization (EUA). This EUA will remain in effect (meaning this test can be used) for the duration of the COVID-19 declaration under Section 564(b)(1) of the Act, 21 U.S.C. section 360bbb-3(b)(1), unless the authorization is terminated or revoked.     Resp Syncytial Virus by PCR NEGATIVE NEGATIVE Final    Comment: (NOTE) Fact Sheet for Patients: BloggerCourse.com  Fact Sheet for Healthcare Providers: SeriousBroker.it  This test is not yet approved or cleared by the [place]  FDA and has been authorized for detection and/or diagnosis of SARS-CoV-2 by FDA under an Emergency Use Authorization (EUA). This EUA will remain in effect (meaning this test can be used) for the duration of the COVID-19 declaration under Section 564(b)(1) of the Act, 21 U.S.C. section 360bbb-3(b)(1), unless the authorization is terminated or revoked.  Performed at [facility], [address], [place], [place] [address]          Radiology  Studies: CT Angio Chest/Abd/Pel for Dissection W and/or Wo Contrast Result Date: 07/11/2023 CLINICAL DATA:  Shortness of breath lower abdominal pain tachypnea EXAM: CT ANGIOGRAPHY CHEST, ABDOMEN AND PELVIS TECHNIQUE: Non-contrast CT of the chest was initially obtained. Multidetector CT imaging through the chest, abdomen and pelvis was performed using the standard protocol during bolus administration of intravenous contrast. Multiplanar reconstructed images and MIPs were obtained and reviewed to evaluate the vascular  anatomy. RADIATION DOSE REDUCTION: This exam was performed according to the departmental dose-optimization program which includes automated exposure control, adjustment of the mA and/or kV according to patient size and/or use of iterative reconstruction technique. CONTRAST:  OMNIPAQUE  IOHEXOL  350 MG/ML SOLN COMPARISON:  CT 01/05/2023 FINDINGS: CTA CHEST FINDINGS Cardiovascular: Non contrasted images of the chest demonstrate no acute intramural hematoma. Nonaneurysmal aorta. No dissection is seen. Left-sided multi lead cardiac pacing device. Mild cardiomegaly. No pericardial effusion Mediastinum/Nodes: Patent trachea. No thyroid  mass. No suspicious lymph nodes. Esophagus within normal limits Lungs/Pleura: Small right greater than left pleural effusions. Mild emphysema. Mild ground-glass density within the bilateral right greater than left perihilar lungs. Musculoskeletal: Sternum appears intact. No acute osseous abnormality Review of the MIP images confirms the above findings. CTA ABDOMEN AND PELVIS FINDINGS VASCULAR Aorta: Normal caliber aorta without aneurysm, dissection, vasculitis or significant stenosis. Celiac: Patent without evidence of aneurysm, dissection, vasculitis or significant stenosis. SMA: Patent without evidence of aneurysm, dissection, vasculitis or significant stenosis. Renals: Both renal arteries are patent without evidence of aneurysm, dissection, vasculitis, fibromuscular  dysplasia or significant stenosis. IMA: Patent without evidence of aneurysm, dissection, vasculitis or significant stenosis. Inflow: Patent without evidence of aneurysm, dissection, vasculitis or significant stenosis. Veins: Suboptimally assessed Review of the MIP images confirms the above findings. NON-VASCULAR Hepatobiliary: No focal liver abnormality is seen. No gallstones, gallbladder wall thickening, or biliary dilatation. Pancreas: Unremarkable. No pancreatic ductal dilatation or surrounding inflammatory changes. Spleen: Normal in size. Small hyperenhancing focus measuring 5 mm within the anterior spleen, unchanged. Adrenals/Urinary Tract: Adrenal glands are normal. Kidneys show no hydronephrosis. The bladder is diffusely thick walled. Stomach/Bowel: Stomach is nonenlarged. No dilated small bowel. No acute bowel wall thickening. Diverticular disease of the sigmoid colon. Lymphatic: No suspicious lymph nodes Reproductive: Markedly enlarged prostate Other: Negative for pelvic effusion or free air. Small fat containing inguinal hernias. Small fat containing periumbilical hernia. Musculoskeletal: No acute osseous abnormality. Review of the MIP images confirms the above findings. IMPRESSION: 1. Negative for acute aortic dissection or aneurysm. 2. Small right greater than left pleural effusions. Mild ground-glass density within the bilateral right greater than left perihilar lungs, either due to mild edema or infection. Mild cardiomegaly. 3. Markedly enlarged prostate. Diffuse bladder wall thickening likely due to chronic bladder outlet obstruction versus cystitis. 4. Diverticular disease of the sigmoid colon without acute inflammatory process. Electronically Signed   By: Esmeralda Hedge M.D.   On: 07/11/2023 23:19   DG Abd Acute W/Chest Result Date: 07/11/2023 CLINICAL DATA:  Shortness of breath and lower abdominal pain x3 days. EXAM: DG ABDOMEN ACUTE WITH 1 VIEW CHEST COMPARISON:  January 14, 2023 FINDINGS:  Stable multilead AICD positioning is noted. There is no evidence of dilated bowel loops or free intraperitoneal air. No radiopaque calculi or other significant radiographic abnormality is seen. Heart size and mediastinal contours are within normal limits. There is no evidence of an acute infiltrate, pleural effusion or pneumothorax. IMPRESSION: Negative abdominal radiographs.  No acute cardiopulmonary disease. Electronically Signed   By: Virgle Grime M.D.   On: 07/11/2023 22:23        Scheduled Meds:  apixaban   5 mg Oral BID   digoxin   0.125 mg Oral Daily   fenofibrate  54 mg Oral Daily   furosemide   80 mg Intravenous BID   losartan   12.5 mg Oral Daily   mexiletine  200 mg Oral Q12H   pantoprazole   40 mg Oral Daily   potassium chloride   40 mEq  Oral Once   sodium chloride  flush  3 mL Intravenous Q12H   spironolactone   12.5 mg Oral q AM   sucralfate   1 g Oral TID WC & HS   tamsulosin   0.4 mg Oral QPC breakfast   Continuous Infusions:  sodium chloride      cefTRIAXone  (ROCEPHIN )  IV Stopped (07/12/23 0345)   doxycycline  (VIBRAMYCIN ) IV Stopped (07/12/23 0548)   potassium PHOSPHATE IVPB (in mmol)            Audria Leather, MD [facility] 07/12/2023, 10:08 AM

## 2023-07-12 NOTE — Assessment & Plan Note (Signed)
 Strict I's and O's if Evidence of significant retention and postvoid residual elevated will place Foley catheter. Initiate Flomax  0.4 mg daily

## 2023-07-12 NOTE — Assessment & Plan Note (Signed)
 Conitnue eliquis   And digoxin  unless level is elevated

## 2023-07-12 NOTE — Assessment & Plan Note (Signed)
- -  Patient presenting with  productive cough  hypoxia  , and infiltrate in   lower lobe on chest x-ray -Infiltrate on CXR and 2-3 characteristics (fever, leukocytosis, purulent sputum) are consistent with pneumonia. -This appears to be most likely community-acquired pneumonia.        will admit for treatment of CAP will start on appropriate antibiotic coverage. - Rocephin/doxycycline   Obtain:  sputum cultures,                  Obtain respiratory panel                    influenza serologies negative                COVID Pending but unlikely                blood cultures and sputum cultures ordered                   strep pneumo UA antigen,                   check for Legionella antigen.                Provide oxygen as needed.

## 2023-07-12 NOTE — ED Notes (Signed)
 Post bladder scan void showed 375. Pt voided . After talking to Dr.Kommor, he told this RN to place a foley cath in pt.

## 2023-07-12 NOTE — ED Notes (Signed)
 PA Andree Bane at bedside

## 2023-07-12 NOTE — Consult Note (Signed)
 Advanced Heart Failure Team Consult Note   Primary Physician: Austin Schlein, NP Cardiologist:  Austin Oar, MD  Reason for Consultation: Acute on chronic HFrEF  HPI:    Austin [name] is seen today for evaluation of acute on chronic HFrEF at the request of Dr. Maury [name]. 68 y.o. old with a history of chronic HFrEF/NICM s/p single chamber Biotronik, HTN, HLD, nonobstructive CAD 2012, CKD Stage III, PAF, BPH with history of prostate surgery (per patient).   Cath (2015): LCX 20% OMI, otherwise ok.  Echo (2022): EF 25-30% RV normal.  Echo (03/2022): EF 15-20% RV normal.    Admitted to [facility] Jan, Feb, March, April, and May of 2024 with A/C HFrEF.   Admitted 6/24 with a/c HF and AF. Echo showed EF < 20% RV normal. R/LHC showed minimal CAD, severe NICM EF 10%, mildly elevated filling pressures and severely reduced CI 1.9. AF was rate controlled, with frequent PVCs. Chemically converted to NSR on amio. Plan to repeat echo in 4-6 weeks after GDMT and PVC suppression, with eye toward LVAD if EF not improved.   Admitted in 11/24 with cardiogenic shock. Echo 11/24 EF < 20%. Treated with milrinone .  EF Device interrogation showed that he developed AV block in 10/24 and RV pacing went up > 70%. The long AV delay causing frequent paced beats and dropped beats as well. Underwent upgrade to CRT device with RA lead as well    Seen in AF clinic  in 2/25. Back in AF. DC-CV arranged but when he arrived he had accidentally stopped Eliquis  and was very confused about the rest of his medications. DC-CV cancelled. We discussed with his cardiologist at the [facility] (Dr. Marionette [name]) who arranged to see him back and work with him on his meds. Saw Dr. Rodolfo [name] (EP) on 04/18/23. Was still in AF but rates controlled. Was having dark stools and pending GI eval.    Echo 3/25 EF 20-25% RV ok.  In rate controlled AF 3/25, underwent DCCV to NSR 05/19/23.  Seen in HF clinic 05/19. Entresto  stopped d/t hypotension and losartan   started at 12.5 mg daily.   He fell at home about a month ago. Was too weak to stand up and was on the floor most of the night. Unsure if he lost consciousness prior to the fall. Austin [name] once again after that. Reports the falls prompted recent reduction in GDMT. Denies ICD shock.  Presented to the ED yesterday evening with complaints of shortness of breath and abdominal pain X 3 days. Labs significant for BNP 3,104, Cr 1.4, bicarb 19, Na 140, K 3.5, T bili 2, lipase WNL, Hgb 9.4, WBCs 13.9, platelets 230, dig level 0.4, Hs troponin 70>73, procal < 0.1. Blood cultures and UA obtained. He was in respiratory distress and placed on BiPAP. Started on IV lasix  and empiric abx for PNA. CTA C/A/P with no evidence of dissection, small b/l pleural effusions, GG density in b/l perihilar lungs (edema vs infection), markedly enlarged prostate with bladder wall thickening. Admitted for further workup/management.  Had urinary retention and foley cathter was placed.  Advanced Heart Failure asked to see to assist with management of acute on chronic CHF.  Home Medications Prior to Admission medications   Medication Sig Start Date End Date Taking? Authorizing Provider  acetaminophen  (TYLENOL ) 325 MG tablet Take 2 tablets (650 mg total) by mouth every 4 (four) hours as needed for moderate pain (pain score 4-6). 01/14/23  Yes [name], Austin Doss, MD  apixaban  (ELIQUIS )  5 MG TABS tablet Take 1 tablet (5 mg total) by mouth 2 (two) times daily. Start taking on 01/17/23 pm 01/14/23  Yes [name], Austin Doss, MD  digoxin  (LANOXIN ) 0.125 MG tablet Take 1 tablet (0.125 mg total) by mouth daily. 09/06/22  Yes Austin Donna, NP  empagliflozin  (JARDIANCE ) 25 MG TABS tablet Take 12.5 mg by mouth daily.   Yes [provider]  fenofibrate (TRICOR) 48 MG tablet Take 2 tablets by mouth Daily   Yes [provider]  losartan  (COZAAR ) 25 MG tablet Take 0.5 tablets (12.5 mg total) by mouth daily. 07/03/23 10/01/23 Yes  [name], Austin Bene, FNP  melatonin 3 MG TABS tablet Take 6 mg by mouth at bedtime as needed (sleep). 02/03/22  Yes [provider]  mexiletine (MEXITIL ) 200 MG capsule Take 1 capsule (200 mg total) by mouth every 12 (twelve) hours. 01/30/23  Yes [name], Austin R, MD  polyethylene glycol (MIRALAX  / GLYCOLAX ) 17 g packet Take 17 g by mouth daily. Patient taking differently: Take 17 g by mouth daily as needed (constipation.). 01/15/23  Yes [name], Austin Doss, MD  spironolactone  (ALDACTONE ) 25 MG tablet Take 0.5 tablets (12.5 mg total) by mouth in the morning. 04/07/23  Yes [name], Austin Celestine, MD  torsemide  (DEMADEX ) 20 MG tablet Take 1 tablet (20 mg total) by mouth daily. 01/30/23  Yes [name], Austin Celestine, MD  ferrous sulfate  325 (65 FE) MG tablet Take 1 tablet (325 mg total) by mouth every Monday, Wednesday, and Friday. In the morning Patient not taking: Reported on 07/03/2023 04/07/23   [name], Austin Celestine, MD  omeprazole  (PRILOSEC) 20 MG capsule Take 1 capsule (20 mg total) by mouth daily. 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   Single Chamber Biotronik   Myocardial infarction [facility]) 2012   12/31/2013   Nonischemic cardiomyopathy (HCC) 11/15/2010   cath 12/10/10 minimal nonobstructive coronary artery disease   Olecranon bursitis    Squamous cell carcinoma of arm, 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   Systolic heart failure 11/15/2010   echo 12/08/10 EF 20%, severe left  ventricular enlargement, mild right ventricular enlargement    Past Surgical History: Past Surgical History:  Procedure Laterality Date   BIV UPGRADE N/A 01/13/2023   Procedure: BIV UPGRADE;  Surgeon: Verona Goodwill, MD;  Location: [facility] INVASIVE CV LAB;  Service: Cardiovascular;  Laterality: N/A;   CARDIOVERSION N/A 08/26/2022   Procedure: CARDIOVERSION;  Surgeon: Mardell Shade, MD;  Location: [facility] INVASIVE CV LAB;  Service: Cardiovascular;  Laterality: N/A;   CARDIOVERSION N/A 05/19/2023   Procedure: CARDIOVERSION;  Surgeon: Mardell Shade, MD;  Location: [facility] INVASIVE CV LAB;  Service: Cardiovascular;  Laterality: N/A;   HAND SURGERY  02/14/1977   left hand machine trauma   Right elbow mass excisional biopsy Right 12/13/2022   RIGHT/LEFT HEART CATH AND CORONARY ANGIOGRAPHY N/A 08/10/2022   Procedure: RIGHT/LEFT HEART CATH AND CORONARY ANGIOGRAPHY;  Surgeon: Mardell Shade, MD;  Location: [facility] INVASIVE CV LAB;  Service: Cardiovascular;  Laterality: N/A;   TEE WITHOUT CARDIOVERSION N/A 08/26/2022   Procedure: TRANSESOPHAGEAL ECHOCARDIOGRAM;  Surgeon: Mardell Shade, MD;  Location: [facility] INVASIVE CV LAB;  Service: Cardiovascular;  Laterality: N/A;   TRANSURETHRAL RESECTION OF PROSTATE N/A 09/09/2021   UPPER EXTREMITY VENOGRAPHY N/A 01/10/2023   Procedure: UPPER EXTREMITY VENOGRAPHY;  Surgeon: Boyce Byes, MD;  Location: [facility] INVASIVE CV LAB;  Service: Cardiovascular;  Laterality: N/A;    Family History: Family History  Problem Relation Age of Onset   Breast cancer Mother    Cancer Father    Heart attack Brother    Heart attack Brother 60    Social History: Social History   Socioeconomic History   Marital status: Single    Spouse name: Not on file   Number of children: Not on file   Years of education: Not on file   Highest education level: Not on file  Occupational History   Not on file  Tobacco Use   Smoking status: Former    Current packs/day: 0.00     Average packs/day: 2.0 packs/day for 14.0 years (28.0 ttl pk-yrs)    Types: Cigarettes    Start date: 02/15/1979    Quit date: 02/14/1993    Years since quitting: 30.4   Smokeless tobacco: Not on file   Tobacco comments:    Former smoker 03/20/23  Substance and Sexual Activity   Alcohol use: Yes    Alcohol/week: 3.0 standard drinks of alcohol    Types: 1 Cans of beer, 2 Shots of liquor per week    Comment: once a month a beer or couple shots of bourbon 03/20/23   Drug use: Not Currently    Types: Marijuana    Comment: occ smokes marjuana 03/20/23   Sexual activity: Not on file  Other Topics Concern   Not on file  Social History Narrative   He is single and lives in [place].  He drives a refrigerator truck.   Social Drivers of Health   Financial Resource Strain: Patient Declined (03/02/2023)   Received from [facility]   Overall Financial Resource Strain (CARDIA)    Difficulty of Paying Living Expenses: Patient declined  Food Insecurity: Patient Declined (03/02/2023)   Received from [facility]   Hunger Vital Sign    Worried About Running Out of Food in the Last Year: Patient declined    Ran Out of Food in the Last Year: Patient declined  Transportation Needs: Patient Declined (03/02/2023)   Received from [facility] - Transportation    Lack of Transportation (Medical): Patient declined    Lack of Transportation (Non-Medical): Patient declined  Physical Activity: Sufficiently Active (11/01/2022)   Received from [facility]   Exercise Vital Sign    Days of Exercise per Week: 3 days    Minutes of Exercise per Session: 150+ min  Stress: No Stress Concern Present (11/01/2022)   Received from [facility] - Occupational Stress Questionnaire    Feeling of Stress : Not at all  Social Connections: Socially Integrated (11/01/2022)   Received from [facility]   Social Network    How would you rate your social network  (family, work, friends)?: Good participation with social networks    Allergies:  Allergies  Allergen Reactions   Lisinopril  Cough    Objective:    Vital Signs:   Temp:  [98 F (36.7 C)-98.6 F (37 C)] 98.6  F (37 C) (05/28 0350) Pulse Rate:  [61-96] 86 (05/28 0830) Resp:  [15-25] 22 (05/28 0830) BP: (96-142)/(53-105) 96/80 (05/28 0830) SpO2:  [92 %-100 %] 100 % (05/28 0830) FiO2 (%):  [40 %] 40 % (05/28 0041)    Weight change: There were no vitals filed for this visit.  Intake/Output:   Intake/Output Summary (Last 24 hours) at 07/12/2023 0850 Last data filed at 07/12/2023 0548 Gross per 24 hour  Intake 350 ml  Output 1300 ml  Net -950 ml      Physical Exam    General:  Fatigued appearing. Sitting up in bed. Comfortable on 4L [place]. Neck: no JVD Cor: Regular rate & rhythm. No rubs, gallops or murmurs. Lungs: crackles in bases Abdomen: soft, nontender, nondistended.  Extremities: no edema Neuro: alert & orientedx3. Affect pleasant   Telemetry   AV paced 70s, frequent PACs and PVCs  Labs   Basic Metabolic Panel: Recent Labs  Lab 07/11/23 August 12, 2136 07/12/23 0259 07/12/23 0434  NA 140  --  138  K 3.5  --  3.0*  CL 109  --  111  CO2 19*  --  20*  GLUCOSE 123*  --  144*  BUN 18  --  18  CREATININE 1.40*  --  1.54*  CALCIUM 9.1  --  8.3*  MG 2.1  --  2.0  PHOS  --  1.1* 1.8*    Liver Function Tests: Recent Labs  Lab 07/11/23 2136/08/12 07/12/23 0434  AST 25 23  ALT 14 13  ALKPHOS 38 36*  BILITOT 2.0* 2.0*  PROT 6.6 6.3*  ALBUMIN 3.8 3.5   Recent Labs  Lab 07/11/23 12-Aug-2136  LIPASE 26   No results for input(s): "AMMONIA" in the last 168 hours.  CBC: Recent Labs  Lab 07/11/23 08/12/2136 07/12/23 0434  WBC 13.9* 10.1  NEUTROABS 11.9*  --   HGB 9.4* 9.1*  HCT 32.8* 31.5*  MCV 74.4* 73.6*  PLT 230 216    Cardiac Enzymes: Recent Labs  Lab 07/12/23 0259  CKTOTAL 193    BNP: BNP (last 3 results) Recent Labs    05/08/23 1516 07/03/23 1614  07/11/23 Aug 12, 2136  BNP 292.5* 356.0* 3,104.9*    ProBNP (last 3 results) No results for input(s): "PROBNP" in the last 8760 hours.   CBG: No results for input(s): "GLUCAP" in the last 168 hours.  Coagulation Studies: Recent Labs    07/11/23 12-Aug-2136  LABPROT 16.3*  INR 1.3*     Imaging   CT Angio Chest/Abd/Pel for Dissection W and/or Wo Contrast Result Date: 07/11/2023 CLINICAL DATA:  Shortness of breath lower abdominal pain tachypnea EXAM: CT ANGIOGRAPHY CHEST, ABDOMEN AND PELVIS TECHNIQUE: Non-contrast CT of the chest was initially obtained. Multidetector CT imaging through the chest, abdomen and pelvis was performed using the standard protocol during bolus administration of intravenous contrast. Multiplanar reconstructed images and MIPs were obtained and reviewed to evaluate the vascular anatomy. RADIATION DOSE REDUCTION: This exam was performed according to the departmental dose-optimization program which includes automated exposure control, adjustment of the mA and/or kV according to patient size and/or use of iterative reconstruction technique. CONTRAST:  OMNIPAQUE  IOHEXOL  350 MG/ML SOLN COMPARISON:  CT 01/05/2023 FINDINGS: CTA CHEST FINDINGS Cardiovascular: Non contrasted images of the chest demonstrate no acute intramural hematoma. Nonaneurysmal aorta. No dissection is seen. Left-sided multi lead cardiac pacing device. Mild cardiomegaly. No pericardial effusion Mediastinum/Nodes: Patent trachea. No thyroid  mass. No suspicious lymph nodes. Esophagus within normal limits Lungs/Pleura: Small right greater than  left pleural effusions. Mild emphysema. Mild ground-glass density within the bilateral right greater than left perihilar lungs. Musculoskeletal: Sternum appears intact. No acute osseous abnormality Review of the MIP images confirms the above findings. CTA ABDOMEN AND PELVIS FINDINGS VASCULAR Aorta: Normal caliber aorta without aneurysm, dissection, vasculitis or significant stenosis.  Celiac: Patent without evidence of aneurysm, dissection, vasculitis or significant stenosis. SMA: Patent without evidence of aneurysm, dissection, vasculitis or significant stenosis. Renals: Both renal arteries are patent without evidence of aneurysm, dissection, vasculitis, fibromuscular dysplasia or significant stenosis. IMA: Patent without evidence of aneurysm, dissection, vasculitis or significant stenosis. Inflow: Patent without evidence of aneurysm, dissection, vasculitis or significant stenosis. Veins: Suboptimally assessed Review of the MIP images confirms the above findings. NON-VASCULAR Hepatobiliary: No focal liver abnormality is seen. No gallstones, gallbladder wall thickening, or biliary dilatation. Pancreas: Unremarkable. No pancreatic ductal dilatation or surrounding inflammatory changes. Spleen: Normal in size. Small hyperenhancing focus measuring 5 mm within the anterior spleen, unchanged. Adrenals/Urinary Tract: Adrenal glands are normal. Kidneys show no hydronephrosis. The bladder is diffusely thick walled. Stomach/Bowel: Stomach is nonenlarged. No dilated small bowel. No acute bowel wall thickening. Diverticular disease of the sigmoid colon. Lymphatic: No suspicious lymph nodes Reproductive: Markedly enlarged prostate Other: Negative for pelvic effusion or free air. Small fat containing inguinal hernias. Small fat containing periumbilical hernia. Musculoskeletal: No acute osseous abnormality. Review of the MIP images confirms the above findings. IMPRESSION: 1. Negative for acute aortic dissection or aneurysm. 2. Small right greater than left pleural effusions. Mild ground-glass density within the bilateral right greater than left perihilar lungs, either due to mild edema or infection. Mild cardiomegaly. 3. Markedly enlarged prostate. Diffuse bladder wall thickening likely due to chronic bladder outlet obstruction versus cystitis. 4. Diverticular disease of the sigmoid colon without acute  inflammatory process. Electronically Signed   By: Esmeralda Hedge M.D.   On: 07/11/2023 23:19   DG Abd Acute W/Chest Result Date: 07/11/2023 CLINICAL DATA:  Shortness of breath and lower abdominal pain x3 days. EXAM: DG ABDOMEN ACUTE WITH 1 VIEW CHEST COMPARISON:  January 14, 2023 FINDINGS: Stable multilead AICD positioning is noted. There is no evidence of dilated bowel loops or free intraperitoneal air. No radiopaque calculi or other significant radiographic abnormality is seen. Heart size and mediastinal contours are within normal limits. There is no evidence of an acute infiltrate, pleural effusion or pneumothorax. IMPRESSION: Negative abdominal radiographs.  No acute cardiopulmonary disease. Electronically Signed   By: Virgle Grime M.D.   On: 07/11/2023 22:23     Medications:     Current Medications:  apixaban   5 mg Oral BID   digoxin   0.125 mg Oral Daily   fenofibrate  54 mg Oral Daily   furosemide   40 mg Intravenous Daily   losartan   12.5 mg Oral Daily   pantoprazole   40 mg Oral Daily   sodium chloride  flush  3 mL Intravenous Q12H   spironolactone   12.5 mg Oral q AM   sucralfate  1 g Oral TID WC & HS    Infusions:  sodium chloride      cefTRIAXone (ROCEPHIN)  IV Stopped (07/12/23 0345)   doxycycline (VIBRAMYCIN) IV Stopped (07/12/23 0548)      Patient Profile   68 y.o. male with history of HFrEF/NICm s/p CRT-D, PAF, CKD III. Admitted with acute respiratory failure 2/2 acute on chronic HFrEF/possible PNA and abdominal pain.  Assessment/Plan   1. Acute on chronic HFrEF - NICM  - Initially diagnosed with HFrEF in 2012. EF previously  improved to 50-55% on medications.  - Echo (6/24): EF now < 20%. He does have single chamber Biotronik.  - R/LHC (7/24) w/ minimal CAD, mildly elevated filling pressures and severely reduced CO/CI by thermo 4.3/1.9.  - Echo 11/24 EF < 20% -> shock - ICD interrogated 11/24 ->he developed AV block in 10/24 and RV pacing went up > 70%  reculting in SR with long AV delay causing frequent paced beats and dropped beats as well.  - CRT upgrade 11/24 with a placement of an atrial lead  - Echo 05/08/23 LV markedly dilated EF 20-25% RV ok  - NYHA IV on presentation. No JVD on exam but has crackles on exam and pulmonary edema on CT. Give IV lasix  80 BID today then switch to PO tomorrow.  - ? If urinary retention contributing to volume overload. Put out over a liter of urine after foley placed.  - Continue losartan  12.5 mg daily. Off losartan  d/t low blood pressure - Continue torsemide  20 mg daily  - Stop jardiance  with urinary retention/need for foley - Continue digoxin  0.125 mg daily. Dig level okay - Continue spironolactone  12.5 mg daily  - Off b-blocker with hx low output  - ICD interrogated in ED. Reviewed with device rep. 94% BiV paced, frequent PACs and PVCs, episode of Afib about 6 hrs in duration on 05/25, no VT/VF  2. Acute respiratory failure - Suspect 2/2 the above. ? How much urinary retention playing a role in volume overload. - primary team treating empirically for possible CAP.  - Off BiPAP. Comfortable on 4L O2 [place]   3. Paroxysmal A fib  - s/p DCCV to NSR 4/25 - Recurrent AF ~ 6 hrs on 05/25. No recurrence since. - Continue Eliquis  5 mg bid   4. CKD Stage IIIa - Baseline SCr 1.2-1.4.  - Stop SGLT2i with urinary retention - Labs today   5. ?RV Lead vegetation - possible that this is due to the "curl-i-cue" that the lead forms in the right heart, as noted on CXR.  - No vegetation on TEE 7/24   6. PVCs - High PVC burden. ~ 50% during prior admission.  - Continue mexilitene 200 bid.  - Follows with EP  7. Microcytic anemia - Hgb 9s-10s - Check iron  stores  8. Urinary retention - Known BPH. Reports history of prostate surgery.  - Has foley - Start flomax  - Prostate markedly enlarged on CTA. Check PSA.  9. Abdominal pain - May be d/t urinary retention. Resolved after foley placed. - Lipase okay -  No acute findings on Xray and CTA   Length of Stay: 0  Laurajean Hosek N, PA-C  07/12/2023, 8:50 AM    Advanced Heart Failure Team Pager [phone] (M-F; 7a - 5p)  Please contact [facility] for night-coverage after hours (4p -7a ) and weekends on [email or web address]

## 2023-07-12 NOTE — Assessment & Plan Note (Signed)
 Continue Losartan  12.5 po daily

## 2023-07-12 NOTE — Plan of Care (Signed)

## 2023-07-12 NOTE — Progress Notes (Signed)
 Physical Therapy Brief Evaluation and Discharge Note Patient Details Name: Austin [name] MRN: [id number] DOB: 06-22-1955 Today's Date: 07/12/2023   History of Present Illness  Pt is 68 year old presented to [facility]  07/11/23 for SOB. Pt with acute on chronic heart failurePMH - chf, anemia, bph, copd, ICD, htn, afib, turp  Clinical Impression  Pt doing well with mobility and no further PT needed.  Ready for dc from PT standpoint.        PT Assessment Patient does not need any further PT services  Assistance Needed at Discharge  None    Equipment Recommendations None recommended by PT  Recommendations for Other Services       Precautions/Restrictions Precautions Precautions: Fall Restrictions Weight Bearing Restrictions Per Provider Order: No        Mobility  Bed Mobility          Transfers                   General transfer comment: Sitting up    Ambulation/Gait Ambulation/Gait assistance: Supervision Gait Distance (Feet): 200 Feet Assistive device: None Gait Pattern/deviations: Step-through pattern, Drifts right/left Gait Speed: Pace WFL General Gait Details: supervision for lines only. Occasionally scuffs rt foot but able to self correct  Home Activity Instructions    Stairs            Modified Rankin (Stroke Patients Only)        Balance Overall balance assessment: Mild deficits observed, not formally tested, History of Falls             Standing balance comment: Pt able to pick up object off floor and make quick turns without loss of balance          Pertinent Vitals/Pain PT - Brief Vital Signs All Vital Signs Stable: Yes (SpO2 90's on RA) Pain Assessment Pain Assessment: No/denies pain     Home Living Family/patient expects to be discharged to:: Private residence Living Arrangements: Alone   Home Environment: Level entry   Home Equipment: None   Additional Comments: 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    Prior Function Level of Independence: Independent      UE/LE Assessment   UE ROM/Strength/Tone/Coordination: WFL    LE ROM/Strength/Tone/Coordination: [facility]      Communication   Communication Communication: No apparent difficulties     Cognition Overall Cognitive Status: Appears within functional limits for tasks assessed/performed       General Comments General comments (skin integrity, edema, etc.): SpO2 in the 90's on RA at rest and with amb    Exercises     Assessment/Plan    PT Problem List         PT Visit Diagnosis History of falling (Z91.81)    No Skilled PT Patient at baseline level of functioning   Co-evaluation                AMPAC 6 Clicks Help needed turning from your back to your side while in a flat bed without using bedrails?: None Help needed moving from lying on your back to sitting on the side of a flat bed without using bedrails?: None Help needed moving to and from a bed to a chair (including a wheelchair)?: None Help needed standing up from a chair using your arms (e.g., wheelchair or bedside chair)?: None Help needed to walk in hospital room?: A Little Help needed climbing 3-5 steps  with a railing? : None 6 Click Score: 23      End of Session   Activity Tolerance: Patient tolerated treatment well Patient left: in chair;with call bell/phone within reach Nurse Communication: Mobility status;Other (comment) (left O2 off) PT Visit Diagnosis: History of falling (Z91.81)     Time: 1142-1153 PT Time Calculation (min) (ACUTE ONLY): 11 min  Charges:   PT Evaluation $PT Eval Low Complexity: 1 Low      [facility] PT Acute Rehabilitation Services Office [phone]   Pura Browns [facility]  07/12/2023, 12:29 PM

## 2023-07-12 NOTE — Progress Notes (Signed)
 Heart Failure Navigator Progress Note  Assessed for Heart & Vascular TOC clinic readiness.  Patient does not meet criteria due to Advanced Heart Failure Team patient of Dr. [name]. .   Navigator will sign off at this time.   Rhae Hammock, BSN, Scientist, clinical (histocompatibility and immunogenetics) Only

## 2023-07-12 NOTE — Assessment & Plan Note (Addendum)
-   Pt diagnosed with CHF based on presence of the following:  PND,  , rales on exam,  cardiomegaly, Pulmonary edema on CXR, and    pleural effusion  With noted response to IV diuretic in ER  admit on telemetry,  cycle cardiac enzymes, Cardiac Panel (last 3 results) Recent Labs    07/11/23 2138  TROPONINIHS 70*     obtain serial ECG  to evaluate for ischemia as a cause of heart failure  Last BNP BNP (last 3 results) Recent Labs    05/08/23 1516 07/03/23 1614 07/11/23 2138  BNP 292.5* 356.0* 3,104.9*     diurese with IV lasix    40 mg IV q day    and monitor orthostatics and creatinine to avoid over diuresis.  Order echogram to evaluate EF and valves  ACE/ARBi ordered    cardiology  emailed

## 2023-07-12 NOTE — Assessment & Plan Note (Signed)
 Isolated , if recurs can order gastric panel

## 2023-07-12 NOTE — Assessment & Plan Note (Signed)
Obtain anemia panel  Transfuse for Hg <8 , rapidly dropping or  if symptomatic

## 2023-07-12 NOTE — ED Notes (Signed)
 Echo at bedside

## 2023-07-12 NOTE — Progress Notes (Signed)
   07/12/23 0041  BiPAP/CPAP/SIPAP  $ Non-Invasive Ventilator  Non-Invasive Vent Initial  $ Face Mask Medium Yes  BiPAP/CPAP/SIPAP Pt Type Adult  BiPAP/CPAP/SIPAP SERVO  Mask Type Full face mask  Mask Size Medium  Set Rate 15 breaths/min  IPAP 13 cmH20  EPAP 5 cmH2O  FiO2 (%) 40 %  Minute Ventilation 28  Leak 45  Tidal Volume (Vt) 978  Patient Home Machine No  Patient Home Mask No  Patient Home Tubing No  Auto Titrate No  Device Plugged into RED Power Outlet Yes  BiPAP/CPAP /SiPAP Vitals  Pulse Rate 80  Resp 18  SpO2 99 %  Bilateral Breath Sounds Rhonchi  MEWS Score/Color  MEWS Score 0  MEWS Score Color Marrie Sizer

## 2023-07-12 NOTE — Assessment & Plan Note (Signed)
 Chronic stable.

## 2023-07-12 NOTE — ED Notes (Signed)
 Patient ambulating In the hall with OT and PT.

## 2023-07-12 NOTE — TOC Initial Note (Signed)
 Transition of Care [facility]) - Initial/Assessment Note    Patient Details  Name: Austin [name] MRN: [id number] Date of Birth: [birth year]  [facility] CM/SW Contact:    Ernst Heap Phone Number: [phone] 07/12/2023, 3:19 PM  Clinical Narrative:   HF CSW met with patient at bedside. Patient stated that he lives alone. 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 Patient stated that he is still driving. Patient stated that he has no history of HH services. Patient stated that he does not use any equipment at this time. Patient stated that he has a PCP. CSW will schedule patient a hospital follow up appointment closer towards dc. Patient agrees.  TOC will continue following.                 Expected Discharge Plan: Home/Self Care Barriers to Discharge: Continued Medical Work up   Patient Goals and CMS Choice Patient states their goals for this hospitalization and ongoing recovery are:: remian healthy          Expected Discharge Plan and Services       Living arrangements for the past 2 months: Apartment                                      Prior Living Arrangements/Services Living arrangements for the past 2 months: Apartment Lives with:: Self, Pets Patient language and need for interpreter reviewed:: Yes Do you feel safe going back to the place where you live?: Yes      Need for Family Participation in Patient Care: No (Comment) Care giver support system in place?: No (comment)   Criminal Activity/Legal Involvement Pertinent to Current Situation/Hospitalization: No - Comment as needed  Activities of Daily Living   ADL Screening (condition at time of admission) Independently performs ADLs?: Yes (appropriate for developmental age) Is the patient deaf or have difficulty hearing?: Yes Does the patient have difficulty seeing, even when wearing glasses/contacts?: No Does the patient have difficulty concentrating,  remembering, or making decisions?: No  Permission Sought/Granted                  Emotional Assessment Appearance:: Appears stated age Attitude/Demeanor/Rapport: Engaged Affect (typically observed): Appropriate Orientation: : Oriented to Self, Oriented to Place, Oriented to  Time, Oriented to Situation Alcohol / Substance Use: Not Applicable Psych Involvement: No (comment)  Admission diagnosis:  Hypoxia [R09.02] Elevated troponin [R79.89] Bladder outlet obstruction [N32.0] Acute on chronic systolic (congestive) heart failure (HCC) [I50.23] Acute on chronic congestive heart failure, unspecified heart failure type (HCC) [I50.9] Patient Active Problem List   Diagnosis Date Noted   Acute on chronic systolic (congestive) heart failure (HCC) 07/12/2023   Acute urinary retention 07/12/2023   COPD (chronic obstructive pulmonary disease) (HCC) 07/12/2023   BPH (benign prostatic hyperplasia) 07/12/2023   Acute respiratory failure with hypoxia (HCC) 07/12/2023   CAP (community acquired pneumonia) 07/12/2023   Prolonged QT interval 07/12/2023   Diarrhea 07/12/2023   Biventricular ICD (implantable cardioverter-defibrillator) in place 04/17/2023   Hypercoagulable state due to persistent atrial fibrillation (HCC) 03/20/2023   Dyslipidemia 01/11/2023   CKD stage 3b, GFR 30-44 ml/min (HCC) 01/09/2023   Constipation 01/06/2023   Acute CHF (congestive heart failure) (HCC) 01/05/2023   Hyperglycemia 08/11/2022   Malnutrition of moderate degree 08/11/2022   Elevated serum creatinine 08/08/2022   HFrEF (heart failure with reduced ejection fraction) (HCC)  08/08/2022   Acute exacerbation of CHF (congestive heart failure) (HCC) 08/07/2022   Anemia 08/07/2022   Atrial fibrillation (HCC) 08/07/2022   Essential hypertension 03/28/2012   Acute on chronic systolic CHF (congestive heart failure) (HCC) 12/17/2010   PCP:  Madonna Schlein, NP Pharmacy:   [facility] [place], [place] -  [address] [place] [address] Phone: [phone] Fax: [phone]  [facility] [address] [place] [address] Phone: [phone] Fax: [phone]  [facility] - [place], [place] - [address] [place] [address] Phone: [phone] Fax: [phone]     Social Drivers of Health (SDOH) Social History: SDOH Screenings   Food Insecurity: No Food Insecurity (07/12/2023)  Housing: Low Risk  (07/12/2023)  Transportation Needs: No Transportation Needs (07/12/2023)  Utilities: Not At Risk (07/12/2023)  Financial Resource Strain: Patient Declined (03/02/2023)   Received from [facility]  Physical Activity: Sufficiently Active (11/01/2022)   Received from [facility]  Social Connections: Socially Isolated (07/12/2023)  Stress: No Stress Concern Present (11/01/2022)   Received from [facility]  Tobacco Use: Medium Risk (07/12/2023)   SDOH Interventions:     Readmission Risk Interventions     No data to display

## 2023-07-12 NOTE — Evaluation (Signed)
 Occupational Therapy Evaluation Patient Details Name: Austin [name] MRN: [id number] DOB: 1955-08-17 Today's Date: 07/12/2023   History of Present Illness   Pt is 68 year old presented to [facility] on  07/11/23 for SOB. Pt with acute on chronic heart failurePMH - chf, anemia, bph, copd, ICD, htn, afib, turp     Clinical Impressions Pt currently independent to modified independent for simulated selfcare tasks and functional transfers without use of an assistive device.  Pt reports fall approximately one week ago at home but it was not due to LOB but instead "blacking out".  Pt lives alone and was driving and completing all ADLs/ IADLs independently.  Oxygen sats at 97% or greater on room air with HR in the 70s.  BP supine with HOB up at 127/84.  Pt with no current OT needs or follow-up recommended at this time, feel like he at baseline level of function.        Functional Status Assessment   Patient has not had a recent decline in their functional status     Equipment Recommendations   None recommended by OT      Precautions/Restrictions   Precautions Precautions: Fall Restrictions Weight Bearing Restrictions Per Provider Order: No     Mobility Bed Mobility Overal bed mobility: Independent                  Transfers Overall transfer level: Modified independent Equipment used: None               General transfer comment: Pt with occasional veering to the left during mobility without use of an assistive device but no LOB.      Balance Overall balance assessment: Mild deficits observed, not formally tested                                         ADL either performed or assessed with clinical judgement   ADL Overall ADL's : Modified independent                                       General ADL Comments: Pt overall modified independent level for simulated selfcare tasks and functional mobility.  Oxygen sats at 98-100% on  room air with HR in the mid to upper 70s.  BP supine with HOB up at 127/84 and 137/103.  Feel pt is close to functional baseline and has no OT needs at this time.     Vision Baseline Vision/History: 0 No visual deficits Ability to See in Adequate Light: 0 Adequate Patient Visual Report: No change from baseline       Perception Perception: Not tested       Praxis Praxis: Not tested       Pertinent Vitals/Pain Pain Assessment Pain Assessment: No/denies pain     Extremity/Trunk Assessment Upper Extremity Assessment Upper Extremity Assessment: Overall WFL for tasks assessed   Lower Extremity Assessment Lower Extremity Assessment: Defer to PT evaluation   Cervical / Trunk Assessment Cervical / Trunk Assessment: Normal   Communication Communication Communication: No apparent difficulties   Cognition Arousal: Alert Behavior During Therapy: WFL for tasks assessed/performed Cognition: No apparent impairments  Following commands: Intact       Cueing  General Comments      SpO2 in the 90's on RA at rest and with amb           Home Living Family/patient expects to be discharged to:: Private residence Living Arrangements: Alone   Type of Home: Apartment Home Access: Level entry     Home Layout: One level     Bathroom Shower/Tub: Tub/shower unit;Curtain   Firefighter: Standard Bathroom Accessibility: Yes   Home Equipment: None   Additional Comments: 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      Prior Functioning/Environment Prior Level of Function : Independent/Modified Independent             Mobility Comments: no AD use                      AM-PAC OT "6 Clicks" Daily Activity     Outcome Measure Help from another person eating meals?: None Help from another person taking care of personal grooming?: None Help from another person toileting, which includes using  toliet, bedpan, or urinal?: None Help from another person bathing (including washing, rinsing, drying)?: None Help from another person to put on and taking off regular upper body clothing?: None Help from another person to put on and taking off regular lower body clothing?: None 6 Click Score: 24   End of Session Nurse Communication: Mobility status  Activity Tolerance: Patient tolerated treatment well Patient left: in chair;with call bell/phone within reach                   Time: 1124-1142 OT Time Calculation (min): 18 min Charges:  OT General Charges $OT Visit: 1 Visit OT Evaluation $OT Eval Moderate Complexity: 1 Mod  Ardena Becker, OTR/L Acute Rehabilitation Services  Office [phone] 07/12/2023

## 2023-07-12 NOTE — ED Notes (Signed)
 Pacemaker interrogated; [employer] rep notified

## 2023-07-12 NOTE — Assessment & Plan Note (Signed)
-  will monitor on tele avoid QT prolonging medications, rehydrate correct electrolytes ? ?

## 2023-07-12 NOTE — ED Notes (Signed)
 Cardiology at bedside.

## 2023-07-12 NOTE — Assessment & Plan Note (Signed)
this patient has acute respiratory failure with Hypoxia   as documented by the presence of following: O2 saturatio< 90% on RA  Likely due to:  CHF exacerbation, Provide O2 therapy and titrate as needed  Continuous pulse ox   check Pulse ox with ambulation prior to discharge   may need  TC consult for home O2 set up    flutter valve ordered

## 2023-07-12 NOTE — ED Notes (Signed)
 Patient unable to tolerate BiPap. Patient taken off BiPap and placed on 2L [place]. Patient SpO2 99%, RR 18. Patient denies shortness of breath at this time. Provider aware. Patient in bed with HOB raised. Wheels locked. Call bell within reach.

## 2023-07-12 NOTE — Progress Notes (Signed)
  Echocardiogram 2D Echocardiogram has been performed.  Fain Home RDCS 07/12/2023, 11:05 AM

## 2023-07-12 NOTE — Assessment & Plan Note (Signed)
-  chronic avoid nephrotoxic medications such as NSAIDs, Vanco Zosyn combo,  avoid hypotension, continue to follow renal function

## 2023-07-12 NOTE — Assessment & Plan Note (Signed)
 Chronic stable continue home medications ?

## 2023-07-13 DIAGNOSIS — N1831 Chronic kidney disease, stage 3a: Secondary | ICD-10-CM | POA: Diagnosis not present

## 2023-07-13 DIAGNOSIS — I5023 Acute on chronic systolic (congestive) heart failure: Secondary | ICD-10-CM | POA: Diagnosis not present

## 2023-07-13 DIAGNOSIS — J9600 Acute respiratory failure, unspecified whether with hypoxia or hypercapnia: Secondary | ICD-10-CM | POA: Diagnosis not present

## 2023-07-13 LAB — BASIC METABOLIC PANEL WITH GFR
Anion gap: 9 (ref 5–15)
BUN: 20 mg/dL (ref 8–23)
CO2: 26 mmol/L (ref 22–32)
Calcium: 8.9 mg/dL (ref 8.9–10.3)
Chloride: 106 mmol/L (ref 98–111)
Creatinine, Ser: 1.71 mg/dL — ABNORMAL HIGH (ref 0.61–1.24)
GFR, Estimated: 43 mL/min — ABNORMAL LOW (ref >60)
Glucose, Bld: 112 mg/dL — ABNORMAL HIGH (ref 70–99)
Potassium: 3.8 mmol/L (ref 3.5–5.1)
Sodium: 141 mmol/L (ref 135–145)

## 2023-07-13 LAB — CBC
HCT: 35.6 % — ABNORMAL LOW (ref 39.0–52.0)
Hemoglobin: 10.3 g/dL — ABNORMAL LOW (ref 13.0–17.0)
MCH: 21.4 pg — ABNORMAL LOW (ref 26.0–34.0)
MCHC: 28.9 g/dL — ABNORMAL LOW (ref 30.0–36.0)
MCV: 73.9 fL — ABNORMAL LOW (ref 80.0–100.0)
Platelets: 228 10*3/uL (ref 150–400)
RBC: 4.82 MIL/uL (ref 4.22–5.81)
RDW: 21.6 % — ABNORMAL HIGH (ref 11.5–15.5)
WBC: 9.6 10*3/uL (ref 4.0–10.5)
nRBC: 0 % (ref 0.0–0.2)

## 2023-07-13 LAB — PHOSPHORUS: Phosphorus: 2.8 mg/dL (ref 2.5–4.6)

## 2023-07-13 LAB — PSA: Prostatic Specific Antigen: 3.58 ng/mL (ref 0.00–4.00)

## 2023-07-13 LAB — MAGNESIUM: Magnesium: 2.1 mg/dL (ref 1.7–2.4)

## 2023-07-13 MED ORDER — IRON SUCROSE 500 MG IVPB - SIMPLE MED
500.0000 mg | Freq: Once | INTRAVENOUS | Status: DC
  Filled 2023-07-13: qty 275

## 2023-07-13 MED ORDER — DOXYCYCLINE HYCLATE 100 MG PO TABS
100.0000 mg | ORAL_TABLET | Freq: Two times a day (BID) | ORAL | Status: DC
  Administered 2023-07-13 – 2023-07-14 (×3): 100 mg via ORAL
  Filled 2023-07-13 (×3): qty 1

## 2023-07-13 MED ORDER — SPIRONOLACTONE 12.5 MG HALF TABLET
12.5000 mg | ORAL_TABLET | Freq: Once | ORAL | Status: AC
  Administered 2023-07-13: 12.5 mg via ORAL
  Filled 2023-07-13: qty 1

## 2023-07-13 MED ORDER — POTASSIUM CHLORIDE CRYS ER 20 MEQ PO TBCR
40.0000 meq | EXTENDED_RELEASE_TABLET | Freq: Once | ORAL | Status: AC
  Administered 2023-07-13: 40 meq via ORAL
  Filled 2023-07-13: qty 2

## 2023-07-13 MED ORDER — SPIRONOLACTONE 25 MG PO TABS
25.0000 mg | ORAL_TABLET | Freq: Every morning | ORAL | Status: DC
  Administered 2023-07-14: 25 mg via ORAL
  Filled 2023-07-13: qty 1

## 2023-07-13 MED ORDER — ENSURE PLUS HIGH PROTEIN PO LIQD
237.0000 mL | Freq: Two times a day (BID) | ORAL | Status: DC
  Administered 2023-07-13 – 2023-07-14 (×2): 237 mL via ORAL

## 2023-07-13 MED ORDER — CHLORHEXIDINE GLUCONATE CLOTH 2 % EX PADS
6.0000 | MEDICATED_PAD | Freq: Every day | CUTANEOUS | Status: DC
  Administered 2023-07-13: 6 via TOPICAL

## 2023-07-13 MED ORDER — SODIUM CHLORIDE 0.9 % IV SOLN
500.0000 mg | Freq: Once | INTRAVENOUS | Status: AC
  Administered 2023-07-13: 500 mg via INTRAVENOUS
  Filled 2023-07-13: qty 25

## 2023-07-13 MED ORDER — TORSEMIDE 20 MG PO TABS
20.0000 mg | ORAL_TABLET | Freq: Every day | ORAL | Status: DC
  Administered 2023-07-13 – 2023-07-14 (×2): 20 mg via ORAL
  Filled 2023-07-13 (×2): qty 1

## 2023-07-13 NOTE — Progress Notes (Signed)
 PROGRESS NOTE    IANN RODIER  ZOX:[id number] DOB: Aug 31, 1955 DOA: 07/11/2023 PCP: Madonna Schlein, NP   Brief Narrative:  68 year old male with history of systolic CHF with EF of less than 20% with ICD in place, paroxysmal A-fib, CKD stage IIIb, essential hypertension , COPD, BPH, hyperlipidemia presented with worsening shortness of breath and abdominal pain along with urinary retention and vomiting.  On presentation, he was mildly hypoxic needing BiPAP.  Chest x-ray showed no acute chest disease.  CT of chest/abdomen/pelvis was negative for aortic dissection or aneurysm.  Mild groundglass density within bilateral right greater than left perihilar lungs, due to mild edema or infection along with markedly enlarged prostate.  WBC of 13.9.  I sent troponin 70, BNP 3104.9.  Foley catheter was placed for urinary retention.  He was started on IV antibiotics and Lasix .  Assessment & Plan:   Acute on chronic systolic heart failure with history of ICD Essential hypertension -Last echo had shown EF of less than 20%.  Patient has ICD in place.  Repeat echo this admission shows EF of less than 20%. - Strict input and output.  Daily weights.  Fluid restriction.  Diuretics as per heart failure team. Continue spironolactone , losartan , digoxin   Positive troponins - Possibly from above.  No chest pain.  Cardiology following.  Paroxysmal A-fib - Currently rate controlled.  Continue digoxin , Eliquis  and mexiletine.  Acute respiratory failure with hypoxia Possible community-acquired pneumonia -Required BiPAP on presentation.  Presented with cough with increased sputum production and imaging could not rule out pneumonia.  Continue Rocephin and doxycycline: Finish 5-day course of therapy - Respiratory status has improved: Currently on room air  Acute urinary retention BPH - Foley catheter placed on admission.  Patient wants it removed.  Will give voiding trial today.  Continue Flomax   CKD stage  IIIb Acute metabolic acidosis: Resolved -Creatinine currently stable.  Monitor  Leukocytosis -Resolved  Anemia of chronic disease Microcytosis - No signs of bleeding.  Hemoglobin stable.  Monitor intermittently  Hypokalemia - Improved  Hyperlipidemia -Continue fenofibrate  Prolonged QT interval - Monitor on telemetry.  Avoid QT prolonging medications.  Correct electrolytes  Diarrhea -Questionable cause.  Improved.  Will need stool testing if requested.  Physical deconditioning - PT recommended no PT follow-up  DVT prophylaxis: Eliquis  Code Status: Full Family Communication: None at bedside Disposition Plan: Status is: Inpatient Remains inpatient appropriate because: Of severity of illness    Consultants: Advanced heart failure team  Procedures: Echo  Antimicrobials:  Anti-infectives (From admission, onward)    Start     Dose/Rate Route Frequency Ordered Stop   07/13/23 1000  doxycycline (VIBRA-TABS) tablet 100 mg        100 mg Oral Every 12 hours 07/13/23 0741 07/17/23 0959   07/12/23 1100  doxycycline (VIBRAMYCIN) 100 mg in sodium chloride  0.9 % 250 mL IVPB  Status:  Discontinued        100 mg 125 mL/hr over 120 Minutes Intravenous 2 times daily 07/12/23 1014 07/13/23 0741   07/12/23 0130  doxycycline (VIBRAMYCIN) 100 mg in sodium chloride  0.9 % 250 mL IVPB  Status:  Discontinued        100 mg 125 mL/hr over 120 Minutes Intravenous 2 times daily 07/12/23 0057 07/12/23 1014   07/12/23 0030  cefTRIAXone (ROCEPHIN) 2 g in sodium chloride  0.9 % 100 mL IVPB        2 g 200 mL/hr over 30 Minutes Intravenous Daily at bedtime 07/12/23 0019 07/16/23 2159  07/12/23 0030  azithromycin (ZITHROMAX) 500 mg in sodium chloride  0.9 % 250 mL IVPB  Status:  Discontinued        500 mg 250 mL/hr over 60 Minutes Intravenous Daily at bedtime 07/12/23 0019 07/12/23 0057        Subjective: Patient seen and examined at bedside.  Denies worsening shortness of breath or chest  pain.  Feels slightly better but still feels weak.  Wants the Foley catheter removed.  Objective: Vitals:   07/12/23 1652 07/12/23 2145 07/12/23 2333 07/13/23 0446  BP: 125/71 114/83 (!) 98/59 121/78  Pulse:  93 60 74  Resp: 18 19 15 19   Temp: 98.3 F (36.8 C) 97.6 F (36.4 C) 98 F (36.7 C) 97.6 F (36.4 C)  TempSrc: Oral Oral Oral Oral  SpO2:  96% 100% 97%  Weight:    90.3 kg  Height:        Intake/Output Summary (Last 24 hours) at 07/13/2023 0821 Last data filed at 07/13/2023 0446 Gross per 24 hour  Intake 0 ml  Output 7250 ml  Net -7250 ml   Filed Weights   07/12/23 1248 07/13/23 0446  Weight: 93.1 kg 90.3 kg    Examination:  General: On room air.  No distress.  Chronically ill and deconditioned looking. ENT/neck: No thyromegaly.  JVD is not elevated  respiratory: Decreased breath sounds at bases bilaterally with some crackles; no wheezing  CVS: S1-S2 heard, rate controlled currently Abdominal: Soft, nontender, slightly distended; no organomegaly, bowel sounds are heard Extremities: Trace lower extremity edema; no cyanosis  CNS: Awake and alert.  No focal neurologic deficit.  Moves extremities Lymph: No obvious lymphadenopathy Skin: No obvious ecchymosis/lesions  psych: Mostly flat affect.  Not agitated currently. musculoskeletal: No obvious joint swelling/deformity Genitourinary: Foley catheter is present     Data Reviewed: I have personally reviewed following labs and imaging studies  CBC: Recent Labs  Lab 07/11/23 2138 07/12/23 0434 07/13/23 0505  WBC 13.9* 10.1 9.6  NEUTROABS 11.9*  --   --   HGB 9.4* 9.1* 10.3*  HCT 32.8* 31.5* 35.6*  MCV 74.4* 73.6* 73.9*  PLT 230 216 228   Basic Metabolic Panel: Recent Labs  Lab 07/11/23 2138 07/12/23 0259 07/12/23 0434 07/13/23 0505  NA 140  --  138 141  K 3.5  --  3.0* 3.8  CL 109  --  111 106  CO2 19*  --  20* 26  GLUCOSE 123*  --  144* 112*  BUN 18  --  18 20  CREATININE 1.40*  --  1.54* 1.71*   CALCIUM 9.1  --  8.3* 8.9  MG 2.1  --  2.0 2.1  PHOS  --  1.1* 1.8* 2.8   GFR: Estimated Creatinine Clearance: 50.1 mL/min (A) (by C-G formula based on SCr of 1.71 mg/dL (H)). Liver Function Tests: Recent Labs  Lab 07/11/23 2138 07/12/23 0434  AST 25 23  ALT 14 13  ALKPHOS 38 36*  BILITOT 2.0* 2.0*  PROT 6.6 6.3*  ALBUMIN 3.8 3.5   Recent Labs  Lab 07/11/23 2138  LIPASE 26   No results for input(s): "AMMONIA" in the last 168 hours. Coagulation Profile: Recent Labs  Lab 07/11/23 2138  INR 1.3*   Cardiac Enzymes: Recent Labs  Lab 07/12/23 0259  CKTOTAL 193   BNP (last 3 results) No results for input(s): "PROBNP" in the last 8760 hours. HbA1C: No results for input(s): "HGBA1C" in the last 72 hours. CBG: No results for input(s): "GLUCAP"  in the last 168 hours. Lipid Profile: No results for input(s): "CHOL", "HDL", "LDLCALC", "TRIG", "CHOLHDL", "LDLDIRECT" in the last 72 hours. Thyroid  Function Tests: Recent Labs    07/11/23 2138  TSH 2.992   Anemia Panel: Recent Labs    07/12/23 1015  FERRITIN 35  TIBC 433  IRON  13*   Sepsis Labs: Recent Labs  Lab 07/12/23 0259  PROCALCITON <0.10    Recent Results (from the past 240 hours)  Culture, blood (routine x 2) Call MD if unable to obtain prior to antibiotics being given     Status: None (Preliminary result)   Collection Time: 07/12/23 12:19 AM   Specimen: BLOOD  Result Value Ref Range Status   Specimen Description BLOOD SITE NOT SPECIFIED  Final   Special Requests   Final    BOTTLES DRAWN AEROBIC AND ANAEROBIC Blood Culture results may not be optimal due to an inadequate volume of blood received in culture bottles   Culture   Final    NO GROWTH 1 DAY Performed at [facility], [address], [place], [place] [address]    Report Status PENDING  Incomplete  Culture, blood (routine x 2) Call MD if unable to obtain prior to antibiotics being given     Status: None (Preliminary result)    Collection Time: 07/12/23  1:48 AM   Specimen: BLOOD  Result Value Ref Range Status   Specimen Description BLOOD SITE NOT SPECIFIED  Final   Special Requests   Final    BOTTLES DRAWN AEROBIC AND ANAEROBIC Blood Culture adequate volume   Culture   Final    NO GROWTH 1 DAY Performed at [facility], [address], [place], [place] [address]    Report Status PENDING  Incomplete  Resp panel by RT-PCR (RSV, Flu A&B, Covid) Peripheral     Status: None   Collection Time: 07/12/23  2:59 AM   Specimen: Peripheral; Nasal Swab  Result Value Ref Range Status   SARS Coronavirus 2 by RT PCR NEGATIVE NEGATIVE Final   Influenza A by PCR NEGATIVE NEGATIVE Final   Influenza B by PCR NEGATIVE NEGATIVE Final    Comment: (NOTE) The Xpert Xpress SARS-CoV-2/FLU/RSV plus assay is intended as an aid in the diagnosis of influenza from Nasopharyngeal swab specimens and should not be used as a sole basis for treatment. Nasal washings and aspirates are unacceptable for Xpert Xpress SARS-CoV-2/FLU/RSV testing.  Fact Sheet for Patients: BloggerCourse.com  Fact Sheet for Healthcare Providers: SeriousBroker.it  This test is not yet approved or cleared by the [place]  FDA and has been authorized for detection and/or diagnosis of SARS-CoV-2 by FDA under an Emergency Use Authorization (EUA). This EUA will remain in effect (meaning this test can be used) for the duration of the COVID-19 declaration under Section 564(b)(1) of the Act, 21 U.S.C. section 360bbb-3(b)(1), unless the authorization is terminated or revoked.     Resp Syncytial Virus by PCR NEGATIVE NEGATIVE Final    Comment: (NOTE) Fact Sheet for Patients: BloggerCourse.com  Fact Sheet for Healthcare Providers: SeriousBroker.it  This test is not yet approved or cleared by the [place]  FDA and has been authorized for detection and/or  diagnosis of SARS-CoV-2 by FDA under an Emergency Use Authorization (EUA). This EUA will remain in effect (meaning this test can be used) for the duration of the COVID-19 declaration under Section 564(b)(1) of the Act, 21 U.S.C. section 360bbb-3(b)(1), unless the authorization is terminated or revoked.  Performed at [facility], [address] [place], [place] [address]          Radiology Studies: ECHOCARDIOGRAM COMPLETE Result Date: 07/12/2023    ECHOCARDIOGRAM REPORT   Patient Name:   Austin [name] Date of Exam: 07/12/2023 Medical Rec #:  [id number]        Height:       76.0 in Accession #:    [id number]       Weight:       211.8 lb Date of Birth:  1955/09/21         BSA:          2.270 m Patient Age:    67 years         BP:           116/78 mmHg Patient Gender: M                HR:           84 bpm. Exam Location:  Inpatient Procedure: 2D Echo, Color Doppler, Cardiac Doppler and Intracardiac            Opacification Agent (Both Spectral and Color Flow Doppler were            utilized during procedure). Indications:    CHF Acute Systolic I50.21  History:        Patient has prior history of Echocardiogram examinations, most                 recent 01/06/2023.  Sonographer:    Hersey Lorenzo RDCS Referring Phys: [id number] ANASTASSIA DOUTOVA IMPRESSIONS  1. Left ventricular ejection fraction, by estimation, is 20%. The left ventricle has severely decreased function. The left ventricle demonstrates global hypokinesis. The left ventricular internal cavity size was severely dilated. Left ventricular diastolic parameters are consistent with Grade II diastolic dysfunction (pseudonormalization).  2. Increased echogenicity on the device lead as previously noted. Right ventricular systolic function is normal. The right ventricular size is normal. There is normal pulmonary artery systolic pressure.  3. Left atrial size was severely dilated.  4. Right atrial size was mildly dilated.  5. The mitral valve is  normal in structure. Mild mitral valve regurgitation. No evidence of mitral stenosis.  6. The aortic valve is tricuspid. Aortic valve regurgitation is not visualized. No aortic stenosis is present.  7. The inferior vena cava is normal in size with greater than 50% respiratory variability, suggesting right atrial pressure of 3 mmHg. FINDINGS  Left Ventricle: Left ventricular ejection fraction, by estimation, is 20%. The left ventricle has severely decreased function. The left ventricle demonstrates global hypokinesis. The left ventricular internal cavity size was severely dilated. There is no left ventricular hypertrophy. Left ventricular diastolic parameters are consistent with Grade II diastolic dysfunction (pseudonormalization).  LV Wall Scoring: The antero-lateral wall, inferior septum, inferior wall, and posterior wall are akinetic. The entire anterior wall, anterior septum, apical lateral segment, apical inferior segment, and apex are hypokinetic. Right Ventricle: Increased echogenicity on the device lead as previously noted. The right ventricular size is normal. No increase in right ventricular wall thickness. Right ventricular systolic function is normal. There is normal pulmonary artery systolic pressure. The tricuspid regurgitant velocity is 2.55 m/s, and with an assumed right atrial pressure of 3 mmHg, the estimated right ventricular systolic pressure is 29.0 mmHg. Left Atrium: Left atrial size was severely dilated. Right Atrium: Right atrial size was mildly dilated. Pericardium: There is no evidence of pericardial effusion. Mitral Valve: The mitral valve is  normal in structure. Mild mitral valve regurgitation. No evidence of mitral valve stenosis. Tricuspid Valve: The tricuspid valve is normal in structure. Tricuspid valve regurgitation is trivial. No evidence of tricuspid stenosis. Aortic Valve: The aortic valve is tricuspid. Aortic valve regurgitation is not visualized. No aortic stenosis is present.  Pulmonic Valve: The pulmonic valve was normal in structure. Pulmonic valve regurgitation is not visualized. No evidence of pulmonic stenosis. Aorta: The aortic root is normal in size and structure. Venous: The inferior vena cava is normal in size with greater than 50% respiratory variability, suggesting right atrial pressure of 3 mmHg. IAS/Shunts: No atrial level shunt detected by color flow Doppler. Additional Comments: A device lead is visualized.  LEFT VENTRICLE PLAX 2D LVIDd:         8.00 cm LVIDs:         6.10 cm LV PW:         1.10 cm LV IVS:        1.00 cm LVOT diam:     2.30 cm LV SV:         53 LV SV Index:   23 LVOT Area:     4.15 cm  LV Volumes (MOD) LV vol d, MOD A2C: 352.0 ml LV vol d, MOD A4C: 370.0 ml LV vol s, MOD A2C: 289.0 ml LV vol s, MOD A4C: 286.0 ml LV SV MOD A2C:     63.0 ml LV SV MOD A4C:     370.0 ml LV SV MOD BP:      94.5 ml RIGHT VENTRICLE             IVC RV S prime:     12.40 cm/s  IVC diam: 1.60 cm TAPSE (M-mode): 2.1 cm LEFT ATRIUM              Index        RIGHT ATRIUM           Index LA diam:        4.40 cm  1.94 cm/m   RA Area:     21.40 cm LA Vol (A2C):   132.0 ml 58.15 ml/m  RA Volume:   69.70 ml  30.71 ml/m LA Vol (A4C):   88.7 ml  39.08 ml/m LA Biplane Vol: 108.0 ml 47.58 ml/m  AORTIC VALVE LVOT Vmax:   76.60 cm/s LVOT Vmean:  49.000 cm/s LVOT VTI:    0.127 m  AORTA Ao Root diam: 3.50 cm Ao Asc diam:  3.50 cm MITRAL VALVE               TRICUSPID VALVE MV Area (PHT): 4.86 cm    TR Peak grad:   26.0 mmHg MV Decel Time: 156 msec    TR Vmax:        255.00 cm/s MV E velocity: 97.50 cm/s MV A velocity: 73.90 cm/s  SHUNTS MV E/A ratio:  1.32        Systemic VTI:  0.13 m                            Systemic Diam: 2.30 cm Maudine Sos MD Electronically signed by Maudine Sos MD Signature Date/Time: 07/12/2023/2:00:59 PM    Final    CT Angio Chest/Abd/Pel for Dissection W and/or Wo Contrast Result Date: 07/11/2023 CLINICAL DATA:  Shortness of breath lower abdominal pain  tachypnea EXAM: CT ANGIOGRAPHY CHEST, ABDOMEN AND PELVIS TECHNIQUE: Non-contrast CT of the chest was initially obtained.  Multidetector CT imaging through the chest, abdomen and pelvis was performed using the standard protocol during bolus administration of intravenous contrast. Multiplanar reconstructed images and MIPs were obtained and reviewed to evaluate the vascular anatomy. RADIATION DOSE REDUCTION: This exam was performed according to the departmental dose-optimization program which includes automated exposure control, adjustment of the mA and/or kV according to patient size and/or use of iterative reconstruction technique. CONTRAST:  OMNIPAQUE  IOHEXOL  350 MG/ML SOLN COMPARISON:  CT 01/05/2023 FINDINGS: CTA CHEST FINDINGS Cardiovascular: Non contrasted images of the chest demonstrate no acute intramural hematoma. Nonaneurysmal aorta. No dissection is seen. Left-sided multi lead cardiac pacing device. Mild cardiomegaly. No pericardial effusion Mediastinum/Nodes: Patent trachea. No thyroid  mass. No suspicious lymph nodes. Esophagus within normal limits Lungs/Pleura: Small right greater than left pleural effusions. Mild emphysema. Mild ground-glass density within the bilateral right greater than left perihilar lungs. Musculoskeletal: Sternum appears intact. No acute osseous abnormality Review of the MIP images confirms the above findings. CTA ABDOMEN AND PELVIS FINDINGS VASCULAR Aorta: Normal caliber aorta without aneurysm, dissection, vasculitis or significant stenosis. Celiac: Patent without evidence of aneurysm, dissection, vasculitis or significant stenosis. SMA: Patent without evidence of aneurysm, dissection, vasculitis or significant stenosis. Renals: Both renal arteries are patent without evidence of aneurysm, dissection, vasculitis, fibromuscular dysplasia or significant stenosis. IMA: Patent without evidence of aneurysm, dissection, vasculitis or significant stenosis. Inflow: Patent without  evidence of aneurysm, dissection, vasculitis or significant stenosis. Veins: Suboptimally assessed Review of the MIP images confirms the above findings. NON-VASCULAR Hepatobiliary: No focal liver abnormality is seen. No gallstones, gallbladder wall thickening, or biliary dilatation. Pancreas: Unremarkable. No pancreatic ductal dilatation or surrounding inflammatory changes. Spleen: Normal in size. Small hyperenhancing focus measuring 5 mm within the anterior spleen, unchanged. Adrenals/Urinary Tract: Adrenal glands are normal. Kidneys show no hydronephrosis. The bladder is diffusely thick walled. Stomach/Bowel: Stomach is nonenlarged. No dilated small bowel. No acute bowel wall thickening. Diverticular disease of the sigmoid colon. Lymphatic: No suspicious lymph nodes Reproductive: Markedly enlarged prostate Other: Negative for pelvic effusion or free air. Small fat containing inguinal hernias. Small fat containing periumbilical hernia. Musculoskeletal: No acute osseous abnormality. Review of the MIP images confirms the above findings. IMPRESSION: 1. Negative for acute aortic dissection or aneurysm. 2. Small right greater than left pleural effusions. Mild ground-glass density within the bilateral right greater than left perihilar lungs, either due to mild edema or infection. Mild cardiomegaly. 3. Markedly enlarged prostate. Diffuse bladder wall thickening likely due to chronic bladder outlet obstruction versus cystitis. 4. Diverticular disease of the sigmoid colon without acute inflammatory process. Electronically Signed   By: Esmeralda Hedge M.D.   On: 07/11/2023 23:19   DG Abd Acute W/Chest Result Date: 07/11/2023 CLINICAL DATA:  Shortness of breath and lower abdominal pain x3 days. EXAM: DG ABDOMEN ACUTE WITH 1 VIEW CHEST COMPARISON:  January 14, 2023 FINDINGS: Stable multilead AICD positioning is noted. There is no evidence of dilated bowel loops or free intraperitoneal air. No radiopaque calculi or other  significant radiographic abnormality is seen. Heart size and mediastinal contours are within normal limits. There is no evidence of an acute infiltrate, pleural effusion or pneumothorax. IMPRESSION: Negative abdominal radiographs.  No acute cardiopulmonary disease. Electronically Signed   By: Virgle Grime M.D.   On: 07/11/2023 22:23        Scheduled Meds:  apixaban   5 mg Oral BID   Chlorhexidine  Gluconate Cloth  6 each Topical Q0600   digoxin   0.125 mg Oral Daily   doxycycline  100 mg Oral Q12H   fenofibrate  54 mg Oral Daily   losartan   12.5 mg Oral Daily   mexiletine  200 mg Oral Q12H   pantoprazole   40 mg Oral Daily   potassium chloride   40 mEq Oral Once   sodium chloride  flush  3 mL Intravenous Q12H   spironolactone   12.5 mg Oral q AM   sucralfate  1 g Oral TID WC & HS   tamsulosin   0.4 mg Oral QPC breakfast   Continuous Infusions:  cefTRIAXone (ROCEPHIN)  IV 2 g (07/12/23 2155)          Audria Leather, MD [facility] 07/13/2023, 8:21 AM

## 2023-07-13 NOTE — Progress Notes (Addendum)
 Advanced Heart Failure Rounding Note  HF Cardiologist: Jules Oar, MD   Chief Complaint: Acute on chronic HFrEF  Subjective:    7.3L UOP last 24 hrs with IV lasix . Weight down 6 lb.  Feels great. No dyspnea, orthopnea, PND or recurrent abdominal pain.   Objective:   Weight Range: 90.3 kg Body mass index is 24.23 kg/m.   Vital Signs:   Temp:  [97.6 F (36.4 C)-98.3 F (36.8 C)] 97.6 F (36.4 C) (05/29 0446) Pulse Rate:  [60-93] 74 (05/29 0446) Resp:  [12-26] 19 (05/29 0446) BP: (96-127)/(59-84) 121/78 (05/29 0446) SpO2:  [96 %-100 %] 97 % (05/29 0446) Weight:  [90.3 kg-93.1 kg] 90.3 kg (05/29 0446) Last BM Date : 07/11/23 (per pt yesterday evening)  Weight change: Filed Weights   07/12/23 1248 07/13/23 0446  Weight: 93.1 kg 90.3 kg    Intake/Output:   Intake/Output Summary (Last 24 hours) at 07/13/2023 0750 Last data filed at 07/13/2023 0446 Gross per 24 hour  Intake 0 ml  Output 7250 ml  Net -7250 ml      Physical Exam    General:  Well appearing.  Neck: No JVD Cor: Regular rate & rhythm. No rubs, gallops or murmurs. Lungs: Clear Abdomen: Soft, nontender, nondistended.  Extremities: No edema Neuro: Alert & orientedx3. Affect pleasant   Telemetry   V paced 70s, ~ 5 PVCs/min  Labs    CBC Recent Labs    07/11/23 2138 07/12/23 0434 07/13/23 0505  WBC 13.9* 10.1 9.6  NEUTROABS 11.9*  --   --   HGB 9.4* 9.1* 10.3*  HCT 32.8* 31.5* 35.6*  MCV 74.4* 73.6* 73.9*  PLT 230 216 228   Basic Metabolic Panel Recent Labs    95/28/41 0434 07/13/23 0505  NA 138 141  K 3.0* 3.8  CL 111 106  CO2 20* 26  GLUCOSE 144* 112*  BUN 18 20  CREATININE 1.54* 1.71*  CALCIUM 8.3* 8.9  MG 2.0 2.1  PHOS 1.8* 2.8   Liver Function Tests Recent Labs    07/11/23 2138 07/12/23 0434  AST 25 23  ALT 14 13  ALKPHOS 38 36*  BILITOT 2.0* 2.0*  PROT 6.6 6.3*  ALBUMIN 3.8 3.5   Recent Labs    07/11/23 2138  LIPASE 26   Cardiac  Enzymes Recent Labs    07/12/23 0259  CKTOTAL 193    BNP: BNP (last 3 results) Recent Labs    05/08/23 1516 07/03/23 1614 07/11/23 2138  BNP 292.5* 356.0* 3,104.9*    ProBNP (last 3 results) No results for input(s): "PROBNP" in the last 8760 hours.   D-Dimer No results for input(s): "DDIMER" in the last 72 hours. Hemoglobin A1C No results for input(s): "HGBA1C" in the last 72 hours. Fasting Lipid Panel No results for input(s): "CHOL", "HDL", "LDLCALC", "TRIG", "CHOLHDL", "LDLDIRECT" in the last 72 hours. Thyroid  Function Tests Recent Labs    07/11/23 2138  TSH 2.992    Other results:   Imaging    ECHOCARDIOGRAM COMPLETE Result Date: 07/12/2023    ECHOCARDIOGRAM REPORT   Patient Name:   Austin [name] Date of Exam: 07/12/2023 Medical Rec #:  [id number]        Height:       76.0 in Accession #:    [id number]       Weight:       211.8 lb Date of Birth:  24-May-1955         BSA:  2.270 m Patient Age:    68 years         BP:           116/78 mmHg Patient Gender: M                HR:           84 bpm. Exam Location:  Inpatient Procedure: 2D Echo, Color Doppler, Cardiac Doppler and Intracardiac            Opacification Agent (Both Spectral and Color Flow Doppler were            utilized during procedure). Indications:    CHF Acute Systolic I50.21  History:        Patient has prior history of Echocardiogram examinations, most                 recent 01/06/2023.  Sonographer:    Hersey Lorenzo RDCS Referring Phys: [id number] ANASTASSIA DOUTOVA IMPRESSIONS  1. Left ventricular ejection fraction, by estimation, is 20%. The left ventricle has severely decreased function. The left ventricle demonstrates global hypokinesis. The left ventricular internal cavity size was severely dilated. Left ventricular diastolic parameters are consistent with Grade II diastolic dysfunction (pseudonormalization).  2. Increased echogenicity on the device lead as previously noted. Right ventricular systolic  function is normal. The right ventricular size is normal. There is normal pulmonary artery systolic pressure.  3. Left atrial size was severely dilated.  4. Right atrial size was mildly dilated.  5. The mitral valve is normal in structure. Mild mitral valve regurgitation. No evidence of mitral stenosis.  6. The aortic valve is tricuspid. Aortic valve regurgitation is not visualized. No aortic stenosis is present.  7. The inferior vena cava is normal in size with greater than 50% respiratory variability, suggesting right atrial pressure of 3 mmHg. FINDINGS  Left Ventricle: Left ventricular ejection fraction, by estimation, is 20%. The left ventricle has severely decreased function. The left ventricle demonstrates global hypokinesis. The left ventricular internal cavity size was severely dilated. There is no left ventricular hypertrophy. Left ventricular diastolic parameters are consistent with Grade II diastolic dysfunction (pseudonormalization).  LV Wall Scoring: The antero-lateral wall, inferior septum, inferior wall, and posterior wall are akinetic. The entire anterior wall, anterior septum, apical lateral segment, apical inferior segment, and apex are hypokinetic. Right Ventricle: Increased echogenicity on the device lead as previously noted. The right ventricular size is normal. No increase in right ventricular wall thickness. Right ventricular systolic function is normal. There is normal pulmonary artery systolic pressure. The tricuspid regurgitant velocity is 2.55 m/s, and with an assumed right atrial pressure of 3 mmHg, the estimated right ventricular systolic pressure is 29.0 mmHg. Left Atrium: Left atrial size was severely dilated. Right Atrium: Right atrial size was mildly dilated. Pericardium: There is no evidence of pericardial effusion. Mitral Valve: The mitral valve is normal in structure. Mild mitral valve regurgitation. No evidence of mitral valve stenosis. Tricuspid Valve: The tricuspid valve is  normal in structure. Tricuspid valve regurgitation is trivial. No evidence of tricuspid stenosis. Aortic Valve: The aortic valve is tricuspid. Aortic valve regurgitation is not visualized. No aortic stenosis is present. Pulmonic Valve: The pulmonic valve was normal in structure. Pulmonic valve regurgitation is not visualized. No evidence of pulmonic stenosis. Aorta: The aortic root is normal in size and structure. Venous: The inferior vena cava is normal in size with greater than 50% respiratory variability, suggesting right atrial pressure of 3 mmHg. IAS/Shunts: No atrial level shunt  detected by color flow Doppler. Additional Comments: A device lead is visualized.  LEFT VENTRICLE PLAX 2D LVIDd:         8.00 cm LVIDs:         6.10 cm LV PW:         1.10 cm LV IVS:        1.00 cm LVOT diam:     2.30 cm LV SV:         53 LV SV Index:   23 LVOT Area:     4.15 cm  LV Volumes (MOD) LV vol d, MOD A2C: 352.0 ml LV vol d, MOD A4C: 370.0 ml LV vol s, MOD A2C: 289.0 ml LV vol s, MOD A4C: 286.0 ml LV SV MOD A2C:     63.0 ml LV SV MOD A4C:     370.0 ml LV SV MOD BP:      94.5 ml RIGHT VENTRICLE             IVC RV S prime:     12.40 cm/s  IVC diam: 1.60 cm TAPSE (M-mode): 2.1 cm LEFT ATRIUM              Index        RIGHT ATRIUM           Index LA diam:        4.40 cm  1.94 cm/m   RA Area:     21.40 cm LA Vol (A2C):   132.0 ml 58.15 ml/m  RA Volume:   69.70 ml  30.71 ml/m LA Vol (A4C):   88.7 ml  39.08 ml/m LA Biplane Vol: 108.0 ml 47.58 ml/m  AORTIC VALVE LVOT Vmax:   76.60 cm/s LVOT Vmean:  49.000 cm/s LVOT VTI:    0.127 m  AORTA Ao Root diam: 3.50 cm Ao Asc diam:  3.50 cm MITRAL VALVE               TRICUSPID VALVE MV Area (PHT): 4.86 cm    TR Peak grad:   26.0 mmHg MV Decel Time: 156 msec    TR Vmax:        255.00 cm/s MV E velocity: 97.50 cm/s MV A velocity: 73.90 cm/s  SHUNTS MV E/A ratio:  1.32        Systemic VTI:  0.13 m                            Systemic Diam: 2.30 cm Maudine Sos MD Electronically signed  by Maudine Sos MD Signature Date/Time: 07/12/2023/2:00:59 PM    Final      Medications:     Scheduled Medications:  apixaban   5 mg Oral BID   Chlorhexidine  Gluconate Cloth  6 each Topical Q0600   digoxin   0.125 mg Oral Daily   doxycycline  100 mg Oral Q12H   fenofibrate  54 mg Oral Daily   losartan   12.5 mg Oral Daily   mexiletine  200 mg Oral Q12H   pantoprazole   40 mg Oral Daily   potassium chloride   40 mEq Oral Once   sodium chloride  flush  3 mL Intravenous Q12H   spironolactone   12.5 mg Oral q AM   sucralfate  1 g Oral TID WC & HS   tamsulosin   0.4 mg Oral QPC breakfast    Infusions:  cefTRIAXone (ROCEPHIN)  IV 2 g (07/12/23 2155)    PRN Medications: acetaminophen  **OR** acetaminophen , albuterol , fentaNYL  (SUBLIMAZE )  injection, HYDROcodone-acetaminophen , melatonin, sodium chloride  flush    Patient Profile   68 y.o. male with history of HFrEF/NICm s/p CRT-D, PAF, CKD III. Admitted with acute respiratory failure 2/2 acute on chronic HFrEF/possible PNA and abdominal pain.    Assessment/Plan   1. Acute on chronic HFrEF - NICM  - Initially diagnosed with HFrEF in 2012. EF previously improved to 50-55% on medications.  - Echo (6/24): EF now < 20%. He does have single chamber Biotronik.  - R/LHC (7/24) w/ minimal CAD, mildly elevated filling pressures and severely reduced CO/CI by thermo 4.3/1.9.  - Echo 11/24 EF < 20% -> shock - He developed AV block in 10/24 and RV pacing went up > 70% reculting in SR with long AV delay causing frequent paced beats and dropped beats as well.  - CRT upgrade 11/24 with a placement of an atrial lead  - Echo 05/08/23 LV markedly dilated EF 20-25% RV ok  - Echo this admit with EF 20%, RV okay - Has been NYHA II as an outpatient. Readmitted with mild volume overload. ? If urinary retention was contributing. Improved after foley placement. - Excellent diuresis yesterday. Stop IV lasix . Resume home torsemide  20 mg daily - Continue  losartan  12.5 mg daily. Off losartan  d/t low blood pressure - Stopped jardiance  with urinary retention/need for foley - Continue digoxin  0.125 mg daily. Dig level okay - Increase spiro to 25 mg daily - Off b-blocker with hx low output  - ICD interrogated. Reviewed with device rep. 94% BiV paced, frequent PACs and PVCs, episode of Afib about 6 hrs in duration on 05/25, no VT/VF - Worry that he may eventually need advanced therapies, most likely VAD. EF remains severely reduced despite BiV pacing.   2. Acute respiratory failure - Suspect 2/2 the above. ? How much urinary retention playing a role in volume overload. - primary team treating empirically for possible CAP.  - Off BiPAP. Now comfortable on RA   3. Paroxysmal A fib  - s/p DCCV to NSR 4/25 - Recurrent AF ~ 6 hrs on 05/25. No recurrence since. - Continue Eliquis  5 mg bid   4. CKD Stage IIIa - Baseline SCr 1.4-1.7 - Scr up slightly 1.5> 1.7 this am after diuresis but still within baseline - Stopped SGLT2i with urinary retention   5. ?RV Lead vegetation - possible that this is due to the "curl-i-cue" that the lead forms in the right heart, as noted on CXR.  - No vegetation on TEE 7/24   6. PVCs - High PVC burden. ~ 50% during prior admission.  - Seems better suppressed on review of tele - Continue mexilitene 200 bid.  - Follows with EP   7. Microcytic anemia - Hgb 9s-10s - T sat only 3%. Will order IV iron .   8. Urinary retention - Known BPH. Reports history of prostate surgery. Prostate markedly enlarged on CT. - Has foley. Attempting voiding trial today. Recommend outpatient f/u with Urology. - PSA 4.2>3.6 - Started flomax    9. Abdominal pain - Suspect most likely d/t urinary retention. Resolved after foley placed. - Lipase okay - No acute findings on Xray. CTA w/ markedly enlarged prostate and bladder wall thickening as above  Stable for discharge from HF standpoint. Will ensure he has f/u in HF clinic.   HF  meds: Eliquis  5 BID Digoxin  0.125 mg daily Losartan  12.5 mg daily Spironolactone  25 mg daily Mexiletine 200 mg BID Torsemide  20 mg daily Keep off Jardiance  with urinary retention.  Length of Stay: 1  Abeeha Twist N, PA-C  07/13/2023, 7:50 AM  Advanced Heart Failure Team Pager [phone] (M-F; 7a - 5p)  Please contact [facility] for night-coverage after hours (5p -7a ) and weekends on [email or web address]

## 2023-07-13 NOTE — Progress Notes (Signed)
 Patient asleep resting well.  No respiratory distress noted.  BiPAP available if needed.

## 2023-07-13 NOTE — Progress Notes (Signed)
 Mobility Specialist Progress Note;   07/13/23 1050  Mobility  Activity Ambulated independently in hallway  Level of Assistance Standby assist, set-up cues, supervision of patient - no hands on  Assistive Device None  Distance Ambulated (ft) 400 ft  Activity Response Tolerated well  Mobility Referral Yes  Mobility visit 1 Mobility  Mobility Specialist Start Time (ACUTE ONLY) 1050  Mobility Specialist Stop Time (ACUTE ONLY) 1115  Mobility Specialist Time Calculation (min) (ACUTE ONLY) 25 min   Pt eager for mobility. Required no physical assistance during ambulation, SV. HR up to 108 bpm w/ activity. Pt returned back to sitting on EoB with all needs met, call bell in reach.   Austin [name] Mobility Specialist Please contact via SecureChat or [employer] [phone]

## 2023-07-13 NOTE — Progress Notes (Signed)
 Initial Nutrition Assessment  DOCUMENTATION CODES:   Not applicable  INTERVENTION:  - Regular diet.  - Ensure Plus High Protein po BID, each supplement provides 350 kcal and 20 grams of protein. - Monitor weight trends.   NUTRITION DIAGNOSIS:   Increased nutrient needs related to chronic illness as evidenced by estimated needs.  GOAL:   Patient will meet greater than or equal to 90% of their needs  MONITOR:   PO intake, Supplement acceptance, Weight trends  REASON FOR ASSESSMENT:   Consult Assessment of nutrition requirement/status  ASSESSMENT:   68 year old male with history of systolic CHF with EF of less than 20% with ICD in place, paroxysmal A-fib, CKD stage IIIb, essential hypertension, COPD, BPH, hyperlipidemia who presented with worsening shortness of breath and abdominal pain along with urinary retention and vomiting. Admitted for acute on chronic heart failure.   RD working remotely. Attempted to speak with patient via bedside telephone but no answer.  Per chart review, weight has fluctuated up and down between 196-221# over the past year. Patient noted to have been weighed at 211# on 5/19 and weighed today at 199#. However, he is -8.4L since admission 2 days ago so suspect this is fluid related.   Patient is documented to have consumed 75% of breakfast this AM. No other meals documented since admission. Will add ONS to support oral intake.   Medications reviewed and include: Carafate  Labs reviewed:  Creatinine 1.71   NUTRITION - FOCUSED PHYSICAL EXAM:  RD working remotely  Diet Order:   Diet Order             Diet regular Room service appropriate? Yes; Fluid consistency: Thin  Diet effective now                   EDUCATION NEEDS:  No education needs have been identified at this time  Skin:  Skin Assessment: Reviewed RN Assessment  Last BM:  5/27  Height:  Ht Readings from Last 1 Encounters:  07/12/23 6\' 4"  (1.93 m)   Weight:  Wt  Readings from Last 1 Encounters:  07/13/23 90.3 kg   Ideal Body Weight:  91.8 kg  BMI:  Body mass index is 24.23 kg/m.  Estimated Nutritional Needs:  Kcal:  2250-2450 kcals Protein:  90-115 grams Fluid:  >/= 2.2L    Scheryl Cushing RD, LDN Contact via Secure Chat.

## 2023-07-14 DIAGNOSIS — I5023 Acute on chronic systolic (congestive) heart failure: Secondary | ICD-10-CM | POA: Diagnosis not present

## 2023-07-14 LAB — CBC
HCT: 37.7 % — ABNORMAL LOW (ref 39.0–52.0)
Hemoglobin: 10.9 g/dL — ABNORMAL LOW (ref 13.0–17.0)
MCH: 21.4 pg — ABNORMAL LOW (ref 26.0–34.0)
MCHC: 28.9 g/dL — ABNORMAL LOW (ref 30.0–36.0)
MCV: 73.9 fL — ABNORMAL LOW (ref 80.0–100.0)
Platelets: 231 10*3/uL (ref 150–400)
RBC: 5.1 MIL/uL (ref 4.22–5.81)
RDW: 21.6 % — ABNORMAL HIGH (ref 11.5–15.5)
WBC: 8.4 10*3/uL (ref 4.0–10.5)
nRBC: 0 % (ref 0.0–0.2)

## 2023-07-14 LAB — BASIC METABOLIC PANEL WITH GFR
Anion gap: 10 (ref 5–15)
BUN: 23 mg/dL (ref 8–23)
CO2: 24 mmol/L (ref 22–32)
Calcium: 9.5 mg/dL (ref 8.9–10.3)
Chloride: 107 mmol/L (ref 98–111)
Creatinine, Ser: 1.5 mg/dL — ABNORMAL HIGH (ref 0.61–1.24)
GFR, Estimated: 50 mL/min — ABNORMAL LOW (ref >60)
Glucose, Bld: 105 mg/dL — ABNORMAL HIGH (ref 70–99)
Potassium: 3.6 mmol/L (ref 3.5–5.1)
Sodium: 141 mmol/L (ref 135–145)

## 2023-07-14 LAB — MAGNESIUM: Magnesium: 2.1 mg/dL (ref 1.7–2.4)

## 2023-07-14 MED ORDER — SPIRONOLACTONE 25 MG PO TABS: 25.0000 mg | ORAL_TABLET | Freq: Every day | ORAL | 0 refills | Status: AC

## 2023-07-14 MED ORDER — TAMSULOSIN HCL 0.4 MG PO CAPS: 0.4000 mg | ORAL_CAPSULE | Freq: Every day | ORAL | 0 refills | Status: AC

## 2023-07-14 MED ORDER — POTASSIUM CHLORIDE CRYS ER 20 MEQ PO TBCR
60.0000 meq | EXTENDED_RELEASE_TABLET | Freq: Once | ORAL | Status: AC
  Administered 2023-07-14: 60 meq via ORAL
  Filled 2023-07-14: qty 3

## 2023-07-14 MED ORDER — CEFUROXIME AXETIL 500 MG PO TABS: 500.0000 mg | ORAL_TABLET | Freq: Two times a day (BID) | ORAL | 0 refills | Status: AC

## 2023-07-14 MED ORDER — POLYETHYLENE GLYCOL 3350 17 G PO PACK: 17.0000 g | PACK | Freq: Every day | ORAL | Status: AC | PRN

## 2023-07-14 NOTE — Progress Notes (Signed)
 Advanced Heart Failure Rounding Note  HF Cardiologist: Jules Oar, MD   Chief Complaint: Acute on chronic HFrEF  Subjective:    Good UOP with PO diuretics.   Feels great this morning, sitting up in bed. Denies CP/SOB.   Objective:   Weight Range: 92.1 kg Body mass index is 24.72 kg/m.   Vital Signs:   Temp:  [97.5 F (36.4 C)-99 F (37.2 C)] 98.3 F (36.8 C) (05/30 0745) Pulse Rate:  [69-71] 71 (05/30 0745) Resp:  [16-18] 18 (05/30 0745) BP: (105-119)/(58-93) 105/78 (05/30 0745) SpO2:  [94 %-100 %] 100 % (05/30 0745) Weight:  [92.1 kg] 92.1 kg (05/30 0450) Last BM Date : 07/11/23 (per pt yesterday evening)  Weight change: Filed Weights   07/12/23 1248 07/13/23 0446 07/14/23 0450  Weight: 93.1 kg 90.3 kg 92.1 kg    Intake/Output:   Intake/Output Summary (Last 24 hours) at 07/14/2023 1128 Last data filed at 07/14/2023 0900 Gross per 24 hour  Intake 797 ml  Output 1400 ml  Net -603 ml      Physical Exam    General:  well appearing.  No respiratory difficulty Neck: supple. JVD flat.  Cor: PMI nondisplaced. Regular rate & rhythm. No rubs, gallops or murmurs. Lungs: clear Extremities: no cyanosis, clubbing, rash, edema  Neuro: alert & oriented x 3. Moves all 4 extremities w/o difficulty. Affect pleasant.   Telemetry   V paced 60s (Personally reviewed)    Labs    CBC Recent Labs    07/11/23 2138 07/12/23 0434 07/13/23 0505 07/14/23 0415  WBC 13.9*   < > 9.6 8.4  NEUTROABS 11.9*  --   --   --   HGB 9.4*   < > 10.3* 10.9*  HCT 32.8*   < > 35.6* 37.7*  MCV 74.4*   < > 73.9* 73.9*  PLT 230   < > 228 231   < > = values in this interval not displayed.   Basic Metabolic Panel Recent Labs    09/81/19 0434 07/13/23 0505 07/14/23 0415  NA 138 141 141  K 3.0* 3.8 3.6  CL 111 106 107  CO2 20* 26 24  GLUCOSE 144* 112* 105*  BUN 18 20 23   CREATININE 1.54* 1.71* 1.50*  CALCIUM 8.3* 8.9 9.5  MG 2.0 2.1 2.1  PHOS 1.8* 2.8  --    Liver  Function Tests Recent Labs    07/11/23 2138 07/12/23 0434  AST 25 23  ALT 14 13  ALKPHOS 38 36*  BILITOT 2.0* 2.0*  PROT 6.6 6.3*  ALBUMIN 3.8 3.5   Recent Labs    07/11/23 2138  LIPASE 26   Cardiac Enzymes Recent Labs    07/12/23 0259  CKTOTAL 193    BNP: BNP (last 3 results) Recent Labs    05/08/23 1516 07/03/23 1614 07/11/23 2138  BNP 292.5* 356.0* 3,104.9*    ProBNP (last 3 results) No results for input(s): "PROBNP" in the last 8760 hours.   D-Dimer No results for input(s): "DDIMER" in the last 72 hours. Hemoglobin A1C No results for input(s): "HGBA1C" in the last 72 hours. Fasting Lipid Panel No results for input(s): "CHOL", "HDL", "LDLCALC", "TRIG", "CHOLHDL", "LDLDIRECT" in the last 72 hours. Thyroid  Function Tests Recent Labs    07/11/23 2138  TSH 2.992    Other results:   Imaging    No results found.    Medications:     Scheduled Medications:  apixaban   5 mg Oral BID  digoxin   0.125 mg Oral Daily   doxycycline  100 mg Oral Q12H   feeding supplement  237 mL Oral BID BM   fenofibrate  54 mg Oral Daily   losartan   12.5 mg Oral Daily   mexiletine  200 mg Oral Q12H   pantoprazole   40 mg Oral Daily   sodium chloride  flush  3 mL Intravenous Q12H   spironolactone   25 mg Oral q AM   sucralfate  1 g Oral TID WC & HS   tamsulosin   0.4 mg Oral QPC breakfast   torsemide   20 mg Oral Daily    Infusions:  cefTRIAXone (ROCEPHIN)  IV 2 g (07/13/23 2159)    PRN Medications: acetaminophen  **OR** acetaminophen , albuterol , fentaNYL  (SUBLIMAZE ) injection, HYDROcodone-acetaminophen , melatonin, sodium chloride  flush    Patient Profile   68 y.o. male with history of HFrEF/NICm s/p CRT-D, PAF, CKD III. Admitted with acute respiratory failure 2/2 acute on chronic HFrEF/possible PNA and abdominal pain.    Assessment/Plan   1. Acute on chronic HFrEF - NICM  - Initially diagnosed with HFrEF in 2012. EF previously improved to 50-55% on  medications.  - Echo (6/24): EF now < 20%. He does have single chamber Biotronik.  - R/LHC (7/24) w/ minimal CAD, mildly elevated filling pressures and severely reduced CO/CI by thermo 4.3/1.9.  - Echo 11/24 EF < 20% -> shock - He developed AV block in 10/24 and RV pacing went up > 70% reculting in SR with long AV delay causing frequent paced beats and dropped beats as well.  - CRT upgrade 11/24 with a placement of an atrial lead  - Echo 05/08/23 LV markedly dilated EF 20-25% RV ok  - Echo this admit with EF 20%, RV okay - Has been NYHA II as an outpatient. Readmitted with mild volume overload. ? If urinary retention was contributing. Improved after foley placement. - Continue torsemide  20 mg daily. Volume stable.  - Continue losartan  12.5 mg daily. Off Entresto  d/t low blood pressure - Stopped jardiance  with urinary retention/need for foley - Continue digoxin  0.125 mg daily. Dig level okay - Continue spiro 25 mg daily - Off b-blocker with hx low output  - ICD interrogated. Reviewed with device rep. 94% BiV paced, frequent PACs and PVCs, episode of Afib about 6 hrs in duration on 05/25, no VT/VF - Worry that he may eventually need advanced therapies, most likely VAD. EF remains severely reduced despite BiV pacing.   2. Acute respiratory failure - Suspect 2/2 the above. ? How much urinary retention playing a role in volume overload. - primary team treating empirically for possible CAP.  - Off BiPAP. Now comfortable on RA   3. Paroxysmal A fib  - s/p DCCV to NSR 4/25 - Recurrent AF ~ 6 hrs on 05/25. No recurrence since. - Continue Eliquis  5 mg bid   4. CKD Stage IIIa - Baseline SCr 1.4-1.7 - Scr up slightly 1.5 - Stopped SGLT2i with urinary retention   5. ?RV Lead vegetation - possible that this is due to the "curl-i-cue" that the lead forms in the right heart, as noted on CXR.  - No vegetation on TEE 7/24   6. PVCs - High PVC burden. ~ 50% during prior admission.  - Seems better  suppressed on review of tele - Continue mexilitene 200 bid.  - Follows with EP   7. Microcytic anemia - Hgb 9s-10s - T sat only 3%. S/p IV iron .   8. Urinary retention - Known BPH. Reports  history of prostate surgery. Prostate markedly enlarged on CT. - Has foley. Attempting voiding trial today. Recommend outpatient f/u with Urology. - PSA 4.2>3.6 - Started flomax    9. Abdominal pain - Suspect most likely d/t urinary retention. Resolved after foley placed. - Lipase okay - No acute findings on Xray. CTA w/ markedly enlarged prostate and bladder wall thickening as above  Stable for discharge from HF standpoint. Has f/u in AHF clinic.    HF meds: Eliquis  5 BID Digoxin  0.125 mg daily Losartan  12.5 mg daily Spironolactone  25 mg daily Mexiletine 200 mg BID Torsemide  20 mg daily Keep off Jardiance  with urinary retention.  Length of Stay: 2  Sheryl Donna, NP  07/14/2023, 11:28 AM  Advanced Heart Failure Team Pager [phone] (M-F; 7a - 5p)  Please contact [facility] for night-coverage after hours (5p -7a ) and weekends on [email or web address]

## 2023-07-14 NOTE — Discharge Summary (Addendum)
 Physician Discharge Summary  Austin [name] GEX:[id number] DOB: Dec 17, 1955 DOA: 07/11/2023  PCP: Madonna Schlein, NP  Admit date: 07/11/2023 Discharge date: 07/14/2023  Admitted From: Home Disposition: Home  Recommendations for Outpatient Follow-up:  Follow up with PCP in 1 week with repeat CBC/BMP Outpatient follow-up with cardiology Recommend outpatient evaluation and follow-up with urology Follow up in ED if symptoms worsen or new appear   Home Health: No Equipment/Devices: None  Discharge Condition: Stable CODE STATUS: Full Diet recommendation: Heart healthy/fluid restriction of up to 1200 cc a day  Brief/Interim Summary: 68 year old male with history of systolic CHF with EF of less than 20% with ICD in place, paroxysmal A-fib, CKD stage IIIb, essential hypertension , COPD, BPH, hyperlipidemia presented with worsening shortness of breath and abdominal pain along with urinary retention and vomiting.  On presentation, he was mildly hypoxic needing BiPAP.  Chest x-ray showed no acute chest disease.  CT of chest/abdomen/pelvis was negative for aortic dissection or aneurysm.  Mild groundglass density within bilateral right greater than left perihilar lungs, due to mild edema or infection along with markedly enlarged prostate.  WBC of 13.9.  I sent troponin 70, BNP 3104.9.  Foley catheter was placed for urinary retention.  He was started on IV antibiotics and Lasix .  During the hospitalization, his condition has improved.  He has diuresed well.  Advanced heart failure team evaluated the patient during this hospitalization and has subsequently signed off with recommendations to follow-up with heart failure team as an outpatient.  He feels much better and wants to go home today.  He will be discharged home today with outpatient follow-up with PCP and cardiology.  Discharge Diagnoses:   Acute on chronic systolic heart failure with history of ICD Essential hypertension -Last echo had shown  EF of less than 20%.  Patient has ICD in place.  Repeat echo this admission shows EF of less than 20%. - Continue diet and fluid restriction.  He was treated with IV Lasix  as per heart failure team. Advanced heart failure team evaluated the patient during this hospitalization and has subsequently signed off with recommendations to follow-up with heart failure team as an outpatient.  He feels much better and wants to go home today.  He will be discharged home today with outpatient follow-up with PCP and cardiology. Continue spironolactone , losartan , digoxin , torsemide  on discharge.  Hold Jardiance  due to urinary retention as per cardiology recommendations   Positive troponins - Possibly from above.  No chest pain.  Outpatient follow-up with cardiology  Paroxysmal A-fib - Currently rate controlled.  Continue digoxin , Eliquis  and mexiletine.   Acute respiratory failure with hypoxia Possible community-acquired bacterial pneumonia: Bacteria not identified -Required BiPAP on presentation.  Presented with cough with increased sputum production and imaging could not rule out pneumonia.  Currently on Rocephin and doxycycline: Discharged home on 2 more days of oral Ceftin - Respiratory status has improved: Currently on room air   Acute urinary retention BPH - Foley catheter placed on admission.  Foley catheter removed on 07/13/2023.  Continue Flomax .  Outpatient evaluation and follow-up with urology   CKD stage IIIb Acute metabolic acidosis: Resolved - Baseline creatinine of 1.4-1.7.  No evidence of acute kidney injury.  Creatinine currently stable.  Monitor intermittently as an outpatient   Leukocytosis -Resolved   Anemia of chronic disease Microcytosis - No signs of bleeding.  Hemoglobin stable.  Monitor intermittently   Hypokalemia - Improved   Hyperlipidemia -Continue fenofibrate   Prolonged QT interval - Outpatient  follow-up  Diarrhea -Questionable cause.  Improved.    Physical  deconditioning - PT recommended no PT follow-up  Discharge Instructions  Discharge Instructions     Diet - low sodium heart healthy   Complete by: As directed    Increase activity slowly   Complete by: As directed       Allergies as of 07/14/2023       Reactions   Lisinopril  Cough        Medication List     STOP taking these medications    Jardiance  25 MG Tabs tablet Generic drug: empagliflozin        TAKE these medications    acetaminophen  325 MG tablet Commonly known as: TYLENOL  Take 2 tablets (650 mg total) by mouth every 4 (four) hours as needed for moderate pain (pain score 4-6).   apixaban  5 MG Tabs tablet Commonly known as: ELIQUIS  Take 1 tablet (5 mg total) by mouth 2 (two) times daily. Start taking on 01/17/23 pm   cefUROXime 500 MG tablet Commonly known as: CEFTIN Take 1 tablet (500 mg total) by mouth 2 (two) times daily for 2 days.   digoxin  0.125 MG tablet Commonly known as: LANOXIN  Take 1 tablet (0.125 mg total) by mouth daily.   fenofibrate 48 MG tablet Commonly known as: TRICOR Take 2 tablets by mouth Daily   ferrous sulfate  325 (65 FE) MG tablet Take 1 tablet (325 mg total) by mouth every Monday, Wednesday, and Friday. In the morning   losartan  25 MG tablet Commonly known as: COZAAR  Take 0.5 tablets (12.5 mg total) by mouth daily.   melatonin 3 MG Tabs tablet Take 6 mg by mouth at bedtime as needed (sleep).   mexiletine 200 MG capsule Commonly known as: MEXITIL  Take 1 capsule (200 mg total) by mouth every 12 (twelve) hours.   omeprazole  20 MG capsule Commonly known as: PRILOSEC Take 1 capsule (20 mg total) by mouth daily.   polyethylene glycol 17 g packet Commonly known as: MIRALAX  / GLYCOLAX  Take 17 g by mouth daily as needed (constipation.).   spironolactone  25 MG tablet Commonly known as: ALDACTONE  Take 1 tablet (25 mg total) by mouth daily. What changed:  how much to take when to take this   tamsulosin  0.4 MG Caps  capsule Commonly known as: FLOMAX  Take 1 capsule (0.4 mg total) by mouth daily after supper.   torsemide  20 MG tablet Commonly known as: DEMADEX  Take 1 tablet (20 mg total) by mouth daily.          Follow-up Information     [facility] Follow up on 07/25/2023.   Specialty: Cardiology Why: Advanced Heart Failure Clinic 8:30 AM Entrance C, Free Valet parking Please bring all meds to appointment Contact information: [address] [place]  [address] [phone]        Madonna Schlein, NP. Schedule an appointment as soon as possible for a visit in 1 week(s).   Specialty: Family Medicine Contact information: [address] [place] [address] [phone]                Allergies  Allergen Reactions   Lisinopril  Cough    Consultations: Advanced heart failure team   Procedures/Studies: ECHOCARDIOGRAM COMPLETE Result Date: 07/12/2023    ECHOCARDIOGRAM REPORT   Patient Name:   JASTON HAVENS Date of Exam: 07/12/2023 Medical Rec #:  [id number]        Height:  76.0 in Accession #:    [id number]       Weight:       211.8 lb Date of Birth:  12-Oct-1955         BSA:          2.270 m Patient Age:    67 years         BP:           116/78 mmHg Patient Gender: M                HR:           84 bpm. Exam Location:  Inpatient Procedure: 2D Echo, Color Doppler, Cardiac Doppler and Intracardiac            Opacification Agent (Both Spectral and Color Flow Doppler were            utilized during procedure). Indications:    CHF Acute Systolic I50.21  History:        Patient has prior history of Echocardiogram examinations, most                 recent 01/06/2023.  Sonographer:    Hersey Lorenzo RDCS Referring Phys: [id number] ANASTASSIA DOUTOVA IMPRESSIONS  1. Left ventricular ejection fraction, by estimation, is 20%. The left ventricle has severely decreased function. The left ventricle demonstrates global hypokinesis. The left  ventricular internal cavity size was severely dilated. Left ventricular diastolic parameters are consistent with Grade II diastolic dysfunction (pseudonormalization).  2. Increased echogenicity on the device lead as previously noted. Right ventricular systolic function is normal. The right ventricular size is normal. There is normal pulmonary artery systolic pressure.  3. Left atrial size was severely dilated.  4. Right atrial size was mildly dilated.  5. The mitral valve is normal in structure. Mild mitral valve regurgitation. No evidence of mitral stenosis.  6. The aortic valve is tricuspid. Aortic valve regurgitation is not visualized. No aortic stenosis is present.  7. The inferior vena cava is normal in size with greater than 50% respiratory variability, suggesting right atrial pressure of 3 mmHg. FINDINGS  Left Ventricle: Left ventricular ejection fraction, by estimation, is 20%. The left ventricle has severely decreased function. The left ventricle demonstrates global hypokinesis. The left ventricular internal cavity size was severely dilated. There is no left ventricular hypertrophy. Left ventricular diastolic parameters are consistent with Grade II diastolic dysfunction (pseudonormalization).  LV Wall Scoring: The antero-lateral wall, inferior septum, inferior wall, and posterior wall are akinetic. The entire anterior wall, anterior septum, apical lateral segment, apical inferior segment, and apex are hypokinetic. Right Ventricle: Increased echogenicity on the device lead as previously noted. The right ventricular size is normal. No increase in right ventricular wall thickness. Right ventricular systolic function is normal. There is normal pulmonary artery systolic pressure. The tricuspid regurgitant velocity is 2.55 m/s, and with an assumed right atrial pressure of 3 mmHg, the estimated right ventricular systolic pressure is 29.0 mmHg. Left Atrium: Left atrial size was severely dilated. Right Atrium: Right  atrial size was mildly dilated. Pericardium: There is no evidence of pericardial effusion. Mitral Valve: The mitral valve is normal in structure. Mild mitral valve regurgitation. No evidence of mitral valve stenosis. Tricuspid Valve: The tricuspid valve is normal in structure. Tricuspid valve regurgitation is trivial. No evidence of tricuspid stenosis. Aortic Valve: The aortic valve is tricuspid. Aortic valve regurgitation is not visualized. No aortic stenosis is present. Pulmonic Valve: The pulmonic valve was normal in structure. Pulmonic valve  regurgitation is not visualized. No evidence of pulmonic stenosis. Aorta: The aortic root is normal in size and structure. Venous: The inferior vena cava is normal in size with greater than 50% respiratory variability, suggesting right atrial pressure of 3 mmHg. IAS/Shunts: No atrial level shunt detected by color flow Doppler. Additional Comments: A device lead is visualized.  LEFT VENTRICLE PLAX 2D LVIDd:         8.00 cm LVIDs:         6.10 cm LV PW:         1.10 cm LV IVS:        1.00 cm LVOT diam:     2.30 cm LV SV:         53 LV SV Index:   23 LVOT Area:     4.15 cm  LV Volumes (MOD) LV vol d, MOD A2C: 352.0 ml LV vol d, MOD A4C: 370.0 ml LV vol s, MOD A2C: 289.0 ml LV vol s, MOD A4C: 286.0 ml LV SV MOD A2C:     63.0 ml LV SV MOD A4C:     370.0 ml LV SV MOD BP:      94.5 ml RIGHT VENTRICLE             IVC RV S prime:     12.40 cm/s  IVC diam: 1.60 cm TAPSE (M-mode): 2.1 cm LEFT ATRIUM              Index        RIGHT ATRIUM           Index LA diam:        4.40 cm  1.94 cm/m   RA Area:     21.40 cm LA Vol (A2C):   132.0 ml 58.15 ml/m  RA Volume:   69.70 ml  30.71 ml/m LA Vol (A4C):   88.7 ml  39.08 ml/m LA Biplane Vol: 108.0 ml 47.58 ml/m  AORTIC VALVE LVOT Vmax:   76.60 cm/s LVOT Vmean:  49.000 cm/s LVOT VTI:    0.127 m  AORTA Ao Root diam: 3.50 cm Ao Asc diam:  3.50 cm MITRAL VALVE               TRICUSPID VALVE MV Area (PHT): 4.86 cm    TR Peak grad:   26.0 mmHg  MV Decel Time: 156 msec    TR Vmax:        255.00 cm/s MV E velocity: 97.50 cm/s MV A velocity: 73.90 cm/s  SHUNTS MV E/A ratio:  1.32        Systemic VTI:  0.13 m                            Systemic Diam: 2.30 cm Maudine Sos MD Electronically signed by Maudine Sos MD Signature Date/Time: 07/12/2023/2:00:59 PM    Final    CT Angio Chest/Abd/Pel for Dissection W and/or Wo Contrast Result Date: 07/11/2023 CLINICAL DATA:  Shortness of breath lower abdominal pain tachypnea EXAM: CT ANGIOGRAPHY CHEST, ABDOMEN AND PELVIS TECHNIQUE: Non-contrast CT of the chest was initially obtained. Multidetector CT imaging through the chest, abdomen and pelvis was performed using the standard protocol during bolus administration of intravenous contrast. Multiplanar reconstructed images and MIPs were obtained and reviewed to evaluate the vascular anatomy. RADIATION DOSE REDUCTION: This exam was performed according to the departmental dose-optimization program which includes automated exposure control, adjustment of the mA and/or kV according to patient  size and/or use of iterative reconstruction technique. CONTRAST:  OMNIPAQUE  IOHEXOL  350 MG/ML SOLN COMPARISON:  CT 01/05/2023 FINDINGS: CTA CHEST FINDINGS Cardiovascular: Non contrasted images of the chest demonstrate no acute intramural hematoma. Nonaneurysmal aorta. No dissection is seen. Left-sided multi lead cardiac pacing device. Mild cardiomegaly. No pericardial effusion Mediastinum/Nodes: Patent trachea. No thyroid  mass. No suspicious lymph nodes. Esophagus within normal limits Lungs/Pleura: Small right greater than left pleural effusions. Mild emphysema. Mild ground-glass density within the bilateral right greater than left perihilar lungs. Musculoskeletal: Sternum appears intact. No acute osseous abnormality Review of the MIP images confirms the above findings. CTA ABDOMEN AND PELVIS FINDINGS VASCULAR Aorta: Normal caliber aorta without aneurysm, dissection,  vasculitis or significant stenosis. Celiac: Patent without evidence of aneurysm, dissection, vasculitis or significant stenosis. SMA: Patent without evidence of aneurysm, dissection, vasculitis or significant stenosis. Renals: Both renal arteries are patent without evidence of aneurysm, dissection, vasculitis, fibromuscular dysplasia or significant stenosis. IMA: Patent without evidence of aneurysm, dissection, vasculitis or significant stenosis. Inflow: Patent without evidence of aneurysm, dissection, vasculitis or significant stenosis. Veins: Suboptimally assessed Review of the MIP images confirms the above findings. NON-VASCULAR Hepatobiliary: No focal liver abnormality is seen. No gallstones, gallbladder wall thickening, or biliary dilatation. Pancreas: Unremarkable. No pancreatic ductal dilatation or surrounding inflammatory changes. Spleen: Normal in size. Small hyperenhancing focus measuring 5 mm within the anterior spleen, unchanged. Adrenals/Urinary Tract: Adrenal glands are normal. Kidneys show no hydronephrosis. The bladder is diffusely thick walled. Stomach/Bowel: Stomach is nonenlarged. No dilated small bowel. No acute bowel wall thickening. Diverticular disease of the sigmoid colon. Lymphatic: No suspicious lymph nodes Reproductive: Markedly enlarged prostate Other: Negative for pelvic effusion or free air. Small fat containing inguinal hernias. Small fat containing periumbilical hernia. Musculoskeletal: No acute osseous abnormality. Review of the MIP images confirms the above findings. IMPRESSION: 1. Negative for acute aortic dissection or aneurysm. 2. Small right greater than left pleural effusions. Mild ground-glass density within the bilateral right greater than left perihilar lungs, either due to mild edema or infection. Mild cardiomegaly. 3. Markedly enlarged prostate. Diffuse bladder wall thickening likely due to chronic bladder outlet obstruction versus cystitis. 4. Diverticular disease of the  sigmoid colon without acute inflammatory process. Electronically Signed   By: Esmeralda Hedge M.D.   On: 07/11/2023 23:19   DG Abd Acute W/Chest Result Date: 07/11/2023 CLINICAL DATA:  Shortness of breath and lower abdominal pain x3 days. EXAM: DG ABDOMEN ACUTE WITH 1 VIEW CHEST COMPARISON:  January 14, 2023 FINDINGS: Stable multilead AICD positioning is noted. There is no evidence of dilated bowel loops or free intraperitoneal air. No radiopaque calculi or other significant radiographic abnormality is seen. Heart size and mediastinal contours are within normal limits. There is no evidence of an acute infiltrate, pleural effusion or pneumothorax. IMPRESSION: Negative abdominal radiographs.  No acute cardiopulmonary disease. Electronically Signed   By: Virgle Grime M.D.   On: 07/11/2023 22:23      Subjective: Patient seen and examined at bedside.  He feels much better and wants to monitor.  No fever, vomiting, abdominal pain reported.  Shortness of breath is improved.  Discharge Exam: Vitals:   07/14/23 0450 07/14/23 0745  BP: (!) 119/93 105/78  Pulse:  71  Resp: 18 18  Temp: 98.6 F (37 C) 98.3 F (36.8 C)  SpO2: 99% 100%    General: Pt is alert, awake, not in acute distress.  On room air.  Chronically ill and deconditioned. Cardiovascular: rate controlled, S1/S2 + Respiratory: bilateral  decreased breath sounds at bases with scattered crackles Abdominal: Soft, NT, ND, bowel sounds + Extremities: Trace lower extremity edema; no cyanosis    The results of significant diagnostics from this hospitalization (including imaging, microbiology, ancillary and laboratory) are listed below for reference.     Microbiology: Recent Results (from the past 240 hours)  Culture, blood (routine x 2) Call MD if unable to obtain prior to antibiotics being given     Status: None (Preliminary result)   Collection Time: 07/12/23 12:19 AM   Specimen: BLOOD  Result Value Ref Range Status   Specimen  Description BLOOD SITE NOT SPECIFIED  Final   Special Requests   Final    BOTTLES DRAWN AEROBIC AND ANAEROBIC Blood Culture results may not be optimal due to an inadequate volume of blood received in culture bottles   Culture   Final    NO GROWTH 1 DAY Performed at [facility], [address] [place], [place] [address]    Report Status PENDING  Incomplete  Culture, blood (routine x 2) Call MD if unable to obtain prior to antibiotics being given     Status: None (Preliminary result)   Collection Time: 07/12/23  1:48 AM   Specimen: BLOOD  Result Value Ref Range Status   Specimen Description BLOOD SITE NOT SPECIFIED  Final   Special Requests   Final    BOTTLES DRAWN AEROBIC AND ANAEROBIC Blood Culture adequate volume   Culture   Final    NO GROWTH 1 DAY Performed at [facility], [address], [place], [place] [address]    Report Status PENDING  Incomplete  Resp panel by RT-PCR (RSV, Flu A&B, Covid) Peripheral     Status: None   Collection Time: 07/12/23  2:59 AM   Specimen: Peripheral; Nasal Swab  Result Value Ref Range Status   SARS Coronavirus 2 by RT PCR NEGATIVE NEGATIVE Final   Influenza A by PCR NEGATIVE NEGATIVE Final   Influenza B by PCR NEGATIVE NEGATIVE Final    Comment: (NOTE) The Xpert Xpress SARS-CoV-2/FLU/RSV plus assay is intended as an aid in the diagnosis of influenza from Nasopharyngeal swab specimens and should not be used as a sole basis for treatment. Nasal washings and aspirates are unacceptable for Xpert Xpress SARS-CoV-2/FLU/RSV testing.  Fact Sheet for Patients: BloggerCourse.com  Fact Sheet for Healthcare Providers: SeriousBroker.it  This test is not yet approved or cleared by the [place]  FDA and has been authorized for detection and/or diagnosis of SARS-CoV-2 by FDA under an Emergency Use Authorization (EUA). This EUA will remain in effect (meaning this test can be used) for the  duration of the COVID-19 declaration under Section 564(b)(1) of the Act, 21 U.S.C. section 360bbb-3(b)(1), unless the authorization is terminated or revoked.     Resp Syncytial Virus by PCR NEGATIVE NEGATIVE Final    Comment: (NOTE) Fact Sheet for Patients: BloggerCourse.com  Fact Sheet for Healthcare Providers: SeriousBroker.it  This test is not yet approved or cleared by the [place]  FDA and has been authorized for detection and/or diagnosis of SARS-CoV-2 by FDA under an Emergency Use Authorization (EUA). This EUA will remain in effect (meaning this test can be used) for the duration of the COVID-19 declaration under Section 564(b)(1) of the Act, 21 U.S.C. section 360bbb-3(b)(1), unless the authorization is terminated or revoked.  Performed at [facility], [address] [place], [place] [address]      Labs: BNP (last 3 results) Recent Labs  05/08/23 1516 07/03/23 1614 07/11/23 2138  BNP 292.5* 356.0* 3,104.9*   Basic Metabolic Panel: Recent Labs  Lab 07/11/23 2138 07/12/23 0259 07/12/23 0434 07/13/23 0505 07/14/23 0415  NA 140  --  138 141 141  K 3.5  --  3.0* 3.8 3.6  CL 109  --  111 106 107  CO2 19*  --  20* 26 24  GLUCOSE 123*  --  144* 112* 105*  BUN 18  --  18 20 23   CREATININE 1.40*  --  1.54* 1.71* 1.50*  CALCIUM 9.1  --  8.3* 8.9 9.5  MG 2.1  --  2.0 2.1 2.1  PHOS  --  1.1* 1.8* 2.8  --    Liver Function Tests: Recent Labs  Lab 07/11/23 2138 07/12/23 0434  AST 25 23  ALT 14 13  ALKPHOS 38 36*  BILITOT 2.0* 2.0*  PROT 6.6 6.3*  ALBUMIN 3.8 3.5   Recent Labs  Lab 07/11/23 2138  LIPASE 26   No results for input(s): "AMMONIA" in the last 168 hours. CBC: Recent Labs  Lab 07/11/23 2138 07/12/23 0434 07/13/23 0505 07/14/23 0415  WBC 13.9* 10.1 9.6 8.4  NEUTROABS 11.9*  --   --   --   HGB 9.4* 9.1* 10.3* 10.9*  HCT 32.8* 31.5* 35.6* 37.7*  MCV 74.4* 73.6* 73.9* 73.9*   PLT 230 216 228 231   Cardiac Enzymes: Recent Labs  Lab 07/12/23 0259  CKTOTAL 193   BNP: Invalid input(s): "POCBNP" CBG: No results for input(s): "GLUCAP" in the last 168 hours. D-Dimer No results for input(s): "DDIMER" in the last 72 hours. Hgb A1c No results for input(s): "HGBA1C" in the last 72 hours. Lipid Profile No results for input(s): "CHOL", "HDL", "LDLCALC", "TRIG", "CHOLHDL", "LDLDIRECT" in the last 72 hours. Thyroid  function studies Recent Labs    07/11/23 2138  TSH 2.992   Anemia work up Recent Labs    07/12/23 1015  FERRITIN 35  TIBC 433  IRON  13*   Urinalysis    Component Value Date/Time   COLORURINE STRAW (A) 07/12/2023 0148   APPEARANCEUR CLEAR 07/12/2023 0148   LABSPEC 1.011 07/12/2023 0148   PHURINE 7.0 07/12/2023 0148   GLUCOSEU >=500 (A) 07/12/2023 0148   HGBUR NEGATIVE 07/12/2023 0148   BILIRUBINUR NEGATIVE 07/12/2023 0148   KETONESUR NEGATIVE 07/12/2023 0148   PROTEINUR NEGATIVE 07/12/2023 0148   NITRITE NEGATIVE 07/12/2023 0148   LEUKOCYTESUR NEGATIVE 07/12/2023 0148   Sepsis Labs Recent Labs  Lab 07/11/23 2138 07/12/23 0434 07/13/23 0505 07/14/23 0415  WBC 13.9* 10.1 9.6 8.4   Microbiology Recent Results (from the past 240 hours)  Culture, blood (routine x 2) Call MD if unable to obtain prior to antibiotics being given     Status: None (Preliminary result)   Collection Time: 07/12/23 12:19 AM   Specimen: BLOOD  Result Value Ref Range Status   Specimen Description BLOOD SITE NOT SPECIFIED  Final   Special Requests   Final    BOTTLES DRAWN AEROBIC AND ANAEROBIC Blood Culture results may not be optimal due to an inadequate volume of blood received in culture bottles   Culture   Final    NO GROWTH 1 DAY Performed at [facility], [address], [place], [place] [address]    Report Status PENDING  Incomplete  Culture, blood (routine x 2) Call MD if unable to obtain prior to antibiotics being given     Status: None  (Preliminary result)   Collection Time:  07/12/23  1:48 AM   Specimen: BLOOD  Result Value Ref Range Status   Specimen Description BLOOD SITE NOT SPECIFIED  Final   Special Requests   Final    BOTTLES DRAWN AEROBIC AND ANAEROBIC Blood Culture adequate volume   Culture   Final    NO GROWTH 1 DAY Performed at [facility], [address], [place], [place] [address]    Report Status PENDING  Incomplete  Resp panel by RT-PCR (RSV, Flu A&B, Covid) Peripheral     Status: None   Collection Time: 07/12/23  2:59 AM   Specimen: Peripheral; Nasal Swab  Result Value Ref Range Status   SARS Coronavirus 2 by RT PCR NEGATIVE NEGATIVE Final   Influenza A by PCR NEGATIVE NEGATIVE Final   Influenza B by PCR NEGATIVE NEGATIVE Final    Comment: (NOTE) The Xpert Xpress SARS-CoV-2/FLU/RSV plus assay is intended as an aid in the diagnosis of influenza from Nasopharyngeal swab specimens and should not be used as a sole basis for treatment. Nasal washings and aspirates are unacceptable for Xpert Xpress SARS-CoV-2/FLU/RSV testing.  Fact Sheet for Patients: BloggerCourse.com  Fact Sheet for Healthcare Providers: SeriousBroker.it  This test is not yet approved or cleared by the [place]  FDA and has been authorized for detection and/or diagnosis of SARS-CoV-2 by FDA under an Emergency Use Authorization (EUA). This EUA will remain in effect (meaning this test can be used) for the duration of the COVID-19 declaration under Section 564(b)(1) of the Act, 21 U.S.C. section 360bbb-3(b)(1), unless the authorization is terminated or revoked.     Resp Syncytial Virus by PCR NEGATIVE NEGATIVE Final    Comment: (NOTE) Fact Sheet for Patients: BloggerCourse.com  Fact Sheet for Healthcare Providers: SeriousBroker.it  This test is not yet approved or cleared by the [place]  FDA and has been  authorized for detection and/or diagnosis of SARS-CoV-2 by FDA under an Emergency Use Authorization (EUA). This EUA will remain in effect (meaning this test can be used) for the duration of the COVID-19 declaration under Section 564(b)(1) of the Act, 21 U.S.C. section 360bbb-3(b)(1), unless the authorization is terminated or revoked.  Performed at [facility], [address] [place], [place] [address]      Time coordinating discharge: 35 minutes  SIGNED:   Audria Leather, MD  [facility] 07/14/2023, 8:25 AM

## 2023-07-14 NOTE — Plan of Care (Signed)
  Problem: Health Behavior/Discharge Planning: Goal: Ability to manage health-related needs will improve Outcome: Progressing   Problem: Clinical Measurements: Goal: Will remain free from infection Outcome: Progressing Goal: Respiratory complications will improve Outcome: Progressing Goal: Cardiovascular complication will be avoided Outcome: Progressing   Problem: Activity: Goal: Risk for activity intolerance will decrease Outcome: Progressing   Problem: Nutrition: Goal: Adequate nutrition will be maintained Outcome: Progressing   Problem: Safety: Goal: Ability to remain free from injury will improve Outcome: Progressing   Problem: Activity: Goal: Capacity to carry out activities will improve Outcome: Progressing   Problem: Cardiac: Goal: Ability to achieve and maintain adequate cardiopulmonary perfusion will improve Outcome: Progressing

## 2023-07-14 NOTE — Plan of Care (Signed)
 Problem: Education: Goal: Knowledge of General Education information will improve Description: Including pain rating scale, medication(s)/side effects and non-pharmacologic comfort measures 07/14/2023 0937 by Sunnie England, RN Outcome: Adequate for Discharge 07/14/2023 [phone] by Sunnie England, RN Outcome: Adequate for Discharge   Problem: Health Behavior/Discharge Planning: Goal: Ability to manage health-related needs will improve 07/14/2023 0937 by Sunnie England, RN Outcome: Adequate for Discharge 07/14/2023 [phone] by Sunnie England, RN Outcome: Adequate for Discharge   Problem: Clinical Measurements: Goal: Ability to maintain clinical measurements within normal limits will improve 07/14/2023 0937 by Sunnie England, RN Outcome: Adequate for Discharge 07/14/2023 [phone] by Sunnie England, RN Outcome: Adequate for Discharge Goal: Will remain free from infection 07/14/2023 8119 by Sunnie England, RN Outcome: Adequate for Discharge 07/14/2023 [phone] by Sunnie England, RN Outcome: Adequate for Discharge Goal: Diagnostic test results will improve 07/14/2023 0937 by Sunnie England, RN Outcome: Adequate for Discharge 07/14/2023 [phone] by Sunnie England, RN Outcome: Adequate for Discharge Goal: Respiratory complications will improve 07/14/2023 2130 by Sunnie England, RN Outcome: Adequate for Discharge 07/14/2023 [phone] by Sunnie England, RN Outcome: Adequate for Discharge Goal: Cardiovascular complication will be avoided 07/14/2023 8469 by Sunnie England, RN Outcome: Adequate for Discharge 07/14/2023 [phone] by Sunnie England, RN Outcome: Adequate for Discharge   Problem: Activity: Goal: Risk for activity intolerance will decrease 07/14/2023 0937 by Sunnie England, RN Outcome: Adequate for Discharge 07/14/2023 [phone] by Sunnie England, RN Outcome: Adequate for Discharge   Problem: Nutrition: Goal: Adequate nutrition will be  maintained 07/14/2023 0937 by Sunnie England, RN Outcome: Adequate for Discharge 07/14/2023 [phone] by Sunnie England, RN Outcome: Adequate for Discharge   Problem: Coping: Goal: Level of anxiety will decrease 07/14/2023 0937 by Sunnie England, RN Outcome: Adequate for Discharge 07/14/2023 [phone] by Sunnie England, RN Outcome: Adequate for Discharge   Problem: Elimination: Goal: Will not experience complications related to bowel motility 07/14/2023 0937 by Sunnie England, RN Outcome: Adequate for Discharge 07/14/2023 [phone] by Sunnie England, RN Outcome: Adequate for Discharge Goal: Will not experience complications related to urinary retention 07/14/2023 0937 by Sunnie England, RN Outcome: Adequate for Discharge 07/14/2023 [phone] by Sunnie England, RN Outcome: Adequate for Discharge   Problem: Pain Managment: Goal: General experience of comfort will improve and/or be controlled 07/14/2023 0937 by Sunnie England, RN Outcome: Adequate for Discharge 07/14/2023 [phone] by Sunnie England, RN Outcome: Adequate for Discharge   Problem: Safety: Goal: Ability to remain free from injury will improve 07/14/2023 0937 by Sunnie England, RN Outcome: Adequate for Discharge 07/14/2023 [phone] by Sunnie England, RN Outcome: Adequate for Discharge   Problem: Skin Integrity: Goal: Risk for impaired skin integrity will decrease 07/14/2023 0937 by Sunnie England, RN Outcome: Adequate for Discharge 07/14/2023 [phone] by Sunnie England, RN Outcome: Adequate for Discharge   Problem: Education: Goal: Ability to demonstrate management of disease process will improve 07/14/2023 0937 by Sunnie England, RN Outcome: Adequate for Discharge 07/14/2023 [phone] by Sunnie England, RN Outcome: Adequate for Discharge Goal: Ability to verbalize understanding of medication therapies will improve 07/14/2023 0937 by Sunnie England, RN Outcome: Adequate for  Discharge 07/14/2023 [phone] by Sunnie England, RN Outcome: Adequate for Discharge Goal: Individualized Educational Video(s) 07/14/[phone] by Sunnie England, RN Outcome: Adequate for Discharge 07/14/2023 [phone] by Sunnie England, RN Outcome: Adequate for Discharge   Problem: Activity: Goal: Capacity  to carry out activities will improve 07/14/2023 0937 by Sunnie England, RN Outcome: Adequate for Discharge 07/14/2023 [phone] by Sunnie England, RN Outcome: Adequate for Discharge   Problem: Cardiac: Goal: Ability to achieve and maintain adequate cardiopulmonary perfusion will improve 07/14/2023 0937 by Sunnie England, RN Outcome: Adequate for Discharge 07/14/2023 [phone] by Sunnie England, RN Outcome: Adequate for Discharge   Problem: Activity: Goal: Ability to tolerate increased activity will improve 07/14/2023 0937 by Sunnie England, RN Outcome: Adequate for Discharge 07/14/2023 [phone] by Sunnie England, RN Outcome: Adequate for Discharge   Problem: Clinical Measurements: Goal: Ability to maintain a body temperature in the normal range will improve 07/14/2023 0937 by Sunnie England, RN Outcome: Adequate for Discharge 07/14/2023 [phone] by Sunnie England, RN Outcome: Adequate for Discharge   Problem: Respiratory: Goal: Ability to maintain adequate ventilation will improve 07/14/2023 0937 by Sunnie England, RN Outcome: Adequate for Discharge 07/14/2023 [phone] by Sunnie England, RN Outcome: Adequate for Discharge Goal: Ability to maintain a clear airway will improve 07/14/2023 0937 by Sunnie England, RN Outcome: Adequate for Discharge 07/14/2023 [phone] by Sunnie England, RN Outcome: Adequate for Discharge

## 2023-07-15 LAB — LEGIONELLA PNEUMOPHILA SEROGP 1 UR AG: L. pneumophila Serogp 1 Ur Ag: NEGATIVE

## 2023-07-17 LAB — CULTURE, BLOOD (ROUTINE X 2)
Culture: NO GROWTH
Culture: NO GROWTH
Special Requests: ADEQUATE

## 2023-07-23 ENCOUNTER — Emergency Department (HOSPITAL_COMMUNITY)

## 2023-07-23 ENCOUNTER — Emergency Department (HOSPITAL_COMMUNITY)
Admission: EM | Admit: 2023-07-23 | Discharge: 2023-07-23 | Disposition: A | Discharge status: Home / Self Care | Attending: Emergency Medicine | Admitting: Emergency Medicine

## 2023-07-23 ENCOUNTER — Other Ambulatory Visit: Payer: Self-pay

## 2023-07-23 ENCOUNTER — Encounter (HOSPITAL_COMMUNITY): Payer: Self-pay

## 2023-07-23 DIAGNOSIS — I130 Hypertensive heart and chronic kidney disease with heart failure and stage 1 through stage 4 chronic kidney disease, or unspecified chronic kidney disease: Secondary | ICD-10-CM | POA: Insufficient documentation

## 2023-07-23 DIAGNOSIS — I509 Heart failure, unspecified: Secondary | ICD-10-CM | POA: Diagnosis not present

## 2023-07-23 DIAGNOSIS — N183 Chronic kidney disease, stage 3 unspecified: Secondary | ICD-10-CM | POA: Insufficient documentation

## 2023-07-23 DIAGNOSIS — Z7901 Long term (current) use of anticoagulants: Secondary | ICD-10-CM | POA: Insufficient documentation

## 2023-07-23 DIAGNOSIS — Z79899 Other long term (current) drug therapy: Secondary | ICD-10-CM | POA: Diagnosis not present

## 2023-07-23 DIAGNOSIS — R0602 Shortness of breath: Secondary | ICD-10-CM | POA: Insufficient documentation

## 2023-07-23 DIAGNOSIS — J449 Chronic obstructive pulmonary disease, unspecified: Secondary | ICD-10-CM | POA: Diagnosis not present

## 2023-07-23 DIAGNOSIS — I5022 Chronic systolic (congestive) heart failure: Secondary | ICD-10-CM

## 2023-07-23 LAB — CBC
HCT: 32.8 % — ABNORMAL LOW (ref 39.0–52.0)
Hemoglobin: 9.2 g/dL — ABNORMAL LOW (ref 13.0–17.0)
MCH: 22.5 pg — ABNORMAL LOW (ref 26.0–34.0)
MCHC: 28 g/dL — ABNORMAL LOW (ref 30.0–36.0)
MCV: 80.2 fL (ref 80.0–100.0)
Platelets: 243 10*3/uL (ref 150–400)
RBC: 4.09 MIL/uL — ABNORMAL LOW (ref 4.22–5.81)
RDW: 23.7 % — ABNORMAL HIGH (ref 11.5–15.5)
WBC: 7.6 10*3/uL (ref 4.0–10.5)
nRBC: 0 % (ref 0.0–0.2)

## 2023-07-23 LAB — URINALYSIS, ROUTINE W REFLEX MICROSCOPIC
Bilirubin Urine: NEGATIVE
Glucose, UA: 150 mg/dL — AB
Hgb urine dipstick: NEGATIVE
Ketones, ur: NEGATIVE mg/dL
Leukocytes,Ua: NEGATIVE
Nitrite: NEGATIVE
Protein, ur: NEGATIVE mg/dL
Specific Gravity, Urine: 1.005 (ref 1.005–1.030)
pH: 7 (ref 5.0–8.0)

## 2023-07-23 LAB — COMPREHENSIVE METABOLIC PANEL WITH GFR
ALT: 18 U/L (ref 0–44)
AST: 26 U/L (ref 15–41)
Albumin: 3.3 g/dL — ABNORMAL LOW (ref 3.5–5.0)
Alkaline Phosphatase: 43 U/L (ref 38–126)
Anion gap: 8 (ref 5–15)
BUN: 13 mg/dL (ref 8–23)
CO2: 21 mmol/L — ABNORMAL LOW (ref 22–32)
Calcium: 8.7 mg/dL — ABNORMAL LOW (ref 8.9–10.3)
Chloride: 107 mmol/L (ref 98–111)
Creatinine, Ser: 1.54 mg/dL — ABNORMAL HIGH (ref 0.61–1.24)
GFR, Estimated: 49 mL/min — ABNORMAL LOW (ref >60)
Glucose, Bld: 134 mg/dL — ABNORMAL HIGH (ref 70–99)
Potassium: 3.8 mmol/L (ref 3.5–5.1)
Sodium: 136 mmol/L (ref 135–145)
Total Bilirubin: 1.1 mg/dL (ref 0.0–1.2)
Total Protein: 5.7 g/dL — ABNORMAL LOW (ref 6.5–8.1)

## 2023-07-23 LAB — TROPONIN I (HIGH SENSITIVITY)
Troponin I (High Sensitivity): 29 ng/L — ABNORMAL HIGH (ref <18)
Troponin I (High Sensitivity): 28 ng/L — ABNORMAL HIGH (ref <18)

## 2023-07-23 LAB — LIPASE, BLOOD: Lipase: 30 U/L (ref 11–51)

## 2023-07-23 LAB — BRAIN NATRIURETIC PEPTIDE: B Natriuretic Peptide: 788.5 pg/mL — ABNORMAL HIGH (ref 0.0–100.0)

## 2023-07-23 MED ORDER — TORSEMIDE 20 MG PO TABS: 20.0000 mg | ORAL_TABLET | Freq: Every day | ORAL | 3 refills | Status: DC

## 2023-07-23 MED ORDER — FUROSEMIDE 10 MG/ML IJ SOLN
60.0000 mg | Freq: Once | INTRAMUSCULAR | Status: AC
  Administered 2023-07-23: 60 mg via INTRAVENOUS
  Filled 2023-07-23: qty 6

## 2023-07-23 NOTE — ED Notes (Signed)
 To x-ray

## 2023-07-23 NOTE — ED Notes (Signed)
 Ambulated Pt in hallway with pulse ox . Pt SATs were 98 to 100 %. Pulse ox only dropped when Pt was talking.

## 2023-07-23 NOTE — ED Provider Triage Note (Signed)
 Emergency Medicine Provider Triage Evaluation Note  Austin [name] , a 68 y.o. male  was evaluated in triage.  Pt complains of shortness of breath worsened in the past few days.  Reports he has been out of his digoxin  and fluid pill for the past month and has not been will get them from the [place].  He feels that he is having some abdominal bloating today and thinks it may be fluid.  Not having nausea or vomiting.  No diarrhea or constipation is having normal bowel movements.  No fevers.  No recent cough or cold symptoms.  No chest pain but is feeling more short of breath.  Reports is worse at night that it is in the day.  Feels worse with laying back flat.  Review of Systems  Positive:  Negative:   Physical Exam  BP 121/89   Pulse 70   Temp 98 F (36.7 C)   Resp (!) 22   Ht 6\' 4"  (1.93 m)   Wt 92.1 kg   SpO2 100%   BMI 24.72 kg/m  Gen:   Awake, no distress   Resp:  Normal effort does have a little trouble speaking in full sentences the more he talks. MSK:   Moves extremities without difficulty  Other:  Abdomen does appear slightly distended but soft and nontender to palpation.  Medical Decision Making  Medically screening exam initiated at 6:46 PM.  Appropriate orders placed.  Concha Deed was informed that the remainder of the evaluation will be completed by another provider, this initial triage assessment does not replace that evaluation, and the importance of remaining in the ED until their evaluation is complete.  Patient has EF of 20%, has not been able to get digoxin  or his fluid pill.  Labs ordered.  Patient recently admitted.   Spence Dux, [place] 07/23/23 [phone]

## 2023-07-23 NOTE — Discharge Instructions (Addendum)
 Please follow-up in the cardiology clinic on Tuesday, in 2 days, as scheduled.  You are prescribed a medication called torsemide , Bumex, which is a diuretic.  This to help you pee the water out of your lungs and legs.  I represcribed this medicine to your pharmacy, please be sure to pick this up tomorrow and begin taking it.  Please return to the ED if you have worsening symptoms.

## 2023-07-23 NOTE — ED Provider Notes (Signed)
 [place] EMERGENCY DEPARTMENT AT [facility] Provider Note   CSN: [id number] Arrival date & time: 07/23/23  1728     History  Chief Complaint  Patient presents with   Abdominal Pain   Shortness of Breath    Austin [name] is a 68 y.o. male with a history of congestive heart failure with EF 20%, ICD, paroxysmal A-fib, on Eliquis , stage III kidney disease, essential hypertension, COPD, BPH, hyperlipidemia, presenting to ED with complaint of shortness of breath, abdominal pain.  Patient was just discharged from the hospital about 10 days ago for very similar presentation.  At that time the patient had a CTA dissection study performed the chest abdomen pelvis, with a mild groundglass density in the bilateral lower lobes, for which she was treated with IV antibiotics and Lasix .  He also urinary retention for which a Foley was placed.  He was diuresed with improvement of his symptoms.  He returns today complaining of similar presentation.  I reviewed his home medications he had present at the bedside.  He is not currently taking any diuretic medicine.  He does have a cardiology follow-up appointment in 2 days.  He reports he is eating and drinking fine.  No vomiting or diarrhea.  He has not had any urinary retention issues.  From his medication discharge summary, he was discharged spironolactone , digoxin , torsemide .  However he does not have torsemide   HPI     Home Medications Prior to Admission medications   Medication Sig Start Date End Date Taking? Authorizing Provider  acetaminophen  (TYLENOL ) 325 MG tablet Take 2 tablets (650 mg total) by mouth every 4 (four) hours as needed for moderate pain (pain score 4-6). 01/14/23   Arrien, Curlee Doss, MD  apixaban  (ELIQUIS ) 5 MG TABS tablet Take 1 tablet (5 mg total) by mouth 2 (two) times daily. Start taking on 01/17/23 pm 01/14/23   Arrien, Curlee Doss, MD  digoxin  (LANOXIN ) 0.125 MG tablet Take 1 tablet (0.125 mg total)  by mouth daily. 09/06/22   Sheryl Donna, NP  fenofibrate  (TRICOR ) 48 MG tablet Take 2 tablets by mouth Daily    [provider]  ferrous sulfate  325 (65 FE) MG tablet Take 1 tablet (325 mg total) by mouth every Monday, Wednesday, and Friday. In the morning Patient not taking: Reported on 07/03/2023 04/07/23   Bensimhon, Rheta Celestine, MD  losartan  (COZAAR ) 25 MG tablet Take 0.5 tablets (12.5 mg total) by mouth daily. 07/03/23 10/01/23  Elmarie Hacking, FNP  melatonin 3 MG TABS tablet Take 6 mg by mouth at bedtime as needed (sleep). 02/03/22   [provider]  mexiletine (MEXITIL ) 200 MG capsule Take 1 capsule (200 mg total) by mouth every 12 (twelve) hours. 01/30/23   Bensimhon, Rheta Celestine, MD  omeprazole  (PRILOSEC) 20 MG capsule Take 1 capsule (20 mg total) by mouth daily. Patient not taking: Reported on 07/03/2023 01/30/23   Bensimhon, Daniel R, MD  polyethylene glycol (MIRALAX  / GLYCOLAX ) 17 g packet Take 17 g by mouth daily as needed (constipation.). 07/14/23   Audria Leather, MD  spironolactone  (ALDACTONE ) 25 MG tablet Take 1 tablet (25 mg total) by mouth daily. 07/14/23   Audria Leather, MD  tamsulosin  (FLOMAX ) 0.4 MG CAPS capsule Take 1 capsule (0.4 mg total) by mouth daily after supper. 07/14/23   Audria Leather, MD  torsemide  (DEMADEX ) 20 MG tablet Take 1 tablet (20 mg total) by mouth daily. 07/23/23 08/22/23  Arvilla Birmingham, MD  Allergies    Lisinopril     Review of Systems   Review of Systems  Physical Exam Updated Vital Signs BP 121/89   Pulse 70   Temp 98 F (36.7 C)   Resp (!) 22   Ht 6\' 4"  (1.93 m)   Wt 92.1 kg   SpO2 100%   BMI 24.72 kg/m  Physical Exam Constitutional:      General: He is not in acute distress. HENT:     Head: Normocephalic and atraumatic.  Eyes:     Conjunctiva/sclera: Conjunctivae normal.     Pupils: Pupils are equal, round, and reactive to light.  Cardiovascular:     Rate and Rhythm: Normal rate and regular rhythm.  Pulmonary:      Effort: Pulmonary effort is normal. No respiratory distress.  Abdominal:     General: There is no distension.     Tenderness: There is no abdominal tenderness.  Skin:    General: Skin is warm and dry.  Neurological:     General: No focal deficit present.     Mental Status: He is alert. Mental status is at baseline.  Psychiatric:        Mood and Affect: Mood normal.        Behavior: Behavior normal.     ED Results / Procedures / Treatments   Labs (all labs ordered are listed, but only abnormal results are displayed) Labs Reviewed  COMPREHENSIVE METABOLIC PANEL WITH GFR - Abnormal; Notable for the following components:      Result Value   CO2 21 (*)    Glucose, Bld 134 (*)    Creatinine, Ser 1.54 (*)    Calcium 8.7 (*)    Total Protein 5.7 (*)    Albumin 3.3 (*)    GFR, Estimated 49 (*)    All other components within normal limits  CBC - Abnormal; Notable for the following components:   RBC 4.09 (*)    Hemoglobin 9.2 (*)    HCT 32.8 (*)    MCH 22.5 (*)    MCHC 28.0 (*)    RDW 23.7 (*)    All other components within normal limits  BRAIN NATRIURETIC PEPTIDE - Abnormal; Notable for the following components:   B Natriuretic Peptide 788.5 (*)    All other components within normal limits  TROPONIN I (HIGH SENSITIVITY) - Abnormal; Notable for the following components:   Troponin I (High Sensitivity) 29 (*)    All other components within normal limits  LIPASE, BLOOD  URINALYSIS, ROUTINE W REFLEX MICROSCOPIC  TROPONIN I (HIGH SENSITIVITY)    EKG EKG Interpretation Date/Time:  Sunday July 23 2023 18:01:53 EDT Ventricular Rate:  72 PR Interval:    QRS Duration:  130 QT Interval:  438 QTC Calculation: 479 R Axis:   -66  Text Interpretation: Ventricular-paced rhythm Biventricular pacemaker detected Abnormal ECG When compared with ECG of 11-Jul-2023 21:24, PREVIOUS ECG IS PRESENT Confirmed by Jerald Molly [phone]) on 07/23/2023 8:10:51 PM  Radiology DG Chest 2  View Result Date: 07/23/2023 CLINICAL DATA:  Shortness of breath EXAM: CHEST - 2 VIEW COMPARISON:  07/11/2023 FINDINGS: Unchanged position of left chest wall AICD. Small right pleural effusion with basilar atelectasis. Mild cardiomegaly. IMPRESSION: Small right pleural effusion with basilar atelectasis. Electronically Signed   By: Juanetta Nordmann M.D.   On: 07/23/2023 20:39    Procedures Procedures    Medications Ordered in ED Medications  furosemide  (LASIX ) injection 60 mg (60 mg Intravenous Given 07/23/23 2043)  ED Course/ Medical Decision Making/ A&P                                 Medical Decision Making Amount and/or Complexity of Data Reviewed Labs: ordered.  Risk Prescription drug management.   This patient presents to the ED with concern for generalized abdominal discomfort, shortness of breath. This involves an extensive number of treatment options, and is a complaint that carries with it a high risk of complications and morbidity.  The differential diagnosis includes congestive heart failure versus ascites versus intra-abdominal process versus other  Co-morbidities that complicate the patient evaluation: History of significant heart failure risk and pulmonary edema   External records from outside source obtained and reviewed including hospital discharge summary from 10 days ago  I ordered and personally interpreted labs.  The pertinent results include: Troponins low, flat, hemoglobin near baseline level 9.2.  No leukocytosis.  Abdominal labs largely unremarkable.  Lipase normal.  BNP is improved from his hospitalization 2 weeks ago but appears to be trending up from his baseline levels  I ordered imaging studies including the chest I independently visualized and interpreted imaging which showed a pleural effusion and bibasilar atelectasis, I agree with the radiologist interpretation  The patient was maintained on a cardiac monitor.  I personally viewed and interpreted the  cardiac monitored which showed an underlying rhythm of: Paced rhythm  Per my interpretation the patient's ECG shows ICD paced rhythm  I ordered medication including IV Lasix  for diuresis for shortness of breath  I have reviewed the patients home medicines and have made adjustments as needed  Test Considered: Denies indication for repeat CT imaging of the abdomen.  Of a low suspicion for acute intra-abdominal telemetry infectious etiology.  Doubt AAA.  Doubt acute PE.   After the interventions noted above, I reevaluated the patient and found that they have: improved  Patient has a strong desire to go home.  I strongly suspect that his symptoms are related to congestive heart failure, fluid retention of the abdomen, which is some anasarca as well as on the lungs.  He was prescribed Bumex at the time of discharge but does not appear to feel that or does not have it present and his supply of medications at the bedside.  For think he is missing a diuretic that he need to be returned on.  We can restart his diuretic and he is very close follow-up in 2 days with the cardiology clinic, which to be reasonable.  At this time we are awaiting a UA.  His postvoid residual was minimal at 100 cc, and he does not appear to be retaining any significant amount of urine.  He is not hypoxic.  Patient is signed out to Dr Ranelle Buys EDP pending follow up on UA and ambulatory pulse ox.  If feeling better could be discharged on his prescribed bumex with emphasis to fill the script, and cards follow up on Tuesday.        Final Clinical Impression(s) / ED Diagnoses Final diagnoses:  Shortness of breath    Rx / DC Orders ED Discharge Orders          Ordered    torsemide  (DEMADEX ) 20 MG tablet  Daily       Note to Pharmacy: Please cancel all previous orders for current medication. Change in dosage or pill size.   07/23/23 2118  Arvilla Birmingham, MD 07/23/23 2119

## 2023-07-23 NOTE — ED Triage Notes (Addendum)
 Pt reports SHOB, abd pain, and trouble emptying bladder x3 days. Pt states that the [facility] wakes him from sleep. Pt has been out of his fluid pills for 1 month.

## 2023-07-23 NOTE — ED Notes (Signed)
Unable to locate the pt 

## 2023-07-23 NOTE — ED Provider Notes (Signed)
 Patient signed out to myself by Jerald Molly, MD pending urinary analysis and ambulatory pulse ox.  Please refer to their note for full HPI, ROS, PE and MDM.   Patient with a past medical history of CHF with an EF of 20%, A-fib on Eliquis , CKD, hypertension, COPD, and BPH presents today for worsening shortness of breath and abdominal pain.  Patient was discharged approximately 10 days ago for similar.  He was also found to be in urinary retention.  Patient discharged on spironolactone , digoxin , and torsemide  however patient is not currently taking torsemide .  Wiating UA and ambu pulse ox Worsening SHOB Hx urine retention, make sure no UTI Physical Exam  BP 121/89   Pulse 70   Temp 98 F (36.7 C)   Resp (!) 22   Ht 6\' 4"  (1.93 m)   Wt 92.1 kg   SpO2 100%   BMI 24.72 kg/m   Physical Exam Vitals and nursing note reviewed.  HENT:     Head: Normocephalic and atraumatic.  Eyes:     Extraocular Movements: Extraocular movements intact.  Cardiovascular:     Rate and Rhythm: Normal rate.     Heart sounds: Normal heart sounds.  Pulmonary:     Effort: Pulmonary effort is normal. No respiratory distress.  Abdominal:     General: Bowel sounds are normal.     Palpations: Abdomen is soft.  Skin:    General: Skin is warm and dry.     Capillary Refill: Capillary refill takes less than 2 seconds.  Neurological:     General: No focal deficit present.     Mental Status: He is alert.  Psychiatric:        Mood and Affect: Mood normal.    Procedures  Procedures  ED Course / MDM    Medical Decision Making Amount and/or Complexity of Data Reviewed Labs: ordered.  Risk Prescription drug management.   UA with 150 glucose  Patient able to ambulate on pulse ox and maintain SpO2 of 98% or greater.  Considered for mission further workup however patient's vital signs, physical exam, labs, and imaging of been reassuring.  Patient's symptoms likely due to acute CHF exacerbation.  Patient  diuresed while in ED and discharged on torsemide .  Patient to follow-up with cardiology in 2 days.  Patient given return precautions.  I feel patient is safe for discharge at this time.     Carie Charity, PA-C 07/23/23 2317    Sallyanne Creamer, DO 07/25/23 1801

## 2023-07-23 NOTE — ED Notes (Signed)
 Total urine output 

## 2023-07-23 NOTE — ED Notes (Signed)
 The pt reports some sob for 2-3 days  his sats here are 98-100 on room air

## 2023-07-23 NOTE — ED Notes (Signed)
Pt not here

## 2023-07-24 ENCOUNTER — Other Ambulatory Visit (HOSPITAL_COMMUNITY)

## 2023-07-24 ENCOUNTER — Telehealth (HOSPITAL_COMMUNITY): Payer: Self-pay

## 2023-07-24 NOTE — Telephone Encounter (Signed)
 Called to confirm/remind patient of their appointment [facility] on 07/25/2023 8:30.   Appointment:   [] Confirmed  [x] Left mess   [] No answer/No voice mail  [] VM Full/unable to leave message  [] Phone not in service  Patient reminded to bring all medications and/or complete list.  Confirmed patient has transportation. Gave directions, instructed to utilize valet parking.

## 2023-07-24 NOTE — Progress Notes (Incomplete)
 ADVANCED HF CLINIC NOTE   Primary Care: Madonna Schlein, NP HF Cardiologist: Dr. Julane Ny  HPI: Austin [name] is a 68 y.o. old with a history of chronic HFrEF, HTN, HLD, nonobstructive CAD 2012, COPD, CKD Stage III, PAF, and single chamber Biotronik .    EF initially down 2012 to 20%. EF improved in 2013 to 50-55%.    Cath (2015): LCX 20% OMI, otherwise ok.  Echo (2022): EF 25-30% RV normal.  Echo (03/2022): EF 15-20% RV normal.    Admitted to [facility] Jan, Feb, March, April, and May of 2024 with A/C HFrEF. Noted to be in SR in March 2024.  Admitted 6/24 with a/c HF and AF. Echo showed EF < 20% RV normal. R/LHC showed minimal CAD, severe NICM EF 10%, mildly elevated filling pressures and severely reduced CI 1.9. AF was rate controlled, with frequent PVCs. Chemically converted to NSR on amio. Plan to repeat echo in 4-6 weeks after GDMT and PVC suppression, with eye toward LVAD if EF not improved.   Admitted in 11/24 with cardiogenic shock. Echo 11/24 EF < 20%. Treated with milrinone .  EF Device interrogation showed that he developed AV block in 10/24 and RV pacing went up > 70%. The long AV delay causing frequent paced beats and dropped beats as well. Underwent upgrade to CRT device with RA lead as well    Echo 3/25 EF 20-25% RV ok.  In rate controlled AF 3/25, underwent DCCV to NSR 05/19/23.   Seen in HF clinic 05/19. Entresto  stopped d/t hypotension/fall at home and losartan  started at 12.5 mg daily.    He was admitted with shortness of breath with respiratory distress, abdominal pain and volume overload on 07/11/23. Initially required BiPAP. Treated empirically for PNA. He was diuresed with IV lasix . He had CTA which showed markedly enlarged prostate. He required foley placement. Suspect volume overload was driven by urinary retention. He was started on flomax .   Seen in ED again 06/08 with abdominal pain and dyspnea. Presentation appeared consistent with CHF. Given 60 mg lasix  IV and  prescribed 20 mg Torsemide  daily (had not been taking).  Here today for hospital follow-up.  Cardiac Studies - Echo 3/25 EF 20-25% RV ok   - TEE (7/24): LVEF < 20%, global HK, RV moderate HK, mild to moderate MR with posterior leaflet retracted, moderate TR, no LAA thrombus - R/LHC (6/24): minimal CAD, severe NICM EF 10% RA 9, PA 34/19 ( 27), PCWP 20 (v waves to 30), CO/CI (thermo) 4.3/1.9, PVR 1.6 - Echo (6/24): EF < 20%, echodensity on device in RV, RV moderately down, mild MR, ascending aorta 40 mm - Echo (2/24): EF 15-20% RV normal.  - Echo (2022): EF 25-30% RV normal.  - Cath (2015): LCX 20% OMI, otherwise ok.  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   Single Chamber Biotronik   Myocardial infarction [facility]) 2012   12/31/2013   Nonischemic cardiomyopathy (HCC) 11/15/2010   cath 12/10/10 minimal nonobstructive coronary artery disease   Olecranon  bursitis    Squamous cell carcinoma of arm, 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     apixaban  (ELIQUIS ) 5 MG TABS tablet Take 1 tablet (5 mg total) by mouth 2 (two) times daily. Start taking on 01/17/23 pm     digoxin  (LANOXIN ) 0.125 MG tablet Take 1 tablet  (0.125 mg total) by mouth daily. 90 tablet 3   fenofibrate  (TRICOR ) 48 MG tablet Take 2 tablets by mouth Daily     ferrous sulfate  325 (65 FE) MG tablet Take 1 tablet (325 mg total) by mouth every Monday, Wednesday, and Friday. In the morning (Patient not taking: Reported on 07/03/2023) 30 tablet 11   losartan  (COZAAR ) 25 MG tablet Take 0.5 tablets (12.5 mg total) by mouth daily. 8 tablet 0   melatonin 3 MG TABS tablet Take 6 mg by mouth at bedtime as needed (sleep).     mexiletine (MEXITIL ) 200 MG capsule Take 1 capsule (200 mg total) by mouth every 12 (twelve) hours. 60 capsule 3   omeprazole  (PRILOSEC) 20 MG capsule Take 1 capsule (20 mg total) by mouth daily. (Patient not taking: Reported on 07/03/2023) 90 capsule 3   polyethylene glycol (MIRALAX  / GLYCOLAX ) 17 g packet Take 17 g by mouth daily as needed (constipation.).     spironolactone  (ALDACTONE ) 25 MG tablet Take 1 tablet (25 mg total) by mouth daily. 30 tablet 0   tamsulosin  (FLOMAX ) 0.4 MG CAPS capsule Take 1 capsule (0.4 mg total) by mouth daily after supper. 30 capsule 0   torsemide  (DEMADEX ) 20 MG tablet Take 1 tablet (20 mg total) by mouth daily. 30 tablet 3   No current facility-administered medications for this visit.   Allergies  Allergen Reactions   Lisinopril  Cough   Social History   Socioeconomic History   Marital status: Single    Spouse name: Not on file   Number of children: Not on file   Years of education: Not on file   Highest education level: Not on file  Occupational History   Not on file  Tobacco Use   Smoking status: Former    Current packs/day: 0.00    Average packs/day: 2.0 packs/day for 14.0 years (28.0 ttl pk-yrs)    Types: Cigarettes    Start date: 02/15/1979    Quit date: 02/14/1993    Years since quitting: 30.4   Smokeless tobacco: Not on file   Tobacco comments:    Former smoker 03/20/23  Substance and Sexual Activity   Alcohol use: Yes    Alcohol/week: 3.0 standard drinks of alcohol     Types: 1 Cans of beer, 2 Shots of liquor per week    Comment: once a month a beer or couple shots of bourbon 03/20/23   Drug use: Not Currently    Types: Marijuana    Comment: occ smokes marjuana 03/20/23   Sexual activity: Not on file  Other Topics Concern   Not on file  Social History Narrative   He is single and lives in [place].  He drives a refrigerator truck.   Social Drivers of Health   Financial Resource Strain: Patient Declined (03/02/2023)   Received from  [facility]   Overall Financial Resource Strain (CARDIA)    Difficulty of Paying Living Expenses: Patient declined  Food Insecurity: No Food Insecurity (07/12/2023)   Hunger Vital Sign    Worried About Running Out of Food in the Last Year: Never true    Ran Out of Food in the Last Year: Never true  Transportation Needs: No Transportation Needs (07/12/2023)   PRAPARE - Administrator, Civil Service (Medical): No    Lack of Transportation (Non-Medical): No  Physical Activity: Sufficiently Active (11/01/2022)   Received from [facility]   Exercise Vital Sign    Days of Exercise per Week: 3 days    Minutes of Exercise per Session: 150+ min  Stress: No Stress Concern Present (11/01/2022)   Received from [facility] - Occupational Stress Questionnaire    Feeling of Stress : Not at all  Social Connections: Socially Isolated (07/12/2023)   Social Connection and Isolation Panel [NHANES]    Frequency of Communication with Friends and Family: More than three times a week    Frequency of Social Gatherings with Friends and Family: Twice a week    Attends Religious Services: Never    Database administrator or Organizations: No    Attends Banker Meetings: Never    Marital Status: Never married  Intimate Partner Violence: Not At Risk (07/12/2023)   Humiliation, Afraid, Rape, and Kick questionnaire    Fear of Current or Ex-Partner: No    Emotionally Abused:  No    Physically Abused: No    Sexually Abused: No   Family History  Problem Relation Age of Onset   Breast cancer Mother    Cancer Father    Heart attack Brother    Heart attack Brother 42   There were no vitals taken for this visit.  Wt Readings from Last 3 Encounters:  07/23/23 92.1 kg (203 lb 0.7 oz)  07/14/23 92.1 kg (203 lb 0.7 oz)  07/03/23 96.1 kg (211 lb 12.8 oz)   PHYSICAL EXAM: General:  NAD. No resp difficulty, walked into clinic HEENT: Normal Neck: Supple. No JVD. Cor: Regular rate & rhythm. No rubs, gallops or murmurs. Lungs: Clear Abdomen: Soft, nontender, nondistended.  Extremities: No cyanosis, clubbing, rash, edema Neuro: Alert & oriented x 3, moves all 4 extremities w/o difficulty. Affect pleasant.  ECG (personally reviewed): A sensed V paced with PVCs  ASSESSMENT & PLAN: 1. Chronic Systolic Heart Failure -> cardiogenic shock - NICM  - Initially diagnosed with HFrEF in 2012. EF previously improved to 50-55% on medications.  - Echo (6/24): EF now < 20%. He does have single chamber Biotronik.  - R/LHC (7/24) w/ minimal CAD, mildly elevated filling pressures and severely reduced CO/CI by thermo 4.3/1.9.  - Echo 11/24 EF < 20% -> shock - ICD interrogated 11/24 ->he developed AV block in 10/24 and RV pacing went up > 70% reculting in SR with long AV delay causing frequent paced beats and dropped beats as well.  - CRT upgrade 11/24 with a placement of an atrial lead  - Echo 05/08/23 LV markedly dilated EF 20-25% RV ok  - Echo 05/25: EF 20%, RV okay - NYHA ***.  - Continue Torsemide  20 mg daily - Continue losartan  12.5 mg daily - Stopped Jardiance  during recent admit d/t urinary retention - Continue spiro 25 mg daily - Off beta blocker with history of low-output - Worry that he may eventually need advanced  therapies, most likely LVAD. EF remains severely reduced despite BiV pacing.  2. Paroxysmal A fib  - s/p DCCV to NSR 4/25 - Recurrent AF ~ 6 hrs on  05/25. No recurrence since - Continue Eliquis  5 mg bid   3. CKD Stage IIIa - Baseline SCr 1.4-1.7 - Jardiance  recently stopped as above - Labs today   4. ?RV Lead vegetation - possible that this is due to the "curl-i-cue" that the lead forms in the right heart, as noted on CXR.  - No vegetation on TEE 7/24   5. PVCs - High PVC burden. ~ 50% during prior admission.  - Continue mexilitene 200 bid.  - Follows with EP  6. Iron  deficiency anemia - Hgb 9s-10s - Received IV iron  in 05/25  Follow up   Austin [name] N, PA-C  2:06 PM

## 2023-07-25 ENCOUNTER — Encounter (HOSPITAL_COMMUNITY): Payer: Self-pay

## 2023-07-25 ENCOUNTER — Ambulatory Visit (HOSPITAL_COMMUNITY)
Admit: 2023-07-25 | Discharge: 2023-07-25 | Disposition: A | Discharge status: Home / Self Care | Source: Ambulatory Visit | Attending: Physician Assistant | Admitting: Physician Assistant

## 2023-07-25 VITALS — BP 124/95 | HR 80 | Ht 76.0 in | Wt 216.0 lb

## 2023-07-25 DIAGNOSIS — I5022 Chronic systolic (congestive) heart failure: Secondary | ICD-10-CM

## 2023-07-25 DIAGNOSIS — D509 Iron deficiency anemia, unspecified: Secondary | ICD-10-CM | POA: Insufficient documentation

## 2023-07-25 DIAGNOSIS — I130 Hypertensive heart and chronic kidney disease with heart failure and stage 1 through stage 4 chronic kidney disease, or unspecified chronic kidney disease: Secondary | ICD-10-CM | POA: Diagnosis present

## 2023-07-25 DIAGNOSIS — Z7901 Long term (current) use of anticoagulants: Secondary | ICD-10-CM | POA: Diagnosis not present

## 2023-07-25 DIAGNOSIS — Z87891 Personal history of nicotine dependence: Secondary | ICD-10-CM | POA: Insufficient documentation

## 2023-07-25 DIAGNOSIS — D631 Anemia in chronic kidney disease: Secondary | ICD-10-CM | POA: Insufficient documentation

## 2023-07-25 DIAGNOSIS — N183 Chronic kidney disease, stage 3 unspecified: Secondary | ICD-10-CM | POA: Diagnosis present

## 2023-07-25 DIAGNOSIS — N1831 Chronic kidney disease, stage 3a: Secondary | ICD-10-CM

## 2023-07-25 DIAGNOSIS — I493 Ventricular premature depolarization: Secondary | ICD-10-CM

## 2023-07-25 DIAGNOSIS — Z95810 Presence of automatic (implantable) cardiac defibrillator: Secondary | ICD-10-CM | POA: Insufficient documentation

## 2023-07-25 DIAGNOSIS — J449 Chronic obstructive pulmonary disease, unspecified: Secondary | ICD-10-CM | POA: Insufficient documentation

## 2023-07-25 DIAGNOSIS — E785 Hyperlipidemia, unspecified: Secondary | ICD-10-CM | POA: Insufficient documentation

## 2023-07-25 DIAGNOSIS — I428 Other cardiomyopathies: Secondary | ICD-10-CM

## 2023-07-25 DIAGNOSIS — I480 Paroxysmal atrial fibrillation: Secondary | ICD-10-CM | POA: Diagnosis not present

## 2023-07-25 DIAGNOSIS — I2510 Atherosclerotic heart disease of native coronary artery without angina pectoris: Secondary | ICD-10-CM | POA: Diagnosis not present

## 2023-07-25 MED ORDER — POTASSIUM CHLORIDE CRYS ER 20 MEQ PO TBCR: 40.0000 meq | EXTENDED_RELEASE_TABLET | Freq: Every day | ORAL | 3 refills | Status: AC

## 2023-07-25 MED ORDER — LOSARTAN POTASSIUM 25 MG PO TABS: 25.0000 mg | ORAL_TABLET | Freq: Every day | ORAL | 3 refills | Status: AC

## 2023-07-25 MED ORDER — TORSEMIDE 20 MG PO TABS: 20.0000 mg | ORAL_TABLET | Freq: Every day | ORAL | 3 refills | Status: AC

## 2023-07-25 MED ORDER — DIGOXIN 125 MCG PO TABS: 0.1250 mg | ORAL_TABLET | Freq: Every day | ORAL | 3 refills | Status: AC

## 2023-07-25 NOTE — Patient Instructions (Signed)
 CHANGE Losartan  to 25 mg ( 1 Tab) daily.  START Potassium 2 tablets daily.  TAKE Torsemide  20 mg ( 1 Tab) every morning.  Blood work in 2 weeks.  Your physician recommends that you schedule a follow-up appointment in: 2 months.  If you have any questions or concerns before your next appointment please send us  a message through [place] or call our office at [phone].    TO LEAVE A MESSAGE FOR THE NURSE SELECT OPTION 2, PLEASE LEAVE A MESSAGE INCLUDING: YOUR NAME DATE OF BIRTH CALL BACK NUMBER REASON FOR CALL**this is important as we prioritize the call backs  YOU WILL RECEIVE A CALL BACK THE SAME DAY AS LONG AS YOU CALL BEFORE 4:00 PM  At the [facility], you and your health needs are our priority. As part of our continuing mission to provide you with exceptional heart care, we have created designated Provider Care Teams. These Care Teams include your primary Cardiologist (physician) and Advanced Practice Providers (APPs- Physician Assistants and Nurse Practitioners) who all work together to provide you with the care you need, when you need it.   You may see any of the following providers on your designated Care Team at your next follow up: Dr Jules Oar Dr Peder Bourdon Dr. Alwin Baars Dr. Arta Lark Amy Marijane Shoulders, NP Ruddy Corral, [place] [facility] [place], [place] Dennise Fitz, NP [place] Lee, NP Shawnee Dellen, NP Luster Salters, PharmD Bevely Brush, PharmD   Please be sure to bring in all your medications bottles to every appointment.    Thank you for choosing [facility]

## 2023-07-25 NOTE — Addendum Note (Signed)
 Encounter addended by: Dewey Fordyce, CMA on: 07/25/2023 2:06 PM  Actions taken: Order list changed, Diagnosis association updated, Flowsheet accepted, Clinical Note Signed, Charge Capture section accepted

## 2023-07-25 NOTE — Progress Notes (Signed)
 ReDS Vest / Clip - 07/25/23 1400       ReDS Vest / Clip   Station Marker D    Ruler Value 28    ReDS Value Range Moderate volume overload    ReDS Actual Value 40

## 2023-08-08 ENCOUNTER — Ambulatory Visit (HOSPITAL_COMMUNITY): Payer: Self-pay | Admitting: Physician Assistant

## 2023-08-08 ENCOUNTER — Ambulatory Visit (HOSPITAL_COMMUNITY)
Admission: RE | Admit: 2023-08-08 | Discharge: 2023-08-08 | Disposition: A | Discharge status: Home / Self Care | Source: Ambulatory Visit | Attending: Internal Medicine | Admitting: Internal Medicine

## 2023-08-08 DIAGNOSIS — I5022 Chronic systolic (congestive) heart failure: Secondary | ICD-10-CM | POA: Diagnosis present

## 2023-08-08 LAB — BASIC METABOLIC PANEL WITH GFR
Anion gap: 11 (ref 5–15)
BUN: 15 mg/dL (ref 8–23)
CO2: 24 mmol/L (ref 22–32)
Calcium: 9.7 mg/dL (ref 8.9–10.3)
Chloride: 106 mmol/L (ref 98–111)
Creatinine, Ser: 1.72 mg/dL — ABNORMAL HIGH (ref 0.61–1.24)
GFR, Estimated: 42 mL/min — ABNORMAL LOW (ref >60)
Glucose, Bld: 132 mg/dL — ABNORMAL HIGH (ref 70–99)
Potassium: 4.1 mmol/L (ref 3.5–5.1)
Sodium: 141 mmol/L (ref 135–145)

## 2023-08-25 NOTE — Progress Notes (Unsigned)
 Cardiology Office Note:  .   Date:  08/25/2023  ID:  Austin [name], DOB 01/07/1956, MRN [id number] PCP: Austin Lauraine FORBES, NP  [facility] Providers Cardiologist:  Toribio Fuel, MD { EP: Dr. Fernande  History of Present Illness: .   Austin [name] is a 68 y.o. male w/PMHx of  HTN, HLD, COPD, CKD (IIIa) CAD (June 2024, minimal CAD by cath) NICM AFib PVCs  EF initially down 2012 to 20%. EF improved in 2013 to 50-55%.  Cath (2015): LCX 20% OMI, otherwise ok.  Echo (2022): EF 25-30% RV normal.  Echo (03/2022): EF 15-20% RV normal.    Admitted 6/24 with a/c HF and AF.  Echo showed EF < 20% RV normal. R/LHC showed minimal CAD, severe NICM EF 10%, mildly elevated filling pressures and severely reduced CI 1.9.  AF was rate controlled, with frequent PVCs. Chemically converted to NSR on amio.  Plan to repeat echo in 4-6 weeks after GDMT and PVC suppression, with eye toward LVAD if EF not improved.   Admitted in 11/24 with cardiogenic shock.  Echo 11/24 EF < 20%. Treated with milrinone .   Device interrogation showed that he developed AV block in 10/24 and RV pacing went up > 70%. The long AV delay causing frequent paced beats and dropped beats as well. Underwent upgrade to CRT device with RA lead as well   Subsequent hospital say/ER visits SOB, treated for PNA, given diuretics in environment of skipping doses...  He saw the AHF team 07/25/22, doing better, able to walk 1/2 mile without difficulty, reported medication compliance. LVEF remained low, planned for EP visit to see if device optimization could be done with QRS Some concerns he despite reporting good medication compliance, he also was not aware of what his meds were, so wondered if actually taking them all appropriately Might eventually need advanced therapies   Today's visit is scheduled as a 4 mo visit ROS:   *** RBBB? *** device only *** can try to optimize   Device information Biotronik single  chamber ICD implanted 09/21/20 > upgrade to CRT-D 01/13/23 LV lead is an abbott wire  Arrhythmia/AAD hx Amiodarone  > appears to have been stopped ~ stopped Nov 2024 Mexiletine for PVCs started Nov 2024  Studies Reviewed: SABRA    EKG done today and reviewed by myself:  ***  07/25/23: SR, V paced, RBBB 01/05/23 (pre-upgrade) is RV paced (RV apex) 11/07/22: SR w/AV conduction QRS IVCD 172ms/RBBB   DEVICE interrogation done today and reviewed by myself *** Battery and lead measurements are good ***   Echo 3/25 EF 20-25% RV ok   - TEE (7/24): LVEF < 20%, global HK, RV moderate HK, mild to moderate MR with posterior leaflet retracted, moderate TR, no LAA thrombus - R/LHC (6/24): minimal CAD, severe NICM EF 10% RA 9, PA 34/19 ( 27), PCWP 20 (v waves to 30), CO/CI (thermo) 4.3/1.9, PVR 1.6 - Echo (6/24): EF < 20%, echodensity on device in RV, RV moderately down, mild MR, ascending aorta 40 mm - Echo (2/24): EF 15-20% RV normal.  - Echo (2022): EF 25-30% RV normal.  - Cath (2015): LCX 20% OMI, otherwise ok.  - Echo (2013): EF 50-55% - Echo (2012): EF 20%   Risk Assessment/Calculations:    Physical Exam:   VS:  There were no vitals taken for this visit.   Wt Readings from Last 3 Encounters:  07/25/23 216 lb (98 kg)  07/23/23 203 lb 0.7 oz (92.1  kg)  07/14/23 203 lb 0.7 oz (92.1 kg)    GEN: Well nourished, well developed in no acute distress NECK: No JVD; No carotid bruits CARDIAC: ***RRR, no murmurs, rubs, gallops RESPIRATORY:  *** CTA b/l without rales, wheezing or rhonchi  ABDOMEN: Soft, non-tender, non-distended EXTREMITIES:  No edema; No deformity   PPM/ICD/ILR site: *** is stable, no thinning, fluctuation, tethering  ASSESSMENT AND PLAN: .    *** AFib *** Secondary hypercoagulable state 2/2 AFib     {Are you ordering a CV Procedure (e.g. stress test, cath, DCCV, TEE, etc)?   Press F2        :[id number]}     Dispo: ***  Signed, Austin Macario Arthur, PA-C

## 2023-08-28 ENCOUNTER — Ambulatory Visit: Attending: Physician Assistant | Admitting: Physician Assistant

## 2023-08-28 ENCOUNTER — Encounter: Payer: Self-pay | Admitting: Physician Assistant

## 2023-08-28 VITALS — BP 98/56 | HR 68 | Ht 76.0 in | Wt 211.7 lb

## 2023-08-28 DIAGNOSIS — I428 Other cardiomyopathies: Secondary | ICD-10-CM | POA: Diagnosis not present

## 2023-08-28 DIAGNOSIS — Z95810 Presence of automatic (implantable) cardiac defibrillator: Secondary | ICD-10-CM

## 2023-08-28 DIAGNOSIS — I5022 Chronic systolic (congestive) heart failure: Secondary | ICD-10-CM

## 2023-08-28 DIAGNOSIS — I493 Ventricular premature depolarization: Secondary | ICD-10-CM

## 2023-08-28 DIAGNOSIS — I4819 Other persistent atrial fibrillation: Secondary | ICD-10-CM

## 2023-08-28 DIAGNOSIS — D6869 Other thrombophilia: Secondary | ICD-10-CM

## 2023-08-28 LAB — CUP PACEART INCLINIC DEVICE CHECK
Date Time Interrogation Session: 20250714172004
Implantable Lead Connection Status: 753985
Implantable Lead Connection Status: 753985
Implantable Lead Connection Status: 753985
Implantable Lead Implant Date: 20220808
Implantable Lead Implant Date: 20241129
Implantable Lead Implant Date: 20241129
Implantable Lead Location: 753858
Implantable Lead Location: 753859
Implantable Lead Location: 753860
Implantable Lead Model: 436910
Implantable Lead Model: 5076
Implantable Lead Serial Number: 81450996
Implantable Pulse Generator Implant Date: 20241129
Pulse Gen Model: 429522
Pulse Gen Serial Number: 84968923

## 2023-08-28 NOTE — Patient Instructions (Signed)
 Medication Instructions:   Your physician recommends that you continue on your current medications as directed. Please refer to the Current Medication list given to you today.   *If you need a refill on your cardiac medications before your next appointment, please call your pharmacy*    Lab Work:  NONE ORDERED  TODAY    If you have labs (blood work) drawn today and your tests are completely normal, you will receive your results only by: MyChart Message (if you have MyChart) OR A paper copy in the mail If you have any lab test that is abnormal or we need to change your treatment, we will call you to review the results.   Testing/Procedures: NONE ORDERED  TODAY      Follow-Up: At [facility], you and your health needs are our priority.  As part of our continuing mission to provide you with exceptional heart care, our providers are all part of one team.  This team includes your primary Cardiologist (physician) and Advanced Practice Providers or APPs (Physician Assistants and Nurse Practitioners) who all work together to provide you with the care you need, when you need it.   Your next appointment:    2 -3 month(s)   Provider:    Charlies Arthur, PA-C ( CONTACT  CASSIE HALL/ ANGELINE HAMMER FOR EP SCHEDULING ISSUES )    We recommend signing up for the patient portal called MyChart.  Sign up information is provided on this After Visit Summary.  MyChart is used to connect with patients for Virtual Visits (Telemedicine).  Patients are able to view lab/test results, encounter notes, upcoming appointments, etc.  Non-urgent messages can be sent to your provider as well.   To learn more about what you can do with MyChart, go to [email or web address].   Other Instructions

## 2023-09-04 ENCOUNTER — Telehealth (HOSPITAL_COMMUNITY): Payer: Self-pay | Admitting: *Deleted

## 2023-09-04 NOTE — Telephone Encounter (Signed)
 Needs appt this week per Dr Cherrie, attempted to call pt and Left message to call back

## 2023-09-04 NOTE — Telephone Encounter (Signed)
-----   Message from Charlies Macario Arthur sent at 08/31/2023  2:37 PM EDT ----- He told me he was good about taking all of his medicines, but since he declined making any change/plans I did not have the very deliberate have you missed any doses of your blood thinner chat. Looks like at a hospital stay the amio got stopped > mexiletine  Renee ----- Message ----- From: Bensimhon, Daniel R, MD Sent: 08/30/2023   5:26 PM EDT To: Powell CHRISTELLA Latino, RN; Charlies Macario Arthur, PA-C  Agree with amio (I thought he was on it??ALLIE Powell -can we work him in soon please ----- Message ----- From: Arthur Charlies Macario, PA-C Sent: 08/28/2023   5:19 PM EDT To: Toribio JONELLE Fuel, MD  I saw this patient today, some EKG optimization today getting some improvement in his QRS. He is back in Afib has been ~ mid June, rate controlled 99%BP He had DCCV in April I suggested today that we would need a medication (amiodarone , probably instead of his mexiletine on for PVCs) to hold SR and plan DCCV but he wanted no changes/plans unless you were involved/he preferred to try and move his office visit with you up sooner if possible and discuss further. He reported feeling GREAT, but think it wouldn't take much to run into HF trouble with him. I am happy to make any changes/plans you'd like

## 2023-09-06 NOTE — Telephone Encounter (Signed)
 Left message to call back

## 2023-09-07 NOTE — Telephone Encounter (Signed)
 Appt sch 7/28

## 2023-09-08 ENCOUNTER — Telehealth (HOSPITAL_COMMUNITY): Payer: Self-pay

## 2023-09-08 NOTE — Telephone Encounter (Signed)
 Called to confirm/remind patient of their appointment at the [facility] on 09/11/2023 2:00.   Appointment:   [] Confirmed  [x] Left mess   [] No answer/No voice mail  [] VM Full/unable to leave message  [] Phone not in service  Patient reminded to bring all medications and/or complete list.  Confirmed patient has transportation. Gave directions, instructed to utilize valet parking.

## 2023-09-11 ENCOUNTER — Ambulatory Visit (HOSPITAL_COMMUNITY): Payer: Self-pay | Admitting: Cardiology

## 2023-09-11 ENCOUNTER — Encounter (HOSPITAL_COMMUNITY): Payer: Self-pay

## 2023-09-11 ENCOUNTER — Ambulatory Visit (HOSPITAL_COMMUNITY)
Admission: RE | Admit: 2023-09-11 | Discharge: 2023-09-11 | Disposition: A | Discharge status: Home / Self Care | Source: Ambulatory Visit | Attending: Cardiology | Admitting: Cardiology

## 2023-09-11 VITALS — BP 110/78 | HR 70 | Ht 76.0 in | Wt 212.6 lb

## 2023-09-11 DIAGNOSIS — I130 Hypertensive heart and chronic kidney disease with heart failure and stage 1 through stage 4 chronic kidney disease, or unspecified chronic kidney disease: Secondary | ICD-10-CM | POA: Diagnosis not present

## 2023-09-11 DIAGNOSIS — N1831 Chronic kidney disease, stage 3a: Secondary | ICD-10-CM

## 2023-09-11 DIAGNOSIS — I5020 Unspecified systolic (congestive) heart failure: Secondary | ICD-10-CM

## 2023-09-11 DIAGNOSIS — E785 Hyperlipidemia, unspecified: Secondary | ICD-10-CM | POA: Insufficient documentation

## 2023-09-11 DIAGNOSIS — Z87891 Personal history of nicotine dependence: Secondary | ICD-10-CM | POA: Insufficient documentation

## 2023-09-11 DIAGNOSIS — Z79899 Other long term (current) drug therapy: Secondary | ICD-10-CM | POA: Insufficient documentation

## 2023-09-11 DIAGNOSIS — I2510 Atherosclerotic heart disease of native coronary artery without angina pectoris: Secondary | ICD-10-CM | POA: Diagnosis not present

## 2023-09-11 DIAGNOSIS — Z95810 Presence of automatic (implantable) cardiac defibrillator: Secondary | ICD-10-CM | POA: Diagnosis not present

## 2023-09-11 DIAGNOSIS — I5022 Chronic systolic (congestive) heart failure: Secondary | ICD-10-CM | POA: Insufficient documentation

## 2023-09-11 DIAGNOSIS — I428 Other cardiomyopathies: Secondary | ICD-10-CM

## 2023-09-11 DIAGNOSIS — I480 Paroxysmal atrial fibrillation: Secondary | ICD-10-CM | POA: Diagnosis not present

## 2023-09-11 DIAGNOSIS — I493 Ventricular premature depolarization: Secondary | ICD-10-CM | POA: Insufficient documentation

## 2023-09-11 DIAGNOSIS — Z7901 Long term (current) use of anticoagulants: Secondary | ICD-10-CM | POA: Insufficient documentation

## 2023-09-11 DIAGNOSIS — J439 Emphysema, unspecified: Secondary | ICD-10-CM | POA: Diagnosis not present

## 2023-09-11 LAB — BASIC METABOLIC PANEL WITH GFR
Anion gap: 7 (ref 5–15)
BUN: 13 mg/dL (ref 8–23)
CO2: 26 mmol/L (ref 22–32)
Calcium: 9.6 mg/dL (ref 8.9–10.3)
Chloride: 107 mmol/L (ref 98–111)
Creatinine, Ser: 1.83 mg/dL — ABNORMAL HIGH (ref 0.61–1.24)
GFR, Estimated: 39 mL/min — ABNORMAL LOW (ref >60)
Glucose, Bld: 115 mg/dL — ABNORMAL HIGH (ref 70–99)
Potassium: 4.3 mmol/L (ref 3.5–5.1)
Sodium: 140 mmol/L (ref 135–145)

## 2023-09-11 LAB — CBC
HCT: 40.4 % (ref 39.0–52.0)
Hemoglobin: 11.8 g/dL — ABNORMAL LOW (ref 13.0–17.0)
MCH: 22.6 pg — ABNORMAL LOW (ref 26.0–34.0)
MCHC: 29.2 g/dL — ABNORMAL LOW (ref 30.0–36.0)
MCV: 77.5 fL — ABNORMAL LOW (ref 80.0–100.0)
Platelets: 219 K/uL (ref 150–400)
RBC: 5.21 MIL/uL (ref 4.22–5.81)
RDW: 19.5 % — ABNORMAL HIGH (ref 11.5–15.5)
WBC: 7.2 K/uL (ref 4.0–10.5)
nRBC: 0 % (ref 0.0–0.2)

## 2023-09-11 LAB — DIGOXIN LEVEL: Digoxin Level: 0.9 ng/mL (ref 0.8–2.0)

## 2023-09-11 MED ORDER — AMIODARONE HCL 200 MG PO TABS: 200.0000 mg | ORAL_TABLET | Freq: Two times a day (BID) | ORAL | 3 refills | Status: DC

## 2023-09-11 NOTE — Patient Instructions (Signed)
 Medication Changes:  START AMIODARONE  200MG  TWICE DAILY   Lab Work:  Labs done today, your results will be available in MyChart, we will contact you for abnormal readings.  Follow-Up in: AS SCHEDULED WITH DR. CHERRIE NEXT MONTH   At the [facility], you and your health needs are our priority. We have a designated team specialized in the treatment of Heart Failure. This Care Team includes your primary Heart Failure Specialized Cardiologist (physician), Advanced Practice Providers (APPs- Physician Assistants and Nurse Practitioners), and Pharmacist who all work together to provide you with the care you need, when you need it.   You may see any of the following providers on your designated Care Team at your next follow up:  Dr. Toribio CHERRIE Dr. Ezra Shuck Dr. Ria Commander Dr. Odis Brownie Greig Mosses, NP Caffie Shed, [place] [facility] [place], [place] Beckey Coe, NP [place] Lee, NP Tinnie Redman, PharmD   Please be sure to bring in all your medications bottles to every appointment.   Need to Contact Us :  If you have any questions or concerns before your next appointment please send us  a message through [place] or call our office at [phone].    TO LEAVE A MESSAGE FOR THE NURSE SELECT OPTION 2, PLEASE LEAVE A MESSAGE INCLUDING: YOUR NAME DATE OF BIRTH CALL BACK NUMBER REASON FOR CALL**this is important as we prioritize the call backs  YOU WILL RECEIVE A CALL BACK THE SAME DAY AS LONG AS YOU CALL BEFORE 4:00 PM

## 2023-09-11 NOTE — Progress Notes (Signed)
 ReDS Vest / Clip - 09/11/23 1439       ReDS Vest / Clip   Station Marker C    Ruler Value 30    ReDS Value Range Low volume    ReDS Actual Value 33

## 2023-09-11 NOTE — Progress Notes (Signed)
 ADVANCED HF CLINIC NOTE   Primary Care: Nana Lauraine BRAVO, NP HF Cardiologist: Dr. Cherrie  Reason for Visit: f/u for chronic systolic heart failure   HPI: Austin [name] is a 68 y.o. old with a history of chronic HFrEF, HTN, HLD, nonobstructive CAD 2012, COPD, CKD Stage III, PAF, and single chamber Biotronik .    EF initially down 2012 to 20%. EF improved in 2013 to 50-55%.    Cath (2015): LCX 20% OMI, otherwise ok.  Echo (2022): EF 25-30% RV normal.  Echo (03/2022): EF 15-20% RV normal.    Admitted to [facility] Jan, Feb, March, April, and May of 2024 with A/C HFrEF. Noted to be in SR in March 2024.  Admitted 6/24 with a/c HF and AF. Echo showed EF < 20% RV normal. R/LHC showed minimal CAD, severe NICM EF 10%, mildly elevated filling pressures and severely reduced CI 1.9. AF was rate controlled, with frequent PVCs. Chemically converted to NSR on amio. Plan to repeat echo in 4-6 weeks after GDMT and PVC suppression, with eye toward LVAD if EF not improved.   Admitted in 11/24 with cardiogenic shock. Echo 11/24 EF < 20%. Treated with milrinone .  EF Device interrogation showed that he developed AV block in 10/24 and RV pacing went up > 70%. The long AV delay causing frequent paced beats and dropped beats as well. Underwent upgrade to CRT device with RA lead as well    Echo 3/25 EF 20-25% RV ok.  In rate controlled AF 3/25, underwent DCCV to NSR 05/19/23.   Seen in HF clinic 05/19. Entresto  stopped d/t hypotension/fall at home and losartan  started at 12.5 mg daily.    He was admitted with shortness of breath with respiratory distress, abdominal pain and volume overload on 07/11/23. Initially required BiPAP. Treated empirically for PNA. He was diuresed with IV lasix . He had CTA which showed markedly enlarged prostate. He required foley placement. Suspect volume overload was driven by urinary retention. He was started on flomax .   Seen in ED again 06/08 with abdominal pain and dyspnea.  Presentation appeared consistent with CHF. Given 60 mg lasix  IV and prescribed 20 mg Torsemide  daily (had not been taking).  Last [facility] visit, his EKG showed wide QRS 184 ms, NSR. Given EF remained severely reduced despite BiV pacing, he was sent back to EP to see if device could be further optimized. At EP visit, 7/14, he was noted to be in afib. Plan was to restart amiodarone  and to send back to our clinic, but pt currently is not taking this.   He returns today for routine f/u for systolic heart failure. Says he has been feeing better in recent wks. Getting around better. 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 He reports NYHA Class II symptoms. No LEE, orthopnea or PND. ReDs 33%, normal.   EKG today V-paced, underlying rhythm afib. 70 bpm. QRS 166 ms. As noted above he is not currently taking amiodarone . He reports full compliance w/ Eliquis .   BP well controlled 110/78.    Cardiac Studies - Echo 3/25 EF 20-25% RV ok   - TEE (7/24): LVEF < 20%, global HK, RV moderate HK, mild to moderate MR with posterior leaflet retracted, moderate TR, no LAA thrombus - R/LHC (6/24): minimal CAD, severe NICM EF 10% RA 9, PA 34/19 ( 27), PCWP 20 (v waves to 30), CO/CI (thermo) 4.3/1.9, PVR 1.6 - Echo (6/24): EF < 20%, echodensity on device in RV,  RV moderately down, mild MR, ascending aorta 40 mm - Echo (2/24): EF 15-20% RV normal.  - Echo (2022): EF 25-30% RV normal.  - Cath (2015): LCX 20% OMI, otherwise ok.  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    Single Chamber Biotronik   Myocardial infarction [facility]) 2012   12/31/2013   Nonischemic cardiomyopathy (HCC) 11/15/2010   cath 12/10/10 minimal nonobstructive coronary artery disease   Olecranon bursitis    Squamous cell carcinoma of arm, 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     apixaban  (ELIQUIS ) 5 MG TABS tablet Take 1 tablet (5 mg total) by mouth 2 (two) times daily. Start taking on 01/17/23 pm     digoxin  (LANOXIN ) 0.125 MG tablet Take 1 tablet (0.125 mg total) by mouth daily. 90 tablet 3   empagliflozin  (JARDIANCE ) 25 MG TABS tablet Take by mouth daily.     fenofibrate  (TRICOR ) 48 MG tablet Take 2 tablets by mouth Daily     ferrous sulfate  325 (65 FE) MG tablet Take 1 tablet (325 mg total) by mouth every Monday, Wednesday, and Friday. In the morning 30 tablet 11   losartan  (COZAAR ) 25 MG tablet Take 1 tablet (25 mg total) by mouth daily. 90 tablet 3   melatonin 3 MG TABS tablet Take 6 mg by mouth at bedtime as needed (sleep).     mexiletine (MEXITIL ) 200 MG capsule Take 1 capsule (200 mg total) by mouth every 12 (twelve) hours. 60 capsule 3   omeprazole  (PRILOSEC) 20 MG capsule Take 1 capsule (20 mg total) by mouth daily. 90 capsule 3   polyethylene glycol (MIRALAX  / GLYCOLAX ) 17 g packet Take 17 g by mouth daily as needed (constipation.).     potassium chloride  SA (KLOR-CON  M) 20 MEQ tablet Take 2 tablets (40 mEq total) by mouth daily. 180 tablet 3   spironolactone  (ALDACTONE ) 25 MG tablet Take 1 tablet (25 mg total) by mouth daily. 30 tablet 0   tamsulosin  (FLOMAX ) 0.4 MG CAPS capsule Take 1 capsule (0.4 mg total) by mouth daily  after supper. 30 capsule 0   torsemide  (DEMADEX ) 20 MG tablet Take 1 tablet (20 mg total) by mouth daily. 90 tablet 3   No current facility-administered medications for this encounter.   Allergies  Allergen Reactions   Lisinopril  Cough   Social History   Socioeconomic History   Marital status: Single    Spouse name: Not on file   Number of children: Not on file   Years of education: Not on file   Highest education level: Not on file  Occupational History   Not on file  Tobacco Use   Smoking status: Former  Current packs/day: 0.00    Average packs/day: 2.0 packs/day for 14.0 years (28.0 ttl pk-yrs)    Types: Cigarettes    Start date: 02/15/1979    Quit date: 02/14/1993    Years since quitting: 30.5   Smokeless tobacco: Not on file   Tobacco comments:    Former smoker 03/20/23  Substance and Sexual Activity   Alcohol use: Yes    Alcohol/week: 3.0 standard drinks of alcohol    Types: 1 Cans of beer, 2 Shots of liquor per week    Comment: once a month a beer or couple shots of bourbon 03/20/23   Drug use: Not Currently    Types: Marijuana    Comment: occ smokes marjuana 03/20/23   Sexual activity: Not on file  Other Topics Concern   Not on file  Social History Narrative   He is single and lives in [place].  He drives a refrigerator truck.   Social Drivers of Health   Financial Resource Strain: Patient Declined (03/02/2023)   Received from [facility]   Overall Financial Resource Strain (CARDIA)    Difficulty of Paying Living Expenses: Patient declined  Food Insecurity: No Food Insecurity (07/12/2023)   Hunger Vital Sign    Worried About Running Out of Food in the Last Year: Never true    Ran Out of Food in the Last Year: Never true  Transportation Needs: No Transportation Needs (07/12/2023)   PRAPARE - Administrator, Civil Service (Medical): No    Lack of Transportation (Non-Medical): No  Physical Activity: Sufficiently Active (11/01/2022)    Received from [facility]   Exercise Vital Sign    On average, how many days per week do you engage in moderate to strenuous exercise (like a brisk walk)?: 3 days    On average, how many minutes do you engage in exercise at this level?: 150+ min  Stress: No Stress Concern Present (11/01/2022)   Received from [facility] - Occupational Stress Questionnaire    Feeling of Stress : Not at all  Social Connections: Socially Isolated (07/12/2023)   Social Connection and Isolation Panel    Frequency of Communication with Friends and Family: More than three times a week    Frequency of Social Gatherings with Friends and Family: Twice a week    Attends Religious Services: Never    Database administrator or Organizations: No    Attends Banker Meetings: Never    Marital Status: Never married  Intimate Partner Violence: Not At Risk (07/12/2023)   Humiliation, Afraid, Rape, and Kick questionnaire    Fear of Current or Ex-Partner: No    Emotionally Abused: No    Physically Abused: No    Sexually Abused: No   Family History  Problem Relation Age of Onset   Breast cancer Mother    Cancer Father    Heart attack Brother    Heart attack Brother 42   BP 110/78   Pulse 70   Ht 6' 4 (1.93 m)   Wt 96.4 kg (212 lb 9.6 oz)   SpO2 98%   BMI 25.88 kg/m   Wt Readings from Last 3 Encounters:  09/11/23 96.4 kg (212 lb 9.6 oz)  08/28/23 96 kg (211 lb 11.2 oz)  07/25/23 98 kg (216 lb)   PHYSICAL EXAM: ReDS 33%, normal  General:  well appearing. No distress Neck: No JVD  Cor: RRR  Lungs: CTAB  Abdomen: soft, nontender,  non distended   ECG (personally reviewed): V paced 70 bpm, underlying rhythm afib   79 bpm, QRS 184 ms  ASSESSMENT & PLAN: 1. Chronic Systolic Heart Failure - NICM  - Initially diagnosed with HFrEF in 2012. EF previously improved to 50-55% on medications.  - Echo (6/24): EF now < 20%. He does have single chamber  Biotronik.  - R/LHC (7/24) w/ minimal CAD, mildly elevated filling pressures and severely reduced CO/CI by thermo 4.3/1.9.  - Echo 11/24 EF < 20% -> shock - ICD interrogated 11/24 ->he developed AV block in 10/24 and RV pacing went up > 70% reculting in SR with long AV delay causing frequent paced beats and dropped beats as well.  - CRT upgrade 11/24 with a placement of an atrial lead  - Echo 05/08/23 LV markedly dilated EF 20-25% RV ok  - Echo 05/25: EF 20%, RV okay - Stable NYHA II. Euvolemic on exam and by ReDs, 33%  - Continue Jardiance  10 mg daily  - Continue Torsemide  20 mg daily + KCl 20 mEq daily. Check BMP/BNP today  - Continue Losartan  25 mg daily  - Continue digoxin  0.125 mg daily. Check dig level  - Continue spiro 25 mg daily - Off beta blocker with history of low-output - Worry that he may eventually need advanced therapies, most likely LVAD. Doing ok currently   2. Paroxysmal A fib  - s/p DCCV to NSR 4/25 - Recurrent AF ~ 6 hrs on 05/25. Recurrent Afib detected in EP clinic 7/21. Persistent on EKG today, rate controlled - start amiodarone  200 mg bid - continue Eliquis  5 mg bid - keep f/u in 3 wks. If still in Afib,will set up for outpatient DCCV     3. CKD Stage IIIa - Baseline SCr 1.4-1.7 - Continue Jardiance  10 mg daily  - check BMP today    4. ?RV Lead vegetation - possible that this is due to the curl-i-cue that the lead forms in the right heart, as noted on CXR.  - No vegetation on TEE 7/24   5. PVCs - High PVC burden. ~ 50% during prior admission.  - Now better suppressed - Continue mexilitene 200 bid.  - Follows with EP   Keep f/u visit in 3 wks.   Austin Bazzi, PA-C  2:22 PM

## 2023-09-20 ENCOUNTER — Ambulatory Visit: Payer: Self-pay | Admitting: Cardiology

## 2023-09-25 ENCOUNTER — Telehealth (HOSPITAL_COMMUNITY): Payer: Self-pay | Admitting: Internal Medicine

## 2023-09-25 NOTE — Telephone Encounter (Signed)
 Called to confirm/remind patient of their appointment at the [facility] on 09/25/23.   Appointment:   [] Confirmed  [x] Left mess   [] No answer/No voice mail  [] VM Full/unable to leave message  [] Phone not in service  Patient reminded to bring all medications and/or complete list.  Confirmed patient has transportation. Gave directions, instructed to utilize valet parking.

## 2023-09-26 ENCOUNTER — Other Ambulatory Visit (HOSPITAL_COMMUNITY): Payer: Self-pay | Admitting: *Deleted

## 2023-09-26 ENCOUNTER — Ambulatory Visit (HOSPITAL_COMMUNITY)
Admission: RE | Admit: 2023-09-26 | Discharge: 2023-09-26 | Disposition: A | Discharge status: Home / Self Care | Source: Ambulatory Visit | Attending: Internal Medicine | Admitting: Internal Medicine

## 2023-09-26 VITALS — BP 102/58 | HR 70 | Wt 212.0 lb

## 2023-09-26 DIAGNOSIS — I5020 Unspecified systolic (congestive) heart failure: Secondary | ICD-10-CM | POA: Insufficient documentation

## 2023-09-26 DIAGNOSIS — I4819 Other persistent atrial fibrillation: Secondary | ICD-10-CM

## 2023-09-26 DIAGNOSIS — I442 Atrioventricular block, complete: Secondary | ICD-10-CM | POA: Insufficient documentation

## 2023-09-26 DIAGNOSIS — Z950 Presence of cardiac pacemaker: Secondary | ICD-10-CM | POA: Diagnosis not present

## 2023-09-26 DIAGNOSIS — I480 Paroxysmal atrial fibrillation: Secondary | ICD-10-CM | POA: Diagnosis not present

## 2023-09-26 LAB — BASIC METABOLIC PANEL WITH GFR
Anion gap: 10 (ref 5–15)
BUN: 18 mg/dL (ref 8–23)
CO2: 24 mmol/L (ref 22–32)
Calcium: 9.4 mg/dL (ref 8.9–10.3)
Chloride: 106 mmol/L (ref 98–111)
Creatinine, Ser: 2.08 mg/dL — ABNORMAL HIGH (ref 0.61–1.24)
GFR, Estimated: 34 mL/min — ABNORMAL LOW (ref >60)
Glucose, Bld: 96 mg/dL (ref 70–99)
Potassium: 3.9 mmol/L (ref 3.5–5.1)
Sodium: 140 mmol/L (ref 135–145)

## 2023-09-26 LAB — CBC
HCT: 40.9 % (ref 39.0–52.0)
Hemoglobin: 12 g/dL — ABNORMAL LOW (ref 13.0–17.0)
MCH: 22.7 pg — ABNORMAL LOW (ref 26.0–34.0)
MCHC: 29.3 g/dL — ABNORMAL LOW (ref 30.0–36.0)
MCV: 77.3 fL — ABNORMAL LOW (ref 80.0–100.0)
Platelets: 238 K/uL (ref 150–400)
RBC: 5.29 MIL/uL (ref 4.22–5.81)
RDW: 19.4 % — ABNORMAL HIGH (ref 11.5–15.5)
WBC: 7.4 K/uL (ref 4.0–10.5)
nRBC: 0 % (ref 0.0–0.2)

## 2023-09-26 LAB — BRAIN NATRIURETIC PEPTIDE: B Natriuretic Peptide: 306.9 pg/mL — ABNORMAL HIGH (ref 0.0–100.0)

## 2023-09-26 MED ORDER — AMIODARONE HCL 200 MG PO TABS: 200.0000 mg | ORAL_TABLET | Freq: Every day | ORAL | 3 refills | Status: DC

## 2023-09-26 NOTE — Patient Instructions (Signed)
 Medication Changes:  DECREASE Amiodarone  to 200 mg DAILY  Lab Work:  Labs done today, we will call you for abnormal results  Testing/Procedures:  Your physician has recommended that you have a Cardioversion (DCCV). Electrical Cardioversion uses a jolt of electricity to your heart either through paddles or wired patches attached to your chest. This is a controlled, usually prescheduled, procedure. Defibrillation is done under light anesthesia in the hospital, and you usually go home the day of the procedure. This is done to get your heart back into a normal rhythm. You are not awake for the procedure. Please see instructions below.  Your physician has requested that you have an echocardiogram. Echocardiography is a painless test that uses sound waves to create images of your heart. It provides your doctor with information about the size and shape of your heart and how well your heart's chambers and valves are working. This procedure takes approximately one hour. There are no restrictions for this procedure. Please do NOT wear cologne, perfume, aftershave, or lotions (deodorant is allowed). Please arrive 15 minutes prior to your appointment time.  Please note: We ask at that you not bring children with you during ultrasound (echo/ vascular) testing. Due to room size and safety concerns, children are not allowed in the ultrasound rooms during exams. Our front office staff cannot provide observation of children in our lobby area while testing is being conducted. An adult accompanying a patient to their appointment will only be allowed in the ultrasound room at the discretion of the ultrasound technician under special circumstances. We apologize for any inconvenience.  Special Instructions // Education:  Do the following things EVERYDAY: Weigh yourself in the morning before breakfast. Write it down and keep it in a log. Take your medicines as prescribed Eat low salt foods--Limit salt (sodium) to  2000 mg per day.  Stay as active as you can everyday Limit all fluids for the day to less than 2 liters      CARDIOVERSION INSTRUCTIONS:  You are scheduled for a Cardioversion on Monday, August 18 with Dr. CHERRIE.    Please arrive at the [facility] (Main Entrance A) at [facility]: [address] [place], [place] [address] at 7:30 AM (This time is 1 hour(s) before your procedure to ensure your preparation).   Free valet parking service is available. You will check in at ADMITTING.   *Please Note: You will receive a call the day before your procedure to confirm the appointment time. That time may have changed from the original time based on the schedule for that day.*    DIET:  Nothing to eat or drink after midnight except a sip of water with medications (see medication instructions below)  MEDICATION INSTRUCTIONS:   HOLD: Empagliflozin  (Jardiance ) for 3 days prior to the procedure. Last dose on Thursday, August 14.   Continue taking your anticoagulant (blood thinner): Apixaban  (Eliquis ).   MONDAY 8/18 AM DO NOT TAKE: Jardiance , Torsemide , or Spironolactone    LABS:  done today  FYI:  For your safety, and to allow us  to monitor your vital signs accurately during the surgery/procedure we request: If you have artificial nails, gel coating, SNS etc, please have those removed prior to your surgery/procedure. Not having the nail coverings /polish removed may result in cancellation or delay of your surgery/procedure.  Your support person will be asked to wait in the waiting room during your procedure.  It is OK to have someone drop you off and come back when you  are ready to be discharged.  You cannot drive after the procedure and will need someone to drive you home.  Bring your insurance cards.  *Special Note: Every effort is made to have your procedure done on time. Occasionally there are emergencies that occur at the hospital that may cause delays. Please be patient if  a delay does occur.     Follow-Up in: 3 months with an echocardiogram   At the [facility], you and your health needs are our priority. We have a designated team specialized in the treatment of Heart Failure. This Care Team includes your primary Heart Failure Specialized Cardiologist (physician), Advanced Practice Providers (APPs- Physician Assistants and Nurse Practitioners), and Pharmacist who all work together to provide you with the care you need, when you need it.   You may see any of the following providers on your designated Care Team at your next follow up:  Dr. Toribio Fuel Dr. Ezra Shuck Dr. Ria Commander Dr. Odis Brownie Greig Mosses, NP Caffie Shed, [place] [facility] [place], [place] Beckey Coe, NP [place] Lee, NP Tinnie Redman, PharmD   Please be sure to bring in all your medications bottles to every appointment.   Need to Contact Us :  If you have any questions or concerns before your next appointment please send us  a message through [place] or call our office at [phone].    TO LEAVE A MESSAGE FOR THE NURSE SELECT OPTION 2, PLEASE LEAVE A MESSAGE INCLUDING: YOUR NAME DATE OF BIRTH CALL BACK NUMBER REASON FOR CALL**this is important as we prioritize the call backs  YOU WILL RECEIVE A CALL BACK THE SAME DAY AS LONG AS YOU CALL BEFORE 4:00 PM

## 2023-09-26 NOTE — Progress Notes (Incomplete)
 ADVANCED HF CLINIC NOTE   Primary Care: Nana Lauraine BRAVO, NP HF Cardiologist: Dr. Cherrie  Reason for Visit: f/u for chronic systolic heart failure   HPI: Mr. Favia is a 68 y.o. old with a history of chronic HFrEF, HTN, HLD, nonobstructive CAD 2012, COPD, CKD Stage III, PAF, and single chamber Biotronik .    EF initially down 2012 to 20%. EF improved in 2013 to 50-55%.    Cath (2015): LCX 20% OMI, otherwise ok.  Echo (2022): EF 25-30% RV normal.  Echo (03/2022): EF 15-20% RV normal.    Admitted to [facility] Jan, Feb, March, April, and May of 2024 with A/C HFrEF. Noted to be in SR in March 2024.  Admitted 6/24 with a/c HF and AF. Echo showed EF < 20% RV normal. R/LHC showed minimal CAD, severe NICM EF 10%, mildly elevated filling pressures and severely reduced CI 1.9. AF was rate controlled, with frequent PVCs. Chemically converted to NSR on amio. Plan to repeat echo in 4-6 weeks after GDMT and PVC suppression, with eye toward LVAD if EF not improved.   Admitted in 11/24 with cardiogenic shock. Echo 11/24 EF < 20%. Treated with milrinone .  EF Device interrogation showed that he developed AV block in 10/24 and RV pacing went up > 70%. The long AV delay causing frequent paced beats and dropped beats as well. Underwent upgrade to CRT device with RA lead as well    Echo 3/25 EF 20-25% RV ok.  In rate controlled AF 3/25, underwent DCCV to NSR 05/19/23.   Seen in HF clinic 05/19. Entresto  stopped d/t hypotension/fall at home and losartan  started at 12.5 mg daily.    He was admitted with shortness of breath with respiratory distress, abdominal pain and volume overload on 07/11/23. Initially required BiPAP. Treated empirically for PNA. He was diuresed with IV lasix . He had CTA which showed markedly enlarged prostate. He required foley placement. Suspect volume overload was driven by urinary retention. He was started on flomax .   Seen in ED again 06/08 with abdominal pain and dyspnea.  Presentation appeared consistent with CHF. Given 60 mg lasix  IV and prescribed 20 mg Torsemide  daily (had not been taking).  Last [facility] visit, his EKG showed wide QRS 184 ms, NSR. Given EF remained severely reduced despite BiV pacing, he was sent back to EP to see if device could be further optimized. At EP visit, 7/14, he was noted to be in afib. Plan was to restart amiodarone  and to send back to our clinic, but pt currently is not taking this.   He returns today for routine f/u for systolic heart failure. Says he feels great after starting amiodarone . Best he has felt in a long time. Able to do all activities without problem. No CP, SOB or edema. Compliant with meds.   EKG today V-paced, underlying rhythm afib. 70 bpm Personally reviewed  Cardiac Studies - Echo 3/25 EF 20-25% RV ok   - TEE (7/24): LVEF < 20%, global HK, RV moderate HK, mild to moderate MR with posterior leaflet retracted, moderate TR, no LAA thrombus - R/LHC (6/24): minimal CAD, severe NICM EF 10% RA 9, PA 34/19 ( 27), PCWP 20 (v waves to 30), CO/CI (thermo) 4.3/1.9, PVR 1.6 - Echo (6/24): EF < 20%, echodensity on device in RV, RV moderately down, mild MR, ascending aorta 40 mm - Echo (2/24): EF 15-20% RV normal.  - Echo (2022): EF 25-30% RV normal.  - Cath (2015): LCX 20% OMI, otherwise ok.  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   Single Chamber Biotronik   Myocardial infarction [facility]) 2012   12/31/2013   Nonischemic cardiomyopathy (HCC) 11/15/2010   cath 12/10/10 minimal nonobstructive  coronary artery disease   Olecranon bursitis    Squamous cell carcinoma of arm, 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     amiodarone  (PACERONE ) 200 MG tablet Take 1 tablet (200 mg total) by mouth 2 (two) times daily. 60 tablet 3   apixaban  (ELIQUIS ) 5 MG TABS tablet Take 1 tablet (5 mg total) by mouth 2 (two) times daily. Start taking on 01/17/23 pm     digoxin  (LANOXIN ) 0.125 MG tablet Take 1 tablet (0.125 mg total) by mouth daily. 90 tablet 3   empagliflozin  (JARDIANCE ) 25 MG TABS tablet Take by mouth daily.     fenofibrate  (TRICOR ) 48 MG tablet Take 2 tablets by mouth Daily     losartan  (COZAAR ) 25 MG tablet Take 1 tablet (25 mg total) by mouth daily. 90 tablet 3   melatonin 3 MG TABS tablet Take 6 mg by mouth at bedtime as needed (sleep).     mexiletine (MEXITIL ) 200 MG capsule Take 1 capsule (200 mg total) by mouth every 12 (twelve) hours. 60 capsule 3   omeprazole  (PRILOSEC) 20 MG capsule Take 1 capsule (20 mg total) by mouth daily. 90 capsule 3   polyethylene glycol (MIRALAX  / GLYCOLAX ) 17 g packet Take 17 g by mouth daily as needed (constipation.).     potassium chloride  SA (KLOR-CON  M) 20 MEQ tablet Take 2 tablets (40 mEq total) by mouth daily. 180 tablet 3   spironolactone  (ALDACTONE ) 25 MG tablet Take 1 tablet (25 mg total) by mouth daily. 30 tablet 0   tamsulosin  (FLOMAX ) 0.4 MG CAPS capsule Take 1 capsule (0.4 mg total) by mouth daily after supper. 30 capsule 0   torsemide  (DEMADEX ) 20 MG tablet Take 1 tablet (20 mg total) by mouth daily. 90 tablet 3   ferrous sulfate  325 (65 FE) MG tablet Take 1 tablet (325 mg total) by mouth  every Monday, Wednesday, and Friday. In the morning (Patient not taking: Reported on 09/26/2023) 30 tablet 11   No current facility-administered medications for this encounter.   Allergies  Allergen Reactions   Lisinopril  Cough   Social History   Socioeconomic History   Marital status: Single    Spouse name: Not on file   Number of children: Not on file   Years of education: Not on file   Highest education level: Not on file  Occupational History   Not on file  Tobacco Use   Smoking status: Former    Current packs/day: 0.00  Average packs/day: 2.0 packs/day for 14.0 years (28.0 ttl pk-yrs)    Types: Cigarettes    Start date: 02/15/1979    Quit date: 02/14/1993    Years since quitting: 30.6   Smokeless tobacco: Not on file   Tobacco comments:    Former smoker 03/20/23  Substance and Sexual Activity   Alcohol use: Yes    Alcohol/week: 3.0 standard drinks of alcohol    Types: 1 Cans of beer, 2 Shots of liquor per week    Comment: once a month a beer or couple shots of bourbon 03/20/23   Drug use: Not Currently    Types: Marijuana    Comment: occ smokes marjuana 03/20/23   Sexual activity: Not on file  Other Topics Concern   Not on file  Social History Narrative   He is single and lives in [place].  He drives a refrigerator truck.   Social Drivers of Health   Financial Resource Strain: Patient Declined (03/02/2023)   Received from [facility]   Overall Financial Resource Strain (CARDIA)    Difficulty of Paying Living Expenses: Patient declined  Food Insecurity: No Food Insecurity (07/12/2023)   Hunger Vital Sign    Worried About Running Out of Food in the Last Year: Never true    Ran Out of Food in the Last Year: Never true  Transportation Needs: No Transportation Needs (07/12/2023)   PRAPARE - Administrator, Civil Service (Medical): No    Lack of Transportation (Non-Medical): No  Physical Activity: Sufficiently Active (11/01/2022)   Received from  [facility]   Exercise Vital Sign    On average, how many days per week do you engage in moderate to strenuous exercise (like a brisk walk)?: 3 days    On average, how many minutes do you engage in exercise at this level?: 150+ min  Stress: No Stress Concern Present (11/01/2022)   Received from [facility] - Occupational Stress Questionnaire    Feeling of Stress : Not at all  Social Connections: Socially Isolated (07/12/2023)   Social Connection and Isolation Panel    Frequency of Communication with Friends and Family: More than three times a week    Frequency of Social Gatherings with Friends and Family: Twice a week    Attends Religious Services: Never    Database administrator or Organizations: No    Attends Banker Meetings: Never    Marital Status: Never married  Intimate Partner Violence: Not At Risk (07/12/2023)   Humiliation, Afraid, Rape, and Kick questionnaire    Fear of Current or Ex-Partner: No    Emotionally Abused: No    Physically Abused: No    Sexually Abused: No   Family History  Problem Relation Age of Onset   Breast cancer Mother    Cancer Father    Heart attack Brother    Heart attack Brother 42   BP (!) 102/58   Pulse 70   Wt 96.2 kg (212 lb)   SpO2 98%   BMI 25.81 kg/m   Wt Readings from Last 3 Encounters:  09/26/23 96.2 kg (212 lb)  09/11/23 96.4 kg (212 lb 9.6 oz)  08/28/23 96 kg (211 lb 11.2 oz)   PHYSICAL EXAM: General:  Well appearing. No resp difficulty HEENT: normal Neck: supple. no JVD. Carotids 2+ bilat; no bruits. No lymphadenopathy or thryomegaly appreciated. Cor: PMI nondisplaced. Regular rate & rhythm. No rubs, gallops or murmurs. Lungs: clear  Abdomen: soft, nontender, nondistended. No hepatosplenomegaly. No bruits or masses. Good bowel sounds. Extremities: no cyanosis, clubbing, rash, edema Neuro: alert & orientedx3, cranial nerves grossly intact. moves all 4 extremities w/o  difficulty. Affect pleasant  ASSESSMENT & PLAN: 1. Chronic Systolic Heart Failure - NICM  - Initially diagnosed with HFrEF in 2012. EF previously improved to 50-55% on medications.  - Echo (6/24): EF now < 20%. He does have single chamber Biotronik.  - R/LHC (7/24) w/ minimal CAD, mildly elevated filling pressures and severely reduced CO/CI by thermo 4.3/1.9.  - Echo 11/24 EF < 20% -> shock - ICD interrogated 11/24 ->he developed AV block in 10/24 and RV pacing went up > 70% reculting in SR with long AV delay causing frequent paced beats and dropped beats as well.  - CRT upgrade 11/24 with a placement of an atrial lead  - Echo 05/08/23 LV markedly dilated EF 20-25% RV ok  - Echo 05/25: EF 20%, RV okay - Much improved with CRT and suppression of PVCS - NYHA II. Volume status ok - Continue Jardiance  10 mg daily  - Continue Torsemide  20 mg daily + KCl 20 mEq daily. Check BMP/BNP today  - Continue Losartan  25 mg daily  - Continue digoxin  0.125 mg daily. Check dig level  - Continue spiro 25 mg daily - Off beta blocker with history of low-output - Will get repeat echo   2. Paroxysmal A fib  - s/p DCCV to NSR 4/25 - Recurrent AF ~ 6 hrs on 05/25. Recurrent Afib detected in EP clinic 7/21. - On amio 200 bid. Decrease amio to 200 daily - Remains in AF - Will arrange for DC-CV next week. Has not mised Eliquis  - continue Eliquis  5 mg bid    3. CKD Stage IIIa - Baseline SCr 1.4-1.7 - Continue Jardiance  10 mg daily  - labs today   4. ?RV Lead vegetation - possible that this is due to the curl-i-cue that the lead forms in the right heart, as noted on CXR.  - No vegetation on TEE 7/24   5. PVCs - High PVC burden. ~ 50% during prior admission.  - Now better suppressed - Continue mexilitene 200 bid.  - Follows with EP   Toribio Fuel, MD  4:06 PM

## 2023-09-26 NOTE — H&P (View-Only) (Signed)
 ADVANCED HF CLINIC NOTE   Primary Care: Nana Lauraine BRAVO, NP HF Cardiologist: Dr. Cherrie  Reason for Visit: f/u for chronic systolic heart failure   HPI: Mr. Favia is a 68 y.o. old with a history of chronic HFrEF, HTN, HLD, nonobstructive CAD 2012, COPD, CKD Stage III, PAF, and single chamber Biotronik .    EF initially down 2012 to 20%. EF improved in 2013 to 50-55%.    Cath (2015): LCX 20% OMI, otherwise ok.  Echo (2022): EF 25-30% RV normal.  Echo (03/2022): EF 15-20% RV normal.    Admitted to [facility] Jan, Feb, March, April, and May of 2024 with A/C HFrEF. Noted to be in SR in March 2024.  Admitted 6/24 with a/c HF and AF. Echo showed EF < 20% RV normal. R/LHC showed minimal CAD, severe NICM EF 10%, mildly elevated filling pressures and severely reduced CI 1.9. AF was rate controlled, with frequent PVCs. Chemically converted to NSR on amio. Plan to repeat echo in 4-6 weeks after GDMT and PVC suppression, with eye toward LVAD if EF not improved.   Admitted in 11/24 with cardiogenic shock. Echo 11/24 EF < 20%. Treated with milrinone .  EF Device interrogation showed that he developed AV block in 10/24 and RV pacing went up > 70%. The long AV delay causing frequent paced beats and dropped beats as well. Underwent upgrade to CRT device with RA lead as well    Echo 3/25 EF 20-25% RV ok.  In rate controlled AF 3/25, underwent DCCV to NSR 05/19/23.   Seen in HF clinic 05/19. Entresto  stopped d/t hypotension/fall at home and losartan  started at 12.5 mg daily.    He was admitted with shortness of breath with respiratory distress, abdominal pain and volume overload on 07/11/23. Initially required BiPAP. Treated empirically for PNA. He was diuresed with IV lasix . He had CTA which showed markedly enlarged prostate. He required foley placement. Suspect volume overload was driven by urinary retention. He was started on flomax .   Seen in ED again 06/08 with abdominal pain and dyspnea.  Presentation appeared consistent with CHF. Given 60 mg lasix  IV and prescribed 20 mg Torsemide  daily (had not been taking).  Last [facility] visit, his EKG showed wide QRS 184 ms, NSR. Given EF remained severely reduced despite BiV pacing, he was sent back to EP to see if device could be further optimized. At EP visit, 7/14, he was noted to be in afib. Plan was to restart amiodarone  and to send back to our clinic, but pt currently is not taking this.   He returns today for routine f/u for systolic heart failure. Says he feels great after starting amiodarone . Best he has felt in a long time. Able to do all activities without problem. No CP, SOB or edema. Compliant with meds.   EKG today V-paced, underlying rhythm afib. 70 bpm Personally reviewed  Cardiac Studies - Echo 3/25 EF 20-25% RV ok   - TEE (7/24): LVEF < 20%, global HK, RV moderate HK, mild to moderate MR with posterior leaflet retracted, moderate TR, no LAA thrombus - R/LHC (6/24): minimal CAD, severe NICM EF 10% RA 9, PA 34/19 ( 27), PCWP 20 (v waves to 30), CO/CI (thermo) 4.3/1.9, PVR 1.6 - Echo (6/24): EF < 20%, echodensity on device in RV, RV moderately down, mild MR, ascending aorta 40 mm - Echo (2/24): EF 15-20% RV normal.  - Echo (2022): EF 25-30% RV normal.  - Cath (2015): LCX 20% OMI, otherwise ok.  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   Single Chamber Biotronik   Myocardial infarction [facility]) 2012   12/31/2013   Nonischemic cardiomyopathy (HCC) 11/15/2010   cath 12/10/10 minimal nonobstructive  coronary artery disease   Olecranon bursitis    Squamous cell carcinoma of arm, 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     amiodarone  (PACERONE ) 200 MG tablet Take 1 tablet (200 mg total) by mouth 2 (two) times daily. 60 tablet 3   apixaban  (ELIQUIS ) 5 MG TABS tablet Take 1 tablet (5 mg total) by mouth 2 (two) times daily. Start taking on 01/17/23 pm     digoxin  (LANOXIN ) 0.125 MG tablet Take 1 tablet (0.125 mg total) by mouth daily. 90 tablet 3   empagliflozin  (JARDIANCE ) 25 MG TABS tablet Take by mouth daily.     fenofibrate  (TRICOR ) 48 MG tablet Take 2 tablets by mouth Daily     losartan  (COZAAR ) 25 MG tablet Take 1 tablet (25 mg total) by mouth daily. 90 tablet 3   melatonin 3 MG TABS tablet Take 6 mg by mouth at bedtime as needed (sleep).     mexiletine (MEXITIL ) 200 MG capsule Take 1 capsule (200 mg total) by mouth every 12 (twelve) hours. 60 capsule 3   omeprazole  (PRILOSEC) 20 MG capsule Take 1 capsule (20 mg total) by mouth daily. 90 capsule 3   polyethylene glycol (MIRALAX  / GLYCOLAX ) 17 g packet Take 17 g by mouth daily as needed (constipation.).     potassium chloride  SA (KLOR-CON  M) 20 MEQ tablet Take 2 tablets (40 mEq total) by mouth daily. 180 tablet 3   spironolactone  (ALDACTONE ) 25 MG tablet Take 1 tablet (25 mg total) by mouth daily. 30 tablet 0   tamsulosin  (FLOMAX ) 0.4 MG CAPS capsule Take 1 capsule (0.4 mg total) by mouth daily after supper. 30 capsule 0   torsemide  (DEMADEX ) 20 MG tablet Take 1 tablet (20 mg total) by mouth daily. 90 tablet 3   ferrous sulfate  325 (65 FE) MG tablet Take 1 tablet (325 mg total) by mouth  every Monday, Wednesday, and Friday. In the morning (Patient not taking: Reported on 09/26/2023) 30 tablet 11   No current facility-administered medications for this encounter.   Allergies  Allergen Reactions   Lisinopril  Cough   Social History   Socioeconomic History   Marital status: Single    Spouse name: Not on file   Number of children: Not on file   Years of education: Not on file   Highest education level: Not on file  Occupational History   Not on file  Tobacco Use   Smoking status: Former    Current packs/day: 0.00  Average packs/day: 2.0 packs/day for 14.0 years (28.0 ttl pk-yrs)    Types: Cigarettes    Start date: 02/15/1979    Quit date: 02/14/1993    Years since quitting: 30.6   Smokeless tobacco: Not on file   Tobacco comments:    Former smoker 03/20/23  Substance and Sexual Activity   Alcohol use: Yes    Alcohol/week: 3.0 standard drinks of alcohol    Types: 1 Cans of beer, 2 Shots of liquor per week    Comment: once a month a beer or couple shots of bourbon 03/20/23   Drug use: Not Currently    Types: Marijuana    Comment: occ smokes marjuana 03/20/23   Sexual activity: Not on file  Other Topics Concern   Not on file  Social History Narrative   He is single and lives in [place].  He drives a refrigerator truck.   Social Drivers of Health   Financial Resource Strain: Patient Declined (03/02/2023)   Received from [facility]   Overall Financial Resource Strain (CARDIA)    Difficulty of Paying Living Expenses: Patient declined  Food Insecurity: No Food Insecurity (07/12/2023)   Hunger Vital Sign    Worried About Running Out of Food in the Last Year: Never true    Ran Out of Food in the Last Year: Never true  Transportation Needs: No Transportation Needs (07/12/2023)   PRAPARE - Administrator, Civil Service (Medical): No    Lack of Transportation (Non-Medical): No  Physical Activity: Sufficiently Active (11/01/2022)   Received from  [facility]   Exercise Vital Sign    On average, how many days per week do you engage in moderate to strenuous exercise (like a brisk walk)?: 3 days    On average, how many minutes do you engage in exercise at this level?: 150+ min  Stress: No Stress Concern Present (11/01/2022)   Received from [facility] - Occupational Stress Questionnaire    Feeling of Stress : Not at all  Social Connections: Socially Isolated (07/12/2023)   Social Connection and Isolation Panel    Frequency of Communication with Friends and Family: More than three times a week    Frequency of Social Gatherings with Friends and Family: Twice a week    Attends Religious Services: Never    Database administrator or Organizations: No    Attends Banker Meetings: Never    Marital Status: Never married  Intimate Partner Violence: Not At Risk (07/12/2023)   Humiliation, Afraid, Rape, and Kick questionnaire    Fear of Current or Ex-Partner: No    Emotionally Abused: No    Physically Abused: No    Sexually Abused: No   Family History  Problem Relation Age of Onset   Breast cancer Mother    Cancer Father    Heart attack Brother    Heart attack Brother 42   BP (!) 102/58   Pulse 70   Wt 96.2 kg (212 lb)   SpO2 98%   BMI 25.81 kg/m   Wt Readings from Last 3 Encounters:  09/26/23 96.2 kg (212 lb)  09/11/23 96.4 kg (212 lb 9.6 oz)  08/28/23 96 kg (211 lb 11.2 oz)   PHYSICAL EXAM: General:  Well appearing. No resp difficulty HEENT: normal Neck: supple. no JVD. Carotids 2+ bilat; no bruits. No lymphadenopathy or thryomegaly appreciated. Cor: PMI nondisplaced. Regular rate & rhythm. No rubs, gallops or murmurs. Lungs: clear  Abdomen: soft, nontender, nondistended. No hepatosplenomegaly. No bruits or masses. Good bowel sounds. Extremities: no cyanosis, clubbing, rash, edema Neuro: alert & orientedx3, cranial nerves grossly intact. moves all 4 extremities w/o  difficulty. Affect pleasant  ASSESSMENT & PLAN: 1. Chronic Systolic Heart Failure - NICM  - Initially diagnosed with HFrEF in 2012. EF previously improved to 50-55% on medications.  - Echo (6/24): EF now < 20%. He does have single chamber Biotronik.  - R/LHC (7/24) w/ minimal CAD, mildly elevated filling pressures and severely reduced CO/CI by thermo 4.3/1.9.  - Echo 11/24 EF < 20% -> shock - ICD interrogated 11/24 ->he developed AV block in 10/24 and RV pacing went up > 70% reculting in SR with long AV delay causing frequent paced beats and dropped beats as well.  - CRT upgrade 11/24 with a placement of an atrial lead  - Echo 05/08/23 LV markedly dilated EF 20-25% RV ok  - Echo 05/25: EF 20%, RV okay - Much improved with CRT and suppression of PVCS - NYHA II. Volume status ok - Continue Jardiance  10 mg daily  - Continue Torsemide  20 mg daily + KCl 20 mEq daily. Check BMP/BNP today  - Continue Losartan  25 mg daily  - Continue digoxin  0.125 mg daily. Check dig level  - Continue spiro 25 mg daily - Off beta blocker with history of low-output - Will get repeat echo   2. Paroxysmal A fib  - s/p DCCV to NSR 4/25 - Recurrent AF ~ 6 hrs on 05/25. Recurrent Afib detected in EP clinic 7/21. - On amio 200 bid. Decrease amio to 200 daily - Remains in AF - Will arrange for DC-CV next week. Has not mised Eliquis  - continue Eliquis  5 mg bid    3. CKD Stage IIIa - Baseline SCr 1.4-1.7 - Continue Jardiance  10 mg daily  - labs today   4. ?RV Lead vegetation - possible that this is due to the curl-i-cue that the lead forms in the right heart, as noted on CXR.  - No vegetation on TEE 7/24   5. PVCs - High PVC burden. ~ 50% during prior admission.  - Now better suppressed - Continue mexilitene 200 bid.  - Follows with EP   Toribio Fuel, MD  4:06 PM

## 2023-09-29 NOTE — Progress Notes (Signed)
 Pt called for pre procedure instructions. Arrival time 0730 NPO after midnight explained Instructed to take am meds with sip of water and confirmed blood thinner consistency, Eliquis . Instructed pt need for ride home tomorrow and have responsible adult with them for 24 hrs post procedure.

## 2023-10-02 ENCOUNTER — Other Ambulatory Visit: Payer: Self-pay

## 2023-10-02 ENCOUNTER — Encounter (HOSPITAL_COMMUNITY): Payer: Self-pay | Admitting: Internal Medicine

## 2023-10-02 ENCOUNTER — Encounter (HOSPITAL_COMMUNITY)
Admission: RE | Disposition: A | Discharge status: Home / Self Care | Payer: Self-pay | Source: Home / Self Care | Attending: Internal Medicine

## 2023-10-02 ENCOUNTER — Ambulatory Visit (HOSPITAL_COMMUNITY)
Admission: RE | Admit: 2023-10-02 | Discharge: 2023-10-02 | Disposition: A | Discharge status: Home / Self Care | Attending: Internal Medicine | Admitting: Internal Medicine

## 2023-10-02 ENCOUNTER — Ambulatory Visit (HOSPITAL_COMMUNITY): Admitting: Anesthesiology

## 2023-10-02 DIAGNOSIS — I110 Hypertensive heart disease with heart failure: Secondary | ICD-10-CM

## 2023-10-02 DIAGNOSIS — E785 Hyperlipidemia, unspecified: Secondary | ICD-10-CM | POA: Insufficient documentation

## 2023-10-02 DIAGNOSIS — N1831 Chronic kidney disease, stage 3a: Secondary | ICD-10-CM | POA: Diagnosis not present

## 2023-10-02 DIAGNOSIS — I4819 Other persistent atrial fibrillation: Secondary | ICD-10-CM

## 2023-10-02 DIAGNOSIS — Z87891 Personal history of nicotine dependence: Secondary | ICD-10-CM | POA: Diagnosis not present

## 2023-10-02 DIAGNOSIS — Z95810 Presence of automatic (implantable) cardiac defibrillator: Secondary | ICD-10-CM | POA: Insufficient documentation

## 2023-10-02 DIAGNOSIS — Z7901 Long term (current) use of anticoagulants: Secondary | ICD-10-CM | POA: Insufficient documentation

## 2023-10-02 DIAGNOSIS — Z79899 Other long term (current) drug therapy: Secondary | ICD-10-CM | POA: Insufficient documentation

## 2023-10-02 DIAGNOSIS — I480 Paroxysmal atrial fibrillation: Secondary | ICD-10-CM | POA: Diagnosis present

## 2023-10-02 DIAGNOSIS — I493 Ventricular premature depolarization: Secondary | ICD-10-CM | POA: Diagnosis not present

## 2023-10-02 DIAGNOSIS — I2510 Atherosclerotic heart disease of native coronary artery without angina pectoris: Secondary | ICD-10-CM

## 2023-10-02 DIAGNOSIS — I5022 Chronic systolic (congestive) heart failure: Secondary | ICD-10-CM | POA: Diagnosis not present

## 2023-10-02 DIAGNOSIS — I4891 Unspecified atrial fibrillation: Secondary | ICD-10-CM | POA: Diagnosis not present

## 2023-10-02 DIAGNOSIS — I5023 Acute on chronic systolic (congestive) heart failure: Secondary | ICD-10-CM

## 2023-10-02 DIAGNOSIS — I428 Other cardiomyopathies: Secondary | ICD-10-CM | POA: Diagnosis not present

## 2023-10-02 DIAGNOSIS — I130 Hypertensive heart and chronic kidney disease with heart failure and stage 1 through stage 4 chronic kidney disease, or unspecified chronic kidney disease: Secondary | ICD-10-CM | POA: Diagnosis not present

## 2023-10-02 HISTORY — PX: CARDIOVERSION: EP1203

## 2023-10-02 SURGERY — CARDIOVERSION (CATH LAB)
Anesthesia: General

## 2023-10-02 MED ORDER — PROPOFOL 10 MG/ML IV BOLUS
INTRAVENOUS | Status: DC | PRN
  Administered 2023-10-02: 50 mg via INTRAVENOUS

## 2023-10-02 MED ORDER — SODIUM CHLORIDE 0.9 % IV SOLN: INTRAVENOUS | Status: DC

## 2023-10-02 MED ORDER — LIDOCAINE HCL (PF) 2 % IJ SOLN
INTRAMUSCULAR | Status: DC | PRN
Start: 2023-10-02 — End: 2023-10-02
  Administered 2023-10-02: 60 mg via INTRADERMAL

## 2023-10-02 SURGICAL SUPPLY — 1 items: PAD DEFIB RADIO PHYSIO CONN (PAD) ×1 IMPLANT

## 2023-10-02 NOTE — Anesthesia Postprocedure Evaluation (Signed)
 Anesthesia Post Note  Patient: Austin [name]  Procedure(s) Performed: CARDIOVERSION     Patient location during evaluation: Cath Lab Anesthesia Type: General Level of consciousness: awake and alert Pain management: pain level controlled Vital Signs Assessment: post-procedure vital signs reviewed and stable Respiratory status: spontaneous breathing, nonlabored ventilation, respiratory function stable and patient connected to nasal cannula oxygen Cardiovascular status: blood pressure returned to baseline and stable Postop Assessment: no apparent nausea or vomiting Anesthetic complications: no   No notable events documented.  Last Vitals:  Vitals:   10/02/23 0731 10/02/23 0843  BP: 139/87 101/75  Pulse: 69   Resp: (!) 9 20  Temp: 36.6 C   SpO2: 100% 95%    Last Pain:  Vitals:   10/02/23 0731  TempSrc: Temporal                 Omaira Mellen S

## 2023-10-02 NOTE — Interval H&P Note (Signed)
 History and Physical Interval Note:  10/02/2023 8:44 AM  Austin [name]  has presented today for surgery, with the diagnosis of AFIB.  The various methods of treatment have been discussed with the patient and family. After consideration of risks, benefits and other options for treatment, the patient has consented to  Procedure(s): CARDIOVERSION (N/A) as a surgical intervention.  The patient's history has been reviewed, patient examined, no change in status, stable for surgery.  I have reviewed the patient's chart and labs.  Questions were answered to the patient's satisfaction.     [name]

## 2023-10-02 NOTE — CV Procedure (Signed)
    DIRECT CURRENT CARDIOVERSION  NAME:  Austin [name]   MRN: [id number] DOB:  July 20, 1955   ADMIT DATE: 10/02/2023   INDICATIONS: Atrial fibrillation    PROCEDURE:   Informed consent was obtained prior to the procedure. The risks, benefits and alternatives for the procedure were discussed and the patient comprehended these risks. Once an appropriate time out was taken, the patient had the defibrillator pads placed in the anterior and posterior position. The patient then underwent sedation by the anesthesia service. Once an appropriate level of sedation was achieved, the patient received a single biphasic, synchronized 200J shock with prompt conversion to sinus rhythm. No apparent complications.  Toribio Fuel, MD  8:40 AM

## 2023-10-02 NOTE — Anesthesia Preprocedure Evaluation (Signed)
 Anesthesia Evaluation  Patient identified by MRN, date of birth, ID band Patient awake    Reviewed: Allergy & Precautions, H&P , NPO status , Patient's Chart, lab work & pertinent test results  Airway Mallampati: II   Neck ROM: full    Dental   Pulmonary COPD, former smoker   breath sounds clear to auscultation       Cardiovascular hypertension, + CAD, + Past MI and +CHF  + Cardiac Defibrillator  Rhythm:irregular Rate:Normal     Neuro/Psych    GI/Hepatic   Endo/Other    Renal/GU Renal InsufficiencyRenal disease     Musculoskeletal   Abdominal   Peds  Hematology   Anesthesia Other Findings   Reproductive/Obstetrics                              Anesthesia Physical Anesthesia Plan  ASA: 4  Anesthesia Plan: General   Post-op Pain Management:    Induction: Intravenous  PONV Risk Score and Plan: 2 and Propofol  infusion and Treatment may vary due to age or medical condition  Airway Management Planned: Nasal Cannula  Additional Equipment:   Intra-op Plan:   Post-operative Plan:   Informed Consent: I have reviewed the patients History and Physical, chart, labs and discussed the procedure including the risks, benefits and alternatives for the proposed anesthesia with the patient or authorized representative who has indicated his/her understanding and acceptance.     Dental advisory given  Plan Discussed with: CRNA, Anesthesiologist and Surgeon  Anesthesia Plan Comments:         Anesthesia Quick Evaluation

## 2023-10-02 NOTE — Transfer of Care (Signed)
 Immediate Anesthesia Transfer of Care Note  Patient: Austin [name]  Procedure(s) Performed: CARDIOVERSION  Patient Location: Cath Lab  Anesthesia Type:General  Level of Consciousness: drowsy and patient cooperative  Airway & Oxygen Therapy: Patient Spontanous Breathing and Patient connected to nasal cannula oxygen  Post-op Assessment: Report given to RN and Post -op Vital signs reviewed and stable  Post vital signs: Reviewed and stable  Last Vitals:  Vitals Value Taken Time  BP 102/79 10/02/2023 0835  Temp 37 C 10/02/2023 0835  Pulse 62 10/02/2023 0835  Resp 14 10/02/2023 0835  SpO2 97% 10/02/2023 0835    Last Pain:  Vitals:   10/02/23 0731  TempSrc: Temporal         Complications: No notable events documented.

## 2023-11-26 NOTE — Progress Notes (Deleted)
 Cardiology Office Note:  .   Date:  11/26/2023  ID:  Austin [name], DOB 04-17-1955, MRN [id number] PCP: Nana Lauraine FORBES, NP  [facility] Providers Cardiologist:  Toribio Fuel, MD { EP: Dr. Fernande  History of Present Illness: .   KALIM KISSEL is a 68 y.o. male w/PMHx of  HTN, HLD, COPD, CKD (IIIa) CAD (June 2024, minimal CAD by cath) NICM AFib PVCs  EF initially down 2012 to 20%. EF improved in 2013 to 50-55%.  Cath (2015): LCX 20% OMI, otherwise ok.  Echo (2022): EF 25-30% RV normal.  Echo (03/2022): EF 15-20% RV normal.    Admitted 6/24 with a/c HF and AF.  Echo showed EF < 20% RV normal. R/LHC showed minimal CAD, severe NICM EF 10%, mildly elevated filling pressures and severely reduced CI 1.9.  AF was rate controlled, with frequent PVCs. Chemically converted to NSR on amio.  Plan to repeat echo in 4-6 weeks after GDMT and PVC suppression, with eye toward LVAD if EF not improved.   Admitted in 11/24 with cardiogenic shock.  Echo 11/24 EF < 20%. Treated with milrinone .   Device interrogation showed that he developed AV block in 10/24 and RV pacing went up > 70%. The long AV delay causing frequent paced beats and dropped beats as well. Underwent upgrade to CRT device with RA lead as well   Subsequent hospital say/ER visits SOB, treated for PNA, given diuretics in environment of skipping doses...  He saw Dr. Fernande 04/18/23, more good days then bad, newly anemic w/work up underway, pending plans/discussed DCCV Mentioning perhaps coming back in post DCCV to look at AV delays  He saw the AHF team 07/25/23, doing better, able to walk 1/2 mile without difficulty, reported medication compliance. LVEF remained low, planned for EP visit to see if device optimization could be done with QRS Some concerns he despite reporting good medication compliance, he also was not aware of what his meds were, so wondered if actually taking them all appropriately Might  eventually need advanced therapies  I saw him 08/28/23 He feels great! 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  Reports good exertional capacity Denies CP, palpitations or cardiac awareness No SOB, DOE, no near syncope or syncope No shocks No bleeding or signs of bleeding  He was in AFib since ~ 9mo EKG w/QRS of V-V opt > 154-118ms Advised DCCV/resume amio > he wanted to see Dr. Boone 1st  He saw AHF APP 09/11/23, feeling well, better,  Discussed off BB 2/2 low output Amiod started 200mg  BID, plan for DCCV ~ 3 weeks  10/02/23: DCCV was successful  Today's visit is scheduled as a 79mo visit ROS:   *** eliquis , dose, labs, bleeding *** amio labs *** AF burden *** BP% *** volume  Device information Biotronik single chamber ICD implanted 09/21/20 > upgrade to CRT-D 01/13/23 LV lead is an abbott wire  V-V optimization done July 2025 Noted all RV 1st programming resulted in wider QRS and provoked more PVCs  Arrhythmia/AAD hx Amiodarone  > appears to have been stopped ~ stopped Nov 2024 (unclear, suspect was 2/2 bradycardia/increased RV only pacing, pre-upgrade) >> resumed July 2025 (AFib) Mexiletine for PVCs started Nov 2024  Studies Reviewed: SABRA    EKG not done today 10/02/23: personally reviewed AV paced RBBB morphology, + lead I, QRS   DEVICE interrogation done today and  reviewed by myself *** Battery and lead measurements are good BP ***% ***  Echo 3/25 EF 20-25% RV ok   - TEE (7/24): LVEF < 20%, global HK, RV moderate HK, mild to moderate MR with posterior leaflet retracted, moderate TR, no LAA thrombus - R/LHC (6/24): minimal CAD, severe NICM EF 10% RA 9, PA 34/19 ( 27), PCWP 20 (v waves to 30), CO/CI (thermo) 4.3/1.9, PVR 1.6 - Echo (6/24): EF < 20%, echodensity on device in RV, RV moderately down, mild MR, ascending aorta 40 mm - Echo (2/24): EF 15-20% RV normal.   - Echo (2022): EF 25-30% RV normal.  - Cath (2015): LCX 20% OMI, otherwise ok.  - Echo (2013): EF 50-55% - Echo (2012): EF 20%   Risk Assessment/Calculations:    Physical Exam:   VS:  There were no vitals taken for this visit.   Wt Readings from Last 3 Encounters:  09/26/23 212 lb (96.2 kg)  09/11/23 212 lb 9.6 oz (96.4 kg)  08/28/23 211 lb 11.2 oz (96 kg)    GEN: Well nourished, well developed in no acute distress NECK: No JVD; No carotid bruits CARDIAC: *** RRR *** (paced), no murmurs, rubs, gallops RESPIRATORY:  *** CTA b/l without rales, wheezing or rhonchi  ABDOMEN: Soft, non-tender, non-distended EXTREMITIES:  *** No edema; No deformity   *** ICD site: is stable, no thinning, fluctuation, tethering  ASSESSMENT AND PLAN: .    persistent AFib CHA2DS2Vasc is 3, on Eliquis , *** appropriately dosed *** % burden   NICM Chronic CHF *** No symptoms or exam findings of volume *** C/w Dr. Boone   Secondary hypercoagulable state  ICD *** Intact function *** Programmed as above  PVCs On mexiletine BP ***% > presumed *** % burden   Dispo: *** sooner if needed.   Signed, Charlies Macario Arthur, PA-C

## 2023-11-27 ENCOUNTER — Ambulatory Visit: Attending: Physician Assistant | Admitting: Physician Assistant

## 2023-11-28 ENCOUNTER — Encounter: Payer: Self-pay | Admitting: Physician Assistant

## 2023-12-05 ENCOUNTER — Emergency Department (HOSPITAL_COMMUNITY)
Admission: EM | Admit: 2023-12-05 | Discharge: 2023-12-06 | Disposition: A | Attending: Emergency Medicine | Admitting: Emergency Medicine

## 2023-12-05 ENCOUNTER — Encounter (HOSPITAL_COMMUNITY): Payer: Self-pay

## 2023-12-05 ENCOUNTER — Other Ambulatory Visit: Payer: Self-pay

## 2023-12-05 DIAGNOSIS — I509 Heart failure, unspecified: Secondary | ICD-10-CM | POA: Diagnosis not present

## 2023-12-05 DIAGNOSIS — N189 Chronic kidney disease, unspecified: Secondary | ICD-10-CM | POA: Diagnosis not present

## 2023-12-05 DIAGNOSIS — Z7901 Long term (current) use of anticoagulants: Secondary | ICD-10-CM | POA: Insufficient documentation

## 2023-12-05 DIAGNOSIS — R109 Unspecified abdominal pain: Secondary | ICD-10-CM

## 2023-12-05 DIAGNOSIS — I482 Chronic atrial fibrillation, unspecified: Secondary | ICD-10-CM | POA: Insufficient documentation

## 2023-12-05 DIAGNOSIS — R1084 Generalized abdominal pain: Secondary | ICD-10-CM | POA: Insufficient documentation

## 2023-12-05 NOTE — ED Triage Notes (Signed)
 Says he generally does not feel well. Has had central chest pains that radiate left towards his defibrillator.   Says main pain is periumbilical and he has been unable to tolerate any foods x 2 days.   Subjective fevers at home.

## 2023-12-06 ENCOUNTER — Emergency Department (HOSPITAL_COMMUNITY)

## 2023-12-06 LAB — URINALYSIS, W/ REFLEX TO CULTURE (INFECTION SUSPECTED)
Bacteria, UA: NONE SEEN
Bilirubin Urine: NEGATIVE
Glucose, UA: >=500 mg/dL — AB
Hgb urine dipstick: NEGATIVE
Ketones, ur: NEGATIVE mg/dL
Leukocytes,Ua: NEGATIVE
Nitrite: NEGATIVE
Protein, ur: NEGATIVE mg/dL
Specific Gravity, Urine: >1.046 — ABNORMAL HIGH (ref 1.005–1.030)
pH: 6 (ref 5.0–8.0)

## 2023-12-06 LAB — CBC WITH DIFFERENTIAL/PLATELET
Abs Immature Granulocytes: 0.03 K/uL (ref 0.00–0.07)
Basophils Absolute: 0 K/uL (ref 0.0–0.1)
Basophils Relative: 0 %
Eosinophils Absolute: 0 K/uL (ref 0.0–0.5)
Eosinophils Relative: 0 %
HCT: 40.3 % (ref 39.0–52.0)
Hemoglobin: 12.4 g/dL — ABNORMAL LOW (ref 13.0–17.0)
Immature Granulocytes: 0 %
Lymphocytes Relative: 9 %
Lymphs Abs: 0.9 K/uL (ref 0.7–4.0)
MCH: 24.7 pg — ABNORMAL LOW (ref 26.0–34.0)
MCHC: 30.8 g/dL (ref 30.0–36.0)
MCV: 80.3 fL (ref 80.0–100.0)
Monocytes Absolute: 0.6 K/uL (ref 0.1–1.0)
Monocytes Relative: 6 %
Neutro Abs: 8.8 K/uL — ABNORMAL HIGH (ref 1.7–7.7)
Neutrophils Relative %: 85 %
Platelets: 219 K/uL (ref 150–400)
RBC: 5.02 MIL/uL (ref 4.22–5.81)
RDW: 21.9 % — ABNORMAL HIGH (ref 11.5–15.5)
Smear Review: NORMAL
WBC: 10.4 K/uL (ref 4.0–10.5)
nRBC: 0 % (ref 0.0–0.2)

## 2023-12-06 LAB — COMPREHENSIVE METABOLIC PANEL WITH GFR
ALT: 26 U/L (ref 0–44)
AST: 28 U/L (ref 15–41)
Albumin: 4.1 g/dL (ref 3.5–5.0)
Alkaline Phosphatase: 62 U/L (ref 38–126)
Anion gap: 14 (ref 5–15)
BUN: 19 mg/dL (ref 8–23)
CO2: 20 mmol/L — ABNORMAL LOW (ref 22–32)
Calcium: 9.8 mg/dL (ref 8.9–10.3)
Chloride: 105 mmol/L (ref 98–111)
Creatinine, Ser: 1.67 mg/dL — ABNORMAL HIGH (ref 0.61–1.24)
GFR, Estimated: 44 mL/min — ABNORMAL LOW (ref >60)
Glucose, Bld: 110 mg/dL — ABNORMAL HIGH (ref 70–99)
Potassium: 4.3 mmol/L (ref 3.5–5.1)
Sodium: 139 mmol/L (ref 135–145)
Total Bilirubin: 2.9 mg/dL — ABNORMAL HIGH (ref 0.0–1.2)
Total Protein: 6.8 g/dL (ref 6.5–8.1)

## 2023-12-06 LAB — RESP PANEL BY RT-PCR (RSV, FLU A&B, COVID)  RVPGX2
Influenza A by PCR: NEGATIVE
Influenza B by PCR: NEGATIVE
Resp Syncytial Virus by PCR: NEGATIVE
SARS Coronavirus 2 by RT PCR: NEGATIVE

## 2023-12-06 LAB — TROPONIN I (HIGH SENSITIVITY)
Troponin I (High Sensitivity): 47 ng/L — ABNORMAL HIGH (ref <18)
Troponin I (High Sensitivity): 44 ng/L — ABNORMAL HIGH (ref <18)

## 2023-12-06 LAB — DIGOXIN LEVEL: Digoxin Level: <0.6 ng/mL — ABNORMAL LOW (ref 0.8–2.0)

## 2023-12-06 LAB — BRAIN NATRIURETIC PEPTIDE: B Natriuretic Peptide: 889.2 pg/mL — ABNORMAL HIGH (ref 0.0–100.0)

## 2023-12-06 LAB — LIPASE, BLOOD: Lipase: 27 U/L (ref 11–51)

## 2023-12-06 MED ORDER — TORSEMIDE 20 MG PO TABS
20.0000 mg | ORAL_TABLET | Freq: Every day | ORAL | Status: DC
  Administered 2023-12-06: 20 mg via ORAL
  Filled 2023-12-06: qty 1

## 2023-12-06 MED ORDER — PANTOPRAZOLE SODIUM 40 MG IV SOLR
40.0000 mg | Freq: Once | INTRAVENOUS | Status: AC
  Administered 2023-12-06: 40 mg via INTRAVENOUS
  Filled 2023-12-06: qty 10

## 2023-12-06 MED ORDER — IOHEXOL 350 MG/ML SOLN
75.0000 mL | Freq: Once | INTRAVENOUS | Status: AC | PRN
  Administered 2023-12-06: 75 mL via INTRAVENOUS

## 2023-12-06 NOTE — Progress Notes (Signed)
 Heart Failure Navigator Progress Note  Assessed for Heart & Vascular TOC clinic readiness.  Patient does not meet criteria due to Advanced Heart Failure Team patient of Dr. [name]. .   Navigator will sign off at this time.   Rhae Hammock, BSN, Scientist, clinical (histocompatibility and immunogenetics) Only

## 2023-12-06 NOTE — Discharge Instructions (Signed)
 You have been seen and discharged from the emergency department.  Your blood work, heart workup and CAT scan was baseline for you.  Your urinalysis showed no infection.  Follow-up with your primary provider for further evaluation and further care. Take home medications as prescribed. If you have any worsening symptoms or further concerns for your health please return to an emergency department for further evaluation.

## 2023-12-06 NOTE — ED Provider Notes (Signed)
 [place] EMERGENCY DEPARTMENT AT [facility] Provider Note   CSN: [id number] Arrival date & time: 12/05/23  2328     Patient presents with: Abdominal Pain and Chest Pain   Austin [name] is a 68 y.o. male.   The history is provided by the patient and medical records.  Abdominal Pain Associated symptoms: chest pain   Chest Pain Associated symptoms: abdominal pain   Austin [name] is a 68 y.o. male who presents to the Emergency Department complaining of abdominal pain. He presents the emergency department for several days of generalized abdominal pain. He has been unable to eat send Sunday morning secondary to pain. His pain occasionally radiates to his defibrillator in his left chest and left upper back. His back pain waxes and wanes and is only present for about one minute at a time. His abdominal pain is constant. He has nausea, mild shortness of breath. He has mild cough. He has constipation. No vomiting, diarrhea. He is compliant with his medications but did have some confusion over his medications and had not taken his mexiletine, omeprazole  for three weeks and just restarted them on Monday. He does have a history of CHF with defibrillator, CKD, a fib on anticoagulation.  Prior to Admission medications   Medication Sig Start Date End Date Taking? Authorizing Provider  acetaminophen  (TYLENOL ) 325 MG tablet Take 2 tablets (650 mg total) by mouth every 4 (four) hours as needed for moderate pain (pain score 4-6). 01/14/23   Arrien, Mauricio Daniel, MD  amiodarone  (PACERONE ) 200 MG tablet Take 1 tablet (200 mg total) by mouth daily. 09/26/23   Bensimhon, Toribio SAUNDERS, MD  apixaban  (ELIQUIS ) 5 MG TABS tablet Take 1 tablet (5 mg total) by mouth 2 (two) times daily. Start taking on 01/17/23 pm 01/14/23   Arrien, Elidia Toribio, MD  digoxin  (LANOXIN ) 0.125 MG tablet Take 1 tablet (0.125 mg total) by mouth daily. 07/25/23   Colletta Manuelita Garre, PA-C  empagliflozin  (JARDIANCE ) 25 MG  TABS tablet Take by mouth daily.    [provider]  fenofibrate  (TRICOR ) 48 MG tablet Take 2 tablets by mouth Daily    [provider]  ferrous sulfate  325 (65 FE) MG tablet Take 1 tablet (325 mg total) by mouth every Monday, Wednesday, and Friday. In the morning Patient not taking: No sig reported 04/07/23   Bensimhon, Toribio SAUNDERS, MD  losartan  (COZAAR ) 25 MG tablet Take 1 tablet (25 mg total) by mouth daily. 07/25/23 10/23/23  Colletta Manuelita Garre, PA-C  melatonin 3 MG TABS tablet Take 6 mg by mouth at bedtime as needed (sleep). 02/03/22   [provider]  mexiletine (MEXITIL ) 200 MG capsule Take 1 capsule (200 mg total) by mouth every 12 (twelve) hours. 01/30/23   Bensimhon, Toribio SAUNDERS, MD  omeprazole  (PRILOSEC) 20 MG capsule Take 1 capsule (20 mg total) by mouth daily. 01/30/23   Bensimhon, Daniel R, MD  polyethylene glycol (MIRALAX  / GLYCOLAX ) 17 g packet Take 17 g by mouth daily as needed (constipation.). 07/14/23   Cheryle Page, MD  potassium chloride  SA (KLOR-CON  M) 20 MEQ tablet Take 2 tablets (40 mEq total) by mouth daily. 07/25/23   Colletta Manuelita Garre, PA-C  spironolactone  (ALDACTONE ) 25 MG tablet Take 1 tablet (25 mg total) by mouth daily. 07/14/23   Cheryle Page, MD  tamsulosin  (FLOMAX ) 0.4 MG CAPS capsule Take 1 capsule (0.4 mg total) by mouth daily after supper. 07/14/23   Cheryle Page, MD  torsemide  (DEMADEX ) 20 MG tablet  Take 1 tablet (20 mg total) by mouth daily. 07/25/23   Colletta Manuelita Garre, PA-C    Allergies: Lisinopril     Review of Systems  Cardiovascular:  Positive for chest pain.  Gastrointestinal:  Positive for abdominal pain.  All other systems reviewed and are negative.   Updated Vital Signs BP 123/89 (BP Location: Left Arm)   Pulse 70   Temp 97.7 F (36.5 C) (Oral)   Resp 20   SpO2 97%   Physical Exam Vitals and nursing note reviewed.  Constitutional:      Appearance: He is well-developed.  HENT:     Head: Normocephalic and  atraumatic.  Cardiovascular:     Rate and Rhythm: Normal rate and regular rhythm.     Heart sounds: No murmur heard. Pulmonary:     Effort: Pulmonary effort is normal. No respiratory distress.     Breath sounds: Normal breath sounds.  Abdominal:     Palpations: Abdomen is soft.     Tenderness: There is no abdominal tenderness. There is no guarding or rebound.  Musculoskeletal:        General: No swelling or tenderness.  Skin:    General: Skin is warm and dry.  Neurological:     Mental Status: He is alert and oriented to person, place, and time.  Psychiatric:        Behavior: Behavior normal.     (all labs ordered are listed, but only abnormal results are displayed) Labs Reviewed  CBC WITH DIFFERENTIAL/PLATELET - Abnormal; Notable for the following components:      Result Value   Hemoglobin 12.4 (*)    MCH 24.7 (*)    RDW 21.9 (*)    Neutro Abs 8.8 (*)    All other components within normal limits  COMPREHENSIVE METABOLIC PANEL WITH GFR - Abnormal; Notable for the following components:   CO2 20 (*)    Glucose, Bld 110 (*)    Creatinine, Ser 1.67 (*)    Total Bilirubin 2.9 (*)    GFR, Estimated 44 (*)    All other components within normal limits  BRAIN NATRIURETIC PEPTIDE - Abnormal; Notable for the following components:   B Natriuretic Peptide 889.2 (*)    All other components within normal limits  TROPONIN I (HIGH SENSITIVITY) - Abnormal; Notable for the following components:   Troponin I (High Sensitivity) 47 (*)    All other components within normal limits  TROPONIN I (HIGH SENSITIVITY) - Abnormal; Notable for the following components:   Troponin I (High Sensitivity) 44 (*)    All other components within normal limits  RESP PANEL BY RT-PCR (RSV, FLU A&B, COVID)  RVPGX2  LIPASE, BLOOD  DIGOXIN  LEVEL  URINALYSIS, W/ REFLEX TO CULTURE (INFECTION SUSPECTED)    EKG: EKG Interpretation Date/Time:  Tuesday December 05 2023 23:40:56 EDT Ventricular Rate:  71 PR  Interval:    QRS Duration:  152 QT Interval:  492 QTC Calculation: 534 R Axis:   -70  Text Interpretation: Ventricular-paced rhythm Biventricular pacemaker detected Abnormal ECG When compared with ECG of 02-Oct-2023 08:37, PREVIOUS ECG IS PRESENT Confirmed by Griselda Norris [phone]) on 12/06/2023 4:02:49 AM  Radiology: CT ABDOMEN PELVIS W CONTRAST Result Date: 12/06/2023 EXAM: CT ABDOMEN AND PELVIS WITH CONTRAST 12/06/2023 06:17:30 AM TECHNIQUE: CT of the abdomen and pelvis was performed with the administration of 75 mL of iohexol  (OMNIPAQUE ) 350 MG/ML injection. Multiplanar reformatted images are provided for review. Automated exposure control, iterative reconstruction, and/or weight-based adjustment of the mA/kV was  utilized to reduce the radiation dose to as low as reasonably achievable. COMPARISON: CTA chest abdomen and pelvis 07/11/2023, CT abdomen and pelvis 01/05/2023. CLINICAL HISTORY: 68 year old male with acute, nonlocalized abdominal pain, central chest pains, periumbilical pain, and inability to tolerate foods. FINDINGS: LOWER CHEST: Chronic cardiac pacemaker leads. Small layering right pleural effusion, with small bilateral pleural effusions on prior studies now regressed. No pericardial effusion. Increased confluent opacity in the right lower lobe and adjacent to the effusion, similar to the CT last year, increased since 07/11/2023. Right lower lobe pneumonia not excluded, but this might be round atelectasis. LIVER: Evidence of hepatic steatosis. Stable liver enhancement from last year, appears benign. GALLBLADDER AND BILE DUCTS: Gallbladder is unremarkable. No biliary ductal dilatation. SPLEEN: No acute abnormality. PANCREAS: No acute abnormality. ADRENAL GLANDS: No acute abnormality. KIDNEYS, URETERS AND BLADDER: Normal renal enhancement and early contrast excretion. No stones in the kidneys or ureters. No hydronephrosis. No perinephric or periureteral stranding. Lobulated prostatomegaly  and a vague hyperdense filling defect within the left base of the bladder on series 3 image 74 may be inseparable from the prostate. Chronic underlying bladder wall thickening. Appearance not significantly changed from last year. GI AND BOWEL: Stomach demonstrates no acute abnormality. Severe diverticulosis of the descending and sigmoid colon. Transverse colon diverticulosis. Diminutive and normal appendix on series 3 image 63. Nondilated small bowel. There is no bowel obstruction. PERITONEUM AND RETROPERITONEUM: No pneumoperitoneum, free fluid, or active mesenteric inflammation identified. VASCULATURE: Advanced aortoiliac atherosclerosis. Arterial tortuosity. Negative for abdominal aortic aneurysm. Major arterial structures remain patent. Portal venous system appears patent on the delayed images. LYMPH NODES: No lymphadenopathy. REPRODUCTIVE ORGANS: No pelvic mass. BONES AND SOFT TISSUES: Stable spine degeneration including mid lumbar spondylolisthesis, degenerative lumbosacral junction ankylosis. No focal soft tissue abnormality. IMPRESSION: 1. Increased confluent opacity in the right lower lobe adjacent to a small chronic right pleural effusion. Right lower lobe pneumonia not excluded, vs round atelectasis. 2. Chronically abnormal urinary bladder, with nonspecific bladder wall thickening, prostatomegaly, and apparent pedunculated filling defect at the left Bladder base (series 3 image 74) . Recommend Urology follow up and consideration of Cystoscopy. 3. Advanced colon diverticulosis but no convincing bowel inflammation. 4. No other acute or inflammatory process identified in the abdomen or pelvis. Electronically signed by: Helayne Hurst MD 12/06/2023 06:29 AM EDT RP Workstation: [id number]   DG Chest 2 View Result Date: 12/06/2023 CLINICAL DATA:  Chest pain EXAM: CHEST - 2 VIEW COMPARISON:  07/23/2023 FINDINGS: Cardiac shadow is prominent. Defibrillator is again noted. Patchy airspace opacity in the right base  is noted with small pleural effusion. No other focal abnormality is seen. IMPRESSION: Small right pleural effusion stable from the prior exam. Mild patchy airspace opacity right base. Electronically Signed   By: Oneil Devonshire M.D.   On: 12/06/2023 00:15     Procedures   Medications Ordered in the ED  pantoprazole  (PROTONIX ) injection 40 mg (40 mg Intravenous Given 12/06/23 0545)  iohexol  (OMNIPAQUE ) 350 MG/ML injection 75 mL (75 mLs Intravenous Contrast Given 12/06/23 0612)                                    Medical Decision Making Amount and/or Complexity of Data Reviewed Labs: ordered. Radiology: ordered.  Risk Prescription drug management.   Patient with the of CHF, CKD, a fib on anticoagulation here for evaluation of abdominal pain that radiates to his chest. He does not  have reproducible tenderness on examination. He does report ongoing pain. He is appropriately anticoagulated, low index of suspicion for mesenteric ischemia. CT abdomen pelvis was obtained, which does demonstrate a possible right lower lobe infiltrate as well as bladder wall thickening. Will check a urine. Troponins are elevated but flat, history of elevated troponins in the past. His chest pain sounds very atypical as it is a pain that radiates intermittently from his abdomen, not consistently reproducible. Does not change with activity. BNP is mildly elevated, patient does appear euvolemic on exam and has no respiratory distress. Patient care transferred pending urinalysis, digoxin  level and PO trial.     Final diagnoses:  None    ED Discharge Orders     None          Griselda Norris, MD 12/06/23 [phone]

## 2023-12-06 NOTE — ED Notes (Addendum)
 Pt denies any chest pain at this time. Pt endorses abdominal pain, nausea, and feeling shob. Pt states that he feels that if he was able to throw up, he would feel better. Pt aware a urine sample is needed, urinal at bedside.

## 2023-12-06 NOTE — ED Provider Notes (Signed)
 Patient signed out to me by previous provider. Please refer to their note for full HPI.  Briefly this is a 68 year old male who came in with complaints of intermittent chest pains, abdominal pain and generalized changes.  Initial blood work appears around baseline for the patient.  Troponins are slightly elevated but flat.  Bilirubin is slightly elevated but similar to fluctuations in the past.  CTs show chronic right pleural effusion, possible pneumonia as well as chronic ongoing bladder thickening.  Patient appears euvolemic, very well-appearing and not in acute heart failure exacerbation.  Cardiac workup otherwise stable for the patient.   Patient signed out pending urinalysis.   ED Course: Urinalysis is negative for infection.  Reevaluation of the patient continues to show a very well appearing patient in no acute distress, vitals are stable.  Will plan for outpatient follow-up.  Patient at this time appears safe and stable for discharge and close outpatient follow up. Discharge plan and strict return to ED precautions discussed, patient verbalizes understanding and agreement.   Bari Roxie HERO, DO 12/06/23 1340

## 2023-12-06 NOTE — ED Notes (Signed)
 Pt found to have pacemaker/defibrillator.. Unable to interrogate. Machine states pacemaker can not be found

## 2023-12-06 NOTE — ED Notes (Signed)
 Water given to pt, he is trying to get a urine sample.

## 2023-12-06 NOTE — ED Notes (Signed)
 Patient transported to CT

## 2023-12-06 NOTE — ED Notes (Addendum)
 Pt attempting to provide urine sample

## 2023-12-17 NOTE — Progress Notes (Unsigned)
 Cardiology Office Note:  .   Date:  12/17/2023  ID:  Austin [name], DOB December 17, 1955, MRN [id number] PCP: Austin Lauraine FORBES, NP  [facility] Providers Cardiologist:  Toribio Fuel, MD { EP: Dr. Fernande  History of Present Illness: .   Austin [name] is a 68 y.o. male w/PMHx of  HTN, HLD, COPD, CKD (IIIa) CAD (June 2024, minimal CAD by cath) NICM AFib PVCs  EF initially down 2012 to 20%. EF improved in 2013 to 50-55%.  Cath (2015): LCX 20% OMI, otherwise ok.  Echo (2022): EF 25-30% RV normal.  Echo (03/2022): EF 15-20% RV normal.    Admitted 6/24 with a/c HF and AF.  Echo showed EF < 20% RV normal. R/LHC showed minimal CAD, severe NICM EF 10%, mildly elevated filling pressures and severely reduced CI 1.9.  AF was rate controlled, with frequent PVCs. Chemically converted to NSR on amio.  Plan to repeat echo in 4-6 weeks after GDMT and PVC suppression, with eye toward LVAD if EF not improved.   Admitted in 11/24 with cardiogenic shock.  Echo 11/24 EF < 20%. Treated with milrinone .   Device interrogation showed that he developed AV block in 10/24 and RV pacing went up > 70%. The long AV delay causing frequent paced beats and dropped beats as well. Underwent upgrade to CRT device with RA lead as well   Subsequent hospital say/ER visits SOB, treated for PNA, given diuretics in environment of skipping doses...  He saw Dr. Fernande 04/18/23, more good days then bad, newly anemic w/work up underway, pending plans/discussed DCCV Mentioning perhaps coming back in post DCCV to look at AV delays  He saw the AHF team 07/25/23, doing better, able to walk 1/2 mile without difficulty, reported medication compliance. LVEF remained low, planned for EP visit to see if device optimization could be done with QRS Some concerns he despite reporting good medication compliance, he also was not aware of what his meds were, so wondered if actually taking them all appropriately Might eventually  need advanced therapies  I saw him 08/28/23 He feels great! 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  Reports good exertional capacity Denies CP, palpitations or cardiac awareness No SOB, DOE, no near syncope or syncope No shocks No bleeding or signs of bleeding  He was in AFib ~ 22mo EKG w/QRS of V-V opt > 154-170ms Advised DCCV/resume amio > he wanted to see Dr. Boone 1st  He saw AHF APP 09/11/23, feeling well, better Discussed off BB 2/2 low output Amiod started 200mg  BID, plan for DCCV ~ 3 weeks  10/02/23: DCCV was successful  ER visit 12/05/23 w/abd/CP CTs show chronic right pleural effusion, possible pneumonia as well as chronic ongoing bladder thickening. Patient appears euvolemic, very well-appearing and not in acute heart failure exacerbation.  UA was neg Mild lab abnormalities, c/w priors Felt stable for discharge  Today's visit is scheduled as a 79mo visit ROS:   He feels well Surprised to hear that he was out of rhythm.no CP, palpitations or cardiac awareness Reports good exertional capacity, continues to work, stay active. Denies SOB, minimal if any DOE No near syncope or syncope  He reports excellent medications compliance but was off his Eliquis  a few days ~ mo ago and again ~ 2.5 weeks ago for cataract surgery [facility] keeps a good [facility] on his [facility]s he says  He denies any CP, says had some aching in his low belly, no N/V, maybe cramping and is better Intermittently seemed to well up towards his chest, but no primary CP    Device information Biotronik single chamber ICD implanted 09/21/20 > upgrade to CRT-D 01/13/23 LV lead is an abbott wire  V-V optimization done July 2025 Noted all RV 1st programming resulted in wider QRS and provoked more PVCs  Arrhythmia/AAD hx Amiodarone  > appears to have been stopped ~ stopped Nov 2024 (unclear, suspect was 2/2  bradycardia/increased RV only pacing, pre-upgrade) >> resumed July 2025 (for AFib) Mexiletine for PVCs started Nov 2024  Studies Reviewed: SABRA    EKG not done today 12/05/23: personally reviewed AFib V paced, RBBB morphology + lead 1, QRS  10/02/23: personally reviewed AV paced RBBB morphology, + lead I, QRS   DEVICE interrogation done today and reviewed by myself Battery and lead measurements are good BP 97% One NSVT 100% AFib since 11/30/23 (V paced/rate controlled)  Echo 3/25 EF 20-25% RV ok   - TEE (7/24): LVEF < 20%, global HK, RV moderate HK, mild to moderate MR with posterior leaflet retracted, moderate TR, no LAA thrombus - R/LHC (6/24): minimal CAD, severe NICM EF 10% RA 9, PA 34/19 ( 27), PCWP 20 (v waves to 30), CO/CI (thermo) 4.3/1.9, PVR 1.6 - Echo (6/24): EF < 20%, echodensity on device in RV, RV moderately down, mild MR, ascending aorta 40 mm - Echo (2/24): EF 15-20% RV normal.  - Echo (2022): EF 25-30% RV normal.  - Cath (2015): LCX 20% OMI, otherwise ok.  - Echo (2013): EF 50-55% - Echo (2012): EF 20%   Risk Assessment/Calculations:    Physical Exam:   VS:  There were no vitals taken for this visit.   Wt Readings from Last 3 Encounters:  09/26/23 212 lb (96.2 kg)  09/11/23 212 lb 9.6 oz (96.4 kg)  08/28/23 211 lb 11.2 oz (96 kg)    GEN: Well nourished, well developed in no acute distress NECK: No JVD; No carotid bruits CARDIAC:  RRR (paced), no murmurs, rubs, gallops RESPIRATORY:  CTA b/l without rales, wheezing or rhonchi  ABDOMEN: Soft, non-tender, non-distended EXTREMITIES:  No edema; No deformity   ICD site: is stable, no thinning, fluctuation, tethering  ASSESSMENT AND PLAN: .    persistent AFib CHA2DS2Vasc is 3, on Eliquis , appropriately dosed Amiodarone  resumed July 2025 DCCV 10/02/23 100 % burden since 11/30/23  He is certain that in the last 2 weeks he has been compliant with his Eliquis  (maybe 2.5 weeks) Monday 11/10 >  increase his amiodarone  to 200mg  BID He would be agreeable to DCCV only if OK with Dr. Arvie he does it Would look towards ~ 2-3 weeks perhaps with the increased amio on board  He is asymptomatic currently with AFib, but given his CM > felt rhythm control if we are able is important.  The patient does mention that his PMD may be looking towards him getting a colonoscopy done though no firm/clear plans at this time He has an appt to see Dr. Cherrie 12/25/23 Will defer +/- DCCV toc their discussion > duration of BID amiodarone    NICM Chronic CHF BP 97% No symptoms or exam findings of volume C/w Dr. Boone   Secondary hypercoagulable state  ICD Intact function Programmed as above  PVCs On mexiletine 2% burden   Discussed Dr. Celine retirement > Dr. Kennyth is reading his remotes  Dispo: initially planned 3 mo f/u >  though have messaged our scheduler to put a 6 week f/u in place to ensure amiodarone  down titration, sooner if needed. Also noted that he has an appt to see Dr. Cherrie next week. I left a message for the patient making him aware of that appointment and /w him DCCV/timing/decisions/discussion.     Signed, Charlies Macario Arthur, PA-C

## 2023-12-19 ENCOUNTER — Ambulatory Visit: Attending: Physician Assistant | Admitting: Physician Assistant

## 2023-12-19 VITALS — BP 98/66 | HR 80 | Ht 76.0 in | Wt 202.0 lb

## 2023-12-19 DIAGNOSIS — I4819 Other persistent atrial fibrillation: Secondary | ICD-10-CM | POA: Diagnosis not present

## 2023-12-19 DIAGNOSIS — D6869 Other thrombophilia: Secondary | ICD-10-CM | POA: Diagnosis not present

## 2023-12-19 DIAGNOSIS — I428 Other cardiomyopathies: Secondary | ICD-10-CM

## 2023-12-19 DIAGNOSIS — Z95810 Presence of automatic (implantable) cardiac defibrillator: Secondary | ICD-10-CM

## 2023-12-19 DIAGNOSIS — I5022 Chronic systolic (congestive) heart failure: Secondary | ICD-10-CM | POA: Diagnosis not present

## 2023-12-19 NOTE — Patient Instructions (Addendum)
 Medication Instructions:    START MONDAY  12-25-2023  :  START TAKING  AMIODARONE   200 MG TWICE A  DAY     *If you need a refill on your cardiac medications before your next appointment, please call your pharmacy*  Lab Work:  . PLEASE GO DOWN STAIRS  [facility]  FIRST FLOOR   ( GET OFF ELEVATORS WALK TOWARDS WAITING AREA LAB LOCATED BY PHARMACY):   TSH TODAY     If you have labs (blood work) drawn today and your tests are completely normal, you will receive your results only by: MyChart Message (if you have MyChart) OR A paper copy in the mail If you have any lab test that is abnormal or we need to change your treatment, we will call you to review the results.  Testing/Procedures: NONE ORDERED  TODAY    Follow-Up: At [facility], you and your health needs are our priority.  As part of our continuing mission to provide you with exceptional heart care, our providers are all part of one team.  This team includes your primary Cardiologist (physician) and Advanced Practice Providers or APPs (Physician Assistants and Nurse Practitioners) who all work together to provide you with the care you need, when you need it.  Your next appointment:  3 month(s)   Provider:    Fonda Kitty, MD or Charlies Arthur, PA-C  ( CONTACT  CASSIE HALL/ ANGELINE HAMMER FOR EP SCHEDULING ISSUES )   We recommend signing up for the patient portal called MyChart.  Sign up information is provided on this After Visit Summary.  MyChart is used to connect with patients for Virtual Visits (Telemedicine).  Patients are able to view lab/test results, encounter notes, upcoming appointments, etc.  Non-urgent messages can be sent to your provider as well.   To learn more about what you can do with MyChart, go to [email or web address].   Other Instructions

## 2023-12-20 ENCOUNTER — Ambulatory Visit: Payer: Self-pay | Admitting: Physician Assistant

## 2023-12-20 LAB — CUP PACEART INCLINIC DEVICE CHECK
Battery Remaining Percentage: 100 %
Battery Voltage: 3.1 V
Brady Statistic AP VP Percent: 30 %
Brady Statistic AP VS Percent: 0 %
Brady Statistic AS VP Percent: 69 %
Brady Statistic AS VS Percent: 1 %
Brady Statistic RA Percent Paced: 13 %
Date Time Interrogation Session: 20251104084704
HighPow Impedance: 67 Ohm
Implantable Lead Connection Status: 753985
Implantable Lead Connection Status: 753985
Implantable Lead Connection Status: 753985
Implantable Lead Implant Date: 20220808
Implantable Lead Implant Date: 20241129
Implantable Lead Implant Date: 20241129
Implantable Lead Location: 753858
Implantable Lead Location: 753859
Implantable Lead Location: 753860
Implantable Lead Model: 436910
Implantable Lead Model: 5076
Implantable Lead Serial Number: 81450996
Implantable Pulse Generator Implant Date: 20241129
Lead Channel Impedance Value: 404 Ohm
Lead Channel Impedance Value: 483 Ohm
Lead Channel Impedance Value: 795 Ohm
Lead Channel Pacing Threshold Amplitude: 0.9 V
Lead Channel Pacing Threshold Amplitude: 1.2 V
Lead Channel Pacing Threshold Pulse Width: 0.4 ms
Lead Channel Pacing Threshold Pulse Width: 0.4 ms
Lead Channel Setting Pacing Amplitude: 2 V
Lead Channel Setting Pacing Amplitude: 2 V
Lead Channel Setting Pacing Amplitude: 2.5 V
Lead Channel Setting Pacing Pulse Width: 0.4 ms
Lead Channel Setting Pacing Pulse Width: 0.4 ms
Lead Channel Setting Sensing Sensitivity: 0.8 mV
Lead Channel Setting Sensing Sensitivity: 1.6 mV
Pulse Gen Model: 429522
Pulse Gen Serial Number: 84968923

## 2023-12-20 LAB — TSH: TSH: 2.23 u[IU]/mL (ref 0.450–4.500)

## 2023-12-22 ENCOUNTER — Telehealth (HOSPITAL_COMMUNITY): Payer: Self-pay | Admitting: Internal Medicine

## 2023-12-22 NOTE — Telephone Encounter (Signed)
 Called to confirm/remind patient of their appointment at the [facility] on 12/22/2023.   Appointment:   [x] Confirmed  [] Left mess   [] No answer/No voice mail  [] VM Full/unable to leave message  [] Phone not in service  Patient reminded to bring all medications and/or complete list.  Confirmed patient has transportation. Gave directions, instructed to utilize valet parking.

## 2023-12-24 NOTE — Progress Notes (Signed)
 ADV[facility]ED [facility] CLINIC NOTE   Primary Care: Nana Lauraine BRAVO, NP HF Cardiologist: Dr. Cherrie  Reason for Visit: f/u for chronic systolic heart failure   HPI: Mr. Tisdel is a 68 y.o. old with a history of chronic HFrEF, HTN, HLD, nonobstructive CAD 2012, COPD, CKD Stage III, PAF, and single chamber Biotronik .    EF initially down 2012 to 20%. EF improved in 2013 to 50-55%.    Cath (2015): LCX 20% OMI, otherwise ok.  Echo (2022): EF 25-30% RV normal.  Echo (03/2022): EF 15-20% RV normal.    Admitted to [facility] Jan, Feb, March, April, and May of 2024 with A/C HFrEF. Noted to be in SR in March 2024.  Admitted 6/24 with a/c HF and AF. Echo showed EF < 20% RV normal. R/LHC showed minimal CAD, severe NICM EF 10%, mildly elevated filling pressures and severely reduced CI 1.9. AF was rate controlled, with frequent PVCs. Chemically converted to NSR on amio. Plan to repeat echo in 4-6 weeks after GDMT and PVC suppression, with eye toward LVAD if EF not improved.   Admitted in 11/24 with cardiogenic shock. Echo 11/24 EF < 20%. Treated with milrinone . Device interrogation showed that he developed AV block in 10/24 and RV pacing went up > 70%.  Underwent upgrade to CRT device with RA lead as well    Echo 3/25 EF 20-25% RV ok.  In rate controlled AF 3/25, underwent DCCV to NSR 05/19/23.   He was admitted 5/25  with shortness of breath with respiratory distress, abdominal pain and volume overload. Initially required BiPAP. Treated empirically for PNA. He was diuresed with IV lasix . He had CTA which showed markedly enlarged prostate. He required foley placement.   Last [facility] visit, his EKG showed wide QRS 184 ms, NSR. Given EF remained severely reduced despite BiV pacing, he was sent back to EP to see if device could be further optimized. At EP visit, 7/14, he was noted to be in afib. Plan was to restart amiodarone   Underwent DC-CV 10/02/23  Seen by EP 12/19/23 back in AF since 11/30/23  (asymptomatic). Amio increased to 200 bid  He returns today for routine f/u. Says he feels fine. 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 No CP or SOB. No edema. Complaint with meds. No bleeding with Eliquis .   Echo today 12/25/23 EF 20-25% LV dilated RV normal Personally reviewed   EKG AF with v pacing at 70. Personally reviewed   Cardiac Studies - Echo 3/25 EF 20-25% RV ok   - TEE (7/24): LVEF < 20%, global HK, RV moderate HK, mild to moderate MR with posterior leaflet retracted, moderate TR, no LAA thrombus - R/LHC (6/24): minimal CAD, severe NICM EF 10% RA 9, PA 34/19 ( 27), PCWP 20 (v waves to 30), CO/CI (thermo) 4.3/1.9, PVR 1.6 - Echo (6/24): EF < 20%, echodensity on device in RV, RV moderately down, mild MR, ascending aorta 40 mm - Echo (2/24): EF 15-20% RV normal.  - Echo (2022): EF 25-30% RV normal.  - Cath (2015): LCX 20% OMI, otherwise ok.  - Echo (2013): EF 50-55% - Echo (2012): EF 20%  Past Medical History:  Diagnosis Date   Aortic atherosclerosis    Atrial fibrillation (HCC)    Benign prostatic hypertrophy    CKD (chronic kidney disease), stage III (HCC)    Coronary artery disease    Emphysema lung (HCC)    Former smoker    HFrEF (heart failure with reduced ejection fraction) (  HCC)    HTN (hypertension)    Hyperlipidemia    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   Single Chamber Biotronik   Myocardial infarction [facility]) 2012   12/31/2013   Nonischemic cardiomyopathy (HCC) 11/15/2010   cath 12/10/10 minimal nonobstructive coronary artery disease   Olecranon bursitis    Squamous cell carcinoma of arm, 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 (Patient taking differently: Take 200 mg by mouth 2 (two) times daily.) 30 tablet 3   apixaban  (ELIQUIS ) 5 MG TABS tablet Take 1 tablet (5 mg total) by mouth 2 (two) times daily. Start taking on 01/17/23 pm     digoxin  (LANOXIN ) 0.125 MG tablet Take 1 tablet (0.125 mg total) by mouth daily. 90 tablet 3   empagliflozin  (JARDIANCE ) 25 MG TABS tablet Take by mouth daily.     fenofibrate  (TRICOR ) 48 MG tablet Take 2 tablets by mouth Daily     ferrous sulfate  325 (65 FE) MG tablet Take 1 tablet (325 mg total) by mouth every Monday, Wednesday, and Friday. In the morning 30 tablet 11   losartan  (COZAAR ) 25 MG tablet Take 1 tablet (25 mg total) by mouth daily. 90 tablet 3   melatonin 3 MG TABS tablet Take 6 mg by mouth at bedtime as needed (sleep).     mexiletine (MEXITIL ) 200 MG capsule Take 1 capsule (200 mg total) by mouth every 12 (twelve) hours. 60 capsule 3   omeprazole  (PRILOSEC) 20 MG capsule Take 1 capsule (20 mg total) by mouth daily. 90 capsule 3   polyethylene glycol (MIRALAX  / GLYCOLAX ) 17 g packet Take 17 g by mouth daily as needed (constipation.).     potassium chloride  SA (KLOR-CON  M) 20 MEQ tablet Take 2 tablets (40 mEq total) by mouth daily. 180 tablet 3   spironolactone  (ALDACTONE ) 25 MG tablet Take 1 tablet (25 mg total) by mouth daily. 30 tablet 0   tamsulosin  (FLOMAX ) 0.4 MG CAPS capsule Take 1 capsule (0.4 mg total) by mouth daily after supper. 30 capsule 0   torsemide  (DEMADEX ) 20 MG tablet Take 1 tablet (20 mg total) by mouth daily. 90 tablet 3   acetaminophen  (TYLENOL ) 325 MG tablet Take 2 tablets (650 mg total) by mouth every 4 (four) hours as needed for moderate pain (pain score 4-6).     No current facility-administered medications for this encounter.   Allergies  Allergen Reactions   Lisinopril  Cough   Social History   Socioeconomic History   Marital status: Single    Spouse name: Not on file   Number of children: Not on file   Years of education: Not on file    Highest education level: Not on file  Occupational History   Not on file  Tobacco Use   Smoking status: Former    Current packs/day: 0.00    Average packs/day: 2.0 packs/day for 14.0 years (28.0 ttl pk-yrs)    Types: Cigarettes    Start date: 02/15/1979    Quit date: 02/14/1993    Years since quitting: 30.8   Smokeless tobacco: Not on file   Tobacco comments:    Former smoker 03/20/23  Substance and Sexual Activity   Alcohol use: Yes    Alcohol/week:  3.0 standard drinks of alcohol    Types: 1 Cans of beer, 2 Shots of liquor per week    Comment: once a month a beer or couple shots of bourbon 03/20/23   Drug use: Not Currently    Types: Marijuana    Comment: occ smokes marjuana 03/20/23   Sexual activity: Not on file  Other Topics Concern   Not on file  Social History Narrative   He is single and lives in [place].  He drives a refrigerator truck.   Social Drivers of Corporate Investment Banker Strain: Low Risk  (10/31/2023)   Received from [facility]   Overall Financial Resource Strain (CARDIA)    How hard is it for you to pay for the very basics like food, housing, medical care, and heating?: Not hard at all  Food Insecurity: No Food Insecurity (10/31/2023)   Received from [facility]   Hunger Vital Sign    Within the past 12 months, you worried that your food would run out before you got the money to buy more.: Never true    Within the past 12 months, the food you bought just didn't last and you didn't have money to get more.: Never true  Transportation Needs: No Transportation Needs (10/31/2023)   Received from [facility] - Transportation    In the past 12 months, has lack of transportation kept you from medical appointments or from getting medications?: No    In the past 12 months, has lack of transportation kept you from meetings, work, or from getting things needed for daily living?: No  Physical Activity: Insufficiently Active (10/31/2023)    Received from [facility]   Exercise Vital Sign    On average, how many days per week do you engage in moderate to strenuous exercise (like a brisk walk)?: 3 days    On average, how many minutes do you engage in exercise at this level?: 20 min  Stress: No Stress Concern Present (10/31/2023)   Received from [facility] - Occupational Stress Questionnaire    Do you feel stress - tense, restless, nervous, or anxious, or unable to sleep at night because your mind is troubled all the time - these days?: Only a little  Social Connections: Socially Integrated (10/31/2023)   Received from [facility]   Social Network    How would you rate your social network (family, work, friends)?: Good participation with social networks  Intimate Partner Violence: Not At Risk (10/31/2023)   Received from [facility]   HITS    Over the last 12 months how often did your partner physically hurt you?: Never    Over the last 12 months how often did your partner insult you or talk down to you?: Never    Over the last 12 months how often did your partner threaten you with physical harm?: Never    Over the last 12 months how often did your partner scream or curse at you?: Never   Family History  Problem Relation Age of Onset   Breast cancer Mother    Cancer Father    Heart attack Brother    Heart attack Brother 42   BP 96/62   Pulse 70   Ht 6' 4 (1.93 m)   Wt 91.1 kg (200 lb 12.8 oz)   SpO2 100%   BMI 24.44 kg/m   Wt Readings from Last 3 Encounters:  12/25/23 91.1 kg (200  lb 12.8 oz)  12/19/23 91.6 kg (202 lb)  09/26/23 96.2 kg (212 lb)   PHYSICAL EXAM: General:  Sitting up. No resp difficulty HEENT: normal Neck: supple. no JVD.  Cor: Regular rate & rhythm. No rubs, gallops or murmurs. Lungs: clear Abdomen: soft, nontender, nondistended.Good bowel sounds. Extremities: no cyanosis, clubbing, rash, edema Neuro: alert & orientedx3, cranial nerves  grossly intact. moves all 4 extremities w/o difficulty. Affect pleasant   ASSESSMENT & PLAN: 1. Chronic Systolic Heart Failure - NICM  - Initially diagnosed with HFrEF in 2012. EF previously improved to 50-55% on medications.  - Echo (6/24): EF now < 20%. He does have single chamber Biotronik.  - R/LHC (7/24) w/ minimal CAD, mildly elevated filling pressures and severely reduced CO/CI by thermo 4.3/1.9.  - Echo 11/24 EF < 20% -> shock - ICD interrogated 11/24 ->he developed AV block in 10/24 and RV pacing went up > 70% reculting in SR with long AV delay causing frequent paced beats and dropped beats as well.  - CRT upgrade 11/24 with a placement of an atrial lead  - Echo 05/08/23 LV markedly dilated EF 20-25% RV ok  - Echo 05/25: EF 20%, RV okay - Echo today 12/25/23 EF 20-25% LV dilated RV normal Personally reviewed - Much improved with CRT and suppression of PVCS - Stable NYHA II-early III - Volume ok  - Continue Jardiance  10 mg daily  - Continue Torsemide  20 mg daily + KCl 20 mEq daily. Check BMP/BNP today  - Continue Losartan  25 mg daily  - Continue digoxin  0.125 mg daily. Check dig level  - Continue spiro 25 mg daily - Off beta blocker with history of low-output - BP too low to titrate GDMT   2. Paroxysmal A fib  - s/p DCCV to NSR 4/25 - Recurrent AF ~ 6 hrs on 05/25. Recurrent Afib detected in EP clinic 7/21. - DC-CV 10/02/23 - Recurrent AF on device since 11/30/23 - Saw EP 12/19/23 amio increased to 200 bid - Remains in AF - continue Eliquis  5 mg bid - Seems to tolerating AF well. Will try DC-CV one more time. If fails, will pursue rate control     3. CKD Stage IIIa - Baseline SCr 1.4-1.7 - Continue Jardiance  10 mg daily  - Scr stable 1.67 on 12/06/23   4. ?RV Lead vegetation - possible that this is due to the curl-i-cue that the lead forms in the right heart, as noted on CXR.  - No vegetation on TEE 7/24   5. PVCs - High PVC burden. ~ 50% during prior admission.   - Now better suppressed - Continue mexilitene 200 bid. Suppressed on ECG today - Follows with EP   Toribio Fuel, MD  11:30 AM

## 2023-12-25 ENCOUNTER — Encounter (HOSPITAL_COMMUNITY): Payer: Self-pay | Admitting: Internal Medicine

## 2023-12-25 ENCOUNTER — Inpatient Hospital Stay (HOSPITAL_COMMUNITY)
Admission: RE | Admit: 2023-12-25 | Discharge: 2023-12-25 | Disposition: A | Source: Ambulatory Visit | Attending: Internal Medicine | Admitting: Internal Medicine

## 2023-12-25 ENCOUNTER — Other Ambulatory Visit (HOSPITAL_COMMUNITY): Payer: Self-pay | Admitting: *Deleted

## 2023-12-25 ENCOUNTER — Ambulatory Visit (HOSPITAL_COMMUNITY)
Admission: RE | Admit: 2023-12-25 | Discharge: 2023-12-25 | Disposition: A | Discharge status: Home / Self Care | Source: Ambulatory Visit | Attending: Internal Medicine | Admitting: Internal Medicine

## 2023-12-25 VITALS — BP 96/62 | HR 70 | Ht 76.0 in | Wt 200.8 lb

## 2023-12-25 DIAGNOSIS — I428 Other cardiomyopathies: Secondary | ICD-10-CM | POA: Insufficient documentation

## 2023-12-25 DIAGNOSIS — N1831 Chronic kidney disease, stage 3a: Secondary | ICD-10-CM | POA: Insufficient documentation

## 2023-12-25 DIAGNOSIS — I480 Paroxysmal atrial fibrillation: Secondary | ICD-10-CM | POA: Diagnosis not present

## 2023-12-25 DIAGNOSIS — I499 Cardiac arrhythmia, unspecified: Secondary | ICD-10-CM | POA: Insufficient documentation

## 2023-12-25 DIAGNOSIS — I5020 Unspecified systolic (congestive) heart failure: Secondary | ICD-10-CM

## 2023-12-25 DIAGNOSIS — I5022 Chronic systolic (congestive) heart failure: Secondary | ICD-10-CM | POA: Insufficient documentation

## 2023-12-25 DIAGNOSIS — Z87891 Personal history of nicotine dependence: Secondary | ICD-10-CM | POA: Diagnosis not present

## 2023-12-25 DIAGNOSIS — J449 Chronic obstructive pulmonary disease, unspecified: Secondary | ICD-10-CM | POA: Diagnosis not present

## 2023-12-25 DIAGNOSIS — Z79899 Other long term (current) drug therapy: Secondary | ICD-10-CM | POA: Insufficient documentation

## 2023-12-25 DIAGNOSIS — E785 Hyperlipidemia, unspecified: Secondary | ICD-10-CM | POA: Insufficient documentation

## 2023-12-25 DIAGNOSIS — I493 Ventricular premature depolarization: Secondary | ICD-10-CM | POA: Insufficient documentation

## 2023-12-25 DIAGNOSIS — N400 Enlarged prostate without lower urinary tract symptoms: Secondary | ICD-10-CM | POA: Diagnosis not present

## 2023-12-25 DIAGNOSIS — I340 Nonrheumatic mitral (valve) insufficiency: Secondary | ICD-10-CM | POA: Diagnosis not present

## 2023-12-25 DIAGNOSIS — I130 Hypertensive heart and chronic kidney disease with heart failure and stage 1 through stage 4 chronic kidney disease, or unspecified chronic kidney disease: Secondary | ICD-10-CM | POA: Insufficient documentation

## 2023-12-25 DIAGNOSIS — I2510 Atherosclerotic heart disease of native coronary artery without angina pectoris: Secondary | ICD-10-CM | POA: Insufficient documentation

## 2023-12-25 DIAGNOSIS — Z7984 Long term (current) use of oral hypoglycemic drugs: Secondary | ICD-10-CM | POA: Insufficient documentation

## 2023-12-25 DIAGNOSIS — Z950 Presence of cardiac pacemaker: Secondary | ICD-10-CM | POA: Diagnosis not present

## 2023-12-25 DIAGNOSIS — Z7901 Long term (current) use of anticoagulants: Secondary | ICD-10-CM | POA: Diagnosis not present

## 2023-12-25 DIAGNOSIS — I4819 Other persistent atrial fibrillation: Secondary | ICD-10-CM

## 2023-12-25 LAB — BASIC METABOLIC PANEL WITH GFR
Anion gap: 9 (ref 5–15)
BUN: 8 mg/dL (ref 8–23)
CO2: 24 mmol/L (ref 22–32)
Calcium: 9.3 mg/dL (ref 8.9–10.3)
Chloride: 107 mmol/L (ref 98–111)
Creatinine, Ser: 1.6 mg/dL — ABNORMAL HIGH (ref 0.61–1.24)
GFR, Estimated: 46 mL/min — ABNORMAL LOW (ref >60)
Glucose, Bld: 77 mg/dL (ref 70–99)
Potassium: 4.2 mmol/L (ref 3.5–5.1)
Sodium: 140 mmol/L (ref 135–145)

## 2023-12-25 LAB — ECHOCARDIOGRAM COMPLETE
Area-P 1/2: 5.02 cm2
Est EF: 20
MV VTI: 1.85 cm2
S' Lateral: 7.2 cm
Single Plane A4C EF: 24.6 %

## 2023-12-25 LAB — CBC
HCT: 41.7 % (ref 39.0–52.0)
Hemoglobin: 12.7 g/dL — ABNORMAL LOW (ref 13.0–17.0)
MCH: 25.3 pg — ABNORMAL LOW (ref 26.0–34.0)
MCHC: 30.5 g/dL (ref 30.0–36.0)
MCV: 83.2 fL (ref 80.0–100.0)
Platelets: 237 K/uL (ref 150–400)
RBC: 5.01 MIL/uL (ref 4.22–5.81)
RDW: 19.6 % — ABNORMAL HIGH (ref 11.5–15.5)
WBC: 6 K/uL (ref 4.0–10.5)
nRBC: 0 % (ref 0.0–0.2)

## 2023-12-25 NOTE — Progress Notes (Signed)
  Echocardiogram 2D Echocardiogram has been performed.  Koleen KANDICE Popper, RDCS 12/25/2023, 10:39 AM

## 2023-12-25 NOTE — Patient Instructions (Signed)
 Medication Changes:  None, continue current medications  Lab Work:  Labs done today, your results will be available in MyChart, we will contact you for abnormal readings.   Testing/Procedures:  Your physician has recommended that you have a Cardioversion (DCCV). Electrical Cardioversion uses a jolt of electricity to your heart either through paddles or wired patches attached to your chest. This is a controlled, usually prescheduled, procedure. Defibrillation is done under light anesthesia in the hospital, and you usually go home the day of the procedure. This is done to get your heart back into a normal rhythm. You are not awake for the procedure. Please see the instruction below  Special Instructions // Education:  Do the following things EVERYDAY: Weigh yourself in the morning before breakfast. Write it down and keep it in a log. Take your medicines as prescribed Eat low salt foods--Limit salt (sodium) to 2000 mg per day.  Stay as active as you can everyday Limit all fluids for the day to less than 2 liters       Dear Austin [name]  You are scheduled for a Cardioversion on Monday, December 1 with Dr. Bensimhon.    Please arrive at the [facility] (Main Entrance A) at [facility]: [address] [place], [place] [address] at 7:00 AM (This time is 1 hour(s) before your procedure to ensure your preparation).   Free valet parking service is available. You will check in at ADMITTING.   *Please Note: You will receive a call the day before your procedure to confirm the appointment time. That time may have changed from the original time based on the schedule for that day.*    DIET:  Nothing to eat or drink after midnight except a sip of water with medications (see medication instructions below)  MEDICATION INSTRUCTIONS: !!IF ANY NEW MEDICATIONS ARE STARTED AFTER TODAY, PLEASE NOTIFY YOUR PROVIDER AS SOON AS POSSIBLE!!  FYI: Medications such as Semaglutide (Ozempic,  Wegovy), Tirzepatide (Mounjaro, Zepbound), Dulaglutide (Trulicity), etc (GLP1 agonists) AND Canagliflozin (Invokana), Dapagliflozin  (Farxiga ), Empagliflozin  (Jardiance ), Ertugliflozin (Steglatro), Bexagliflozin [employer]) or any combination with one of these drugs such as Invokamet (Canagliflozin/Metformin), Synjardy (Empagliflozin /Metformin), etc (SGLT2 inhibitors) must be held around the time of a procedure. This is not a comprehensive list of all of these drugs. Please review all of your medications and talk to your provider if you take any one of these. If you are not sure, ask your provider.   HOLD: Empagliflozin  (Jardiance ) for 3 days prior to the procedure. Last dose on Thursday, November 27.   Continue taking your anticoagulant (blood thinner): Apixaban  (Eliquis ). DO NOT MISS ANY DOSES  LABS: DONE TODAY   FYI:  For your safety, and to allow us  to monitor your vital signs accurately during the surgery/procedure we request: If you have artificial nails, gel coating, SNS etc, please have those removed prior to your surgery/procedure. Not having the nail coverings /polish removed may result in cancellation or delay of your surgery/procedure.  Your support person will be asked to wait in the waiting room during your procedure.  It is OK to have someone drop you off and come back when you are ready to be discharged.  You cannot drive after the procedure and will need someone to drive you home.  Bring your insurance cards.  *Special Note: Every effort is made to have your procedure done on time. Occasionally there are emergencies that occur at the hospital that may cause delays. Please be patient if a delay does  occur.     Follow-Up in: 2 months   At the [facility], you and your health needs are our priority. We have a designated team specialized in the treatment of Heart Failure. This Care Team includes your primary Heart Failure Specialized Cardiologist (physician),  Advanced Practice Providers (APPs- Physician Assistants and Nurse Practitioners), and Pharmacist who all work together to provide you with the care you need, when you need it.   You may see any of the following providers on your designated Care Team at your next follow up:  Dr. Toribio Fuel Dr. Ezra Shuck Dr. Odis Brownie Greig Mosses, NP Caffie Shed, [place] [facility] [place], [place] Beckey Coe, NP Jordan Lee, NP Tinnie Redman, PharmD   Please be sure to bring in all your medications bottles to every appointment.   Need to Contact Us :  If you have any questions or concerns before your next appointment please send us  a message through [place] or call our office at [phone].    TO LEAVE A MESSAGE FOR THE NURSE SELECT OPTION 2, PLEASE LEAVE A MESSAGE INCLUDING: YOUR NAME DATE OF BIRTH CALL BACK NUMBER REASON FOR CALL**this is important as we prioritize the call backs  YOU WILL RECEIVE A CALL BACK THE SAME DAY AS LONG AS YOU CALL BEFORE 4:00 PM

## 2024-01-15 ENCOUNTER — Ambulatory Visit (HOSPITAL_COMMUNITY): Admission: RE | Admit: 2024-01-15 | Source: Home / Self Care | Admitting: Internal Medicine

## 2024-01-15 ENCOUNTER — Encounter (HOSPITAL_COMMUNITY): Admission: RE | Payer: Self-pay | Source: Home / Self Care

## 2024-01-15 DIAGNOSIS — I4819 Other persistent atrial fibrillation: Secondary | ICD-10-CM

## 2024-01-15 SURGERY — CARDIOVERSION (CATH LAB)
Anesthesia: Monitor Anesthesia Care

## 2024-01-28 NOTE — Progress Notes (Unsigned)
 Cardiology Office Note:  .   Date:  01/28/2024  ID:  Austin [name], DOB May 01, 1955, MRN [id number] PCP: Austin Lauraine FORBES, NP  [facility] Providers Cardiologist:  Toribio Fuel, MD { EP: Dr. Fernande  History of Present Illness: .   Austin [name] is a 68 y.o. male w/PMHx of  HTN, HLD, COPD, CKD (IIIa) CAD (June 2024, minimal CAD by cath) NICM AFib PVCs  EF initially down 2012 to 20%. EF improved in 2013 to 50-55%.  Cath (2015): LCX 20% OMI, otherwise ok.  Echo (2022): EF 25-30% RV normal.  Echo (03/2022): EF 15-20% RV normal.    Admitted 6/24 with a/c HF and AF.  Echo showed EF < 20% RV normal. R/LHC showed minimal CAD, severe NICM EF 10%, mildly elevated filling pressures and severely reduced CI 1.9.  AF was rate controlled, with frequent PVCs. Chemically converted to NSR on amio.  Plan to repeat echo in 4-6 weeks after GDMT and PVC suppression, with eye toward LVAD if EF not improved.   Admitted in 11/24 with cardiogenic shock.  Echo 11/24 EF < 20%. Treated with milrinone .   Device interrogation showed that he developed AV block in 10/24 and RV pacing went up > 70%. The long AV delay causing frequent paced beats and dropped beats as well. Underwent upgrade to CRT device with RA lead as well   Subsequent hospital say/ER visits SOB, treated for PNA, given diuretics in environment of skipping doses...  He saw Dr. Fernande 04/18/23, more good days then bad, newly anemic w/work up underway, pending plans/discussed DCCV Mentioning perhaps coming back in post DCCV to look at AV delays  He saw the AHF team 07/25/23, doing better, able to walk 1/2 mile without difficulty, reported medication compliance. LVEF remained low, planned for EP visit to see if device optimization could be done with QRS Some concerns he despite reporting good medication compliance, he also was not aware of what his meds were, so wondered if actually taking them all appropriately Might  eventually need advanced therapies  I saw him 08/28/23 He feels great! 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  Reports good exertional capacity Denies CP, palpitations or cardiac awareness No SOB, DOE, no near syncope or syncope No shocks No bleeding or signs of bleeding  He was in AFib ~ 13mo EKG w/QRS of V-V opt > 154-173ms Advised DCCV/resume amio > he wanted to see Dr. Boone 1st  He saw AHF APP 09/11/23, feeling well, better Discussed off BB 2/2 low output Amiod started 200mg  BID, plan for DCCV ~ 3 weeks  10/02/23: DCCV was successful  ER visit 12/05/23 w/abd/CP CTs show chronic right pleural effusion, possible pneumonia as well as chronic ongoing bladder thickening. Patient appears euvolemic, very well-appearing and not in acute heart failure exacerbation.  UA was neg Mild lab abnormalities, c/w priors Felt stable for discharge  I saw him 12/19/23 He feels well Surprised to hear that he was out of rhythm.no CP, palpitations or cardiac awareness Reports good exertional capacity, continues to work, stay active. Denies SOB, minimal if any DOE No near syncope or syncope He reports excellent medications compliance but was off his Eliquis  a few days ~ mo ago and again ~ 2.5 weeks ago for cataract surgery [facility] keeps a [facility] on [facility]s he says He denies any CP, says had some aching  in his low belly, no N/V, maybe cramping and is better Intermittently seemed to well up towards his chest, but no primary CP 100% AF Rec: Plan to increase amiodarone  > DCCV He preferred to d/w Dr. Cherrie Planned ~ 6 week f/u to ensure down-titration of amio Low PVC burden, mexiletine continued  He saw Dr. Bensimhon 12/25/23, amio continued > plans towards DCCV, if ails to maintain SR >> rate control DCCV scheduled 01/15/24 > canceled   Today's visit is scheduled as a planned f/u visit ROS:    *** AFib burden *** PVC burden *** amio dose *** DDCV canceled, ? why   Device information Biotronik single chamber ICD implanted 09/21/20 > upgrade to CRT-D 01/13/23 LV lead is an abbott wire  V-V optimization done July 2025 Noted all RV 1st programming resulted in wider QRS and provoked more PVCs  Arrhythmia/AAD hx Amiodarone  > appears to have been stopped ~ stopped Nov 2024 (unclear, suspect was 2/2 bradycardia/increased RV only pacing, pre-upgrade) >> resumed July 2025 (for AFib) Mexiletine for PVCs started Nov 2024  Studies Reviewed: SABRA    EKG not done today 12/05/23: personally reviewed AFib V paced, RBBB morphology + lead 1, QRS    Limited DEVICE interrogation done today and reviewed by myself *** Battery and auto lead measurements are good BP *** % *** Afib burden   12/25/23: TTE 1. Left ventricular ejection fraction, by estimation, is 20%. The left  ventricle has severely decreased function. The left ventricle demonstrates  regional wall motion abnormalities (see scoring diagram/findings for  description). The left ventricular  internal cavity size was severely dilated. Indeterminate diastolic filling  due to E-A fusion.   2. Right ventricular systolic function is normal. The right ventricular  size is normal. There is normal pulmonary artery systolic pressure.   3. Left atrial size was moderately dilated.   4. Right atrial size was mildly dilated.   5. The mitral valve is normal in structure. Mild mitral valve  regurgitation. No evidence of mitral stenosis.   6. The aortic valve is tricuspid. Aortic valve regurgitation is not  visualized. No aortic stenosis is present.   7. The inferior vena cava is dilated in size with >50% respiratory  variability, suggesting right atrial pressure of 8 mmHg.   Comparison(s): No significant change from prior study.    Echo 3/25 EF 20-25% RV ok   - TEE (7/24): LVEF < 20%, global HK, RV moderate HK, mild to  moderate MR with posterior leaflet retracted, moderate TR, no LAA thrombus - R/LHC (6/24): minimal CAD, severe NICM EF 10% RA 9, PA 34/19 ( 27), PCWP 20 (v waves to 30), CO/CI (thermo) 4.3/1.9, PVR 1.6 - Echo (6/24): EF < 20%, echodensity on device in RV, RV moderately down, mild MR, ascending aorta 40 mm - Echo (2/24): EF 15-20% RV normal.  - Echo (2022): EF 25-30% RV normal.  - Cath (2015): LCX 20% OMI, otherwise ok.  - Echo (2013): EF 50-55% - Echo (2012): EF 20%   Risk Assessment/Calculations:    Physical Exam:   VS:  There were no vitals taken for this visit.   Wt Readings from Last 3 Encounters:  12/25/23 200 lb 12.8 oz (91.1 kg)  12/19/23 202 lb (91.6 kg)  09/26/23 212 lb (96.2 kg)    GEN: Well nourished, well developed in no acute distress NECK: No JVD; No carotid bruits CARDIAC: *** RRR (paced), no murmurs, rubs, gallops RESPIRATORY: *** CTA b/l without rales, wheezing or rhonchi  ABDOMEN: Soft, non-tender, non-distended EXTREMITIES: *** No edema; No deformity   *** ICD site: is stable, no thinning, fluctuation, tethering  ASSESSMENT AND PLAN: .    persistent AFib CHA2DS2Vasc is 3, on Eliquis , appropriately dosed Amiodarone  resumed July 2025 DCCV 10/02/23 ***   NICM Chronic CHF BP ***% *** No symptoms or exam findings of volume C/w Dr. Boone   Secondary hypercoagulable state  ICD *** Intact function *** Programmed as above  PVCs On mexiletine *** % burden   Discussed Dr. Celine retirement > Dr. Kennyth is reading his remotes  Dispo: ***, sooner if needed      Signed, Charlies Macario Arthur, PA-C

## 2024-01-30 ENCOUNTER — Ambulatory Visit: Admitting: Physician Assistant

## 2024-01-30 VITALS — BP 120/64 | HR 70 | Ht 76.0 in | Wt 193.0 lb

## 2024-01-30 DIAGNOSIS — I493 Ventricular premature depolarization: Secondary | ICD-10-CM

## 2024-01-30 DIAGNOSIS — Z95810 Presence of automatic (implantable) cardiac defibrillator: Secondary | ICD-10-CM

## 2024-01-30 DIAGNOSIS — I4819 Other persistent atrial fibrillation: Secondary | ICD-10-CM

## 2024-01-30 DIAGNOSIS — I4811 Longstanding persistent atrial fibrillation: Secondary | ICD-10-CM

## 2024-01-30 DIAGNOSIS — D6869 Other thrombophilia: Secondary | ICD-10-CM

## 2024-01-30 NOTE — Patient Instructions (Signed)
 Medication Instructions:   Your physician recommends that you continue on your current medications as directed. Please refer to the Current Medication list given to you today.   *If you need a refill on your cardiac medications before your next appointment, please call your pharmacy*   Lab Work: NONE ORDERED  TODAY    If you have labs (blood work) drawn today and your tests are completely normal, you will receive your results only by: MyChart Message (if you have MyChart) OR A paper copy in the mail If you have any lab test that is abnormal or we need to change your treatment, we will call you to review the results.   Testing/Procedures: NONE ORDERED  TODAY     Follow-Up: At [facility], you and your health needs are our priority.  As part of our continuing mission to provide you with exceptional heart care, our providers are all part of one team.  This team includes your primary Cardiologist (physician) and Advanced Practice Providers or APPs (Physician Assistants and Nurse Practitioners) who all work together to provide you with the care you need, when you need it.  Your next appointment:    6 month(s)  Provider:    Fonda Kitty, MD     We recommend signing up for the patient portal called MyChart.  Sign up information is provided on this After Visit Summary.  MyChart is used to connect with patients for Virtual Visits (Telemedicine).  Patients are able to view lab/test results, encounter notes, upcoming appointments, etc.  Non-urgent messages can be sent to your provider as well.   To learn more about what you can do with MyChart, go to [email or web address].   Other Instructions

## 2024-01-31 ENCOUNTER — Ambulatory Visit: Payer: Self-pay | Admitting: Cardiology

## 2024-01-31 LAB — CUP PACEART INCLINIC DEVICE CHECK
Date Time Interrogation Session: 20251216150940
Implantable Lead Connection Status: 753985
Implantable Lead Connection Status: 753985
Implantable Lead Connection Status: 753985
Implantable Lead Implant Date: 20220808
Implantable Lead Implant Date: 20241129
Implantable Lead Implant Date: 20241129
Implantable Lead Location: 753858
Implantable Lead Location: 753859
Implantable Lead Location: 753860
Implantable Lead Model: 436910
Implantable Lead Model: 5076
Implantable Lead Serial Number: 81450996
Implantable Pulse Generator Implant Date: 20241129
Pulse Gen Model: 429522
Pulse Gen Serial Number: 84968923

## 2024-02-23 ENCOUNTER — Telehealth (HOSPITAL_COMMUNITY): Payer: Self-pay

## 2024-02-23 NOTE — Progress Notes (Incomplete)
 "  ADVANCED HF CLINIC NOTE   Primary Care: Nana Lauraine BRAVO, NP HF Cardiologist: Dr. Cherrie  Reason for Visit: f/u for chronic systolic heart failure   HPI: Austin [name] is a 69 y.o. old with a history of chronic HFrEF, HTN, HLD, nonobstructive CAD 2012, COPD, CKD Stage III, PAF, and single chamber Biotronik .    EF initially down 2012 to 20%. EF improved in 2013 to 50-55%.    Cath (2015): LCX 20% OMI, otherwise ok.  Echo (2022): EF 25-30% RV normal.  Echo (03/2022): EF 15-20% RV normal.    Admitted to [facility] Jan, Feb, March, April, and May of 2024 with A/C HFrEF. Noted to be in SR in March 2024.  Admitted 6/24 with a/c HF and AF. Echo showed EF < 20% RV normal. R/LHC showed minimal CAD, severe NICM EF 10%, mildly elevated filling pressures and severely reduced CI 1.9. AF was rate controlled, with frequent PVCs. Chemically converted to NSR on amio. Plan to repeat echo in 4-6 weeks after GDMT and PVC suppression, with eye toward LVAD if EF not improved.   Admitted in 11/24 with cardiogenic shock. Echo 11/24 EF < 20%. Treated with milrinone . Device interrogation showed that he developed AV block in 10/24 and RV pacing went up > 70%.  Underwent upgrade to CRT device with RA lead as well    Echo 3/25 EF 20-25% RV ok.  In rate controlled AF 3/25, underwent DCCV to NSR 05/19/23.   He was admitted 5/25  with shortness of breath with respiratory distress, abdominal pain and volume overload. Initially required BiPAP. Treated empirically for PNA. He was diuresed with IV lasix . He had CTA which showed markedly enlarged prostate. He required foley placement.   Last [facility] visit, his EKG showed wide QRS 184 ms, NSR. Given EF remained severely reduced despite BiV pacing, he was sent back to EP to see if device could be further optimized. At EP visit, 7/14, he was noted to be in afib. Plan was to restart amiodarone   Underwent DC-CV 10/02/23  Seen by EP 12/19/23 back in AF since 11/30/23  (asymptomatic). Amio increased to 200 bid  He returns today for routine f/u. Says he feels fine. 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 No CP or SOB. No edema. Complaint with meds. No bleeding with Eliquis .   Echo today 12/25/23 EF 20-25% LV dilated RV normal Personally reviewed   EKG AF with v pacing at 70. Personally reviewed   Cardiac Studies - Echo 3/25 EF 20-25% RV ok   - TEE (7/24): LVEF < 20%, global HK, RV moderate HK, mild to moderate MR with posterior leaflet retracted, moderate TR, no LAA thrombus - R/LHC (6/24): minimal CAD, severe NICM EF 10% RA 9, PA 34/19 ( 27), PCWP 20 (v waves to 30), CO/CI (thermo) 4.3/1.9, PVR 1.6 - Echo (6/24): EF < 20%, echodensity on device in RV, RV moderately down, mild MR, ascending aorta 40 mm - Echo (2/24): EF 15-20% RV normal.  - Echo (2022): EF 25-30% RV normal.  - Cath (2015): LCX 20% OMI, otherwise ok.  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   Single Chamber Biotronik   Myocardial infarction [facility]) 2012   12/31/2013   Nonischemic cardiomyopathy (HCC) 11/15/2010   cath 12/10/10 minimal nonobstructive coronary artery disease   Olecranon bursitis    Squamous cell carcinoma of arm, 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     amiodarone  (PACERONE ) 200 MG tablet Take 1 tablet (200 mg total) by mouth daily. (Patient taking differently: Take 200 mg by mouth 2 (two) times daily.) 30 tablet 3   apixaban  (ELIQUIS ) 5 MG TABS tablet Take 1 tablet (5 mg total) by mouth 2 (two) times daily. Start taking on 01/17/23 pm     digoxin  (LANOXIN ) 0.125 MG tablet Take 1 tablet (0.125 mg total) by mouth daily. 90 tablet 3   empagliflozin  (JARDIANCE ) 25 MG TABS tablet Take by mouth daily.     fenofibrate  (TRICOR ) 48 MG tablet Take 2 tablets by mouth Daily     ferrous sulfate  325 (65 FE) MG tablet Take 1 tablet (325 mg total) by mouth every Monday, Wednesday, and Friday. In the morning 30 tablet 11   losartan  (COZAAR ) 25 MG tablet Take 1 tablet (25 mg total) by mouth daily. 90 tablet 3   melatonin 3 MG TABS tablet Take 6 mg by mouth at bedtime as needed (sleep).     mexiletine (MEXITIL ) 200 MG capsule Take 1 capsule (200 mg total) by mouth every 12 (twelve) hours. 60 capsule 3   omeprazole  (PRILOSEC) 20 MG capsule Take 1 capsule (20 mg total) by mouth daily. 90 capsule 3   polyethylene glycol (MIRALAX  / GLYCOLAX ) 17 g packet Take 17 g by mouth daily as needed (constipation.).     potassium chloride  SA (KLOR-CON  M) 20 MEQ tablet Take 2 tablets (40 mEq total) by mouth daily. 180 tablet 3   spironolactone  (ALDACTONE ) 25 MG tablet Take 1 tablet (25 mg total) by mouth daily. 30 tablet 0   tamsulosin  (FLOMAX ) 0.4 MG CAPS capsule Take 1 capsule (0.4 mg total) by mouth daily after supper. 30 capsule 0   torsemide  (DEMADEX ) 20 MG tablet Take 1 tablet (20 mg total) by mouth daily. 90 tablet 3   No current facility-administered medications for this visit.   Allergies  Allergen Reactions   Lisinopril  Cough   Social History   Socioeconomic History   Marital status: Single    Spouse name: Not on file   Number of children: Not on file   Years of education: Not on file    Highest education level: Not on file  Occupational History   Not on file  Tobacco Use   Smoking status: Former    Current packs/day: 0.00    Average packs/day: 2.0 packs/day for 14.0 years (28.0 ttl pk-yrs)    Types: Cigarettes    Start date: 02/15/1979    Quit date: 02/14/1993    Years since quitting: 31.0   Smokeless tobacco: Not on file   Tobacco comments:    Former smoker 03/20/23  Substance and Sexual Activity   Alcohol use: Yes  Alcohol/week: 3.0 standard drinks of alcohol    Types: 1 Cans of beer, 2 Shots of liquor per week    Comment: once a month a beer or couple shots of bourbon 03/20/23   Drug use: Not Currently    Types: Marijuana    Comment: occ smokes marjuana 03/20/23   Sexual activity: Not on file  Other Topics Concern   Not on file  Social History Narrative   He is single and lives in [place].  He drives a refrigerator truck.   Social Drivers of Health   Tobacco Use: Medium Risk (02/19/2024)   Received from [facility]   Patient History    Smoking Tobacco Use: Former    Smokeless Tobacco Use: Never    Passive Exposure: Past  Physicist, Medical Strain: Low Risk (02/19/2024)   Received from [facility]   Overall Financial Resource Strain (CARDIA)    How hard is it for you to pay for the very basics like food, housing, medical care, and heating?: Not hard at all  Food Insecurity: No Food Insecurity (02/19/2024)   Received from [facility]   Epic    Within the past 12 months, you worried that your food would run out before you got the money to buy more.: Never true    Within the past 12 months, the food you bought just didn't last and you didn't have money to get more.: Never true  Transportation Needs: No Transportation Needs (02/19/2024)   Received from [facility]    In the past 12 months, has lack of transportation kept you from medical appointments or from getting medications?: No    In the past 12 months, has lack of transportation kept  you from meetings, work, or from getting things needed for daily living?: No  Physical Activity: Insufficiently Active (10/31/2023)   Received from [facility]   Exercise Vital Sign    On average, how many days per week do you engage in moderate to strenuous exercise (like a brisk walk)?: 3 days    On average, how many minutes do you engage in exercise at this level?: 20 min  Stress: No Stress Concern Present (10/31/2023)   Received from [facility] - Occupational Stress Questionnaire    Do you feel stress - tense, restless, nervous, or anxious, or unable to sleep at night because your mind is troubled all the time - these days?: Only a little  Social Connections: Socially Integrated (10/31/2023)   Received from [facility]   Social Network    How would you rate your social network (family, work, friends)?: Good participation with social networks  Intimate Partner Violence: Not At Risk (10/31/2023)   Received from [facility]   HITS    Over the last 12 months how often did your partner physically hurt you?: Never    Over the last 12 months how often did your partner insult you or talk down to you?: Never    Over the last 12 months how often did your partner threaten you with physical harm?: Never    Over the last 12 months how often did your partner scream or curse at you?: Never  Depression (PHQ2-9): Not on file  Alcohol Screen: Not on file  Housing: Low Risk (02/19/2024)   Received from [facility]    In the last 12 months, was there a time when you were not able to pay the mortgage  or rent on time?: No    In the past 12 months, how many times have you moved where you were living?: 0    At any time in the past 12 months, were you homeless or living in a shelter (including now)?: No  Utilities: Not At Risk (02/19/2024)   Received from [facility]    In the past 12 months has the electric, gas, oil, or water company  threatened to shut off services in your home?: No  Health Literacy: Not on file   Family History  Problem Relation Age of Onset   Breast cancer Mother    Cancer Father    Heart attack Brother    Heart attack Brother 42   There were no vitals taken for this visit.  Wt Readings from Last 3 Encounters:  01/30/24 87.5 kg (193 lb)  12/25/23 91.1 kg (200 lb 12.8 oz)  12/19/23 91.6 kg (202 lb)   PHYSICAL EXAM: General:  Sitting up. No resp difficulty HEENT: normal Neck: supple. no JVD.  Cor: Regular rate & rhythm. No rubs, gallops or murmurs. Lungs: clear Abdomen: soft, nontender, nondistended.Good bowel sounds. Extremities: no cyanosis, clubbing, rash, edema Neuro: alert & orientedx3, cranial nerves grossly intact. moves all 4 extremities w/o difficulty. Affect pleasant   ASSESSMENT & PLAN: 1. Chronic Systolic Heart Failure - NICM  - Initially diagnosed with HFrEF in 2012. EF previously improved to 50-55% on medications.  - Echo (6/24): EF now < 20%. He does have single chamber Biotronik.  - R/LHC (7/24) w/ minimal CAD, mildly elevated filling pressures and severely reduced CO/CI by thermo 4.3/1.9.  - Echo 11/24 EF < 20% -> shock - ICD interrogated 11/24 ->he developed AV block in 10/24 and RV pacing went up > 70% reculting in SR with long AV delay causing frequent paced beats and dropped beats as well.  - CRT upgrade 11/24 with a placement of an atrial lead  - Echo 05/08/23 LV markedly dilated EF 20-25% RV ok  - Echo 05/25: EF 20%, RV okay - Echo today 12/25/23 EF 20-25% LV dilated RV normal Personally reviewed - Much improved with CRT and suppression of PVCS - Stable NYHA II-early III - Volume ok  - Continue Jardiance  10 mg daily  - Continue Torsemide  20 mg daily + KCl 20 mEq daily. Check BMP/BNP today  - Continue Losartan  25 mg daily  - Continue digoxin  0.125 mg daily. Check dig level  - Continue spiro 25 mg daily - Off beta blocker with history of low-output - BP too low  to titrate GDMT   2. Paroxysmal A fib  - s/p DCCV to NSR 4/25 - Recurrent AF ~ 6 hrs on 05/25. Recurrent Afib detected in EP clinic 7/21. - DC-CV 10/02/23 - Recurrent AF on device since 11/30/23 - Saw EP 12/19/23 amio increased to 200 bid - Remains in AF - continue Eliquis  5 mg bid - Seems to tolerating AF well. Will try DC-CV one more time. If fails, will pursue rate control     3. CKD Stage IIIa - Baseline SCr 1.4-1.7 - Continue Jardiance  10 mg daily  - Scr stable 1.67 on 12/06/23   4. ?RV Lead vegetation - possible that this is due to the curl-i-cue that the lead forms in the right heart, as noted on CXR.  - No vegetation on TEE 7/24   5. PVCs - High PVC burden. ~ 50% during prior admission.  - Now better suppressed - Continue mexilitene 200  bid. Suppressed on ECG today - Follows with EP   Harlene CHRISTELLA Gainer, FNP  4:34 PM  "

## 2024-02-23 NOTE — Telephone Encounter (Signed)
 Called to confirm/remind patient of their appointment at [facility] on 02/26/24.   Appointment:   [] Confirmed  [x] Left mess   [] No answer/No voice mail  [] VM Full/unable to leave message  [] Phone not in service  And to bring in all medications and/or complete list.

## 2024-02-26 ENCOUNTER — Ambulatory Visit (HOSPITAL_COMMUNITY)

## 2024-03-04 ENCOUNTER — Ambulatory Visit (HOSPITAL_COMMUNITY)
Admission: RE | Admit: 2024-03-04 | Discharge: 2024-03-04 | Disposition: A | Source: Ambulatory Visit | Attending: Cardiology

## 2024-03-04 ENCOUNTER — Ambulatory Visit (HOSPITAL_COMMUNITY): Payer: Self-pay | Admitting: Cardiology

## 2024-03-04 ENCOUNTER — Encounter (HOSPITAL_COMMUNITY): Payer: Self-pay

## 2024-03-04 VITALS — BP 120/91 | HR 70 | Wt 193.2 lb

## 2024-03-04 DIAGNOSIS — N1831 Chronic kidney disease, stage 3a: Secondary | ICD-10-CM | POA: Insufficient documentation

## 2024-03-04 DIAGNOSIS — I130 Hypertensive heart and chronic kidney disease with heart failure and stage 1 through stage 4 chronic kidney disease, or unspecified chronic kidney disease: Secondary | ICD-10-CM | POA: Insufficient documentation

## 2024-03-04 DIAGNOSIS — J449 Chronic obstructive pulmonary disease, unspecified: Secondary | ICD-10-CM | POA: Insufficient documentation

## 2024-03-04 DIAGNOSIS — Z95810 Presence of automatic (implantable) cardiac defibrillator: Secondary | ICD-10-CM | POA: Insufficient documentation

## 2024-03-04 DIAGNOSIS — I2510 Atherosclerotic heart disease of native coronary artery without angina pectoris: Secondary | ICD-10-CM | POA: Insufficient documentation

## 2024-03-04 DIAGNOSIS — Z79899 Other long term (current) drug therapy: Secondary | ICD-10-CM | POA: Insufficient documentation

## 2024-03-04 DIAGNOSIS — I493 Ventricular premature depolarization: Secondary | ICD-10-CM | POA: Insufficient documentation

## 2024-03-04 DIAGNOSIS — Z87891 Personal history of nicotine dependence: Secondary | ICD-10-CM | POA: Insufficient documentation

## 2024-03-04 DIAGNOSIS — Z7984 Long term (current) use of oral hypoglycemic drugs: Secondary | ICD-10-CM | POA: Insufficient documentation

## 2024-03-04 DIAGNOSIS — I5022 Chronic systolic (congestive) heart failure: Secondary | ICD-10-CM | POA: Insufficient documentation

## 2024-03-04 DIAGNOSIS — E785 Hyperlipidemia, unspecified: Secondary | ICD-10-CM | POA: Insufficient documentation

## 2024-03-04 DIAGNOSIS — I4819 Other persistent atrial fibrillation: Secondary | ICD-10-CM | POA: Diagnosis not present

## 2024-03-04 DIAGNOSIS — Z7901 Long term (current) use of anticoagulants: Secondary | ICD-10-CM | POA: Insufficient documentation

## 2024-03-04 LAB — BASIC METABOLIC PANEL WITH GFR
Anion gap: 10 (ref 5–15)
BUN: 25 mg/dL — ABNORMAL HIGH (ref 8–23)
CO2: 25 mmol/L (ref 22–32)
Calcium: 9.5 mg/dL (ref 8.9–10.3)
Chloride: 107 mmol/L (ref 98–111)
Creatinine, Ser: 1.54 mg/dL — ABNORMAL HIGH (ref 0.61–1.24)
GFR, Estimated: 48 mL/min — ABNORMAL LOW
Glucose, Bld: 99 mg/dL (ref 70–99)
Potassium: 5.7 mmol/L — ABNORMAL HIGH (ref 3.5–5.1)
Sodium: 142 mmol/L (ref 135–145)

## 2024-03-04 LAB — CBC
HCT: 39.4 % (ref 39.0–52.0)
Hemoglobin: 11.7 g/dL — ABNORMAL LOW (ref 13.0–17.0)
MCH: 24.4 pg — ABNORMAL LOW (ref 26.0–34.0)
MCHC: 29.7 g/dL — ABNORMAL LOW (ref 30.0–36.0)
MCV: 82.3 fL (ref 80.0–100.0)
Platelets: 221 K/uL (ref 150–400)
RBC: 4.79 MIL/uL (ref 4.22–5.81)
RDW: 17.1 % — ABNORMAL HIGH (ref 11.5–15.5)
WBC: 10.6 K/uL — ABNORMAL HIGH (ref 4.0–10.5)
nRBC: 0 % (ref 0.0–0.2)

## 2024-03-04 LAB — DIGOXIN LEVEL: Digoxin Level: 0.8 ng/mL (ref 0.8–2.0)

## 2024-03-04 LAB — PRO BRAIN NATRIURETIC PEPTIDE: Pro Brain Natriuretic Peptide: 6931 pg/mL — ABNORMAL HIGH

## 2024-03-04 MED ORDER — AMIODARONE HCL 200 MG PO TABS: 200.0000 mg | ORAL_TABLET | Freq: Two times a day (BID) | ORAL | 5 refills | Status: AC

## 2024-03-04 NOTE — Progress Notes (Signed)
"   ReDS Vest / Clip - 03/04/24 1200       ReDS Vest / Clip   Station Marker C    Ruler Value 31    ReDS Value Range Low volume    ReDS Actual Value 35        \ "

## 2024-03-04 NOTE — Progress Notes (Signed)
 "  ADVANCED HF CLINIC NOTE   Primary Care: Nana Lauraine BRAVO, NP HF Cardiologist: Dr. Cherrie  Reason for Visit: f/u for chronic systolic heart failure   HPI: Austin [name] is a 69 y.o. old with a history of chronic HFrEF, HTN, HLD, nonobstructive CAD 2012, COPD, CKD Stage III, PAF, and single chamber Biotronik.    EF initially down 2012 to 20%. EF improved in 2013 to 50-55%.    Cath (2015): LCX 20% OMI, otherwise ok.  Echo (2022): EF 25-30% RV normal.  Echo (03/2022): EF 15-20% RV normal.    Admitted to [facility] Jan, Feb, March, April, and May of 2024 with A/C HFrEF. Noted to be in SR in March 2024.  Admitted 6/24 with a/c HF and AF. Echo showed EF < 20% RV normal. R/LHC showed minimal CAD, severe NICM EF 10%, mildly elevated filling pressures and severely reduced CI 1.9. AF was rate controlled, with frequent PVCs. Chemically converted to NSR on amio. Plan to repeat echo in 4-6 weeks after GDMT and PVC suppression, with eye toward LVAD if EF not improved.   Admitted in 11/24 with cardiogenic shock. Echo 11/24 EF < 20%. Treated with milrinone . Device interrogation showed that he developed AV block in 10/24 and RV pacing went up > 70%.  Underwent upgrade to CRT device with RA lead as well    Echo 3/25 EF 20-25% RV ok.  In rate controlled AF 3/25, underwent DCCV to NSR 05/19/23.   He was admitted 5/25  with shortness of breath with respiratory distress, abdominal pain and volume overload. Initially required BiPAP. Treated empirically for PNA. He was diuresed with IV lasix . He had CTA which showed markedly enlarged prostate. He required foley placement.   Last [facility] visit, his EKG showed wide QRS 184 ms, NSR. Given EF remained severely reduced despite BiV pacing, he was sent back to EP to see if device could be further optimized. At EP visit, 7/14, he was noted to be in afib. Plan was to restart amiodarone   Underwent DC-CV 10/02/23  Seen by EP 12/19/23 back in AF since 11/30/23 (asymptomatic).  Amio increased to 200 bid  Last AHF clinic visit 11/25, echo showed EF 20-25%, dilated LV, RV normal. EKG showed persistent AF w/ v pacing at 70. He was scheduled for repeat outpatient DCCV but pt canceled and never rescheduled.   He now returns back for f/u. Reports doing fairly well. 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 Getting round ok. No resting dyspnea, no CP but notes slight increase in exertional dyspnea, NYHA Class II-early III. No LEE. ReDs 35%.   EKG today shows persistent AF, V-paced 70 bpm. Reports full med compliance. No missed doses of eliquis .    Cardiac Studies - Echo 11/25; EF 20-25%, RV ok  - Echo 5/25 EF 20-25% RV ok   - TEE (7/24): LVEF < 20%, global HK, RV moderate HK, mild to moderate MR with posterior leaflet retracted, moderate TR, no LAA thrombus - R/LHC (6/24): minimal CAD, severe NICM EF 10% RA 9, PA 34/19 ( 27), PCWP 20 (v waves to 30), CO/CI (thermo) 4.3/1.9, PVR 1.6 - Echo (6/24): EF < 20%, echodensity on device in RV, RV moderately down, mild MR, ascending aorta 40 mm - Echo (2/24): EF 15-20% RV normal.  - Echo (2022): EF 25-30% RV normal.  - Cath (2015): LCX 20% OMI, otherwise ok.  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   Single Chamber Biotronik   Myocardial infarction [facility]) 2012   12/31/2013   Nonischemic cardiomyopathy (HCC) 11/15/2010   cath 12/10/10 minimal nonobstructive coronary artery disease   Olecranon bursitis    Squamous cell carcinoma of arm, 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     amiodarone  (PACERONE ) 200 MG tablet Take 1 tablet (200 mg total) by mouth daily. 30 tablet 3   apixaban  (ELIQUIS ) 5 MG TABS tablet Take 1 tablet (5 mg total) by mouth 2 (two) times daily. Start taking on 01/17/23 pm     digoxin  (LANOXIN ) 0.125 MG tablet Take 1 tablet (0.125 mg total) by mouth daily. 90 tablet 3   empagliflozin  (JARDIANCE ) 25 MG TABS tablet Take by mouth daily.     fenofibrate  (TRICOR ) 48 MG tablet Take 2 tablets by mouth Daily     ferrous sulfate  325 (65 FE) MG tablet Take 1 tablet (325 mg total) by mouth every Monday, Wednesday, and Friday. In the morning 30 tablet 11   losartan  (COZAAR ) 25 MG tablet Take 1 tablet (25 mg total) by mouth daily. 90 tablet 3   melatonin 3 MG TABS tablet Take 6 mg by mouth at bedtime as needed (sleep).     mexiletine (MEXITIL ) 200 MG capsule Take 1 capsule (200 mg total) by mouth every 12 (twelve) hours. 60 capsule 3   omeprazole  (PRILOSEC) 20 MG capsule Take 1 capsule (20 mg total) by mouth daily. 90 capsule 3   polyethylene glycol (MIRALAX  / GLYCOLAX ) 17 g packet Take 17 g by mouth daily as needed (constipation.).     potassium chloride  SA (KLOR-CON  M) 20 MEQ tablet Take 2 tablets (40 mEq total) by mouth daily. 180 tablet 3   spironolactone  (ALDACTONE ) 25 MG tablet Take 1 tablet (25 mg total) by mouth daily. 30 tablet 0   tamsulosin  (FLOMAX ) 0.4 MG CAPS capsule Take 1 capsule (0.4 mg total) by mouth daily after supper. 30 capsule 0   torsemide  (DEMADEX ) 20 MG tablet Take 1 tablet (20 mg total) by mouth daily. 90 tablet 3   No current facility-administered medications for this encounter.   Allergies   Allergen Reactions   Lisinopril  Cough   Social History   Socioeconomic History   Marital status: Single    Spouse name: Not on file   Number of children: Not on file   Years of education: Not on file   Highest education level: Not on file  Occupational History   Not on file  Tobacco Use   Smoking status: Former    Current packs/day: 0.00    Average packs/day: 2.0 packs/day for 14.0 years (28.0 ttl pk-yrs)    Types: Cigarettes    Start date: 02/15/1979  Quit date: 02/14/1993    Years since quitting: 31.0   Smokeless tobacco: Not on file   Tobacco comments:    Former smoker 03/20/23  Substance and Sexual Activity   Alcohol use: Yes    Alcohol/week: 3.0 standard drinks of alcohol    Types: 1 Cans of beer, 2 Shots of liquor per week    Comment: once a month a beer or couple shots of bourbon 03/20/23   Drug use: Not Currently    Types: Marijuana    Comment: occ smokes marjuana 03/20/23   Sexual activity: Not on file  Other Topics Concern   Not on file  Social History Narrative   He is single and lives in [place].  He drives a refrigerator truck.   Social Drivers of Health   Tobacco Use: Medium Risk (03/04/2024)   Patient History    Smoking Tobacco Use: Former    Smokeless Tobacco Use: Unknown    Passive Exposure: Not on file  Financial Resource Strain: Low Risk (02/19/2024)   Received from [facility]   Overall Financial Resource Strain (CARDIA)    How hard is it for you to pay for the very basics like food, housing, medical care, and heating?: Not hard at all  Food Insecurity: No Food Insecurity (02/19/2024)   Received from [facility]   Epic    Within the past 12 months, you worried that your food would run out before you got the money to buy more.: Never true    Within the past 12 months, the food you bought just didn't last and you didn't have money to get more.: Never true  Transportation Needs: No Transportation Needs (02/19/2024)   Received from [facility]    In the past 12 months, has lack of transportation kept you from medical appointments or from getting medications?: No    In the past 12 months, has lack of transportation kept you from meetings, work, or from getting things needed for daily living?: No  Physical Activity: Insufficiently Active (10/31/2023)   Received from [facility]   Exercise Vital Sign    On average, how many days per week do you engage in moderate to strenuous exercise (like a brisk walk)?: 3 days    On average, how many minutes do you engage in exercise at this level?: 20 min  Stress: No Stress Concern Present (10/31/2023)   Received from [facility] Occupational [facility] - Occupational Stress Questionnaire    Do you feel stress - tense, restless, nervous, or anxious, or unable to sleep at night because your mind is troubled all the time - these days?: Only a little  Social Connections: Socially Integrated (10/31/2023)   Received from [facility]   Social Network    How would you rate your social network (family, work, friends)?: Good participation with social networks  Intimate Partner Violence: Not At Risk (10/31/2023)   Received from [facility]   HITS    Over the last 12 months how often did your partner physically hurt you?: Never    Over the last 12 months how often did your partner insult you or talk down to you?: Never    Over the last 12 months how often did your partner threaten you with physical harm?: Never    Over the last 12 months how often did your partner scream or curse at you?: Never  Depression (PHQ2-9): Not on file  Alcohol Screen: Not on  file  Housing: Low Risk (02/19/2024)   Received from [facility]    In the last 12 months, was there a time when you were not able to pay the mortgage or rent on time?: No    In the past 12 months, how many times have you moved where you were living?: 0    At any time in the past 12 months, were you homeless or  living in a shelter (including now)?: No  Utilities: Not At Risk (02/19/2024)   Received from [facility]    In the past 12 months has the electric, gas, oil, or water company threatened to shut off services in your home?: No  Health Literacy: Not on file   Family History  Problem Relation Age of Onset   Breast cancer Mother    Cancer Father    Heart attack Brother    Heart attack Brother 42   BP (!) 120/91   Pulse 70   Wt 87.6 kg (193 lb 3.2 oz)   SpO2 99%   BMI 23.52 kg/m   Wt Readings from Last 3 Encounters:  03/04/24 87.6 kg (193 lb 3.2 oz)  01/30/24 87.5 kg (193 lb)  12/25/23 91.1 kg (200 lb 12.8 oz)   PHYSICAL EXAM: ReDs 35%, abnormal  GENERAL: NAD Lungs- clear  CARDIAC:  JVP: 7-8 cm          Irregularly irregular rhythm and rate. No MRG. No LEE  ABDOMEN: Soft, non-tender, non-distended.  EXTREMITIES: Warm and well perfused.  NEUROLOGIC: No obvious FND    ASSESSMENT & PLAN: 1. Chronic Systolic Heart Failure - NICM  - Initially diagnosed with HFrEF in 2012. EF previously improved to 50-55% on medications.  - Echo (6/24): EF now < 20%. He does have single chamber Biotronik.  - R/LHC (7/24) w/ minimal CAD, mildly elevated filling pressures and severely reduced CO/CI by thermo 4.3/1.9.  - Echo 11/24 EF < 20% -> shock - ICD interrogated 11/24 ->he developed AV block in 10/24 and RV pacing went up > 70% reculting in SR with long AV delay causing frequent paced beats and dropped beats as well.  - CRT upgrade 11/24 with a placement of an atrial lead  - Echo 05/08/23 LV markedly dilated EF 20-25% RV ok  - Echo 05/25: EF 20%, RV okay - Echo 12/25/23 EF 20-25% LV dilated RV normal  - NYHA II-early III, slightly worse  - ReDs 35%, Increase torsemide  to 40 mg daily x 2 days, then back to 20 mg daily  - Continue Jardiance  10 mg daily  - Continue Losartan  25 mg daily   - Continue digoxin  0.125 mg daily.  - Continue spironolactone  25 mg daily  - Check BMP and  digoxin  level today   2. Persistent A fib  - s/p DCCV to NSR 4/25 - Recurrent AF ~ 6 hrs on 05/25. Recurrent Afib detected in EP clinic 7/21. - DC-CV 10/02/23 - Recurrent AF on device since 11/30/23 - Saw EP 12/19/23 amio increased to 200 bid - 11/25 pt canceled DCCV - 12/25 device interrogation showed 100% afib burden  - Remains in AF on EKG today, V paced 70s. Pt only taking amio once daily  - increase amio back to 200 mg bid  - Will try DC-CV one more time. If fails, will pursue rate control. D/w pt, he is in agreement - Continue Eliquis  5 mg bid. No missed doses in last 30 days  3. CKD Stage IIIa - Baseline SCr 1.4-1.7 - Continue Jardiance  10 mg daily  - Check BMP today    4. ?RV Lead vegetation - possible that this is due to the curl-i-cue that the lead forms in the right heart, as noted on CXR.  - No vegetation on TEE 7/24   5. PVCs - High PVC burden. ~ 50% during prior admission.  - Now better suppressed. No PVCs on EKG today  - Continue mexilitene 200 bid.  - Follows with EP  F/u w/ APP in 1-2 wks post cardioversion. If fails cardioversion, will focus on rate control and can reduce amio back down to once daily    Caffie Shed, PA-C  12:19 PM  "

## 2024-03-04 NOTE — Patient Instructions (Signed)
 Medication Changes:  TAKE TORSEMIDE  40MG  DAILY FOR THE NEXT 2 DAYS AND THEN GO BACK TO 20MG  DAILY THEREAFTER   INCREASE AMIODARONE  TO 200MG  TWICE DAILY   Lab Work:  Labs done today, your results will be available in MyChart, we will contact you for abnormal readings.  Testing/Procedures:  Dear Austin [name]  You are scheduled for a Cardioversion on TUESDAY FEBRUARY 3RD  with Dr. Bensimhon.     Please arrive at the [facility] (Main Entrance A) at [facility]: [address] [place], [place] [address] at 8:30 AM (This time is 1 hour(s) before your procedure to ensure your preparation).    Free valet parking service is available. You will check in at ADMITTING.    *Please Note: You will receive a call the day before your procedure to confirm the appointment time. That time may have changed from the original time based on the schedule for that day.*    DIET:  Nothing to eat or drink after midnight except a sip of water with medications (see medication instructions below)   MEDICATION INSTRUCTIONS: !!IF ANY NEW MEDICATIONS ARE STARTED AFTER TODAY, PLEASE NOTIFY YOUR PROVIDER AS SOON AS POSSIBLE!!  FYI: Medications such as Semaglutide (Ozempic, Wegovy), Tirzepatide (Mounjaro, Zepbound), Dulaglutide (Trulicity), etc (GLP1 agonists) AND Canagliflozin (Invokana), Dapagliflozin  (Farxiga ), Empagliflozin  (Jardiance ), Ertugliflozin (Steglatro), Bexagliflozin [employer]) or any combination with one of these drugs such as Invokamet (Canagliflozin/Metformin), Synjardy (Empagliflozin /Metformin), etc (SGLT2 inhibitors) must be held around the time of a procedure. This is not a comprehensive list of all of these drugs. Please review all of your medications and talk to your provider if you take any one of these. If you are not sure, ask your provider.    HOLD: Empagliflozin  (Jardiance ) for 3 days prior to the procedure. Last dose on FRIDAY JANUARY 30TH.    Continue taking your anticoagulant  (blood thinner): Apixaban  (Eliquis ). DO NOT MISS ANY DOSES   LABS: DONE TODAY    FYI:  For your safety, and to allow us  to monitor your vital signs accurately during the surgery/procedure we request: If you have artificial nails, gel coating, SNS etc, please have those removed prior to your surgery/procedure. Not having the nail coverings /polish removed may result in cancellation or delay of your surgery/procedure.   Your support person will be asked to wait in the waiting room during your procedure.  It is OK to have someone drop you off and come back when you are ready to be discharged.  You cannot drive after the procedure and will need someone to drive you home.   Bring your insurance cards.   *Special Note: Every effort is made to have your procedure done on time. Occasionally there are emergencies that occur at the hospital that may cause delays. Please be patient if a delay does occur.   Follow-Up in: 2 WEEKS AFTER PROCEDURE AS SCHEDULED   At the [facility], you and your health needs are our priority. We have a designated team specialized in the treatment of Heart Failure. This Care Team includes your primary Heart Failure Specialized Cardiologist (physician), Advanced Practice Providers (APPs- Physician Assistants and Nurse Practitioners), and Pharmacist who all work together to provide you with the care you need, when you need it.   You may see any of the following providers on your designated Care Team at your next follow up:  Dr. Toribio Fuel Dr. Ezra Shuck Dr. Odis Brownie Greig Mosses, NP Caffie Shed, [place] Harlene Glena [place]  Manuelita Dutch, [place] Beckey Coe, NP Jordan Lee, NP Tinnie Redman, PharmD   Please be sure to bring in all your medications bottles to every appointment.   Need to Contact Us :  If you have any questions or concerns before your next appointment please send us  a message through [place] or call our office at [phone].    TO  LEAVE A MESSAGE FOR THE NURSE SELECT OPTION 2, PLEASE LEAVE A MESSAGE INCLUDING: YOUR NAME DATE OF BIRTH CALL BACK NUMBER REASON FOR CALL**this is important as we prioritize the call backs  YOU WILL RECEIVE A CALL BACK THE SAME DAY AS LONG AS YOU CALL BEFORE 4:00 PM

## 2024-03-04 NOTE — H&P (View-Only) (Signed)
 "  ADVANCED HF CLINIC NOTE   Primary Care: Nana Lauraine BRAVO, NP HF Cardiologist: Dr. Cherrie  Reason for Visit: f/u for chronic systolic heart failure   HPI: Austin [name] is a 69 y.o. old with a history of chronic HFrEF, HTN, HLD, nonobstructive CAD 2012, COPD, CKD Stage III, PAF, and single chamber Biotronik.    EF initially down 2012 to 20%. EF improved in 2013 to 50-55%.    Cath (2015): LCX 20% OMI, otherwise ok.  Echo (2022): EF 25-30% RV normal.  Echo (03/2022): EF 15-20% RV normal.    Admitted to [facility] Jan, Feb, March, April, and May of 2024 with A/C HFrEF. Noted to be in SR in March 2024.  Admitted 6/24 with a/c HF and AF. Echo showed EF < 20% RV normal. R/LHC showed minimal CAD, severe NICM EF 10%, mildly elevated filling pressures and severely reduced CI 1.9. AF was rate controlled, with frequent PVCs. Chemically converted to NSR on amio. Plan to repeat echo in 4-6 weeks after GDMT and PVC suppression, with eye toward LVAD if EF not improved.   Admitted in 11/24 with cardiogenic shock. Echo 11/24 EF < 20%. Treated with milrinone . Device interrogation showed that he developed AV block in 10/24 and RV pacing went up > 70%.  Underwent upgrade to CRT device with RA lead as well    Echo 3/25 EF 20-25% RV ok.  In rate controlled AF 3/25, underwent DCCV to NSR 05/19/23.   He was admitted 5/25  with shortness of breath with respiratory distress, abdominal pain and volume overload. Initially required BiPAP. Treated empirically for PNA. He was diuresed with IV lasix . He had CTA which showed markedly enlarged prostate. He required foley placement.   Last [facility] visit, his EKG showed wide QRS 184 ms, NSR. Given EF remained severely reduced despite BiV pacing, he was sent back to EP to see if device could be further optimized. At EP visit, 7/14, he was noted to be in afib. Plan was to restart amiodarone   Underwent DC-CV 10/02/23  Seen by EP 12/19/23 back in AF since 11/30/23 (asymptomatic).  Amio increased to 200 bid  Last AHF clinic visit 11/25, echo showed EF 20-25%, dilated LV, RV normal. EKG showed persistent AF w/ v pacing at 70. He was scheduled for repeat outpatient DCCV but pt canceled and never rescheduled.   He now returns back for f/u. Reports doing fairly well. 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 Getting round ok. No resting dyspnea, no CP but notes slight increase in exertional dyspnea, NYHA Class II-early III. No LEE. ReDs 35%.   EKG today shows persistent AF, V-paced 70 bpm. Reports full med compliance. No missed doses of eliquis .    Cardiac Studies - Echo 11/25; EF 20-25%, RV ok  - Echo 5/25 EF 20-25% RV ok   - TEE (7/24): LVEF < 20%, global HK, RV moderate HK, mild to moderate MR with posterior leaflet retracted, moderate TR, no LAA thrombus - R/LHC (6/24): minimal CAD, severe NICM EF 10% RA 9, PA 34/19 ( 27), PCWP 20 (v waves to 30), CO/CI (thermo) 4.3/1.9, PVR 1.6 - Echo (6/24): EF < 20%, echodensity on device in RV, RV moderately down, mild MR, ascending aorta 40 mm - Echo (2/24): EF 15-20% RV normal.  - Echo (2022): EF 25-30% RV normal.  - Cath (2015): LCX 20% OMI, otherwise ok.  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   Single Chamber Biotronik   Myocardial infarction [facility]) 2012   12/31/2013   Nonischemic cardiomyopathy (HCC) 11/15/2010   cath 12/10/10 minimal nonobstructive coronary artery disease   Olecranon bursitis    Squamous cell carcinoma of arm, 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     amiodarone  (PACERONE ) 200 MG tablet Take 1 tablet (200 mg total) by mouth daily. 30 tablet 3   apixaban  (ELIQUIS ) 5 MG TABS tablet Take 1 tablet (5 mg total) by mouth 2 (two) times daily. Start taking on 01/17/23 pm     digoxin  (LANOXIN ) 0.125 MG tablet Take 1 tablet (0.125 mg total) by mouth daily. 90 tablet 3   empagliflozin  (JARDIANCE ) 25 MG TABS tablet Take by mouth daily.     fenofibrate  (TRICOR ) 48 MG tablet Take 2 tablets by mouth Daily     ferrous sulfate  325 (65 FE) MG tablet Take 1 tablet (325 mg total) by mouth every Monday, Wednesday, and Friday. In the morning 30 tablet 11   losartan  (COZAAR ) 25 MG tablet Take 1 tablet (25 mg total) by mouth daily. 90 tablet 3   melatonin 3 MG TABS tablet Take 6 mg by mouth at bedtime as needed (sleep).     mexiletine (MEXITIL ) 200 MG capsule Take 1 capsule (200 mg total) by mouth every 12 (twelve) hours. 60 capsule 3   omeprazole  (PRILOSEC) 20 MG capsule Take 1 capsule (20 mg total) by mouth daily. 90 capsule 3   polyethylene glycol (MIRALAX  / GLYCOLAX ) 17 g packet Take 17 g by mouth daily as needed (constipation.).     potassium chloride  SA (KLOR-CON  M) 20 MEQ tablet Take 2 tablets (40 mEq total) by mouth daily. 180 tablet 3   spironolactone  (ALDACTONE ) 25 MG tablet Take 1 tablet (25 mg total) by mouth daily. 30 tablet 0   tamsulosin  (FLOMAX ) 0.4 MG CAPS capsule Take 1 capsule (0.4 mg total) by mouth daily after supper. 30 capsule 0   torsemide  (DEMADEX ) 20 MG tablet Take 1 tablet (20 mg total) by mouth daily. 90 tablet 3   No current facility-administered medications for this encounter.   Allergies   Allergen Reactions   Lisinopril  Cough   Social History   Socioeconomic History   Marital status: Single    Spouse name: Not on file   Number of children: Not on file   Years of education: Not on file   Highest education level: Not on file  Occupational History   Not on file  Tobacco Use   Smoking status: Former    Current packs/day: 0.00    Average packs/day: 2.0 packs/day for 14.0 years (28.0 ttl pk-yrs)    Types: Cigarettes    Start date: 02/15/1979  Quit date: 02/14/1993    Years since quitting: 31.0   Smokeless tobacco: Not on file   Tobacco comments:    Former smoker 03/20/23  Substance and Sexual Activity   Alcohol use: Yes    Alcohol/week: 3.0 standard drinks of alcohol    Types: 1 Cans of beer, 2 Shots of liquor per week    Comment: once a month a beer or couple shots of bourbon 03/20/23   Drug use: Not Currently    Types: Marijuana    Comment: occ smokes marjuana 03/20/23   Sexual activity: Not on file  Other Topics Concern   Not on file  Social History Narrative   He is single and lives in [place].  He drives a refrigerator truck.   Social Drivers of Health   Tobacco Use: Medium Risk (03/04/2024)   Patient History    Smoking Tobacco Use: Former    Smokeless Tobacco Use: Unknown    Passive Exposure: Not on file  Financial Resource Strain: Low Risk (02/19/2024)   Received from [facility]   Overall Financial Resource Strain (CARDIA)    How hard is it for you to pay for the very basics like food, housing, medical care, and heating?: Not hard at all  Food Insecurity: No Food Insecurity (02/19/2024)   Received from [facility]   Epic    Within the past 12 months, you worried that your food would run out before you got the money to buy more.: Never true    Within the past 12 months, the food you bought just didn't last and you didn't have money to get more.: Never true  Transportation Needs: No Transportation Needs (02/19/2024)   Received from [facility]    In the past 12 months, has lack of transportation kept you from medical appointments or from getting medications?: No    In the past 12 months, has lack of transportation kept you from meetings, work, or from getting things needed for daily living?: No  Physical Activity: Insufficiently Active (10/31/2023)   Received from [facility]   Exercise Vital Sign    On average, how many days per week do you engage in moderate to strenuous exercise (like a brisk walk)?: 3 days    On average, how many minutes do you engage in exercise at this level?: 20 min  Stress: No Stress Concern Present (10/31/2023)   Received from [facility] Occupational [facility] - Occupational Stress Questionnaire    Do you feel stress - tense, restless, nervous, or anxious, or unable to sleep at night because your mind is troubled all the time - these days?: Only a little  Social Connections: Socially Integrated (10/31/2023)   Received from [facility]   Social Network    How would you rate your social network (family, work, friends)?: Good participation with social networks  Intimate Partner Violence: Not At Risk (10/31/2023)   Received from [facility]   HITS    Over the last 12 months how often did your partner physically hurt you?: Never    Over the last 12 months how often did your partner insult you or talk down to you?: Never    Over the last 12 months how often did your partner threaten you with physical harm?: Never    Over the last 12 months how often did your partner scream or curse at you?: Never  Depression (PHQ2-9): Not on file  Alcohol Screen: Not on  file  Housing: Low Risk (02/19/2024)   Received from [facility]    In the last 12 months, was there a time when you were not able to pay the mortgage or rent on time?: No    In the past 12 months, how many times have you moved where you were living?: 0    At any time in the past 12 months, were you homeless or  living in a shelter (including now)?: No  Utilities: Not At Risk (02/19/2024)   Received from [facility]    In the past 12 months has the electric, gas, oil, or water company threatened to shut off services in your home?: No  Health Literacy: Not on file   Family History  Problem Relation Age of Onset   Breast cancer Mother    Cancer Father    Heart attack Brother    Heart attack Brother 42   BP (!) 120/91   Pulse 70   Wt 87.6 kg (193 lb 3.2 oz)   SpO2 99%   BMI 23.52 kg/m   Wt Readings from Last 3 Encounters:  03/04/24 87.6 kg (193 lb 3.2 oz)  01/30/24 87.5 kg (193 lb)  12/25/23 91.1 kg (200 lb 12.8 oz)   PHYSICAL EXAM: ReDs 35%, abnormal  GENERAL: NAD Lungs- clear  CARDIAC:  JVP: 7-8 cm          Irregularly irregular rhythm and rate. No MRG. No LEE  ABDOMEN: Soft, non-tender, non-distended.  EXTREMITIES: Warm and well perfused.  NEUROLOGIC: No obvious FND    ASSESSMENT & PLAN: 1. Chronic Systolic Heart Failure - NICM  - Initially diagnosed with HFrEF in 2012. EF previously improved to 50-55% on medications.  - Echo (6/24): EF now < 20%. He does have single chamber Biotronik.  - R/LHC (7/24) w/ minimal CAD, mildly elevated filling pressures and severely reduced CO/CI by thermo 4.3/1.9.  - Echo 11/24 EF < 20% -> shock - ICD interrogated 11/24 ->he developed AV block in 10/24 and RV pacing went up > 70% reculting in SR with long AV delay causing frequent paced beats and dropped beats as well.  - CRT upgrade 11/24 with a placement of an atrial lead  - Echo 05/08/23 LV markedly dilated EF 20-25% RV ok  - Echo 05/25: EF 20%, RV okay - Echo 12/25/23 EF 20-25% LV dilated RV normal  - NYHA II-early III, slightly worse  - ReDs 35%, Increase torsemide  to 40 mg daily x 2 days, then back to 20 mg daily  - Continue Jardiance  10 mg daily  - Continue Losartan  25 mg daily   - Continue digoxin  0.125 mg daily.  - Continue spironolactone  25 mg daily  - Check BMP and  digoxin  level today   2. Persistent A fib  - s/p DCCV to NSR 4/25 - Recurrent AF ~ 6 hrs on 05/25. Recurrent Afib detected in EP clinic 7/21. - DC-CV 10/02/23 - Recurrent AF on device since 11/30/23 - Saw EP 12/19/23 amio increased to 200 bid - 11/25 pt canceled DCCV - 12/25 device interrogation showed 100% afib burden  - Remains in AF on EKG today, V paced 70s. Pt only taking amio once daily  - increase amio back to 200 mg bid  - Will try DC-CV one more time. If fails, will pursue rate control. D/w pt, he is in agreement - Continue Eliquis  5 mg bid. No missed doses in last 30 days  3. CKD Stage IIIa - Baseline SCr 1.4-1.7 - Continue Jardiance  10 mg daily  - Check BMP today    4. ?RV Lead vegetation - possible that this is due to the curl-i-cue that the lead forms in the right heart, as noted on CXR.  - No vegetation on TEE 7/24   5. PVCs - High PVC burden. ~ 50% during prior admission.  - Now better suppressed. No PVCs on EKG today  - Continue mexilitene 200 bid.  - Follows with EP  F/u w/ APP in 1-2 wks post cardioversion. If fails cardioversion, will focus on rate control and can reduce amio back down to once daily    Caffie Shed, PA-C  12:19 PM  "

## 2024-03-05 ENCOUNTER — Other Ambulatory Visit (HOSPITAL_COMMUNITY): Payer: Self-pay

## 2024-03-05 DIAGNOSIS — I4819 Other persistent atrial fibrillation: Secondary | ICD-10-CM

## 2024-03-05 NOTE — Progress Notes (Signed)
Orders placed for DCCV

## 2024-03-09 ENCOUNTER — Other Ambulatory Visit: Payer: Self-pay

## 2024-03-09 ENCOUNTER — Emergency Department (HOSPITAL_COMMUNITY)
Admission: EM | Admit: 2024-03-09 | Discharge: 2024-03-09 | Disposition: A | Discharge status: Home / Self Care | Attending: Emergency Medicine | Admitting: Emergency Medicine

## 2024-03-09 ENCOUNTER — Encounter (HOSPITAL_COMMUNITY): Payer: Self-pay

## 2024-03-09 DIAGNOSIS — I4891 Unspecified atrial fibrillation: Secondary | ICD-10-CM | POA: Diagnosis not present

## 2024-03-09 DIAGNOSIS — N1832 Chronic kidney disease, stage 3b: Secondary | ICD-10-CM | POA: Diagnosis not present

## 2024-03-09 DIAGNOSIS — I509 Heart failure, unspecified: Secondary | ICD-10-CM | POA: Diagnosis not present

## 2024-03-09 DIAGNOSIS — Z7901 Long term (current) use of anticoagulants: Secondary | ICD-10-CM | POA: Diagnosis not present

## 2024-03-09 DIAGNOSIS — E875 Hyperkalemia: Secondary | ICD-10-CM | POA: Insufficient documentation

## 2024-03-09 DIAGNOSIS — J449 Chronic obstructive pulmonary disease, unspecified: Secondary | ICD-10-CM | POA: Diagnosis not present

## 2024-03-09 LAB — I-STAT CHEM 8, ED
BUN: 29 mg/dL — ABNORMAL HIGH (ref 8–23)
Calcium, Ion: 1.18 mmol/L (ref 1.15–1.40)
Chloride: 105 mmol/L (ref 98–111)
Creatinine, Ser: 1.9 mg/dL — ABNORMAL HIGH (ref 0.61–1.24)
Glucose, Bld: 153 mg/dL — ABNORMAL HIGH (ref 70–99)
HCT: 39 % (ref 39.0–52.0)
Hemoglobin: 13.3 g/dL (ref 13.0–17.0)
Potassium: 5 mmol/L (ref 3.5–5.1)
Sodium: 139 mmol/L (ref 135–145)
TCO2: 25 mmol/L (ref 22–32)

## 2024-03-09 LAB — CBC
HCT: 38.4 % — ABNORMAL LOW (ref 39.0–52.0)
Hemoglobin: 11.4 g/dL — ABNORMAL LOW (ref 13.0–17.0)
MCH: 24.7 pg — ABNORMAL LOW (ref 26.0–34.0)
MCHC: 29.7 g/dL — ABNORMAL LOW (ref 30.0–36.0)
MCV: 83.3 fL (ref 80.0–100.0)
Platelets: 192 10*3/uL (ref 150–400)
RBC: 4.61 MIL/uL (ref 4.22–5.81)
RDW: 17.1 % — ABNORMAL HIGH (ref 11.5–15.5)
WBC: 7 10*3/uL (ref 4.0–10.5)
nRBC: 0 % (ref 0.0–0.2)

## 2024-03-09 LAB — COMPREHENSIVE METABOLIC PANEL WITH GFR
ALT: 14 U/L (ref 0–44)
AST: 20 U/L (ref 15–41)
Albumin: 4.3 g/dL (ref 3.5–5.0)
Alkaline Phosphatase: 58 U/L (ref 38–126)
Anion gap: 9 (ref 5–15)
BUN: 27 mg/dL — ABNORMAL HIGH (ref 8–23)
CO2: 25 mmol/L (ref 22–32)
Calcium: 9.3 mg/dL (ref 8.9–10.3)
Chloride: 104 mmol/L (ref 98–111)
Creatinine, Ser: 1.77 mg/dL — ABNORMAL HIGH (ref 0.61–1.24)
GFR, Estimated: 41 mL/min — ABNORMAL LOW
Glucose, Bld: 154 mg/dL — ABNORMAL HIGH (ref 70–99)
Potassium: 5.2 mmol/L — ABNORMAL HIGH (ref 3.5–5.1)
Sodium: 138 mmol/L (ref 135–145)
Total Bilirubin: 0.8 mg/dL (ref 0.0–1.2)
Total Protein: 6.4 g/dL — ABNORMAL LOW (ref 6.5–8.1)

## 2024-03-09 MED ORDER — SODIUM ZIRCONIUM CYCLOSILICATE 5 G PO PACK
5.0000 g | PACK | Freq: Once | ORAL | Status: AC
  Administered 2024-03-09: 5 g via ORAL
  Filled 2024-03-09: qty 1

## 2024-03-09 NOTE — Discharge Instructions (Signed)
 Has follow-up with the heart failure clinic on Monday to have labs rechecked to ensure that your potassium has normalized.  Your potassium was 5.2 today which is just mildly elevated.  You were given a medicine here called Lokelma .  Until you follow-up with cardiology I would recommend stopping your potassium supplementation.  Come back to ER with new or worsening symptoms.

## 2024-03-09 NOTE — ED Provider Notes (Signed)
 " [place] EMERGENCY DEPARTMENT AT [facility] Provider Note   CSN: [id number] Arrival date & time: 03/09/24  1221     Patient presents with: Abnormal Lab   Austin [name] is a 69 y.o. male.  Patient with past medical history of congestive heart failure, A-fib on Eliquis  and amiodarone , chronic kidney disease stage IIIb, ICD, COPD presents to emergency room with complaint of elevated potassium.  On the 19th he was found to have potassium of 5.7.  He is on potassium supplementation as it has historically been low.  Patient denies any symptoms.    Abnormal Lab      Prior to Admission medications  Medication Sig Start Date End Date Taking? Authorizing Provider  acetaminophen  (TYLENOL ) 325 MG tablet Take 2 tablets (650 mg total) by mouth every 4 (four) hours as needed for moderate pain (pain score 4-6). 01/14/23   Arrien, Mauricio Daniel, MD  amiodarone  (PACERONE ) 200 MG tablet Take 1 tablet (200 mg total) by mouth 2 (two) times daily. 03/04/24   Marcine Catalan M, PA-C  apixaban  (ELIQUIS ) 5 MG TABS tablet Take 1 tablet (5 mg total) by mouth 2 (two) times daily. Start taking on 01/17/23 pm 01/14/23   Arrien, Elidia Sieving, MD  digoxin  (LANOXIN ) 0.125 MG tablet Take 1 tablet (0.125 mg total) by mouth daily. 07/25/23   Colletta Manuelita Garre, PA-C  empagliflozin  (JARDIANCE ) 25 MG TABS tablet Take by mouth daily.    [provider]  fenofibrate  (TRICOR ) 48 MG tablet Take 2 tablets by mouth Daily    [provider]  ferrous sulfate  325 (65 FE) MG tablet Take 1 tablet (325 mg total) by mouth every Monday, Wednesday, and Friday. In the morning 04/07/23   Bensimhon, Sieving SAUNDERS, MD  losartan  (COZAAR ) 25 MG tablet Take 1 tablet (25 mg total) by mouth daily. 07/25/23 03/04/24  Colletta Manuelita Garre, PA-C  melatonin 3 MG TABS tablet Take 6 mg by mouth at bedtime as needed (sleep). 02/03/22   [provider]  mexiletine (MEXITIL ) 200 MG capsule Take 1 capsule (200  mg total) by mouth every 12 (twelve) hours. 01/30/23   Bensimhon, Sieving SAUNDERS, MD  omeprazole  (PRILOSEC) 20 MG capsule Take 1 capsule (20 mg total) by mouth daily. 01/30/23   Bensimhon, Daniel R, MD  polyethylene glycol (MIRALAX  / GLYCOLAX ) 17 g packet Take 17 g by mouth daily as needed (constipation.). 07/14/23   Cheryle Page, MD  potassium chloride  SA (KLOR-CON  M) 20 MEQ tablet Take 2 tablets (40 mEq total) by mouth daily. 07/25/23   Colletta Manuelita Garre, PA-C  spironolactone  (ALDACTONE ) 25 MG tablet Take 1 tablet (25 mg total) by mouth daily. 07/14/23   Cheryle Page, MD  tamsulosin  (FLOMAX ) 0.4 MG CAPS capsule Take 1 capsule (0.4 mg total) by mouth daily after supper. 07/14/23   Cheryle Page, MD  torsemide  (DEMADEX ) 20 MG tablet Take 1 tablet (20 mg total) by mouth daily. 07/25/23   Colletta Manuelita Garre, PA-C    Allergies: Lisinopril     Review of Systems  Constitutional:  Positive for activity change.    Updated Vital Signs BP 95/74   Pulse 70   Temp (!) 97.4 F (36.3 C) (Oral)   Resp 12   SpO2 100%   Physical Exam Vitals and nursing note reviewed.  Constitutional:      General: He is not in acute distress.    Appearance: He is not toxic-appearing.  HENT:     Head: Normocephalic and atraumatic.  Eyes:  General: No scleral icterus.    Conjunctiva/sclera: Conjunctivae normal.  Cardiovascular:     Rate and Rhythm: Normal rate and regular rhythm.     Pulses: Normal pulses.     Heart sounds: Normal heart sounds.  Pulmonary:     Effort: Pulmonary effort is normal. No respiratory distress.     Breath sounds: Normal breath sounds.  Abdominal:     General: Abdomen is flat. Bowel sounds are normal.     Palpations: Abdomen is soft.     Tenderness: There is no abdominal tenderness.  Musculoskeletal:     Right lower leg: No edema.     Left lower leg: No edema.  Skin:    General: Skin is warm and dry.     Findings: No lesion.  Neurological:     General: No focal deficit  present.     Mental Status: He is alert and oriented to person, place, and time. Mental status is at baseline.     (all labs ordered are listed, but only abnormal results are displayed) Labs Reviewed  CBC - Abnormal; Notable for the following components:      Result Value   Hemoglobin 11.4 (*)    HCT 38.4 (*)    MCH 24.7 (*)    MCHC 29.7 (*)    RDW 17.1 (*)    All other components within normal limits  COMPREHENSIVE METABOLIC PANEL WITH GFR - Abnormal; Notable for the following components:   Potassium 5.2 (*)    Glucose, Bld 154 (*)    BUN 27 (*)    Creatinine, Ser 1.77 (*)    Total Protein 6.4 (*)    GFR, Estimated 41 (*)    All other components within normal limits  I-STAT CHEM 8, ED - Abnormal; Notable for the following components:   BUN 29 (*)    Creatinine, Ser 1.90 (*)    Glucose, Bld 153 (*)    All other components within normal limits    EKG: None  Radiology: No results found.   Procedures   Medications Ordered in the ED - No data to display                                  Medical Decision Making Risk Prescription drug management.   This patient presents to the ED for concern of abnormal lab, this involves an extensive number of treatment options, and is a complaint that carries with it a high risk of complications and morbidity.  The differential diagnosis includes electrolyte abnormality, lab error, medication side effect   Lab Tests:  I personally interpreted labs.  The pertinent results include:   CBC with baseline stable hemoglobin CMP shows baseline creatinine at 1.7 and elevated potassium at 5.2    Problem List / ED Course / Critical interventions / Medication management  Patient reports to ER with complaint of abnormal lab. Patient is stable and well appearing. Reports he is feeling very well and has no symptoms with this. Has been taking Redick as prescribed.  Has been taking 40 of supplemental oral potassium daily.  He was found to have  baseline kidney function and potassium of 5.2.  This has begun to improve since prior recent labs.  Patient was given Lokelma .  I will have him hold his potassium supplementation until he can follow-up with cardiology for recheck.  Patient is requesting discharge home and I feel this is appropriate as  he is not having any symptoms and his vital signs have been stable. I ordered medication including Lokelma  for elevated K Reevaluation of the patient after these medicines showed that the patient improved I have reviewed the patients home medicines and have made adjustments as needed. Patient was given follow-up instructions and return precautions.        Final diagnoses:  Hyperkalemia    ED Discharge Orders     None          Shermon Warren SAILOR, PA-C 03/09/24 1431    Tegeler, Lonni PARAS, MD 03/09/24 1528  "

## 2024-03-09 NOTE — ED Triage Notes (Signed)
 Pt sent by cardiologist for hyperkalemia. Pt potassium was 5.7 on 1/19. Pt denies any sx.

## 2024-03-09 NOTE — ED Provider Triage Note (Signed)
 Emergency Medicine Provider Triage Evaluation Note  Austin [name] , a 69 y.o. male  was evaluated in triage.  Pt complains of high potassium with cardiology from labs drawn on 1/19.  Potassium 5.7.  He denies any chest pain, shortness of breath, palpitations.  Reports that if he had not had a call from Dr. He would not have come to the emergency department.  Reports that he has been feeling well..  Review of Systems  Positive: High potassium Negative: Cp, palpitations  Physical Exam  There were no vitals taken for this visit. Gen:   Awake, no distress   Resp:  Normal effort  MSK:   Moves extremities without difficulty  Other:    Medical Decision Making  Medically screening exam initiated at 12:33 PM.  Appropriate orders placed.  [name] was informed that the remainder of the evaluation will be completed by another provider, this initial triage assessment does not replace that evaluation, and the importance of remaining in the ED until their evaluation is complete.  Workup initiated in triage    Rosan Sherlean DEL, [place] 03/09/24 1233

## 2024-03-09 NOTE — ED Triage Notes (Signed)
 PT sent by Dr. Agustin office for elevated Potassium.  Pt is a bit of HOH.

## 2024-03-18 ENCOUNTER — Telehealth (HOSPITAL_COMMUNITY): Payer: Self-pay

## 2024-03-18 NOTE — Telephone Encounter (Signed)
 Spoke to patient regarding procedure scheduled for tomorrow. Patient states he has been out of mexiletine for awhile. Dr. Bensimhon made aware and patient can proceed along as taking amiodarone  and eliquis .

## 2024-03-19 ENCOUNTER — Encounter (HOSPITAL_COMMUNITY)
Admission: RE | Disposition: A | Discharge status: Home / Self Care | Payer: Self-pay | Source: Home / Self Care | Attending: Internal Medicine

## 2024-03-19 ENCOUNTER — Encounter (HOSPITAL_COMMUNITY): Payer: Self-pay | Admitting: Internal Medicine

## 2024-03-19 ENCOUNTER — Encounter (HOSPITAL_COMMUNITY): Payer: Self-pay | Admitting: Certified Registered Nurse Anesthetist

## 2024-03-19 ENCOUNTER — Other Ambulatory Visit: Payer: Self-pay

## 2024-03-19 ENCOUNTER — Ambulatory Visit (HOSPITAL_COMMUNITY)
Admission: RE | Admit: 2024-03-19 | Discharge: 2024-03-19 | Disposition: A | Attending: Internal Medicine | Admitting: Internal Medicine

## 2024-03-19 DIAGNOSIS — N1831 Chronic kidney disease, stage 3a: Secondary | ICD-10-CM | POA: Insufficient documentation

## 2024-03-19 DIAGNOSIS — J439 Emphysema, unspecified: Secondary | ICD-10-CM | POA: Insufficient documentation

## 2024-03-19 DIAGNOSIS — I2510 Atherosclerotic heart disease of native coronary artery without angina pectoris: Secondary | ICD-10-CM | POA: Insufficient documentation

## 2024-03-19 DIAGNOSIS — I130 Hypertensive heart and chronic kidney disease with heart failure and stage 1 through stage 4 chronic kidney disease, or unspecified chronic kidney disease: Secondary | ICD-10-CM | POA: Insufficient documentation

## 2024-03-19 DIAGNOSIS — Z87891 Personal history of nicotine dependence: Secondary | ICD-10-CM | POA: Insufficient documentation

## 2024-03-19 DIAGNOSIS — Z7901 Long term (current) use of anticoagulants: Secondary | ICD-10-CM | POA: Insufficient documentation

## 2024-03-19 DIAGNOSIS — Z95810 Presence of automatic (implantable) cardiac defibrillator: Secondary | ICD-10-CM | POA: Insufficient documentation

## 2024-03-19 DIAGNOSIS — Z8249 Family history of ischemic heart disease and other diseases of the circulatory system: Secondary | ICD-10-CM | POA: Insufficient documentation

## 2024-03-19 DIAGNOSIS — E785 Hyperlipidemia, unspecified: Secondary | ICD-10-CM | POA: Insufficient documentation

## 2024-03-19 DIAGNOSIS — I5022 Chronic systolic (congestive) heart failure: Secondary | ICD-10-CM | POA: Insufficient documentation

## 2024-03-19 DIAGNOSIS — I493 Ventricular premature depolarization: Secondary | ICD-10-CM | POA: Insufficient documentation

## 2024-03-19 DIAGNOSIS — Z79899 Other long term (current) drug therapy: Secondary | ICD-10-CM | POA: Insufficient documentation

## 2024-03-19 DIAGNOSIS — I4819 Other persistent atrial fibrillation: Secondary | ICD-10-CM | POA: Insufficient documentation

## 2024-03-19 DIAGNOSIS — I428 Other cardiomyopathies: Secondary | ICD-10-CM | POA: Insufficient documentation

## 2024-03-19 LAB — POCT I-STAT, CHEM 8
BUN: 35 mg/dL — ABNORMAL HIGH (ref 8–23)
Calcium, Ion: 1.34 mmol/L (ref 1.15–1.40)
Chloride: 107 mmol/L (ref 98–111)
Creatinine, Ser: 2 mg/dL — ABNORMAL HIGH (ref 0.61–1.24)
Glucose, Bld: 94 mg/dL (ref 70–99)
HCT: 35 % — ABNORMAL LOW (ref 39.0–52.0)
Hemoglobin: 11.9 g/dL — ABNORMAL LOW (ref 13.0–17.0)
Potassium: 4.2 mmol/L (ref 3.5–5.1)
Sodium: 142 mmol/L (ref 135–145)
TCO2: 25 mmol/L (ref 22–32)

## 2024-03-19 MED ORDER — PROPOFOL 10 MG/ML IV BOLUS
INTRAVENOUS | Status: DC | PRN
  Administered 2024-03-19 (×3): 30 mg via INTRAVENOUS

## 2024-03-19 MED ORDER — SODIUM CHLORIDE 0.9 % IV SOLN: INTRAVENOUS | Status: DC

## 2024-03-19 NOTE — Interval H&P Note (Signed)
 History and Physical Interval Note:  03/19/2024 9:55 AM  Austin [name]  has presented today for surgery, with the diagnosis of AFIB.  The various methods of treatment have been discussed with the patient and family. After consideration of risks, benefits and other options for treatment, the patient has consented to  Procedures: CARDIOVERSION (N/A) as a surgical intervention.  The patient's history has been reviewed, patient examined, no change in status, stable for surgery.  I have reviewed the patient's chart and labs.  Questions were answered to the patient's satisfaction.     [name]

## 2024-03-19 NOTE — CV Procedure (Signed)
" ° °   DIRECT CURRENT CARDIOVERSION  NAME:  Austin [name]   MRN: [id number] DOB:  October 03, 1955   ADMIT DATE: 03/19/2024   INDICATIONS: Atrial fibrillation    PROCEDURE:   Informed consent was obtained prior to the procedure. The risks, benefits and alternatives for the procedure were discussed and the patient comprehended these risks. Once an appropriate time out was taken, the patient had the defibrillator pads placed in the anterior and posterior position. The patient then underwent sedation by the anesthesia service. Once an appropriate level of sedation was achieved, the patient received a single biphasic, synchronized 200J shock with prompt conversion to sinus rhythm. No apparent complications.  Toribio Fuel, MD  9:55 AM  "

## 2024-03-19 NOTE — Transfer of Care (Signed)
 Immediate Anesthesia Transfer of Care Note  Patient: Austin [name]  Procedure(s) Performed: CARDIOVERSION  Patient Location: PACU and Cath Lab  Anesthesia Type:General  Level of Consciousness: awake and alert   Airway & Oxygen Therapy: Patient Spontanous Breathing and Patient connected to nasal cannula oxygen  Post-op Assessment: Report given to RN and Post -op Vital signs reviewed and stable  Post vital signs: Reviewed and stable  Last Vitals:  Vitals Value Taken Time  BP 99/70 03/19/24 09:58  Temp    Pulse 60 03/19/24 09:58  Resp 20 03/19/24 09:58  SpO2 98 % 03/19/24 09:58    Last Pain: There were no vitals filed for this visit.       Complications: No notable events documented.

## 2024-03-19 NOTE — Discharge Instructions (Signed)
 Electrical Cardioversion Electrical cardioversion is the delivery of a jolt of electricity to restore a normal rhythm to the heart. A rhythm that is too fast or is not regular keeps the heart from pumping well. In this procedure, sticky patches or metal paddles are placed on the chest to deliver electricity to the heart from a device. This procedure may be done in an emergency if: There is low or no blood pressure as a result of the heart rhythm. Normal rhythm must be restored as fast as possible to protect the brain and heart from further damage. It may save a life. This may also be a scheduled procedure for irregular or fast heart rhythms that are not immediately life-threatening.  What can I expect after the procedure? Your blood pressure, heart rate, breathing rate, and blood oxygen level will be monitored until you leave the hospital or clinic. Your heart rhythm will be watched to make sure it does not change. You may have some redness on the skin where the shocks were given. Over the counter cortizone cream may be helpful.  Follow these instructions at home: Do not drive for 24 hours if you were given a sedative during your procedure. Take over-the-counter and prescription medicines only as told by your health care provider. Ask your health care provider how to check your pulse. Check it often. Rest for 48 hours after the procedure or as told by your health care provider. Avoid or limit your caffeine use as told by your health care provider. Keep all follow-up visits as told by your health care provider. This is important. Contact a health care provider if: You feel like your heart is beating too quickly or your pulse is not regular. You have a serious muscle cramp that does not go away. Get help right away if: You have discomfort in your chest. You are dizzy or you feel faint. You have trouble breathing or you are short of breath. Your speech is slurred. You have trouble moving an  arm or leg on one side of your body. Your fingers or toes turn cold or blue. Summary Electrical cardioversion is the delivery of a jolt of electricity to restore a normal rhythm to the heart. This procedure may be done right away in an emergency or may be a scheduled procedure if the condition is not an emergency. Generally, this is a safe procedure. After the procedure, check your pulse often as told by your health care provider. This information is not intended to replace advice given to you by your health care provider. Make sure you discuss any questions you have with your health care provider. Document Revised: 09/03/2018 Document Reviewed: 09/03/2018 Elsevier Patient EducatiElectrical Cardioversion Electrical cardioversion is the delivery of a jolt of electricity to restore a normal rhythm to the heart. A rhythm that is too fast or is not regular (arrhythmia) keeps the heart from pumping blood well. There is also another type of cardioversion called a chemical (pharmacologic) cardioversion. This is when your health care provider gives you one or more medicines to bring back your regular heart rhythm. Electrical cardioversion is done as a scheduled procedure for arrhythmiasthat are not life-threatening. Electrical cardioversion may also be done in an emergency for sudden life-threatening arrhythmias. Tell a health care provider about: Any allergies you have. All medicines you are taking, including vitamins, herbs, eye drops, creams, and over-the-counter medicines. Any problems you or family members have had with sedatives or anesthesia. Any bleeding problems you have. Any surgeries you  have had, including a pacemaker, defibrillator, or other implanted device. Any medical conditions you have. Whether you are pregnant or may be pregnant. What are the risks? Your provider will talk with you about risks. These include: Allergic reactions to medicines. Irritation to the skin on your chest or  back where the sticky pads (electrodes) or paddles were put during electrical cardioversion. A blood clot that breaks free and travels to other parts of your body, such as your brain. Return of a worse abnormal heart rhythm that will need to be treated with medicines, a pacemaker, or an implantable cardioverter defibrillator (ICD). What happens before the procedure? Medicines Your provider may give you: Blood-thinning medicines (anticoagulants) so your blood does not clot as easily. If your provider gives you this medicine, you may need to take it for 4 weeks before the procedure. Medicines to help stabilize your heart rate and rhythm. Ask your provider about: Changing or stopping your regular medicines. These include any diabetes medicines or blood thinners you take. Taking medicines such as aspirin and ibuprofen. These medicines can thin your blood. Do not take them unless your provider tells you to. Taking over-the-counter medicines, vitamins, herbs, and supplements. General instructions Follow instructions from your provider about what you may eat and drink. Do not put any lotions, powders, or ointments on your chest and back for 24 hours before the procedure. They can cause problems with the electrodes or paddles used to deliver electricity to your heart. Do not wear jewelry as this can interfere with delivering electricity to your heart. If you will be going home right after the procedure, plan to have a responsible adult: Take you home from the hospital or clinic. You will not be allowed to drive. Care for you for the time you are told. Tests You may have an exam or testing. This may include: Blood labs. A transesophageal echocardiogram (TEE). What happens during the procedure?     An IV will be inserted into one of your veins. You will be given a sedative. This helps you relax. Electrodes or metal paddles will be placed on your chest. They may be placed in one of these ways: One  placed on your right chest, the other on the left ribs. One placed on your chest and the other on your back. An electrical shock will be delivered. The shock briefly stops (resets) your heart rhythm. Your provider will check to see if your heart rhythm is now normal. Some people need only one shock. Some need more to restore a normal heart rhythm. The procedure may vary among providers and hospitals. What happens after the procedure? Your blood pressure, heart rate, breathing rate, and blood oxygen level will be monitored until you leave the hospital or clinic. Your heart rhythm will be watched to make sure it does not change. This information is not intended to replace advice given to you by your health care provider. Make sure you discuss any questions you have with your health care provider. Document Revised: 09/23/2021 Document Reviewed: 09/23/2021 Else[employer] Patient Education  2024 [employer]  2020 [employer].

## 2024-03-25 ENCOUNTER — Ambulatory Visit: Admitting: Physician Assistant

## 2024-04-02 ENCOUNTER — Ambulatory Visit (HOSPITAL_COMMUNITY)
